# Patient Record
Sex: Female | Born: 1935 | Race: White | Hispanic: No | Marital: Married | Smoking: Never smoker
Health system: Southern US, Community
[De-identification: ages and names within clinical notes are randomized; demographics above are authoritative.]

## PROBLEM LIST (undated history)

## (undated) DIAGNOSIS — M545 Low back pain, unspecified: Secondary | ICD-10-CM

## (undated) DIAGNOSIS — D649 Anemia, unspecified: Secondary | ICD-10-CM

## (undated) DIAGNOSIS — I10 Essential (primary) hypertension: Secondary | ICD-10-CM

## (undated) DIAGNOSIS — F32A Depression, unspecified: Secondary | ICD-10-CM

## (undated) DIAGNOSIS — M1990 Unspecified osteoarthritis, unspecified site: Secondary | ICD-10-CM

## (undated) DIAGNOSIS — F419 Anxiety disorder, unspecified: Secondary | ICD-10-CM

## (undated) DIAGNOSIS — Z9289 Personal history of other medical treatment: Secondary | ICD-10-CM

## (undated) DIAGNOSIS — G8929 Other chronic pain: Secondary | ICD-10-CM

## (undated) DIAGNOSIS — K219 Gastro-esophageal reflux disease without esophagitis: Secondary | ICD-10-CM

## (undated) DIAGNOSIS — IMO0001 Reserved for inherently not codable concepts without codable children: Secondary | ICD-10-CM

## (undated) DIAGNOSIS — F329 Major depressive disorder, single episode, unspecified: Secondary | ICD-10-CM

## (undated) DIAGNOSIS — C801 Malignant (primary) neoplasm, unspecified: Secondary | ICD-10-CM

## (undated) HISTORY — PX: DILATION AND CURETTAGE OF UTERUS: SHX78

## (undated) HISTORY — PX: TONSILLECTOMY: SUR1361

## (undated) HISTORY — PX: JOINT REPLACEMENT: SHX530

## (undated) HISTORY — PX: OTHER SURGICAL HISTORY: SHX169

## (undated) HISTORY — PX: CATARACT EXTRACTION W/ INTRAOCULAR LENS  IMPLANT, BILATERAL: SHX1307

---

## 1998-11-15 ENCOUNTER — Other Ambulatory Visit: Admission: RE | Admit: 1998-11-15 | Discharge: 1998-11-15 | Payer: Self-pay | Admitting: Obstetrics and Gynecology

## 1999-04-26 ENCOUNTER — Ambulatory Visit (HOSPITAL_COMMUNITY): Admission: RE | Admit: 1999-04-26 | Discharge: 1999-04-26 | Payer: Self-pay | Admitting: Obstetrics and Gynecology

## 2000-03-13 ENCOUNTER — Encounter: Payer: Self-pay | Admitting: Obstetrics and Gynecology

## 2000-03-13 ENCOUNTER — Encounter: Admission: RE | Admit: 2000-03-13 | Discharge: 2000-03-13 | Payer: Self-pay | Admitting: Obstetrics and Gynecology

## 2003-04-05 ENCOUNTER — Encounter: Payer: Self-pay | Admitting: Emergency Medicine

## 2003-04-05 ENCOUNTER — Emergency Department (HOSPITAL_COMMUNITY): Admission: EM | Admit: 2003-04-05 | Discharge: 2003-04-05 | Payer: Self-pay | Admitting: Emergency Medicine

## 2003-05-30 ENCOUNTER — Encounter (INDEPENDENT_AMBULATORY_CARE_PROVIDER_SITE_OTHER): Payer: Self-pay | Admitting: Specialist

## 2003-05-30 ENCOUNTER — Ambulatory Visit (HOSPITAL_COMMUNITY): Admission: RE | Admit: 2003-05-30 | Discharge: 2003-05-30 | Payer: Self-pay | Admitting: Gastroenterology

## 2003-10-18 ENCOUNTER — Ambulatory Visit (HOSPITAL_COMMUNITY): Admission: RE | Admit: 2003-10-18 | Discharge: 2003-10-18 | Payer: Self-pay | Admitting: *Deleted

## 2004-06-13 ENCOUNTER — Encounter
Admission: RE | Admit: 2004-06-13 | Discharge: 2004-06-28 | Payer: Self-pay | Admitting: Physical Medicine and Rehabilitation

## 2007-07-06 ENCOUNTER — Observation Stay (HOSPITAL_COMMUNITY): Admission: EM | Admit: 2007-07-06 | Discharge: 2007-07-07 | Payer: Self-pay | Admitting: Emergency Medicine

## 2009-05-28 ENCOUNTER — Emergency Department (HOSPITAL_COMMUNITY): Admission: EM | Admit: 2009-05-28 | Discharge: 2009-05-28 | Payer: Self-pay | Admitting: Emergency Medicine

## 2009-12-15 ENCOUNTER — Encounter: Admission: RE | Admit: 2009-12-15 | Discharge: 2009-12-15 | Payer: Self-pay | Admitting: Orthopedic Surgery

## 2010-04-17 ENCOUNTER — Encounter: Admission: RE | Admit: 2010-04-17 | Discharge: 2010-04-17 | Payer: Self-pay | Admitting: Internal Medicine

## 2010-11-06 NOTE — H&P (Signed)
NAME:  Alisha, [name]            ACCOUNT NO.:  [id number]   MEDICAL RECORD NO.:  [id number]          PATIENT TYPE:  OBV   LOCATION:  4712                         FACILITY:  [facility]   PHYSICIAN:  Corinna L. Lendell Caprice, MDDATE OF BIRTH:  [birth year]   DATE OF ADMISSION:  07/06/2007  DATE OF DISCHARGE:                              HISTORY & PHYSICAL   CHIEF COMPLAINT:  Fainted.   HISTORY OF PRESENT ILLNESS:  Ms. Scott is a pleasant, 75 year old,  white female who was at church this morning when she began feeling  diaphoretic, hot and she started hyperventilating.  She was lightheaded  and then apparently someone got her a chair and she slumped down.  The  other church members assisted her to the ground.  She had no chest pain  or palpitations.  She does not have any dysuria, but she does have  urinary frequency.  Her appetite has been poor.  She has recently been  recovering from an upper respiratory infection.  No diarrhea recently,  although she did have a gastrointestinal virus about a month ago.  She  has had no vomiting.   PAST MEDICAL HISTORY:  1. Gastroesophageal reflux disease.  2. Depression.   SOCIAL HISTORY:  She is here with her husband.  No drinking or smoking  history.   FAMILY HISTORY:  Noncontributory.   REVIEW OF SYSTEMS:  As above, otherwise negative.   PHYSICAL EXAMINATION:  VITAL SIGNS:  Temperature 97.5, blood pressure  131/61, pulse 80, respiratory rate 22, oxygen saturation 96% on room  air.  GENERAL:  The patient is well-nourished, well-developed in no acute  distress.  HEENT:  Normocephalic, atraumatic.  Pupils equal, round, reactive to  light.  Sclerae nonicteric.  Moist mucous membranes.  NECK:  Supple.  No carotid bruits.  No thyromegaly.  LUNGS:  Clear to auscultation bilaterally without wheezes, rhonchi or  rales.  CARDIOVASCULAR:  Regular rate and rhythm without murmurs, gallops or  rubs.  ABDOMEN:  Normal bowel sounds, soft.  She has some  suprapubic  discomfort, although no tenderness to palpation.  GU/RECTAL:  Deferred.  EXTREMITIES:  No clubbing, cyanosis or edema.  SKIN:  No rash.  PSYCHIATRIC:  Normal affect.  NEUROLOGIC:  Alert and oriented.  Cranial nerves and sensory and motor  exams are intact.   LABORATORY DATA AND X-RAY FINDINGS:  UA is cloudy, negative blood,  negative ketones, negative nitrite, large leukocyte esterase, few  squamous epithelial cells, too numerous to count white cells, 3-6 red  cells, few bacteria.  Basic metabolic panel significant for a glucose of  154 and a creatinine of 1.21.  CBC is unremarkable.   EKG shows normal sinus rhythm.   ASSESSMENT/PLAN:  1. Syncope, suspect vasovagal, however, I will place the patient on 23-      hour observation telemetry, give intravenous fluids.  It sounds as      if she has not been eating or drinking well recently.  2. Urinary tract infection.  She will get Cipro.  3. Depression.  Continue outpatient medications.  4. Gastroesophageal reflux disease.  5..  Hyperglycemia.  No history of  diabetes.  I will check CBGs and  hemoglobin A1c.      Corinna L. Lendell Caprice, MD  Electronically Signed     CLS/MEDQ  D:  07/06/2007  T:  07/07/2007  Job:  [id number]

## 2010-11-09 NOTE — Op Note (Signed)
NAME:  [name], [name] NO.:  [id number]   MEDICAL RECORD NO.:  [id number]                   PATIENT TYPE:  AMB   LOCATION:  ENDO                                 FACILITY:  [facility]   PHYSICIAN:  Danise Edge, M.D.                DATE OF BIRTH:  Dec 14, 1935   DATE OF PROCEDURE:  05/30/2003  DATE OF DISCHARGE:                                 OPERATIVE REPORT   PROCEDURE:  Screening colonoscopy.   PROCEDURE INDICATION:  [name] is a 75 year old female born  1935-10-07.  [name] is scheduled to undergo her first screening  colonoscopy with polypectomy to prevent colon cancer.   ENDOSCOPIST:  Danise Edge, M.D.   PREMEDICATION:  Versed 10 mg, Demerol 70 mg.   DESCRIPTION OF PROCEDURE:  After obtaining informed consent, [name]  was placed in the left lateral decubitus position.  I administered  intravenous Demerol and intravenous Versed to achieve conscious sedation for  the procedure.  The patient's blood pressure, oxygen saturation, and cardiac  rhythm were monitored throughout the procedure and documented in the medical  record.   Anal inspection was normal.  Digital rectal exam was normal.  The Olympus  adult colonoscope was introduced into the rectum and at 70 cm I was unable  to advance the endoscope more proximally due to colonic loop formation.  The  adult colonoscope was removed and the pediatric colonoscope inserted into  the rectum and advanced to the cecum.  Colonic preparation for the exam  today is satisfactory.   Rectum normal.   Sigmoid colon and descending colon:  Scattered left colonic diverticulosis.  A 1 mm sessile polyp was removed with cold biopsy forceps at 40 cm and a 1  mm sessile polyp was removed with cold biopsy forceps at 60 cm.   Splenic flexure normal.   Transverse colon normal.   Hepatic flexure normal.   Ascending colon normal.   Cecum and ileocecal valve normal.    ASSESSMENT:  1. Left colonic diverticulosis.  2. Two diminutive polyps were removed from the left colon with the cold     biopsy forceps.   RECOMMENDATIONS:  Repeat colonoscopy in five years if polyps return  neoplastic pathologically.                                               Danise Edge, M.D.    MJ/MEDQ  D:  05/30/2003  T:  05/30/2003  Job:  [id number]   cc:   [name], M.D.  [address] [place]  [place] [address]  Fax: [phone]

## 2011-03-13 LAB — URINE MICROSCOPIC-ADD ON

## 2011-03-13 LAB — CK TOTAL AND CKMB (NOT AT ARMC): Relative Index: INVALID

## 2011-03-13 LAB — DIFFERENTIAL
Basophils Relative: 0
Eosinophils Absolute: 0.1
Eosinophils Relative: 1
Lymphs Abs: 1.5
Monocytes Absolute: 0.5
Monocytes Relative: 6
Neutrophils Relative %: 76

## 2011-03-13 LAB — URINALYSIS, ROUTINE W REFLEX MICROSCOPIC
Bilirubin Urine: NEGATIVE
Glucose, UA: NEGATIVE
Hgb urine dipstick: NEGATIVE
Specific Gravity, Urine: 1.015
pH: 7

## 2011-03-13 LAB — BASIC METABOLIC PANEL
BUN: 15
CO2: 27
Calcium: 9
Chloride: 102
Creatinine, Ser: 1.21 — ABNORMAL HIGH
GFR calc Af Amer: 53 — ABNORMAL LOW
GFR calc non Af Amer: 44 — ABNORMAL LOW
Glucose, Bld: 154 — ABNORMAL HIGH
Potassium: 4.1
Sodium: 138

## 2011-03-13 LAB — HEMOGLOBIN A1C: Hgb A1c MFr Bld: 5.8

## 2011-03-13 LAB — CBC
HCT: 35.5 — ABNORMAL LOW
Hemoglobin: 12.1
MCHC: 34
MCV: 87.8
Platelets: 202
RBC: 4.04
RDW: 15.4
WBC: 8.6

## 2011-03-13 LAB — URINE CULTURE

## 2011-09-04 ENCOUNTER — Encounter (HOSPITAL_COMMUNITY): Payer: Self-pay | Admitting: Pharmacy Technician

## 2011-09-06 ENCOUNTER — Other Ambulatory Visit: Payer: Self-pay

## 2011-09-06 ENCOUNTER — Encounter (HOSPITAL_COMMUNITY)
Admission: RE | Admit: 2011-09-06 | Discharge: 2011-09-06 | Disposition: A | Discharge status: Home / Self Care | Payer: Medicare Other | Source: Ambulatory Visit | Attending: Anesthesiology | Admitting: Anesthesiology

## 2011-09-06 ENCOUNTER — Encounter (HOSPITAL_COMMUNITY): Payer: Self-pay

## 2011-09-06 ENCOUNTER — Encounter (HOSPITAL_COMMUNITY)
Admission: RE | Admit: 2011-09-06 | Discharge: 2011-09-06 | Disposition: A | Discharge status: Home / Self Care | Payer: Medicare Other | Source: Ambulatory Visit | Attending: Orthopedic Surgery | Admitting: Orthopedic Surgery

## 2011-09-06 HISTORY — DX: Depression, unspecified: F32.A

## 2011-09-06 HISTORY — DX: Gastro-esophageal reflux disease without esophagitis: K21.9

## 2011-09-06 HISTORY — DX: Major depressive disorder, single episode, unspecified: F32.9

## 2011-09-06 HISTORY — DX: Essential (primary) hypertension: I10

## 2011-09-06 LAB — BASIC METABOLIC PANEL
Chloride: 100 mEq/L (ref 96–112)
Creatinine, Ser: 0.94 mg/dL (ref 0.50–1.10)
GFR calc Af Amer: 68 mL/min — ABNORMAL LOW (ref >90)
GFR calc non Af Amer: 59 mL/min — ABNORMAL LOW (ref >90)
Potassium: 3.4 mEq/L — ABNORMAL LOW (ref 3.5–5.1)

## 2011-09-06 LAB — TYPE AND SCREEN
ABO/RH(D): O POS
Antibody Screen: NEGATIVE

## 2011-09-06 LAB — CBC
HCT: 38.7 % (ref 36.0–46.0)
Hemoglobin: 12.3 g/dL (ref 12.0–15.0)
RDW: 16.3 % — ABNORMAL HIGH (ref 11.5–15.5)
WBC: 7.9 10*3/uL (ref 4.0–10.5)

## 2011-09-06 LAB — ABO/RH: ABO/RH(D): O POS

## 2011-09-06 NOTE — Progress Notes (Signed)
Requested ekg dr Earl Gala.anesthesia see ekg.

## 2011-09-06 NOTE — Progress Notes (Signed)
Anesthesia please review EKG

## 2011-09-06 NOTE — Pre-Procedure Instructions (Signed)
20 [name]  09/06/2011   Your procedure is scheduled on:  MARCH 22  Report to [facility] at 0930 AM.  Call this number if you have problems the morning of surgery: [phone]   Remember:   Do not eat food:After Midnight.  May have clear liquids: up to 4 Hours before arrival.  Clear liquids include soda, tea, black coffee, apple or grape juice, broth.  Take these medicines the morning of surgery with A SIP OF WATER: PROTONIX,VENLAFAXINE   Do not wear jewelry, make-up or nail polish.  Do not wear lotions, powders, or perfumes. You may wear deodorant.  Do not shave 48 hours prior to surgery.  Do not bring valuables to the hospital.  Contacts, dentures or bridgework may not be worn into surgery.  Leave suitcase in the car. After surgery it may be brought to your room.  For patients admitted to the hospital, checkout time is 11:00 AM the day of discharge.   Patients discharged the day of surgery will not be allowed to drive home.  Name and phone number of your driver: SPOUSE  Special Instructions: CHG Shower Use Special Wash: 1/2 bottle night before surgery and 1/2 bottle morning of surgery.   Please read over the following fact sheets that you were given: Pain Booklet, Coughing and Deep Breathing, Blood Transfusion Information, MRSA Information and Surgical Site Infection Prevention

## 2011-09-09 NOTE — Consult Note (Signed)
Anesthesia:  Patient is a 76 year old female scheduled for left reverse total shoulder on 09/13/11. History includes non-smoker, GERD, depression, HTN.  Her PCP is Dr. Earl Gala with [facility].  Labs acceptable.  CXR shows no evidence of acute cardiopulmonary disease.  EKG shows NSR, non-specific ST abnormality (inferior and V4-6 leads), short PR with no delta waves seen.  Motion is noted in V4-V6.  She now has a Q wave in lead III but otherwise I think her inferior leads look similar to EKG on 12/18/99.  No CV symptoms were reported at PAT.  If remains asymptomatic, then anticipate she can proceed.

## 2011-09-11 NOTE — H&P (Signed)
CC: left shoulder pain HPI:  75  Y/o female    With worsening left shoulder pain secondary to rotator cuff arthropathy. Patient has elected to have a reverse total shoulder arthroplasty to decrease pain and increase function. PMH:hypertension, GERD, hyperlipidemia, depression Family History: coronary heart disease Social: Dr. Osborne, no smoking, no drinking, no drugs Meds:aspirin 81, pantoprazole 40mg, HCTZ 25, venlafaxine 225mg Allergies: codeine tetanus ROS: Pain with rom of left shoulder, urinary incontinence otherwise negative Vitals: 82 16 166/82 PE: Alert and appropriate    In no acute distress Cranial 2-12 grossly intact Cervical spine full rom without pain Lungs: bilateral breath sounds with no wheeze rhonchi or rales Heart: regular rate and rhythm with no murmur Abd: nontender nondistended with active bowel sounds Ext: moderate pain with rom of left shoulder, weakness 3.5/5 with ER and IR, neurovascularly intact distally. No pedal edema Skin: no rashes X-rays: rotator cuff arthropathy left shoulder A/P left shoulder rotator cuff arthropathy Plan: reverse total shoulder arthroplasty to decrease pain and increase function.  

## 2011-09-12 MED ORDER — CEFAZOLIN SODIUM-DEXTROSE 2-3 GM-% IV SOLR
2.0000 g | INTRAVENOUS | Status: AC
  Administered 2011-09-13: 2 g via INTRAVENOUS
  Filled 2011-09-12: qty 50

## 2011-09-13 ENCOUNTER — Ambulatory Visit (HOSPITAL_COMMUNITY): Payer: Medicare Other

## 2011-09-13 ENCOUNTER — Encounter (HOSPITAL_COMMUNITY)
Admission: RE | Disposition: A | Discharge status: Home Health | Payer: Self-pay | Source: Ambulatory Visit | Attending: Orthopedic Surgery

## 2011-09-13 ENCOUNTER — Ambulatory Visit (HOSPITAL_COMMUNITY): Payer: Medicare Other | Admitting: Vascular Surgery

## 2011-09-13 ENCOUNTER — Encounter (HOSPITAL_COMMUNITY): Payer: Self-pay | Admitting: Surgery

## 2011-09-13 ENCOUNTER — Inpatient Hospital Stay (HOSPITAL_COMMUNITY)
Admission: RE | Admit: 2011-09-13 | Discharge: 2011-09-15 | DRG: 484 | Disposition: A | Discharge status: Home Health | Payer: Medicare Other | Source: Ambulatory Visit | Attending: Orthopedic Surgery | Admitting: Orthopedic Surgery

## 2011-09-13 ENCOUNTER — Encounter (HOSPITAL_COMMUNITY): Payer: Self-pay | Admitting: Vascular Surgery

## 2011-09-13 DIAGNOSIS — M12819 Other specific arthropathies, not elsewhere classified, unspecified shoulder: Secondary | ICD-10-CM

## 2011-09-13 DIAGNOSIS — E785 Hyperlipidemia, unspecified: Secondary | ICD-10-CM | POA: Diagnosis present

## 2011-09-13 DIAGNOSIS — M719 Bursopathy, unspecified: Secondary | ICD-10-CM | POA: Diagnosis present

## 2011-09-13 DIAGNOSIS — M19019 Primary osteoarthritis, unspecified shoulder: Principal | ICD-10-CM | POA: Diagnosis present

## 2011-09-13 DIAGNOSIS — M67919 Unspecified disorder of synovium and tendon, unspecified shoulder: Secondary | ICD-10-CM | POA: Diagnosis present

## 2011-09-13 DIAGNOSIS — Z01812 Encounter for preprocedural laboratory examination: Secondary | ICD-10-CM

## 2011-09-13 HISTORY — PX: TOTAL SHOULDER ARTHROPLASTY: SHX126

## 2011-09-13 SURGERY — ARTHROPLASTY, SHOULDER, TOTAL
Anesthesia: Regional | Site: Shoulder | Laterality: Left

## 2011-09-13 MED ORDER — BUPIVACAINE-EPINEPHRINE 0.25% -1:200000 IJ SOLN
INTRAMUSCULAR | Status: DC | PRN
  Administered 2011-09-13: 5 mL

## 2011-09-13 MED ORDER — VENLAFAXINE HCL ER 225 MG PO TB24: 1.0000 | ORAL_TABLET | Freq: Every day | ORAL | Status: DC

## 2011-09-13 MED ORDER — PROPOFOL 10 MG/ML IV EMUL
INTRAVENOUS | Status: DC | PRN
  Administered 2011-09-13: 150 mg via INTRAVENOUS

## 2011-09-13 MED ORDER — GLYCOPYRROLATE 0.2 MG/ML IJ SOLN
INTRAMUSCULAR | Status: DC | PRN
  Administered 2011-09-13: .8 mg via INTRAVENOUS

## 2011-09-13 MED ORDER — ONDANSETRON HCL 4 MG/2ML IJ SOLN
INTRAMUSCULAR | Status: DC | PRN
  Administered 2011-09-13: 4 mg via INTRAVENOUS

## 2011-09-13 MED ORDER — ONDANSETRON HCL 4 MG/2ML IJ SOLN: 4.0000 mg | Freq: Four times a day (QID) | INTRAMUSCULAR | Status: DC | PRN

## 2011-09-13 MED ORDER — BUPIVACAINE-EPINEPHRINE PF 0.5-1:200000 % IJ SOLN
INTRAMUSCULAR | Status: DC | PRN
  Administered 2011-09-13: 150 mg

## 2011-09-13 MED ORDER — NEOSTIGMINE METHYLSULFATE 1 MG/ML IJ SOLN
INTRAMUSCULAR | Status: DC | PRN
  Administered 2011-09-13: 4 mg via INTRAVENOUS

## 2011-09-13 MED ORDER — HYDROCODONE-ACETAMINOPHEN 7.5-325 MG PO TABS
1.0000 | ORAL_TABLET | Freq: Four times a day (QID) | ORAL | Status: DC
  Administered 2011-09-13 – 2011-09-15 (×7): 1 via ORAL
  Filled 2011-09-13: qty 2
  Filled 2011-09-13 (×5): qty 1

## 2011-09-13 MED ORDER — ASPIRIN EC 81 MG PO TBEC
81.0000 mg | DELAYED_RELEASE_TABLET | Freq: Every day | ORAL | Status: DC
  Administered 2011-09-13 – 2011-09-15 (×3): 81 mg via ORAL
  Filled 2011-09-13 (×3): qty 1

## 2011-09-13 MED ORDER — MIDAZOLAM HCL 5 MG/5ML IJ SOLN
INTRAMUSCULAR | Status: DC | PRN
  Administered 2011-09-13: 2 mg via INTRAVENOUS

## 2011-09-13 MED ORDER — ACETAMINOPHEN 325 MG PO TABS: 650.0000 mg | ORAL_TABLET | Freq: Four times a day (QID) | ORAL | Status: DC | PRN

## 2011-09-13 MED ORDER — DROPERIDOL 2.5 MG/ML IJ SOLN: 0.6250 mg | INTRAMUSCULAR | Status: DC | PRN

## 2011-09-13 MED ORDER — METHOCARBAMOL 500 MG PO TABS
500.0000 mg | ORAL_TABLET | Freq: Four times a day (QID) | ORAL | Status: DC | PRN
  Administered 2011-09-15: 500 mg via ORAL
  Filled 2011-09-13: qty 1

## 2011-09-13 MED ORDER — HYDROCODONE-ACETAMINOPHEN 7.5-325 MG PO TABS: 1.0000 | ORAL_TABLET | Freq: Four times a day (QID) | ORAL | Status: AC | PRN

## 2011-09-13 MED ORDER — VECURONIUM BROMIDE 10 MG IV SOLR
INTRAVENOUS | Status: DC | PRN
  Administered 2011-09-13: 7 mg via INTRAVENOUS

## 2011-09-13 MED ORDER — PHENOL 1.4 % MT LIQD: 1.0000 | OROMUCOSAL | Status: DC | PRN

## 2011-09-13 MED ORDER — CEFAZOLIN SODIUM-DEXTROSE 2-3 GM-% IV SOLR
2.0000 g | Freq: Four times a day (QID) | INTRAVENOUS | Status: AC
  Administered 2011-09-13 – 2011-09-14 (×3): 2 g via INTRAVENOUS
  Filled 2011-09-13 (×3): qty 50

## 2011-09-13 MED ORDER — EPHEDRINE SULFATE 50 MG/ML IJ SOLN
INTRAMUSCULAR | Status: DC | PRN
  Administered 2011-09-13: 5 mg via INTRAVENOUS

## 2011-09-13 MED ORDER — ACETAMINOPHEN 650 MG RE SUPP: 650.0000 mg | Freq: Four times a day (QID) | RECTAL | Status: DC | PRN

## 2011-09-13 MED ORDER — HYDROMORPHONE HCL PF 1 MG/ML IJ SOLN: 0.5000 mg | INTRAMUSCULAR | Status: DC | PRN

## 2011-09-13 MED ORDER — CHLORHEXIDINE GLUCONATE 4 % EX LIQD: 60.0000 mL | Freq: Once | CUTANEOUS | Status: DC

## 2011-09-13 MED ORDER — PANTOPRAZOLE SODIUM 40 MG PO TBEC
40.0000 mg | DELAYED_RELEASE_TABLET | Freq: Every day | ORAL | Status: DC
  Administered 2011-09-14 – 2011-09-15 (×2): 40 mg via ORAL
  Filled 2011-09-13 (×2): qty 1

## 2011-09-13 MED ORDER — ONDANSETRON HCL 4 MG PO TABS: 4.0000 mg | ORAL_TABLET | Freq: Four times a day (QID) | ORAL | Status: DC | PRN

## 2011-09-13 MED ORDER — HYDROCHLOROTHIAZIDE 25 MG PO TABS
25.0000 mg | ORAL_TABLET | Freq: Every day | ORAL | Status: DC
  Administered 2011-09-14 – 2011-09-15 (×2): 25 mg via ORAL
  Filled 2011-09-13 (×3): qty 1

## 2011-09-13 MED ORDER — LACTATED RINGERS IV SOLN
INTRAVENOUS | Status: DC
  Administered 2011-09-13: 10:00:00 via INTRAVENOUS

## 2011-09-13 MED ORDER — METOCLOPRAMIDE HCL 5 MG PO TABS
5.0000 mg | ORAL_TABLET | Freq: Three times a day (TID) | ORAL | Status: DC | PRN
  Filled 2011-09-13: qty 2

## 2011-09-13 MED ORDER — FENTANYL CITRATE 0.05 MG/ML IJ SOLN
INTRAMUSCULAR | Status: DC | PRN
  Administered 2011-09-13 (×2): 100 ug via INTRAVENOUS

## 2011-09-13 MED ORDER — PHENYLEPHRINE HCL 10 MG/ML IJ SOLN
10.0000 mg | INTRAVENOUS | Status: DC | PRN
  Administered 2011-09-13: 10 ug/min via INTRAVENOUS

## 2011-09-13 MED ORDER — MENTHOL 3 MG MT LOZG: 1.0000 | LOZENGE | OROMUCOSAL | Status: DC | PRN

## 2011-09-13 MED ORDER — VENLAFAXINE HCL ER 75 MG PO CP24
225.0000 mg | ORAL_CAPSULE | Freq: Every day | ORAL | Status: DC
  Administered 2011-09-14 – 2011-09-15 (×2): 225 mg via ORAL
  Filled 2011-09-13 (×3): qty 1

## 2011-09-13 MED ORDER — METHOCARBAMOL 500 MG PO TABS: 500.0000 mg | ORAL_TABLET | Freq: Three times a day (TID) | ORAL | Status: AC | PRN

## 2011-09-13 MED ORDER — METOCLOPRAMIDE HCL 5 MG/ML IJ SOLN: 5.0000 mg | Freq: Three times a day (TID) | INTRAMUSCULAR | Status: DC | PRN

## 2011-09-13 MED ORDER — HYDROMORPHONE HCL PF 1 MG/ML IJ SOLN: 0.2500 mg | INTRAMUSCULAR | Status: DC | PRN

## 2011-09-13 MED ORDER — METHOCARBAMOL 100 MG/ML IJ SOLN
500.0000 mg | Freq: Four times a day (QID) | INTRAVENOUS | Status: DC | PRN
  Filled 2011-09-13: qty 5

## 2011-09-13 MED ORDER — SODIUM CHLORIDE 0.9 % IV SOLN: INTRAVENOUS | Status: DC

## 2011-09-13 MED ORDER — SODIUM CHLORIDE 0.9 % IR SOLN
Status: DC | PRN
  Administered 2011-09-13: 1000 mL

## 2011-09-13 MED ORDER — POTASSIUM CHLORIDE IN NACL 20-0.9 MEQ/L-% IV SOLN
INTRAVENOUS | Status: DC
  Administered 2011-09-13: 15:00:00 via INTRAVENOUS
  Filled 2011-09-13 (×6): qty 1000

## 2011-09-13 MED ORDER — LACTATED RINGERS IV SOLN
INTRAVENOUS | Status: DC | PRN
  Administered 2011-09-13 (×2): via INTRAVENOUS

## 2011-09-13 SURGICAL SUPPLY — 71 items
BIT DRILL 170X2.5X (BIT) IMPLANT
BIT DRL 170X2.5X (BIT)
BLADE SAW SAG 73X25 THK (BLADE) ×1
BLADE SAW SGTL 73X25 THK (BLADE) ×1 IMPLANT
BUR SURG 4X8 MED (BURR) IMPLANT
BURR SURG 4X8 MED (BURR)
CLOTH BEACON ORANGE TIMEOUT ST (SAFETY) ×2 IMPLANT
CLSR STERI-STRIP ANTIMIC 1/2X4 (GAUZE/BANDAGES/DRESSINGS) ×1 IMPLANT
COVER SURGICAL LIGHT HANDLE (MISCELLANEOUS) ×2 IMPLANT
DRAPE INCISE IOBAN 66X45 STRL (DRAPES) ×2 IMPLANT
DRAPE U-SHAPE 47X51 STRL (DRAPES) ×2 IMPLANT
DRAPE X-RAY CASS 24X20 (DRAPES) IMPLANT
DRILL 2.5 (BIT)
DRILL BIT 5/64 (BIT) ×2 IMPLANT
DRSG ADAPTIC 3X8 NADH LF (GAUZE/BANDAGES/DRESSINGS) ×1 IMPLANT
DRSG PAD ABDOMINAL 8X10 ST (GAUZE/BANDAGES/DRESSINGS) ×2 IMPLANT
DURAPREP 26ML APPLICATOR (WOUND CARE) ×2 IMPLANT
ELECT BLADE 4.0 EZ CLEAN MEGAD (MISCELLANEOUS) ×2
ELECT NEEDLE TIP 2.8 STRL (NEEDLE) ×2 IMPLANT
ELECT REM PT RETURN 9FT ADLT (ELECTROSURGICAL) ×2
ELECTRODE BLDE 4.0 EZ CLN MEGD (MISCELLANEOUS) ×1 IMPLANT
ELECTRODE REM PT RTRN 9FT ADLT (ELECTROSURGICAL) ×1 IMPLANT
GLOVE BIOGEL PI IND STRL 8 (GLOVE) ×2 IMPLANT
GLOVE BIOGEL PI INDICATOR 8 (GLOVE)
GLOVE BIOGEL PI ORTHO PRO SZ8 (GLOVE) ×2
GLOVE ORTHO TXT STRL SZ7.5 (GLOVE) ×2 IMPLANT
GLOVE PI ORTHO PRO STRL SZ8 (GLOVE) ×1 IMPLANT
GLOVE SURG ORTHO 8.0 STRL STRW (GLOVE) ×2 IMPLANT
GOWN STRL NON-REIN LRG LVL3 (GOWN DISPOSABLE) ×2 IMPLANT
GOWN STRL REIN XL XLG (GOWN DISPOSABLE) ×6 IMPLANT
HANDPIECE INTERPULSE COAX TIP (DISPOSABLE)
KIT BASIN OR (CUSTOM PROCEDURE TRAY) ×2 IMPLANT
KIT ROOM TURNOVER OR (KITS) ×2 IMPLANT
MANIFOLD NEPTUNE II (INSTRUMENTS) ×2 IMPLANT
NDL 1/2 CIR MAYO (NEEDLE) ×1 IMPLANT
NDL SUT 6 .5 CRC .975X.05 MAYO (NEEDLE) ×1 IMPLANT
NEEDLE 1/2 CIR MAYO (NEEDLE) IMPLANT
NEEDLE HYPO 25GX1X1/2 BEV (NEEDLE) ×2 IMPLANT
NEEDLE MAYO TAPER (NEEDLE) ×2
NS IRRIG 1000ML POUR BTL (IV SOLUTION) ×2 IMPLANT
PACK SHOULDER (CUSTOM PROCEDURE TRAY) ×2 IMPLANT
PAD ARMBOARD 7.5X6 YLW CONV (MISCELLANEOUS) ×4 IMPLANT
PIN GUIDE 1.2 (PIN) IMPLANT
PIN GUIDE GLENOPHERE 1.5MX300M (PIN) IMPLANT
PIN METAGLENE 2.5 (PIN) IMPLANT
SET HNDPC FAN SPRY TIP SCT (DISPOSABLE) IMPLANT
SLING ARM IMMOBILIZER LRG (SOFTGOODS) ×2 IMPLANT
SLING ARM IMMOBILIZER MED (SOFTGOODS) IMPLANT
SMARTMIX MINI TOWER (MISCELLANEOUS) ×2
SPONGE GAUZE 4X4 12PLY (GAUZE/BANDAGES/DRESSINGS) ×1 IMPLANT
SPONGE LAP 18X18 X RAY DECT (DISPOSABLE) ×2 IMPLANT
SPONGE LAP 4X18 X RAY DECT (DISPOSABLE) ×2 IMPLANT
SPONGE SURGIFOAM ABS GEL SZ50 (HEMOSTASIS) IMPLANT
STRIP CLOSURE SKIN 1/2X4 (GAUZE/BANDAGES/DRESSINGS) IMPLANT
SUCTION FRAZIER TIP 10 FR DISP (SUCTIONS) ×2 IMPLANT
SUT FIBERWIRE #2 38 T-5 BLUE (SUTURE) ×4
SUT MNCRL AB 4-0 PS2 18 (SUTURE) ×2 IMPLANT
SUT VIC AB 0 CT1 27 (SUTURE) ×2
SUT VIC AB 0 CT1 27XBRD ANBCTR (SUTURE) ×1 IMPLANT
SUT VIC AB 2-0 CT1 27 (SUTURE) ×2
SUT VIC AB 2-0 CT1 TAPERPNT 27 (SUTURE) ×1 IMPLANT
SUT VICRYL AB 2 0 TIES (SUTURE) IMPLANT
SUTURE FIBERWR #2 38 T-5 BLUE (SUTURE) ×2 IMPLANT
SYR CONTROL 10ML LL (SYRINGE) ×2 IMPLANT
TAPE CLOTH SURG 6X10 WHT LF (GAUZE/BANDAGES/DRESSINGS) ×2 IMPLANT
TOWEL OR 17X24 6PK STRL BLUE (TOWEL DISPOSABLE) ×2 IMPLANT
TOWEL OR 17X26 10 PK STRL BLUE (TOWEL DISPOSABLE) ×2 IMPLANT
TOWER SMARTMIX MINI (MISCELLANEOUS) ×1 IMPLANT
TRAY FOLEY CATH 14FR (SET/KITS/TRAYS/PACK) ×1 IMPLANT
WATER STERILE IRR 1000ML POUR (IV SOLUTION) ×2 IMPLANT
YANKAUER SUCT BULB TIP NO VENT (SUCTIONS) IMPLANT

## 2011-09-13 NOTE — Anesthesia Procedure Notes (Addendum)
Anesthesia Regional Block:  Interscalene brachial plexus block  Pre-Anesthetic Checklist: ,, timeout performed, Correct Patient, Correct Site, Correct Laterality, Correct Procedure,, site marked, risks and benefits discussed, Surgical consent,  Pre-op evaluation,  At surgeon's request and post-op pain management  Laterality: Right  Prep: chloraprep       Needles:  Injection technique: Single-shot  Needle Type: Echogenic Stimulator Needle     Needle Length: 5cm 5 cm Needle Gauge: 22 and 22 G    Additional Needles:  Procedures: ultrasound guided and nerve stimulator Interscalene brachial plexus block  Nerve Stimulator or Paresthesia:  Response: bicep contraction, 0.45 mA,   Additional Responses:   Narrative:  Start time: 09/13/2011 10:19 AM End time: 09/13/2011 10:30 AM Injection made incrementally with aspirations every 5 mL.  Performed by: Personally  Anesthesiologist: J. Adonis Huguenin, MD  Additional Notes: Functioning IV was confirmed and monitors applied.  A 50mm 22ga echogenic arrow stimulator was used. Sterile prep and drape,hand hygiene and sterile gloves were used.Ultrasound guidance: relevent anatomy identified, needle position confirmed, local anesthetic spread visualized around nerve(s)., vascular puncture avoided.  Image printed for medical record.  Negative aspiration and negative test dose prior to incremental administration of local anesthetic. The patient tolerated the procedure well.  Interscalene brachial plexus block Procedure Name: Intubation Date/Time: 09/13/2011 11:50 AM Performed by: Carmela Rima Pre-anesthesia Checklist: Emergency Drugs available, Timeout performed, Patient identified, Suction available and Patient being monitored Patient Re-evaluated:Patient Re-evaluated prior to inductionOxygen Delivery Method: Circle system utilized Preoxygenation: Pre-oxygenation with 100% oxygen Intubation Type: IV induction Ventilation: Mask ventilation  without difficulty Grade View: Grade III Tube type: Oral Tube size: 7.5 mm Number of attempts: 1 Placement Confirmation: positive ETCO2 and breath sounds checked- equal and bilateral Secured at: 22 cm Tube secured with: Tape Dental Injury: Teeth and Oropharynx as per pre-operative assessment

## 2011-09-13 NOTE — Interval H&P Note (Signed)
History and Physical Interval Note:  09/13/2011 11:13 AM  [name]  has presented today for surgery, with the diagnosis of LEFT SHOULDER PAIN  The various methods of treatment have been discussed with the patient and family. After consideration of risks, benefits and other options for treatment, the patient has consented to  Procedure(s) (LRB): TOTAL SHOULDER ARTHROPLASTY (Left) as a surgical intervention .  The patients' history has been reviewed, patient examined, no change in status, stable for surgery.  I have reviewed the patients' chart and labs.  Questions were answered to the patient's satisfaction.     Sunny Gains,STEVEN R

## 2011-09-13 NOTE — Preoperative (Signed)
Beta Blockers   Reason not to administer Beta Blockers:Not Applicable 

## 2011-09-13 NOTE — Discharge Summary (Signed)
Physician Discharge Summary  Patient ID: [name] MRN: [id number] DOB/AGE: 1935-06-25 76 y.o.  Admit date: 09/13/2011 Discharge date: 09/15/2011  Admission Diagnoses:  Rotator Cuff tear arthropathy  Discharge Diagnoses:  Same   Surgeries: Procedure(s): LEFT REVERSE TOTAL SHOULDER ARTHROPLASTY on 09/13/2011   Consultants: Occ Therapy  Discharged Condition: Stable  Hospital Course: [name] is an 76 y.o. female who was admitted 09/13/2011 with a chief complaint of left shoulder pain and loss of function, and found to have a diagnosis of rotator cuff tear arthropathy.  They were brought to the operating room on 09/13/2011 and underwent the above named procedures.    The patient had an uncomplicated hospital course and was stable for discharge.  Recent vital signs:  Filed Vitals:   09/13/11 1345  BP:   Pulse:   Temp: 97.5 F (36.4 C)  Resp:     Recent laboratory studies:  Results for orders placed during the hospital encounter of 09/06/11  BASIC METABOLIC PANEL      Component Value Range   Sodium 137  135 - 145 (mEq/L)   Potassium 3.4 (*) 3.5 - 5.1 (mEq/L)   Chloride 100  96 - 112 (mEq/L)   CO2 26  19 - 32 (mEq/L)   Glucose, Bld 90  70 - 99 (mg/dL)   BUN 13  6 - 23 (mg/dL)   Creatinine, Ser 4.09  0.50 - 1.10 (mg/dL)   Calcium 9.2  8.4 - 81.1 (mg/dL)   GFR calc non Af Amer 59 (*) >90 (mL/min)   GFR calc Af Amer 68 (*) >90 (mL/min)  CBC      Component Value Range   WBC 7.9  4.0 - 10.5 (K/uL)   RBC 4.57  3.87 - 5.11 (MIL/uL)   Hemoglobin 12.3  12.0 - 15.0 (g/dL)   HCT 91.4  78.2 - 95.6 (%)   MCV 84.7  78.0 - 100.0 (fL)   MCH 26.9  26.0 - 34.0 (pg)   MCHC 31.8  30.0 - 36.0 (g/dL)   RDW 21.3 (*) 08.6 - 15.5 (%)   Platelets 230  150 - 400 (K/uL)  TYPE AND SCREEN      Component Value Range   ABO/RH(D) O POS     Antibody Screen NEG     Sample Expiration 09/20/2011    SURGICAL PCR SCREEN      Component Value Range   MRSA, PCR NEGATIVE  NEGATIVE      Staphylococcus aureus NEGATIVE  NEGATIVE   ABO/RH      Component Value Range   ABO/RH(D) O POS      Discharge Medications:   Medication List  As of 09/13/2011  2:01 PM   TAKE these medications         aspirin EC 81 MG tablet   Take 81 mg by mouth daily.      hydrochlorothiazide 25 MG tablet   Commonly known as: HYDRODIURIL   Take 25 mg by mouth daily.      HYDROcodone-acetaminophen 7.5-325 MG per tablet   Commonly known as: NORCO   Take 1 tablet by mouth every 6 (six) hours as needed for pain.      methocarbamol 500 MG tablet   Commonly known as: ROBAXIN   Take 1 tablet (500 mg total) by mouth 3 (three) times daily as needed.      NONFORMULARY OR COMPOUNDED ITEM   Inject 1 each into the muscle every 30 (thirty) days. Vitamin b-12 shot  pantoprazole 40 MG tablet   Commonly known as: PROTONIX   Take 40 mg by mouth daily.      Venlafaxine HCl 225 MG Tb24   Take 1 tablet by mouth daily.            Diagnostic Studies: Dg Chest 2 View  09/06/2011  *RADIOLOGY REPORT*  Clinical Data: Preop left shoulder arthroplasty  CHEST - 2 VIEW  Comparison: None.  Findings: Lungs are essentially clear.  Presumed osseous densities overlying the right upper lung and left anterior sixth rib.  No pleural effusion or pneumothorax.  Cardiomediastinal silhouette is within normal limits.  Hiatal hernia.  Degenerative changes with scoliosis of the visualized thoracolumbar spine.  IMPRESSION: No evidence of acute cardiopulmonary disease.  Original Report Authenticated By: Charline Bills, M.D.    Disposition: home with home health OT Patient's husband has prescriptions.    Follow-up Information    Follow up with NORRIS,STEVEN R, MD. Call in 2 weeks. [phone])    Contact information:   [facility] [address] [place] [address] [phone]           Signed: Verlee Rossetti 09/13/2011, 2:01 PM   =

## 2011-09-13 NOTE — Anesthesia Preprocedure Evaluation (Addendum)
Anesthesia Evaluation  Patient identified by MRN, date of birth, ID band Patient awake    Reviewed: Allergy & Precautions, H&P , NPO status , Patient's Chart, lab work & pertinent test results  History of Anesthesia Complications Negative for: history of anesthetic complications  Airway Mallampati: II TM Distance: >3 FB Neck ROM: Full    Dental  (+) Teeth Intact and Dental Advisory Given   Pulmonary neg pulmonary ROS,  breath sounds clear to auscultation  Pulmonary exam normal       Cardiovascular hypertension, Pt. on medications Rhythm:Regular Rate:Normal     Neuro/Psych Depression negative neurological ROS     GI/Hepatic Neg liver ROS, GERD-  Controlled,  Endo/Other  negative endocrine ROS  Renal/GU negative Renal ROS     Musculoskeletal   Abdominal   Peds  Hematology   Anesthesia Other Findings   Reproductive/Obstetrics                           Anesthesia Physical Anesthesia Plan  ASA: III  Anesthesia Plan: General   Post-op Pain Management:    Induction:   Airway Management Planned: Oral ETT  Additional Equipment:   Intra-op Plan:   Post-operative Plan:   Informed Consent: I have reviewed the patients History and Physical, chart, labs and discussed the procedure including the risks, benefits and alternatives for the proposed anesthesia with the patient or authorized representative who has indicated his/her understanding and acceptance.   Dental advisory given  Plan Discussed with: CRNA, Anesthesiologist and Surgeon  Anesthesia Plan Comments:         Anesthesia Quick Evaluation

## 2011-09-13 NOTE — Discharge Instructions (Signed)
Ice left shoulder constantly. Do exercises 6 times per day.  Keep dressing on until Sunday, then change to bandaids. May get wet in the shower on Wednesday next week.  Limit Weight bearing with the left arm.  Follow up in 2 weeks  Use sling out of home, hug pillow in the house.

## 2011-09-13 NOTE — Transfer of Care (Signed)
Immediate Anesthesia Transfer of Care Note  Patient: [name]  Procedure(s) Performed: Procedure(s) (LRB): TOTAL SHOULDER ARTHROPLASTY (Left)  Patient Location: PACU  Anesthesia Type: General  Level of Consciousness: awake, alert  and oriented  Airway & Oxygen Therapy: Patient Spontanous Breathing and Patient connected to nasal cannula oxygen  Post-op Assessment: Report given to PACU RN and Post -op Vital signs reviewed and stable  Post vital signs: Reviewed and stable  Complications: No apparent anesthesia complications

## 2011-09-13 NOTE — Op Note (Signed)
NAMEALEXICA, [name]            ACCOUNT NO.:  [id number]  MEDICAL RECORD NO.:  [id number]  LOCATION:  [facility]                         FACILITY:  [facility]  PHYSICIAN:  Almedia Balls. Ranell Patrick, M.D. DATE OF BIRTH:  11/02/1935  DATE OF PROCEDURE:  09/13/2011 DATE OF DISCHARGE:                              OPERATIVE REPORT   PREOPERATIVE DIAGNOSIS:  Left shoulder end-stage osteoarthritis with rotator cuff insufficiency.  POSTOPERATIVE DIAGNOSIS:  Left shoulder end-stage osteoarthritis with rotator cuff insufficiency.  PROCEDURE PERFORMED:  Left shoulder reverse total shoulder arthroplasty using DePuy Delta Xtend prosthesis.  ATTENDING SURGEON:  Almedia Balls. Ranell Patrick, MD  ASSISTANT:  Donnie Coffin. [place], [facility]  General anesthesia was used plus interscalene block.  ESTIMATED BLOOD LOSS:  150 mL.  FLUID REPLACEMENT:  1400 mL crystalloid.  INSTRUMENT COUNTS:  Correct.  There were no complications.  Perioperative antibiotics were given.  INDICATIONS:  The patient is a 76 year old female with worsening left shoulder pain, secondary to rotator cuff tear arthropathy.  The patient has had progressive pain despite conservative management and desires operative treatment to restore function limiting pain to her shoulder, informed consent was obtained.  DESCRIPTION OF PROCEDURE:  After an adequate level of anesthesia was achieved, the patient was positioned in the modified beach-chair position.  The left shoulder was correctly identified, time-out called. Left shoulder was sterilely prepped and draped in the usual manner.  All neurovascular structures were padded appropriately.  We used a deltopectoral incision starting at the coracoid process extending down to the anterior humerus.  Dissection down through subcutaneous tissues using Bovie electrocautery.  We identified the cephalic vein and took it laterally with the deltoid.  The pectoralis was taken medially. Conjoint tendon was retracted medially.   We released the subscapularis off the lesser tuberosity, which was mostly attenuated, but there was a little bit of upper subscap that was still intact.  We tagged that for repair of the ends.  We noted there to be a pretty significant rotator cuff insufficiency.  We then went ahead and released the capsule off the inferior humerus.  We progressively externally rotated and delivered the humerus out of the wound.  We entered the proximal humerus using a 6-mm reamer and reamed up to size 10.  We placed our 10-mm intramedullary guide for resection of the head which was resected at 10 degrees of retroversion.  Once we had that resected with the oscillating saw, we broached for a size 1 left humerus with a 10 stem.  We impacted the trial in place, retracted the humerus posteriorly, and then removed the capsule retracting the subscapularis and protecting the axillary nerve. Once the capsule was out, we removed the remaining cartilage off the glenoid face with a Cobb elevator.  We then placed our center guidepin and then reamed for the metaglene.  We then drilled a central PEG hole for the metaglene and impacted that into position and placed 2 locking screws, 1 at the base of the coracoid and 1 inferiorly, these had good purchase with the inferior screw being a 42 and the coracoid base currently being at 24 with good purchase.  We locked those screws in position.  We then placed 18 screws anteriorly and  posteriorly, nonlocked.  Once the baseplate was secured, we placed a 38 standard glenosphere and tightened that into position.  We checked our nerve to make sure it was out of the way.  We then went ahead and reduced the shoulder with a 38+ 3 trial, using a size 10 eccentric epi 1 trial implant in the humerus.  We had nice soft tissue balancing and stable reduction.  Conjoint nicely tensioned, the axillary nerve was not under excessive tension.  Once we were happy with that, we removed  those humeral implants.  We thoroughly irrigated the canal and then we impaction bone grafted using available bone graft.  The size 1 hydroxyapatite coated, 10-mm, epi 1 left stem into position with 10 degrees of retroversion, trialed again with +3 poly, we were happy with that and then placed a real 38 +3 poly with no soft tissue or bony impingement throughout a full range of motion.  We did go and repair subscapularis back to the lesser tuberosity using 0 Vicryl suture and did a little bit of rotator cuff repair as well for what appeared to be supraspinatus and infraspinatus tendon just to see if that would give a little bit of extra function out of the shoulder.  We thoroughly irrigated it, that did not restrict the motion at all, that did not create any problems with nice balance with no gapping with sulcus, no gapping with external rotation.  We then repaired deltopectoral interval with 0 Vicryl suture, followed by 2-0 Vicryl for subcutaneous closure, and 4-0 Monocryl for skin.  Steri-Strips applied, followed by sterile dressing.     Almedia Balls. Ranell Patrick, M.D.     SRN/MEDQ  D:  09/13/2011  T:  09/13/2011  Job:  [id number]

## 2011-09-13 NOTE — Anesthesia Postprocedure Evaluation (Signed)
  Anesthesia Post-op Note  Patient: [name]  Procedure(s) Performed: Procedure(s) (LRB): TOTAL SHOULDER ARTHROPLASTY (Left)  Patient Location: PACU  Anesthesia Type: General  Level of Consciousness: awake, alert  and oriented  Airway and Oxygen Therapy: Patient Spontanous Breathing  Post-op Pain: mild  Post-op Assessment: Post-op Vital signs reviewed and Patient's Cardiovascular Status Stable  Post-op Vital Signs: Reviewed and stable  Complications: No apparent anesthesia complications

## 2011-09-13 NOTE — Brief Op Note (Signed)
09/13/2011  1:24 PM  PATIENT:  [name]  76 y.o. female  PRE-OPERATIVE DIAGNOSIS:  LEFT SHOULDER PAIN, ROTATOR CUFF TEAR ARTHROPATHY  POST-OPERATIVE DIAGNOSIS:  LEFT SHOULDER PAIN, ROTATOR CUFF TEAR ARTHROPATHY  PROCEDURE:  Procedure(s) (LRB): TOTAL SHOULDER ARTHROPLASTY (Left), REVERSE - DEPUY DELTA EXTEND  SURGEON:  Surgeon(s) and Role:    * Verlee Rossetti, MD - Primary  PHYSICIAN ASSISTANT:   ASSISTANTS: Thea Gist, PA-C   ANESTHESIA:   regional and general  EBL:  Total I/O In: 1400 [I.V.:1400] Out: 150 [Blood:150]  BLOOD ADMINISTERED:none  DRAINS: none   LOCAL MEDICATIONS USED:  MARCAINE     SPECIMEN:  No Specimen  DISPOSITION OF SPECIMEN:  N/A  COUNTS:  YES  TOURNIQUET:  * No tourniquets in log *  DICTATION: .Other Dictation: Dictation Number [id number]  PLAN OF CARE: Admit to inpatient   PATIENT DISPOSITION:  PACU - hemodynamically stable.

## 2011-09-14 LAB — GLUCOSE, CAPILLARY: Glucose-Capillary: 101 mg/dL — ABNORMAL HIGH (ref 70–99)

## 2011-09-14 LAB — BASIC METABOLIC PANEL
BUN: 17 mg/dL (ref 6–23)
CO2: 27 mEq/L (ref 19–32)
Chloride: 102 mEq/L (ref 96–112)
GFR calc Af Amer: 67 mL/min — ABNORMAL LOW (ref >90)
Potassium: 3.8 mEq/L (ref 3.5–5.1)

## 2011-09-14 LAB — HEMOGLOBIN AND HEMATOCRIT, BLOOD: Hemoglobin: 9.2 g/dL — ABNORMAL LOW (ref 12.0–15.0)

## 2011-09-14 NOTE — Progress Notes (Signed)
Occupational Therapy Treatment Patient Details Name: [name] MRN: [id number] DOB: [birth year] Today's Date: 09/14/2011  OT Assessment/Plan OT Assessment/Plan Comments on Treatment Session: Pt limited by pain with AAROM so instructed in pendulums. OT Plan: Discharge plan remains appropriate OT Frequency: Min 2X/week Follow Up Recommendations: Home health OT;Supervision - Intermittent Equipment Recommended: None recommended by OT OT Goals Acute Rehab OT Goals OT Goal Formulation: With patient Time For Goal Achievement: 7 days ADL Goals Pt Will Perform Upper Body Bathing: with modified independence;Sitting, edge of bed ADL Goal: Upper Body Bathing - Progress: Progressing toward goals Pt Will Perform Lower Body Bathing: with modified independence;Sitting, edge of bed;Sit to stand from bed ADL Goal: Lower Body Bathing - Progress: Progressing toward goals Pt Will Perform Upper Body Dressing: with modified independence;Sitting, bed ADL Goal: Upper Body Dressing - Progress: Progressing toward goals Pt Will Perform Lower Body Dressing: with modified independence;Sitting, bed;Sit to stand from bed ADL Goal: Lower Body Dressing - Progress: Progressing toward goals Arm Goals Pt Will Perform AROM: 10 reps;to maintain range of motion;Independently Arm Goal: AROM - Progress: Progressing toward goal Additional Arm Goal #1: Pt/Husb will perform AAROM L shoulder within tolerance. Arm Goal: Additional Goal #1 - Progress: Not met Additional Arm Goal #2: Pt/husb will be independent in sling use. Arm Goal: Additional Goal #2 - Progress: Progressing toward goals  OT Treatment Precautions/Restrictions  Precautions Precautions: Shoulder;Fall Precaution Booklet Issued: Yes (comment) Required Braces or Orthoses: Yes Other Brace/Splint: left UE sling in place Sensation Light Touch: Impaired by gross assessment (both lower legs; reports neuropathy) ADL ADL Upper Body Bathing:  Simulated;Minimal assistance Upper Body Bathing Details (indicate cue type and reason): instructed in technique for minimizing movement of L UE Where Assessed - Upper Body Bathing: Sitting, chair Lower Body Bathing: Simulated;Minimal assistance Where Assessed - Lower Body Bathing: Sitting, chair;Sit to stand from chair Upper Body Dressing: Performed;Minimal assistance Upper Body Dressing Details (indicate cue type and reason): instructed to dress L UE first, undress last Where Assessed - Upper Body Dressing: Sitting, chair Lower Body Dressing: Simulated;Minimal assistance Lower Body Dressing Details (indicate cue type and reason): instructed in managing pants and socks one handed Where Assessed - Lower Body Dressing: Sitting, chair;Sit to stand from chair ADL Comments: Pt verbalizing understanding of hemi techniques for ADL.  Husband to assist as needed.  Instructed in proper donning and doffing of sling as well as positioning in bed and chair. Mobility  Bed Mobility Bed Mobility: Yes Supine to Sit: 5: Supervision;HOB elevated (Comment degrees) Supine to Sit Details (indicate cue type and reason): pt. used safe technique up to EOB Transfers Transfers: Yes Sit to Stand: 5: Supervision;With armrests;From chair/3-in-1 Sit to Stand Details (indicate cue type and reason): vc's for safety and stability Stand to Sit: 5: Supervision;To chair/3-in-1;With armrests Stand to Sit Details: min assist to control descent Exercises  Pendulum exercises L shoulder 10 x 2.  AROM x 10 L elbow to hand in standing.  End of Session OT - End of Session Activity Tolerance: Patient limited by pain Patient left: in chair;with call bell in reach;with family/visitor present General Behavior During Session: [facility] for tasks performed Cognition: [facility] for tasks performed  Evern Bio  09/14/2011, 4:44 PM [phone]

## 2011-09-14 NOTE — Evaluation (Signed)
Occupational Therapy Evaluation Patient Details Name: [name] MRN: [id number] DOB: April 30, 1936 Today's Date: 09/14/2011  Problem List:  Patient Active Problem List  Diagnoses  . Rotator cuff arthropathy    Past Medical History:  Past Medical History  Diagnosis Date  . GERD (gastroesophageal reflux disease)   . Left shoulder pain   . Depression   . Hypertension    Past Surgical History:  Past Surgical History  Procedure Date  . Bone spur      r thigh   See hardcopy eval in shadow chart. OT Goals Acute Rehab OT Goals OT Goal Formulation: With patient Time For Goal Achievement: 7 days ADL Goals Pt Will Perform Upper Body Bathing: with modified independence;Sitting, edge of bed ADL Goal: Upper Body Bathing - Progress: Goal set today Pt Will Perform Lower Body Bathing: with modified independence;Sitting, edge of bed;Sit to stand from bed ADL Goal: Lower Body Bathing - Progress: Goal set today Pt Will Perform Upper Body Dressing: with modified independence;Sitting, bed ADL Goal: Upper Body Dressing - Progress: Goal set today Pt Will Perform Lower Body Dressing: with modified independence;Sitting, bed;Sit to stand from bed ADL Goal: Lower Body Dressing - Progress: Goal set today Pt Will Transfer to Toilet: with modified independence;Ambulation;Regular height toilet ADL Goal: Toilet Transfer - Progress: Goal set today Pt Will Perform Toileting - Clothing Manipulation: with modified independence;Standing ADL Goal: Toileting - Clothing Manipulation - Progress: Goal set today Arm Goals Pt Will Perform AROM: 10 reps;to maintain range of motion;Independently (L elbow, forearm, hand) Arm Goal: AROM - Progress: Goal set today Additional Arm Goal #1: Pt/Husb will perform AAROM L shoulder within tolerance. Arm Goal: Additional Goal #1 - Progress: Goal set today Additional Arm Goal #2: Pt/husb will be independent in sling use. Arm Goal: Additional Goal #2 - Progress: Goal set  today  OT Evaluation Precautions/Restrictions  Precautions Precautions: Shoulder;Fall Precaution Booklet Issued: Yes (comment) Prior Functioning Home Living Lives With: Spouse Type of Home: House Home Layout: Two level;Able to live on main level with bedroom/bathroom;Full bath on main level Home Access: Stairs to enter Entrance Stairs-Rails: Right Entrance Stairs-Number of Steps: 2 Bathroom Shower/Tub: Engineer, manufacturing systems: Standard Home Adaptive Equipment: Paediatric nurse with back;Straight cane;Walker - rolling Prior Function Level of Independence: Independent with basic ADLs;Independent with homemaking with ambulation;Independent with gait;Requires assistive device for independence;Other (comment) (has housekeeper to do heavy work) Able to [employer]?: Yes Driving: Yes    Evern Bio 09/14/2011, 9:24 AM  [phone]

## 2011-09-14 NOTE — Progress Notes (Signed)
Subjective: 1 Day Post-Op Procedure(s) (LRB): TOTAL SHOULDER ARTHROPLASTY (Left) Patient reports pain as moderate.   Denies CP or SOB.  Voiding without difficulty. Positive flatus. Objective: Vital signs in last 24 hours: Temp:  [97.5 F (36.4 C)-99 F (37.2 C)] 99 F (37.2 C) (03/23 0451) Pulse Rate:  [61-83] 81  (03/23 0451) Resp:  [14-22] 20  (03/23 0451) BP: (114-166)/(50-98) 123/50 mmHg (03/23 0451) SpO2:  [95 %-99 %] 96 % (03/23 0451) Weight:  [84 kg (185 lb 3 oz)] 84 kg (185 lb 3 oz) (03/23 0000)  Intake/Output from previous day: 03/22 0701 - 03/23 0700 In: 2370 [P.O.:240; I.V.:2130] Out: 150 [Blood:150] Intake/Output this shift:     Basename 09/14/11 0500  HGB 9.2*    Basename 09/14/11 0500  WBC --  RBC --  HCT 29.3*  PLT --    Basename 09/14/11 0500  NA 135  K 3.8  CL 102  CO2 27  BUN 17  CREATININE 0.95  GLUCOSE 91  CALCIUM 8.2*   No results found for this basename: LABPT:2,INR:2 in the last 72 hours  Neurologically intact Neurovascular intact Sensation intact distally Incision: dressing C/D/I Compartment soft  Assessment/Plan: 1 Day Post-Op Procedure(s) (LRB): TOTAL SHOULDER ARTHROPLASTY (Left) Advance diet Up with therapy Plan for discharge tomorrow  Blakelynn Scheeler R. 09/14/2011, 8:25 AM

## 2011-09-14 NOTE — Progress Notes (Signed)
PT EVALUATION  09/14/11 1407  PT Visit Information  Last PT Received On 09/14/11  Patient Stated Goals  Goal #1 use both hand to be able to do husband's dressings  Precautions  Precautions Shoulder;Fall  Required Braces or Orthoses Yes  Other Brace/Splint left UE sling in place  Home Living  Lives With Spouse  Receives Help From Family  Type of Home House  Home Access Stairs to enter  Entrance Stairs-Rails Right  Entrance Stairs-Number of Steps 2  Bathroom Shower/Tub Tub/shower unit  [employer] fast streamer chair with back;Straight cane;Walker - rolling  Prior Function  Level of Independence Independent with basic ADLs;Independent with homemaking with ambulation;Independent with gait;Independent with transfers;Requires assistive device for independence;Other (comment) (uses straight cane)  Able to Take Stairs? Yes  Driving Yes  Vocation Retired  Public house manager  Overall Cognitive Status Appears within functional limits for tasks assessed  Orientation Level Oriented X4  Sensation  Light Touch Impaired by gross assessment (both lower legs; reports neuropathy)  Bed Mobility  Bed Mobility Yes  Supine to Sit 5: Supervision;HOB elevated (Comment degrees)  Supine to Sit Details (indicate cue type and reason) pt. used safe technique up to EOB  Transfers  Transfers Yes  Sit to Stand 5: Supervision;From bed;With upper extremity assist;Other (comment) (used right arm to assist self)  Sit to Stand Details (indicate cue type and reason) vc's for safety and stability  Stand to Sit 4: Min assist;With armrests;To chair/3-in-1;Other (comment) (use of right UE)  Stand to Sit Details min assist to control descent  Ambulation/Gait  Ambulation/Gait Yes  Ambulation/Gait Assistance 4: Min assist  Ambulation/Gait Assistance Details (indicate cue type and reason) min assist for balance and stability  Ambulation Distance (Feet) 120 Feet   Assistive device None  Gait Pattern Step-through pattern;Trunk flexed  Stairs Yes  Stairs Assistance 4: Min assist  Stair Management Technique Forwards;One rail Left;Other (comment) (right hand hold assist going up;use of right rail going down)  Number of Stairs 2   Balance  Balance Assessed (slight decreased balance with ambulation)  RUE Assessment  RUE Assessment Not tested  LUE Assessment  LUE Assessment Not tested  RLE Assessment  RLE Assessment WFL  LLE Assessment  LLE Assessment WFL  General  Behavior During Session [facility] for tasks performed  Cognition [facility] for tasks performed  PT Assessment  Clinical Impression Statement Pt. is s/p left reverse total shoulder and history of 2 "bad falls" per. pt.  She attributes falls to her neuropathy in lfeet and lower legs.  Pt. is eduacted on need to use extreme caution now that left UE is in sling.  She needs PT to address mobility issues before DC home.    PT Recommendation/Assessment Patient will need skilled PT in the acute care venue  PT Problem List Decreased activity tolerance;Decreased balance;Decreased mobility;Decreased knowledge of precautions;Pain  Barriers to Discharge None  PT Therapy Diagnosis  Difficulty walking;Acute pain  PT Plan  PT Frequency 7X/week  PT Treatment/Interventions DME instruction;Gait training;Stair training;Functional mobility training;Therapeutic activities;Balance training;Patient/family education  PT Recommendation  Follow Up Recommendations Home health PT;Supervision for mobility/OOB  Equipment Recommended None recommended by PT;Other (comment) (pt. has own straight cane)  Individuals Consulted  Consulted and Agree with Results and Recommendations Patient  Acute Rehab PT Goals  PT Goal Formulation With patient  Time For Goal Achievement 2 days  Pt will go Supine/Side to Sit with modified independence  PT Goal: Supine/Side to Sit - Progress Goal  set today  Pt will go Sit to Supine/Side with  modified independence  PT Goal: Sit to Supine/Side - Progress Goal set today  Pt will go Sit to Stand with modified independence  PT Goal: Sit to Stand - Progress Goal set today  Pt will go Stand to Sit with modified independence  PT Goal: Stand to Sit - Progress Goal set today  Pt will Ambulate 51 - 150 feet;with modified independence;with least restrictive assistive device;with cane  PT Goal: Ambulate - Progress Goal set today  Pt will Go Up / Down Stairs 1-2 stairs;with min assist;with rail(s);Other (comment) (with hand hold assist)  PT Goal: Up/Down Stairs - Progress Goal set today   Weldon Picking PT Acute Rehab Services [phone] Beeper [phone]

## 2011-09-15 LAB — GLUCOSE, CAPILLARY: Glucose-Capillary: 94 mg/dL (ref 70–99)

## 2011-09-15 MED ORDER — DIPHENHYDRAMINE HCL 25 MG PO CAPS: 25.0000 mg | ORAL_CAPSULE | Freq: Four times a day (QID) | ORAL | Status: DC | PRN

## 2011-09-15 NOTE — Progress Notes (Signed)
Orthopedics Progress Note  Subjective: Pt doing well s/p left shoulder surgery. Minimal pain today  Objective:  Filed Vitals:   09/15/11 0552  BP: 115/52  Pulse: 84  Temp: 99.6 F (37.6 C)  Resp: 18    General: Awake and alert  Musculoskeletal: left shoulder incision healing well, nv intact distally Neurovascularly intact  Lab Results  Component Value Date   WBC 7.9 09/06/2011   HGB 9.2* 09/14/2011   HCT 29.3* 09/14/2011   MCV 84.7 09/06/2011   PLT 230 09/06/2011       Component Value Date/Time   NA 135 09/14/2011 0500   K 3.8 09/14/2011 0500   CL 102 09/14/2011 0500   CO2 27 09/14/2011 0500   GLUCOSE 91 09/14/2011 0500   BUN 17 09/14/2011 0500   CREATININE 0.95 09/14/2011 0500   CALCIUM 8.2* 09/14/2011 0500   GFRNONAA 58* 09/14/2011 0500   GFRAA 67* 09/14/2011 0500    No results found for this basename: INR, PROTIME    Assessment/Plan: POD #2 s/p Procedure(s):left TOTAL SHOULDER ARTHROPLASTY  Discharge home today F.u in 2 weeks  Almedia Balls. Ranell Patrick, MD 09/15/2011 8:46 AM

## 2011-09-15 NOTE — Progress Notes (Signed)
Occupational Therapy Treatment Patient Details Name: [name] MRN: [id number] DOB: 03-27-1936 Today's Date: 09/15/2011  OT Assessment/Plan OT Assessment/Plan Comments on Treatment Session: Pt with decreased pain today.  Pt able to tolerate pendulum exercises today.   OT Plan: Discharge plan remains appropriate OT Frequency: Min 2X/week Follow Up Recommendations: Home health OT;Supervision - Intermittent Equipment Recommended: None recommended by OT OT Goals Acute Rehab OT Goals OT Goal Formulation: With patient Time For Goal Achievement: 7 days ADL Goals Pt Will Transfer to Toilet: with modified independence;Ambulation;Regular height toilet ADL Goal: Toilet Transfer - Progress: Progressing toward goals Pt Will Perform Toileting - Clothing Manipulation: with modified independence;Standing ADL Goal: Toileting - Clothing Manipulation - Progress: Progressing toward goals Arm Goals Pt Will Perform AROM: 10 reps;to maintain range of motion;Independently Arm Goal: AROM - Progress: Progressing toward goal Additional Arm Goal #1: Pt/Husb will perform AAROM L shoulder within tolerance. Arm Goal: Additional Goal #1 - Progress: Progressing toward goals Additional Arm Goal #2: Pt/husb will be independent in sling use. Arm Goal: Additional Goal #2 - Progress: Progressing toward goals  OT Treatment Precautions/Restrictions  Precautions Precautions: Shoulder;Fall Precaution Booklet Issued: Yes (comment) Precaution Comments: Reviewed handout information with pt. Required Braces or Orthoses: Yes Other Brace/Splint: left UE sling in place   ADL ADL Toilet Transfer: Performed;Minimal assistance Toilet Transfer Details (indicate cue type and reason): Min assist for balance. Toilet Transfer Method: Proofreader: Raised toilet seat with arms (or 3-in-1 over toilet) Toileting - Clothing Manipulation: Performed;Minimal assistance Toileting - Clothing Manipulation  Details (indicate cue type and reason): Min assist to pull undergarment up over Lt. hip Where Assessed - Toileting Clothing Manipulation: Standing Toileting - Hygiene: Performed;Modified independent Where Assessed - Toileting Hygiene: Sit on 3-in-1 or toilet Equipment Used: Cane Ambulation Related to ADLs: Pt demonstrates balance deficits during functional mobility with trunk flexed and slow gait.   ADL Comments: Pt verbalizing understanding of dressing/bathing techniques. Husband demonstrates independence in assisting with donning sling. Mobility  Bed Mobility Bed Mobility: Yes Supine to Sit: 5: Supervision;HOB elevated (Comment degrees) Sit to Supine: 5: Supervision Transfers Transfers: Yes Sit to Stand: 4: Min assist;From bed;With upper extremity assist;From chair/3-in-1 Sit to Stand Details (indicate cue type and reason): vc's to push through RUE Stand to Sit: 5: Supervision;To bed;To chair/3-in-1;With armrests;With upper extremity assist Exercises Shoulder Exercises Pendulum Exercise: AAROM;PROM;Left;5 reps;Standing Shoulder Flexion: AAROM;Left;10 reps Elbow Flexion: AROM;Left;10 reps Elbow Extension: AROM;Left;10 reps Wrist Flexion: AAROM;Left;10 reps Wrist Extension: AROM;Left;10 reps Digit Composite Flexion: AROM;Left;10 reps Composite Extension: AROM;Left;10 reps  End of Session OT - End of Session Equipment Utilized During Treatment: Gait belt;Other (comment) (LUE sling) Activity Tolerance: Patient limited by fatigue Patient left: in bed;with call bell in reach;with family/visitor present General Behavior During Session: [facility] for tasks performed Cognition: [facility] for tasks performed   1:43 PM 09/15/2011 Cipriano Mile OTR/L Pager [phone] Office [phone]

## 2011-09-15 NOTE — Progress Notes (Signed)
   CARE MANAGEMENT NOTE 09/15/2011  Patient:  [name], [name]   Account Number:  [id number]  Date Initiated:  09/15/2011  Documentation initiated by:  [facility]  Subjective/Objective Assessment:     Action/Plan:   Anticipated DC Date:  09/15/2011   Anticipated DC Plan:  HOME W HOME HEALTH SERVICES      DC Planning Services  CM consult      [facility] Choice  HOME HEALTH   Choice offered to / List presented to:  C-1 Patient   DME arranged  3-N-1      DME agency  [facility]     HH arranged  HH-2 PT  HH-3 OT      [facility] agency  [facility]   Status of service:  Completed, signed off Medicare Important Message given?   (If response is "NO", the following Medicare IM given date fields will be blank) Date Medicare IM given:   Date Additional Medicare IM given:    Discharge Disposition:  HOME W HOME HEALTH SERVICES  Per UR Regulation:    If discussed at Long Length of Stay Meetings, dates discussed:    Comments:  09/15/2011 1420 Contacted [facility] for [facility] and DME for scheduled d/c today. Isidoro Donning RN CCM Case Mgmt phone [phone]

## 2011-09-15 NOTE — Progress Notes (Signed)
Physical Therapy Treatment Patient Details Name: [name] MRN: [id number] DOB: [birth year] Today's Date: 09/15/2011  PT Assessment/Plan  PT - Assessment/Plan Comments on Treatment Session: Increased gait distance with less assistance needed for mobility. Still requires cues for increased safety with mobility due to fall risk. PT Plan: Discharge plan remains appropriate;Frequency remains appropriate PT Frequency: 7X/week Follow Up Recommendations: Home health PT;Supervision for mobility/OOB Equipment Recommended: None recommended by PT;None recommended by OT PT Goals  Acute Rehab PT Goals PT Goal: Supine/Side to Sit - Progress: Progressing toward goal PT Goal: Sit to Supine/Side - Progress: Progressing toward goal PT Goal: Sit to Stand - Progress: Progressing toward goal PT Goal: Stand to Sit - Progress: Progressing toward goal PT Goal: Ambulate - Progress: Progressing toward goal  PT Treatment Precautions/Restrictions  Precautions Precautions: Shoulder Precaution Booklet Issued: Yes (comment) Precaution Comments: Reviewed handout information with pt. Required Braces or Orthoses: Yes Other Brace/Splint: left UE sling at all times Mobility (including Balance) Bed Mobility Bed Mobility: Yes Supine to Sit: 5: Supervision;HOB elevated (Comment degrees) (HOB approx 30 degrees) Supine to Sit Details (indicate cue type and reason): pt with safe technique to edge of bed. Sit to Supine: 5: Supervision;HOB flat Sit to Supine - Details (indicate cue type and reason): no assist or cues needed with lying down in bed Transfers Sit to Stand: 5: Supervision;From bed;With upper extremity assist (with right arm assist only) Sit to Stand Details (indicate cue type and reason): vc's to push through RUE Stand to Sit: 5: Supervision;To bed;Without upper extremity assist Ambulation/Gait Ambulation/Gait Assistance: 4: Min assist (min guard assist) Ambulation/Gait Assistance Details (indicate  cue type and reason): verbal/tactile cues for upright posture, sequency with SPC and to slow down gait for safety. pt with reports of bil feet neuropathy. cues to slow down and ensure stance stability before advancing opposing foot fwd for increased gait safety. Educated pt to use [facility]  with all gait for safety (pt reported using no AD inside house and multiple falls).              Ambulation Distance (Feet): 150 Feet Assistive device: Straight cane Gait Pattern: Step-through pattern;Decreased stride length;Trunk flexed;Right flexed knee in stance;Left flexed knee in stance  Posture/Postural Control Posture/Postural Control: No significant limitations Exercise  Shoulder Exercises Pendulum Exercise: AAROM;PROM;Left;5 reps;Standing Shoulder Flexion: AAROM;Left;10 reps Elbow Flexion: AROM;Left;10 reps Elbow Extension: AROM;Left;10 reps Wrist Flexion: AAROM;Left;10 reps Wrist Extension: AROM;Left;10 reps Digit Composite Flexion: AROM;Left;10 reps Composite Extension: AROM;Left;10 reps End of Session PT - End of Session Equipment Utilized During Treatment: Gait belt;Other (comment) (sling to left arm) Activity Tolerance: Patient tolerated treatment well Patient left: in bed;with call bell in reach;with family/visitor present Nurse Communication: Mobility status for transfers;Mobility status for ambulation General Behavior During Session: [facility] for tasks performed Cognition: [facility] for tasks performed  Sallyanne Kuster 09/15/2011, 1:54 PM  Sallyanne Kuster, PTA Office- [phone] Pager- [phone]

## 2011-09-15 NOTE — Progress Notes (Signed)
D/C instructions reviewed with patient and husband. RX given to pt by MD. BSC delivered to room. All questions answered. Pt d/c'ed via wheelchair in stable condition

## 2011-09-16 ENCOUNTER — Encounter (HOSPITAL_COMMUNITY): Payer: Self-pay | Admitting: Orthopedic Surgery

## 2012-04-14 ENCOUNTER — Encounter (HOSPITAL_COMMUNITY): Payer: Self-pay | Admitting: Pharmacy Technician

## 2012-04-21 ENCOUNTER — Encounter (HOSPITAL_COMMUNITY)
Admission: RE | Admit: 2012-04-21 | Discharge: 2012-04-21 | Disposition: A | Discharge status: Home / Self Care | Payer: Medicare Other | Source: Ambulatory Visit | Attending: Orthopedic Surgery | Admitting: Orthopedic Surgery

## 2012-04-21 ENCOUNTER — Encounter (HOSPITAL_COMMUNITY): Payer: Self-pay

## 2012-04-21 LAB — BASIC METABOLIC PANEL
BUN: 13 mg/dL (ref 6–23)
Chloride: 100 mEq/L (ref 96–112)
Creatinine, Ser: 0.93 mg/dL (ref 0.50–1.10)
GFR calc Af Amer: 68 mL/min — ABNORMAL LOW (ref >90)
GFR calc non Af Amer: 59 mL/min — ABNORMAL LOW (ref >90)
Potassium: 3.8 mEq/L (ref 3.5–5.1)

## 2012-04-21 LAB — CBC
Hemoglobin: 11.3 g/dL — ABNORMAL LOW (ref 12.0–15.0)
MCHC: 32.1 g/dL (ref 30.0–36.0)
RDW: 17 % — ABNORMAL HIGH (ref 11.5–15.5)
WBC: 7.9 10*3/uL (ref 4.0–10.5)

## 2012-04-21 LAB — URINALYSIS, ROUTINE W REFLEX MICROSCOPIC
Bilirubin Urine: NEGATIVE
Glucose, UA: NEGATIVE mg/dL
Ketones, ur: NEGATIVE mg/dL
Protein, ur: NEGATIVE mg/dL
pH: 7 (ref 5.0–8.0)

## 2012-04-21 LAB — PROTIME-INR
INR: 1.13 (ref 0.00–1.49)
Prothrombin Time: 14.3 seconds (ref 11.6–15.2)

## 2012-04-21 LAB — SURGICAL PCR SCREEN
MRSA, PCR: NEGATIVE
Staphylococcus aureus: NEGATIVE

## 2012-04-21 LAB — APTT: aPTT: 34 seconds (ref 24–37)

## 2012-04-21 NOTE — Patient Instructions (Addendum)
YOUR SURGERY IS SCHEDULED AT [facility]  ON:  Monday  11/4  AT 12:25 PM  REPORT TO [place] [facility] AT:  10:00 AM      PHONE # FOR SHORT STAY IS [phone]  DO NOT EAT OR DRINK ANYTHING AFTER MIDNIGHT THE NIGHT BEFORE YOUR SURGERY.  YOU MAY BRUSH YOUR TEETH, RINSE OUT YOUR MOUTH--BUT NO WATER, NO FOOD, NO CHEWING GUM, NO MINTS, NO CANDIES, NO CHEWING TOBACCO.  PLEASE TAKE THE FOLLOWING MEDICATIONS THE AM OF YOUR SURGERY WITH A FEW SIPS OF WATER:   PROTONIX AND VENLAFAXINE   IF YOU USE INHALERS--USE YOUR INHALERS THE AM OF YOUR SURGERY AND BRING INHALERS TO THE HOSPITAL -TAKE TO SURGERY.    IF YOU ARE DIABETIC:  DO NOT TAKE ANY DIABETIC MEDICATIONS THE AM OF YOUR SURGERY.  IF YOU TAKE INSULIN IN THE EVENINGS--PLEASE ONLY TAKE 1/2 NORMAL EVENING DOSE THE NIGHT BEFORE YOUR SURGERY.  NO INSULIN THE AM OF YOUR SURGERY.  IF YOU HAVE SLEEP APNEA AND USE CPAP OR BIPAP--PLEASE BRING THE MASK AND THE TUBING.  DO NOT BRING YOUR MACHINE.  DO NOT BRING VALUABLES, MONEY, CREDIT CARDS.  DO NOT WEAR JEWELRY, MAKE-UP, NAIL POLISH AND NO METAL PINS OR CLIPS IN YOUR HAIR. CONTACT LENS, DENTURES / PARTIALS, GLASSES SHOULD NOT BE WORN TO SURGERY AND IN MOST CASES-HEARING AIDS WILL NEED TO BE REMOVED.  BRING YOUR GLASSES CASE, ANY EQUIPMENT NEEDED FOR YOUR CONTACT LENS. FOR PATIENTS ADMITTED TO THE HOSPITAL--CHECK OUT TIME THE DAY OF DISCHARGE IS 11:00 AM.  ALL INPATIENT ROOMS ARE PRIVATE - WITH BATHROOM, TELEPHONE, TELEVISION AND WIFI INTERNET.  IF YOU ARE BEING DISCHARGED THE SAME DAY OF YOUR SURGERY--YOU CAN NOT DRIVE YOURSELF HOME--AND SHOULD NOT GO HOME ALONE BY TAXI OR BUS.  NO DRIVING OR OPERATING MACHINERY FOR 24 HOURS FOLLOWING ANESTHESIA / PAIN MEDICATIONS.  PLEASE MAKE ARRANGEMENTS FOR SOMEONE TO BE WITH YOU AT HOME THE FIRST 24 HOURS AFTER SURGERY. RESPONSIBLE DRIVER'S NAME___________________________                                               PHONE #   _______________________                                   PLEASE READ OVER ANY  FACT SHEETS THAT YOU WERE GIVEN: MRSA INFORMATION, BLOOD TRANSFUSION INFORMATION, INCENTIVE SPIROMETER INFORMATION.

## 2012-04-21 NOTE — Pre-Procedure Instructions (Signed)
EKG AND CXR REPORTS ARE IN EPIC FROM 09/06/11.  CBC, BMET, PT, PTT, UA, T/S WERE DONE TODAY-PREOP - AT [facility]

## 2012-04-22 NOTE — H&P (Signed)
TOTAL KNEE ADMISSION H&P  Patient is being admitted for right total knee arthroplasty.  Subjective:  Chief Complaint:   Right knee OA / pain.  HPI: [name], 76 y.o. female, has a history of pain and functional disability in the right knee due to arthritis and has failed non-surgical conservative treatments for greater than 12 weeks to includeNSAID's and/or analgesics, corticosteriod injections, use of assistive devices and activity modification.  Onset of symptoms was gradual, starting 10 years ago with gradually worsening course since that time. The patient noted no past surgery on the right knee(s).  Patient currently rates pain in the right knee(s) at 8 out of 10 with activity. Patient has worsening of pain with activity and weight bearing, pain that interferes with activities of daily living, crepitus and joint swelling.  Patient has evidence of periarticular osteophytes and joint space narrowing by imaging studies. Risks, benefits and expectations were discussed with the patient. Patient understand the risks, benefits and expectations and wishes to proceed with surgery.   D/C Plans:  Home with HHPT  Post-op Meds:   Rx given for ASA, Robaxin, Celebrex, Iron, Colace and MiraLax  Tranexamic Acid:  To be given  Decadron:   To be given   Patient Active Problem List   Diagnosis Date Noted  . Rotator cuff arthropathy 09/13/2011   Past Medical History  Diagnosis Date  . GERD (gastroesophageal reflux disease)   . Depression   . Hypertension     Past Surgical History  Procedure Date  . Bone spur      r thigh  . Total shoulder arthroplasty 09/13/2011    Procedure: TOTAL SHOULDER ARTHROPLASTY;  Surgeon: Verlee Rossetti, MD;  Location: [facility] OR;  Service: Orthopedics;  Laterality: Left;  LEFT SHOULDER REVERSED TOTAL SHOULDER ARTHROPLASTY    No prescriptions prior to admission   Allergies  Allergen Reactions  . Codeine Itching and Other (See Comments)    Makes feel crazy  PT  STATES SHE ALSO CAN NOT TAKE THE SYNTHETIC CODEINES  . Hydrocodone     ITCHING  . Oxycodone     ITCHING  . Tetanus Toxoids Swelling    And rash    History  Substance Use Topics  . Smoking status: Never Smoker   . Smokeless tobacco: Not on file  . Alcohol Use: No      Review of Systems  Constitutional: Negative.   HENT: Negative.   Eyes: Negative.   Respiratory: Negative.   Cardiovascular: Negative.   Gastrointestinal: Negative.   Genitourinary: Negative.   Musculoskeletal: Positive for myalgias and joint pain.  Skin: Negative.   Neurological: Negative.   Endo/Heme/Allergies: Negative.   Psychiatric/Behavioral: Negative.     Objective:  Physical Exam  Constitutional: She is oriented to person, place, and time. She appears well-developed and well-nourished.  HENT:  Head: Normocephalic and atraumatic.  Nose: Nose normal.  Mouth/Throat: Oropharynx is clear and moist.  Neck: Neck supple. No JVD present. No tracheal deviation present. No thyromegaly present.  Cardiovascular: Normal rate, regular rhythm, normal heart sounds and intact distal pulses.   Respiratory: Effort normal and breath sounds normal. No stridor. No respiratory distress. She has no wheezes.  GI: Soft. There is no tenderness. There is no guarding.  Musculoskeletal:       Right knee: She exhibits decreased range of motion, swelling and bony tenderness. She exhibits no effusion and no laceration. tenderness found.  Lymphadenopathy:    She has no cervical adenopathy.  Neurological: She is alert  and oriented to person, place, and time.  Skin: Skin is warm and dry.  Psychiatric: She has a normal mood and affect.    Vital signs in last 24 hours: BP : 142/92 ; HR : 80 ; Resp : 16  Labs:  Estimated Body mass index is 27.08 kg/(m^2) as calculated from the following:   Height as of 09/06/11: 5\' 9" (1.753 m).   Weight as of 09/06/11: 183 lb 6.4 oz(83.19 kg).   Imaging Review Plain radiographs demonstrate  severe degenerative joint disease of the right knee(s). The overall alignment isneutral. The bone quality appears to be good for age and reported activity level.  Assessment/Plan:  End stage arthritis, right knee   The patient history, physical examination, clinical judgment of the provider and imaging studies are consistent with end stage degenerative joint disease of the right knee(s) and total knee arthroplasty is deemed medically necessary. The treatment options including medical management, injection therapy arthroscopy and arthroplasty were discussed at length. The risks and benefits of total knee arthroplasty were presented and reviewed. The risks due to aseptic loosening, infection, stiffness, patella tracking problems, thromboembolic complications and other imponderables were discussed. The patient acknowledged the explanation, agreed to proceed with the plan and consent was signed. Patient is being admitted for inpatient treatment for surgery, pain control, PT, OT, prophylactic antibiotics, VTE prophylaxis, progressive ambulation and ADL's and discharge planning. The patient is planning to be discharged home with home health services.     Anastasio Auerbach Cabella Kimm   PAC  04/26/2012, 2:58 PM

## 2012-04-27 ENCOUNTER — Inpatient Hospital Stay (HOSPITAL_COMMUNITY)
Admission: RE | Admit: 2012-04-27 | Discharge: 2012-04-30 | DRG: 470 | Disposition: A | Discharge status: Home Health | Payer: Medicare Other | Source: Ambulatory Visit | Attending: Orthopedic Surgery | Admitting: Orthopedic Surgery

## 2012-04-27 ENCOUNTER — Encounter (HOSPITAL_COMMUNITY): Payer: Self-pay | Admitting: Anesthesiology

## 2012-04-27 ENCOUNTER — Encounter (HOSPITAL_COMMUNITY): Payer: Self-pay | Admitting: *Deleted

## 2012-04-27 ENCOUNTER — Encounter (HOSPITAL_COMMUNITY)
Admission: RE | Disposition: A | Discharge status: Home Health | Payer: Self-pay | Source: Ambulatory Visit | Attending: Orthopedic Surgery

## 2012-04-27 ENCOUNTER — Inpatient Hospital Stay (HOSPITAL_COMMUNITY): Payer: Medicare Other | Admitting: Anesthesiology

## 2012-04-27 DIAGNOSIS — E663 Overweight: Secondary | ICD-10-CM

## 2012-04-27 DIAGNOSIS — F3289 Other specified depressive episodes: Secondary | ICD-10-CM | POA: Diagnosis present

## 2012-04-27 DIAGNOSIS — Z885 Allergy status to narcotic agent status: Secondary | ICD-10-CM

## 2012-04-27 DIAGNOSIS — Z887 Allergy status to serum and vaccine status: Secondary | ICD-10-CM

## 2012-04-27 DIAGNOSIS — I10 Essential (primary) hypertension: Secondary | ICD-10-CM | POA: Diagnosis present

## 2012-04-27 DIAGNOSIS — Z6826 Body mass index (BMI) 26.0-26.9, adult: Secondary | ICD-10-CM

## 2012-04-27 DIAGNOSIS — M1710 Unilateral primary osteoarthritis, unspecified knee: Principal | ICD-10-CM | POA: Diagnosis present

## 2012-04-27 DIAGNOSIS — K219 Gastro-esophageal reflux disease without esophagitis: Secondary | ICD-10-CM | POA: Diagnosis present

## 2012-04-27 DIAGNOSIS — D62 Acute posthemorrhagic anemia: Secondary | ICD-10-CM | POA: Diagnosis not present

## 2012-04-27 DIAGNOSIS — F329 Major depressive disorder, single episode, unspecified: Secondary | ICD-10-CM | POA: Diagnosis present

## 2012-04-27 DIAGNOSIS — D500 Iron deficiency anemia secondary to blood loss (chronic): Secondary | ICD-10-CM

## 2012-04-27 DIAGNOSIS — Z96659 Presence of unspecified artificial knee joint: Secondary | ICD-10-CM

## 2012-04-27 DIAGNOSIS — E871 Hypo-osmolality and hyponatremia: Secondary | ICD-10-CM

## 2012-04-27 DIAGNOSIS — Z96619 Presence of unspecified artificial shoulder joint: Secondary | ICD-10-CM

## 2012-04-27 HISTORY — PX: TOTAL KNEE ARTHROPLASTY: SHX125

## 2012-04-27 LAB — TYPE AND SCREEN: ABO/RH(D): O POS

## 2012-04-27 SURGERY — ARTHROPLASTY, KNEE, TOTAL
Anesthesia: Spinal | Site: Knee | Laterality: Right | Wound class: Clean

## 2012-04-27 MED ORDER — DEXAMETHASONE SODIUM PHOSPHATE 10 MG/ML IJ SOLN
10.0000 mg | Freq: Once | INTRAMUSCULAR | Status: DC
  Filled 2012-04-27: qty 1

## 2012-04-27 MED ORDER — BUPIVACAINE-EPINEPHRINE (PF) 0.5% -1:200000 IJ SOLN
INTRAMUSCULAR | Status: AC
  Filled 2012-04-27: qty 10

## 2012-04-27 MED ORDER — HYDROCODONE-ACETAMINOPHEN 7.5-325 MG PO TABS
1.0000 | ORAL_TABLET | ORAL | Status: DC
  Administered 2012-04-27 – 2012-04-30 (×11): 1 via ORAL
  Filled 2012-04-27 (×11): qty 1

## 2012-04-27 MED ORDER — FLEET ENEMA 7-19 GM/118ML RE ENEM: 1.0000 | ENEMA | Freq: Once | RECTAL | Status: AC | PRN

## 2012-04-27 MED ORDER — FERROUS SULFATE 325 (65 FE) MG PO TABS
325.0000 mg | ORAL_TABLET | Freq: Three times a day (TID) | ORAL | Status: DC
  Administered 2012-04-27 – 2012-04-30 (×7): 325 mg via ORAL
  Filled 2012-04-27 (×11): qty 1

## 2012-04-27 MED ORDER — PROPOFOL 10 MG/ML IV EMUL
INTRAVENOUS | Status: DC | PRN
  Administered 2012-04-27: 25 ug/kg/min via INTRAVENOUS

## 2012-04-27 MED ORDER — BUPIVACAINE-EPINEPHRINE 0.25% -1:200000 IJ SOLN
INTRAMUSCULAR | Status: AC
  Filled 2012-04-27: qty 1

## 2012-04-27 MED ORDER — VENLAFAXINE HCL ER 75 MG PO CP24
225.0000 mg | ORAL_CAPSULE | Freq: Every day | ORAL | Status: DC
  Administered 2012-04-28 – 2012-04-30 (×3): 225 mg via ORAL
  Filled 2012-04-27 (×4): qty 3

## 2012-04-27 MED ORDER — CEFAZOLIN SODIUM-DEXTROSE 2-3 GM-% IV SOLR
2.0000 g | Freq: Four times a day (QID) | INTRAVENOUS | Status: AC
  Administered 2012-04-27 (×2): 2 g via INTRAVENOUS
  Filled 2012-04-27 (×2): qty 50

## 2012-04-27 MED ORDER — SODIUM CHLORIDE 0.9 % IV SOLN
INTRAVENOUS | Status: AC
  Administered 2012-04-27: 18:00:00 via INTRAVENOUS
  Filled 2012-04-27 (×4): qty 1000

## 2012-04-27 MED ORDER — CELECOXIB 200 MG PO CAPS
200.0000 mg | ORAL_CAPSULE | Freq: Two times a day (BID) | ORAL | Status: DC
  Filled 2012-04-27 (×3): qty 1

## 2012-04-27 MED ORDER — HYDROMORPHONE HCL PF 1 MG/ML IJ SOLN: 0.2500 mg | INTRAMUSCULAR | Status: DC | PRN

## 2012-04-27 MED ORDER — 0.9 % SODIUM CHLORIDE (POUR BTL) OPTIME
TOPICAL | Status: DC | PRN
  Administered 2012-04-27: 1000 mL

## 2012-04-27 MED ORDER — ONDANSETRON HCL 4 MG/2ML IJ SOLN
INTRAMUSCULAR | Status: DC | PRN
  Administered 2012-04-27: 4 mg via INTRAVENOUS

## 2012-04-27 MED ORDER — KETOROLAC TROMETHAMINE 30 MG/ML IJ SOLN
INTRAMUSCULAR | Status: DC | PRN
  Administered 2012-04-27: 30 mg

## 2012-04-27 MED ORDER — HYDROCHLOROTHIAZIDE 25 MG PO TABS
25.0000 mg | ORAL_TABLET | Freq: Every day | ORAL | Status: DC
Start: 2012-04-28 — End: 2012-04-30
  Administered 2012-04-28 – 2012-04-30 (×3): 25 mg via ORAL
  Filled 2012-04-27 (×4): qty 1

## 2012-04-27 MED ORDER — CEFAZOLIN SODIUM-DEXTROSE 2-3 GM-% IV SOLR
2.0000 g | INTRAVENOUS | Status: AC
  Administered 2012-04-27: 2 g via INTRAVENOUS

## 2012-04-27 MED ORDER — MIDAZOLAM HCL 5 MG/5ML IJ SOLN
INTRAMUSCULAR | Status: DC | PRN
  Administered 2012-04-27 (×2): 1 mg via INTRAVENOUS

## 2012-04-27 MED ORDER — BISACODYL 10 MG RE SUPP: 10.0000 mg | Freq: Every day | RECTAL | Status: DC | PRN

## 2012-04-27 MED ORDER — METHOCARBAMOL 100 MG/ML IJ SOLN
500.0000 mg | Freq: Four times a day (QID) | INTRAVENOUS | Status: DC | PRN
  Filled 2012-04-27: qty 5

## 2012-04-27 MED ORDER — LIDOCAINE HCL (CARDIAC) 20 MG/ML IV SOLN
INTRAVENOUS | Status: DC | PRN
  Administered 2012-04-27: 50 mg via INTRAVENOUS

## 2012-04-27 MED ORDER — FENTANYL CITRATE 0.05 MG/ML IJ SOLN
50.0000 ug | Freq: Once | INTRAMUSCULAR | Status: AC
  Administered 2012-04-27: 50 ug via INTRAVENOUS

## 2012-04-27 MED ORDER — LACTATED RINGERS IV SOLN: INTRAVENOUS | Status: DC

## 2012-04-27 MED ORDER — LACTATED RINGERS IV SOLN
INTRAVENOUS | Status: DC
  Administered 2012-04-27: 14:00:00 via INTRAVENOUS
  Administered 2012-04-27: 1000 mL via INTRAVENOUS

## 2012-04-27 MED ORDER — HYDROMORPHONE HCL PF 1 MG/ML IJ SOLN: 0.5000 mg | INTRAMUSCULAR | Status: DC | PRN

## 2012-04-27 MED ORDER — FENTANYL CITRATE 0.05 MG/ML IJ SOLN
INTRAMUSCULAR | Status: AC
  Filled 2012-04-27: qty 2

## 2012-04-27 MED ORDER — BUPIVACAINE-EPINEPHRINE PF 0.25-1:200000 % IJ SOLN
INTRAMUSCULAR | Status: DC | PRN
  Administered 2012-04-27: 50 mL

## 2012-04-27 MED ORDER — ONDANSETRON HCL 4 MG/2ML IJ SOLN: 4.0000 mg | Freq: Four times a day (QID) | INTRAMUSCULAR | Status: DC | PRN

## 2012-04-27 MED ORDER — ALUM & MAG HYDROXIDE-SIMETH 200-200-20 MG/5ML PO SUSP: 30.0000 mL | ORAL | Status: DC | PRN

## 2012-04-27 MED ORDER — PHENOL 1.4 % MT LIQD: 1.0000 | OROMUCOSAL | Status: DC | PRN

## 2012-04-27 MED ORDER — TRANEXAMIC ACID 100 MG/ML IV SOLN
1200.0000 mg | Freq: Once | INTRAVENOUS | Status: AC
  Administered 2012-04-27: 1035 mg via INTRAVENOUS
  Filled 2012-04-27: qty 12

## 2012-04-27 MED ORDER — ZOLPIDEM TARTRATE 5 MG PO TABS: 5.0000 mg | ORAL_TABLET | Freq: Every evening | ORAL | Status: DC | PRN

## 2012-04-27 MED ORDER — MENTHOL 3 MG MT LOZG: 1.0000 | LOZENGE | OROMUCOSAL | Status: DC | PRN

## 2012-04-27 MED ORDER — RIVAROXABAN 10 MG PO TABS
10.0000 mg | ORAL_TABLET | ORAL | Status: DC
  Administered 2012-04-28 – 2012-04-30 (×3): 10 mg via ORAL
  Filled 2012-04-27 (×4): qty 1

## 2012-04-27 MED ORDER — POLYETHYLENE GLYCOL 3350 17 G PO PACK
17.0000 g | PACK | Freq: Two times a day (BID) | ORAL | Status: DC
  Administered 2012-04-27 – 2012-04-30 (×6): 17 g via ORAL

## 2012-04-27 MED ORDER — HYDRALAZINE HCL 20 MG/ML IJ SOLN
INTRAMUSCULAR | Status: DC | PRN
  Administered 2012-04-27: 5 mg via INTRAVENOUS

## 2012-04-27 MED ORDER — DIPHENHYDRAMINE HCL 25 MG PO CAPS
25.0000 mg | ORAL_CAPSULE | Freq: Four times a day (QID) | ORAL | Status: DC | PRN
  Administered 2012-04-27 – 2012-04-30 (×6): 25 mg via ORAL
  Filled 2012-04-27 (×6): qty 1

## 2012-04-27 MED ORDER — KETOROLAC TROMETHAMINE 30 MG/ML IJ SOLN
INTRAMUSCULAR | Status: AC
  Filled 2012-04-27: qty 1

## 2012-04-27 MED ORDER — CHLORHEXIDINE GLUCONATE 4 % EX LIQD
60.0000 mL | Freq: Once | CUTANEOUS | Status: DC
  Filled 2012-04-27: qty 60

## 2012-04-27 MED ORDER — ACETAMINOPHEN 10 MG/ML IV SOLN
INTRAVENOUS | Status: DC | PRN
  Administered 2012-04-27: 1000 mg via INTRAVENOUS

## 2012-04-27 MED ORDER — ONDANSETRON HCL 4 MG PO TABS: 4.0000 mg | ORAL_TABLET | Freq: Four times a day (QID) | ORAL | Status: DC | PRN

## 2012-04-27 MED ORDER — DEXAMETHASONE SODIUM PHOSPHATE 10 MG/ML IJ SOLN
10.0000 mg | Freq: Once | INTRAMUSCULAR | Status: AC
  Administered 2012-04-28: 10 mg via INTRAVENOUS
  Filled 2012-04-27: qty 1

## 2012-04-27 MED ORDER — SODIUM CHLORIDE 0.9 % IR SOLN
Status: DC | PRN
  Administered 2012-04-27: 1000 mL

## 2012-04-27 MED ORDER — DOCUSATE SODIUM 100 MG PO CAPS
100.0000 mg | ORAL_CAPSULE | Freq: Two times a day (BID) | ORAL | Status: DC
  Administered 2012-04-27 – 2012-04-30 (×6): 100 mg via ORAL

## 2012-04-27 MED ORDER — PANTOPRAZOLE SODIUM 40 MG PO TBEC
40.0000 mg | DELAYED_RELEASE_TABLET | Freq: Every day | ORAL | Status: DC
  Administered 2012-04-28 – 2012-04-30 (×3): 40 mg via ORAL
  Filled 2012-04-27 (×3): qty 1

## 2012-04-27 MED ORDER — BUPIVACAINE-EPINEPHRINE 0.5% -1:200000 IJ SOLN
INTRAMUSCULAR | Status: AC
  Filled 2012-04-27: qty 1

## 2012-04-27 MED ORDER — METHOCARBAMOL 500 MG PO TABS
500.0000 mg | ORAL_TABLET | Freq: Four times a day (QID) | ORAL | Status: DC | PRN
  Administered 2012-04-27 – 2012-04-28 (×2): 500 mg via ORAL
  Filled 2012-04-27 (×2): qty 1

## 2012-04-27 MED ORDER — FENTANYL CITRATE 0.05 MG/ML IJ SOLN
INTRAMUSCULAR | Status: DC | PRN
  Administered 2012-04-27 (×2): 25 ug via INTRAVENOUS

## 2012-04-27 SURGICAL SUPPLY — 65 items
ADH SKN CLS APL DERMABOND .7 (GAUZE/BANDAGES/DRESSINGS) ×1
BAG SPEC THK2 15X12 ZIP CLS (MISCELLANEOUS) ×1
BAG ZIPLOCK 12X15 (MISCELLANEOUS) ×2 IMPLANT
BANDAGE ELASTIC 6 VELCRO ST LF (GAUZE/BANDAGES/DRESSINGS) ×2 IMPLANT
BANDAGE ESMARK 6X9 LF (GAUZE/BANDAGES/DRESSINGS) ×1 IMPLANT
BLADE SAW SGTL 13.0X1.19X90.0M (BLADE) ×2 IMPLANT
BNDG CMPR 9X6 STRL LF SNTH (GAUZE/BANDAGES/DRESSINGS) ×1
BNDG ESMARK 6X9 LF (GAUZE/BANDAGES/DRESSINGS) ×2
BOWL SMART MIX CTS (DISPOSABLE) ×2 IMPLANT
CEMENT HV SMART SET (Cement) ×2 IMPLANT
CLOTH BEACON ORANGE TIMEOUT ST (SAFETY) ×2 IMPLANT
CUFF TOURN SGL QUICK 34 (TOURNIQUET CUFF) ×2
CUFF TRNQT CYL 34X4X40X1 (TOURNIQUET CUFF) ×1 IMPLANT
DECANTER SPIKE VIAL GLASS SM (MISCELLANEOUS) ×2 IMPLANT
DERMABOND ADVANCED (GAUZE/BANDAGES/DRESSINGS) ×1
DERMABOND ADVANCED .7 DNX12 (GAUZE/BANDAGES/DRESSINGS) ×1 IMPLANT
DRAPE EXTREMITY T 121X128X90 (DRAPE) ×2 IMPLANT
DRAPE POUCH INSTRU U-SHP 10X18 (DRAPES) ×2 IMPLANT
DRAPE U-SHAPE 47X51 STRL (DRAPES) ×2 IMPLANT
DRSG AQUACEL AG ADV 3.5X10 (GAUZE/BANDAGES/DRESSINGS) ×2 IMPLANT
DRSG TEGADERM 4X4.75 (GAUZE/BANDAGES/DRESSINGS) ×2 IMPLANT
DURAPREP 26ML APPLICATOR (WOUND CARE) ×2 IMPLANT
ELECT REM PT RETURN 9FT ADLT (ELECTROSURGICAL) ×2
ELECTRODE REM PT RTRN 9FT ADLT (ELECTROSURGICAL) ×1 IMPLANT
EVACUATOR 1/8 PVC DRAIN (DRAIN) ×2 IMPLANT
FACESHIELD LNG OPTICON STERILE (SAFETY) ×10 IMPLANT
GAUZE SPONGE 2X2 8PLY STRL LF (GAUZE/BANDAGES/DRESSINGS) ×1 IMPLANT
GLOVE BIOGEL PI IND STRL 6.5 (GLOVE) IMPLANT
GLOVE BIOGEL PI IND STRL 7.5 (GLOVE) ×1 IMPLANT
GLOVE BIOGEL PI IND STRL 8 (GLOVE) ×1 IMPLANT
GLOVE BIOGEL PI INDICATOR 6.5 (GLOVE) ×1
GLOVE BIOGEL PI INDICATOR 7.5 (GLOVE)
GLOVE BIOGEL PI INDICATOR 8 (GLOVE) ×1
GLOVE ECLIPSE 8.0 STRL XLNG CF (GLOVE) ×2 IMPLANT
GLOVE ORTHO TXT STRL SZ7.5 (GLOVE) ×4 IMPLANT
GLOVE SURG SS PI 6.5 STRL IVOR (GLOVE) ×3 IMPLANT
GLOVE SURG SS PI 7.5 STRL IVOR (GLOVE) ×3 IMPLANT
GOWN BRE IMP PREV XXLGXLNG (GOWN DISPOSABLE) ×2 IMPLANT
GOWN STRL NON-REIN LRG LVL3 (GOWN DISPOSABLE) ×2 IMPLANT
GOWN STRL REIN XL XLG (GOWN DISPOSABLE) ×2 IMPLANT
HANDPIECE INTERPULSE COAX TIP (DISPOSABLE) ×2
IMMOBILIZER KNEE 20 (SOFTGOODS) ×4
IMMOBILIZER KNEE 20 THIGH 36 (SOFTGOODS) IMPLANT
KIT BASIN OR (CUSTOM PROCEDURE TRAY) ×2 IMPLANT
MANIFOLD NEPTUNE II (INSTRUMENTS) ×2 IMPLANT
NDL SAFETY ECLIPSE 18X1.5 (NEEDLE) ×1 IMPLANT
NEEDLE HYPO 18GX1.5 SHARP (NEEDLE) ×2
NS IRRIG 1000ML POUR BTL (IV SOLUTION) ×3 IMPLANT
PACK TOTAL JOINT (CUSTOM PROCEDURE TRAY) ×2 IMPLANT
POSITIONER SURGICAL ARM (MISCELLANEOUS) ×2 IMPLANT
SET HNDPC FAN SPRY TIP SCT (DISPOSABLE) ×1 IMPLANT
SET PAD KNEE POSITIONER (MISCELLANEOUS) ×2 IMPLANT
SPONGE GAUZE 2X2 STER 10/PKG (GAUZE/BANDAGES/DRESSINGS) ×1
SUCTION FRAZIER 12FR DISP (SUCTIONS) ×2 IMPLANT
SUT MNCRL AB 4-0 PS2 18 (SUTURE) ×2 IMPLANT
SUT VIC AB 1 CT1 36 (SUTURE) ×6 IMPLANT
SUT VIC AB 2-0 CT1 27 (SUTURE) ×2
SUT VIC AB 2-0 CT1 TAPERPNT 27 (SUTURE) ×3 IMPLANT
SUT VLOC 180 0 24IN GS25 (SUTURE) ×1 IMPLANT
SYR 50ML LL SCALE MARK (SYRINGE) ×2 IMPLANT
TOWEL OR 17X26 10 PK STRL BLUE (TOWEL DISPOSABLE) ×3 IMPLANT
TOWEL OR NON WOVEN STRL DISP B (DISPOSABLE) ×1 IMPLANT
TRAY FOLEY CATH 14FRSI W/METER (CATHETERS) ×2 IMPLANT
WATER STERILE IRR 1500ML POUR (IV SOLUTION) ×2 IMPLANT
WRAP KNEE MAXI GEL POST OP (GAUZE/BANDAGES/DRESSINGS) ×2 IMPLANT

## 2012-04-27 NOTE — Anesthesia Preprocedure Evaluation (Signed)
Anesthesia Evaluation  Patient identified by MRN, date of birth, ID band Patient awake    Reviewed: Allergy & Precautions, H&P , NPO status , Patient's Chart, lab work & pertinent test results  Airway Mallampati: II TM Distance: >3 FB Neck ROM: full    Dental No notable dental hx.    Pulmonary neg pulmonary ROS,  breath sounds clear to auscultation  Pulmonary exam normal       Cardiovascular Exercise Tolerance: Good hypertension, Pt. on medications Rhythm:regular Rate:Normal     Neuro/Psych negative neurological ROS  negative psych ROS   GI/Hepatic negative GI ROS, Neg liver ROS, GERD-  Medicated and Controlled,  Endo/Other  negative endocrine ROS  Renal/GU negative Renal ROS  negative genitourinary   Musculoskeletal   Abdominal   Peds  Hematology negative hematology ROS (+)   Anesthesia Other Findings   Reproductive/Obstetrics negative OB ROS                           Anesthesia Physical Anesthesia Plan  ASA: II  Anesthesia Plan: Spinal   Post-op Pain Management:    Induction:   Airway Management Planned:   Additional Equipment:   Intra-op Plan:   Post-operative Plan:   Informed Consent: I have reviewed the patients History and Physical, chart, labs and discussed the procedure including the risks, benefits and alternatives for the proposed anesthesia with the patient or authorized representative who has indicated his/her understanding and acceptance.   Dental Advisory Given  Plan Discussed with: CRNA and Surgeon  Anesthesia Plan Comments:         Anesthesia Quick Evaluation

## 2012-04-27 NOTE — Transfer of Care (Signed)
Immediate Anesthesia Transfer of Care Note  Patient: [name]  Procedure(s) Performed: Procedure(s) (LRB) with comments: TOTAL KNEE ARTHROPLASTY (Right)  Patient Location: PACU  Anesthesia Type:Spinal  Level of Consciousness: awake, alert  and patient cooperative  Airway & Oxygen Therapy: Patient Spontanous Breathing and Patient connected to face mask oxygen  Post-op Assessment: Report given to PACU RN and Post -op Vital signs reviewed and stable  Post vital signs: Reviewed and stable  Complications: No apparent anesthesia complications

## 2012-04-27 NOTE — Anesthesia Postprocedure Evaluation (Signed)
  Anesthesia Post-op Note  Patient: [name]  Procedure(s) Performed: Procedure(s) (LRB): TOTAL KNEE ARTHROPLASTY (Right)  Patient Location: PACU  Anesthesia Type: Spinal  Level of Consciousness: awake and alert   Airway and Oxygen Therapy: Patient Spontanous Breathing  Post-op Pain: mild  Post-op Assessment: Post-op Vital signs reviewed, Patient's Cardiovascular Status Stable, Respiratory Function Stable, Patent Airway and No signs of Nausea or vomiting  Post-op Vital Signs: stable  Complications: No apparent anesthesia complications

## 2012-04-27 NOTE — Progress Notes (Signed)
Utilization review completed.  

## 2012-04-27 NOTE — Anesthesia Procedure Notes (Addendum)
Spinal  Patient location during procedure: OR Start time: 04/27/2012 12:53 PM End time: 04/27/2012 1:00 PM Staffing Performed by: resident/CRNA  Spinal Block Patient position: sitting Approach: midline Location: L3-4 Injection technique: single-shot Needle Needle type: Spinocan  Needle gauge: 22 G Needle length: 9 cm Needle insertion depth: 5 cm  Spinal  Start time: 04/27/2012 12:53 PM Preanesthetic Checklist Completed: patient identified, site marked, surgical consent, pre-op evaluation, timeout performed, IV checked, risks and benefits discussed and monitors and equipment checked Spinal Block Patient position: sitting

## 2012-04-27 NOTE — Op Note (Signed)
NAME:  [name]                      MEDICAL RECORD NO.:  [id number]                             FACILITY:  [facility]      PHYSICIAN:  Madlyn Frankel. Charlann Boxer, M.D.  DATE OF BIRTH:  [birth year]      DATE OF PROCEDURE:  04/27/2012                                     OPERATIVE REPORT         PREOPERATIVE DIAGNOSIS:  Right knee osteoarthritis.      POSTOPERATIVE DIAGNOSIS:  Right knee osteoarthritis.      FINDINGS:  The patient was noted to have complete loss of cartilage and   bone-on-bone arthritis with associated osteophytes in the lateral and patellofemoral compartments of   the knee with a significant synovitis and associated effusion.      PROCEDURE:  Right total knee replacement.      COMPONENTS USED:  DePuy rotating platform posterior stabilized knee   system, a size 4N femur, 4 tibia, 10 mm PS insert, and 38 patellar   button.      SURGEON:  Madlyn Frankel. Charlann Boxer, M.D.      ASSISTANT:  Lanney Gins, PA-C.      ANESTHESIA:  Spinal.      SPECIMENS:  None.      COMPLICATION:  None.      DRAINS:  One Hemovac.  EBL: <100cc      TOURNIQUET TIME:   Total Tourniquet Time Documented: Thigh (Right) - 33 minutes .      The patient was stable to the recovery room.      INDICATION FOR PROCEDURE:  [name] is a 76 y.o. female patient of   mine.  The patient had been seen, evaluated, and treated conservatively in the   office with medication, activity modification, and injections.  The patient had   radiographic changes of bone-on-bone arthritis with endplate sclerosis and osteophytes noted.      The patient failed conservative measures including medication, injections, and activity modification, and at this point was ready for more definitive measures.   Based on the radiographic changes and failed conservative measures, the patient   decided to proceed with total knee replacement.  Risks of infection,   DVT, component failure, need for revision surgery, postop  course, and   expectations were all   discussed and reviewed.  Consent was obtained for benefit of pain   relief.      PROCEDURE IN DETAIL:  The patient was brought to the operative theater.   Once adequate anesthesia, preoperative antibiotics, 2 gm of Ancef administered, the patient was positioned supine with the right thigh tourniquet placed.  The  right lower extremity was prepped and draped in sterile fashion.  A time-   out was performed identifying the patient, planned procedure, and   extremity.      The right lower extremity was placed in the [facility] leg holder.  The leg was   exsanguinated, tourniquet elevated to 250 mmHg.  A midline incision was   made followed by median parapatellar arthrotomy.  Following initial   exposure, attention was first directed to the patella.  Precut  measurement was noted to be 23 mm.  I resected down to 14 mm and used a   38 patellar button to restore patellar height as well as cover the cut   surface.      The lug holes were drilled and a metal shim was placed to protect the   patella from retractors and saw blades.      At this point, attention was now directed to the femur.  The femoral   canal was opened with a drill, irrigated to try to prevent fat emboli.  An   intramedullary rod was passed at 5 degrees valgus, 10 mm of bone was   resected off the distal femur.  Following this resection, the tibia was   subluxated anteriorly.  Using the extramedullary guide, 8 mm of bone was resected off   the proximal lateral tibia.  We confirmed the gap would be   stable medially and laterally with a 10 mm insert as well as confirmed   the cut was perpendicular in the coronal plane, checking with an alignment rod.      Once this was done, I sized the femur to be a size 4 in the anterior-   posterior dimension, chose a narrow component based on medial and   lateral dimension.  The size 4 rotation block was then pinned in   position anterior referenced  using the C-clamp to set rotation.  The   anterior, posterior, and  chamfer cuts were made without difficulty nor   notching making certain that I was along the anterior cortex to help   with flexion gap stability.      The final box cut was made off the lateral aspect of distal femur.      At this point, the tibia was sized to be a size 4, the size 4 tray was   then pinned in position through the medial third of the tubercle,   drilled, and keel punched.  Trial reduction was now carried with a 4N femur,  4 tibia, a 10 mm insert, and the 38 patella botton.  The knee was brought to   extension, full extension with good flexion stability with the patella   tracking through the trochlea without application of pressure.  Given   all these findings, the trial components removed.  Final components were   opened and cement was mixed.  The knee was irrigated with normal saline   solution and pulse lavage.  The synovial lining was   then injected with 0.25% Marcaine with epinephrine and 1 cc of Toradol,   total of 61 cc.      The knee was irrigated.  Final implants were then cemented onto clean and   dried cut surfaces of bone with the knee brought to extension with a 10   mm trial insert.      Once the cement had fully cured, the excess cement was removed   throughout the knee.  I confirmed I was satisfied with the range of   motion and stability, and the final 10 mm PS insert was chosen.  It was   placed into the knee.      The tourniquet had been let down at 33 minutes.  No significant   hemostasis required.  The medium Hemovac drain was placed deep.  The   extensor mechanism was then reapproximated using #1 Vicryl with the knee   in flexion.  The   remaining wound was closed with 2-0  Vicryl and running 4-0 Monocryl.   The knee was cleaned, dried, dressed sterilely using Dermabond and   Aquacel dressing.  Drain site dressed separately.  The patient was then   brought to recovery room in  stable condition, tolerating the procedure   well.   Please note that Physician Assistant, Danae Orleans, was present for the entirety of the case, and was utilized for pre-operative positioning, peri-operative retractor management, general facilitation of the procedure.  He was also utilized for primary wound closure at the end of the case.              Pietro Cassis Alvan Dame, M.D.

## 2012-04-27 NOTE — Interval H&P Note (Signed)
History and Physical Interval Note:  04/27/2012 12:09 PM  [name]  has presented today for surgery, with the diagnosis of right knee osteoarthritis  The various methods of treatment have been discussed with the patient and family. After consideration of risks, benefits and other options for treatment, the patient has consented to  Procedure(s) (LRB) with comments: TOTAL KNEE ARTHROPLASTY (Right) as a surgical intervention .  The patient's history has been reviewed, patient examined, no change in status, stable for surgery.  I have reviewed the patient's chart and labs.  Questions were answered to the patient's satisfaction.     Shelda Pal

## 2012-04-28 ENCOUNTER — Encounter (HOSPITAL_COMMUNITY): Payer: Self-pay | Admitting: Orthopedic Surgery

## 2012-04-28 DIAGNOSIS — E871 Hypo-osmolality and hyponatremia: Secondary | ICD-10-CM

## 2012-04-28 DIAGNOSIS — D500 Iron deficiency anemia secondary to blood loss (chronic): Secondary | ICD-10-CM

## 2012-04-28 DIAGNOSIS — E663 Overweight: Secondary | ICD-10-CM

## 2012-04-28 LAB — CBC
HCT: 30.2 % — ABNORMAL LOW (ref 36.0–46.0)
Hemoglobin: 9.5 g/dL — ABNORMAL LOW (ref 12.0–15.0)
WBC: 10.3 10*3/uL (ref 4.0–10.5)

## 2012-04-28 LAB — BASIC METABOLIC PANEL
BUN: 13 mg/dL (ref 6–23)
CO2: 28 mEq/L (ref 19–32)
Chloride: 97 mEq/L (ref 96–112)
Glucose, Bld: 85 mg/dL (ref 70–99)
Potassium: 3.7 mEq/L (ref 3.5–5.1)

## 2012-04-28 NOTE — Progress Notes (Signed)
Physical Therapy Treatment Patient Details Name: [name] MRN: [id number] DOB: September 11, 1935 Today's Date: 04/28/2012 Time: 4098-1191 PT Time Calculation (min): 23 min  PT Assessment / Plan / Recommendation Comments on Treatment Session       Follow Up Recommendations  Home health PT     Does the patient have the potential to tolerate intense rehabilitation     Barriers to Discharge        Equipment Recommendations  Rolling walker with 5" wheels    Recommendations for Other Services OT consult  Frequency 7X/week   Plan      Precautions / Restrictions Precautions Precautions: Knee Required Braces or Orthoses: Knee Immobilizer - Right Knee Immobilizer - Right: Discontinue once straight leg raise with < 10 degree lag Restrictions Weight Bearing Restrictions: No RLE Weight Bearing: Weight bearing as tolerated   Pertinent Vitals/Pain 10/10 (with a smile); premedicated, ice packs provided    Mobility  Bed Mobility Bed Mobility: Sit to Supine Sit to Supine: 4: Min assist;3: Mod assist Details for Bed Mobility Assistance: cues for sequence and use of L LE to self assist Transfers Transfers: Sit to Stand;Stand to Sit Sit to Stand: 4: Min assist;3: Mod assist Stand to Sit: 4: Min assist;3: Mod assist Details for Transfer Assistance: cues for LE management and use of UEs to self assist; stand/pvt bed to [facility] with RW and +2 assist Ambulation/Gait Ambulation/Gait Assistance: 1: +2 Total assist Ambulation/Gait: Patient Percentage: 70% Ambulation Distance (Feet): 34 Feet (and 6) Assistive device: Rolling walker Ambulation/Gait Assistance Details: cues for posture, sequence, position from RW, stride length; distance limited by c/o UE fatigue Gait Pattern: Step-to pattern    Exercises     PT Diagnosis:    PT Problem List:   PT Treatment Interventions:     PT Goals Acute Rehab PT Goals PT Goal Formulation: With patient Time For Goal Achievement: 05/05/12 Potential  to Achieve Goals: Good Pt will go Supine/Side to Sit: with supervision PT Goal: Supine/Side to Sit - Progress: Progressing toward goal Pt will go Sit to Supine/Side: with supervision PT Goal: Sit to Supine/Side - Progress: Progressing toward goal Pt will go Sit to Stand: with supervision PT Goal: Sit to Stand - Progress: Progressing toward goal Pt will go Stand to Sit: with supervision PT Goal: Stand to Sit - Progress: Progressing toward goal Pt will Ambulate: 51 - 150 feet;with supervision;with rolling walker PT Goal: Ambulate - Progress: Progressing toward goal Pt will Go Up / Down Stairs: 1-2 stairs;with min assist;with least restrictive assistive device PT Goal: Up/Down Stairs - Progress: Goal set today  Visit Information  Last PT Received On: 04/28/12 Assistance Needed: +2    Subjective Data  Subjective: My arms are giving out, they just burn Patient Stated Goal: Resume previous lifestyle with decreased pain   Cognition  Overall Cognitive Status: Appears within functional limits for tasks assessed/performed Arousal/Alertness: Awake/alert Orientation Level: Appears intact for tasks assessed Behavior During Session: [facility] for tasks performed    Balance     End of Session PT - End of Session Equipment Utilized During Treatment: Gait belt;Right knee immobilizer Activity Tolerance: Patient limited by fatigue;Patient limited by pain Patient left: in bed;with call bell/phone within reach;with family/visitor present Nurse Communication: Mobility status   GP     [name] 04/28/2012, 3:37 PM

## 2012-04-28 NOTE — Evaluation (Signed)
Physical Therapy Evaluation Patient Details Name: [name] MRN: [id number] DOB: 05/19/1936 Today's Date: 04/28/2012 Time: 2725-3664 PT Time Calculation (min): 51 min  PT Assessment / Plan / Recommendation Clinical Impression  Pt s/p R TKR presents with decreased R LE strength/ROM limiting functional mobility    PT Assessment  Patient needs continued PT services    Follow Up Recommendations  Home health PT    Does the patient have the potential to tolerate intense rehabilitation      Barriers to Discharge        Equipment Recommendations  Rolling walker with 5" wheels    Recommendations for Other Services OT consult   Frequency 7X/week    Precautions / Restrictions Precautions Precautions: Knee Required Braces or Orthoses: Knee Immobilizer - Right Knee Immobilizer - Right: Discontinue once straight leg raise with < 10 degree lag Restrictions Weight Bearing Restrictions: No RLE Weight Bearing: Weight bearing as tolerated   Pertinent Vitals/Pain 10/10 with WB; pt premedicated, ice packs provided, RN aware      Mobility  Bed Mobility Bed Mobility: Supine to Sit Supine to Sit: 4: Min assist Details for Bed Mobility Assistance: cues for sequence and use of L LE to self assist Transfers Transfers: Sit to Stand;Stand to Sit Sit to Stand: 1: +2 Total assist Sit to Stand: Patient Percentage: 70% Stand to Sit: 1: +2 Total assist Stand to Sit: Patient Percentage: 70% Stand Pivot Transfers: 1: +2 Total assist Stand Pivot Transfers: Patient Percentage: 60% Details for Transfer Assistance: cues for LE management and use of UEs to self assist; stand/pvt bed to [facility] with RW and +2 assist Ambulation/Gait Ambulation/Gait Assistance: 1: +2 Total assist Ambulation/Gait: Patient Percentage: 70% Ambulation Distance (Feet): 17 Feet Assistive device: Rolling walker Ambulation/Gait Assistance Details: Cues for posture, position from RW, stride length, and sequence Gait  Pattern: Step-to pattern    Shoulder Instructions     Exercises Total Joint Exercises Ankle Circles/Pumps: AROM;10 reps;Both;Supine Quad Sets: AROM;10 reps;Both;Supine Heel Slides: AAROM;10 reps;Right;Supine Straight Leg Raises: AAROM;10 reps;Supine;Right   PT Diagnosis: Difficulty walking  PT Problem List:   PT Treatment Interventions: DME instruction;Gait training;Stair training;Functional mobility training;Therapeutic activities;Therapeutic exercise;Patient/family education   PT Goals Acute Rehab PT Goals PT Goal Formulation: With patient Time For Goal Achievement: 05/05/12 Potential to Achieve Goals: Good Pt will go Supine/Side to Sit: with supervision PT Goal: Supine/Side to Sit - Progress: Goal set today Pt will go Sit to Supine/Side: with supervision PT Goal: Sit to Supine/Side - Progress: Goal set today Pt will go Sit to Stand: with supervision PT Goal: Sit to Stand - Progress: Goal set today Pt will go Stand to Sit: with supervision PT Goal: Stand to Sit - Progress: Goal set today Pt will Ambulate: 51 - 150 feet;with supervision;with rolling walker PT Goal: Ambulate - Progress: Goal set today Pt will Go Up / Down Stairs: 1-2 stairs;with min assist;with least restrictive assistive device PT Goal: Up/Down Stairs - Progress: Goal set today  Visit Information  Last PT Received On: 04/28/12 Assistance Needed: +2    Subjective Data  Subjective: Everyone says I will be so happy I had this done Patient Stated Goal: Resume previous lifestyle with decreased pain   Prior Functioning  Home Living Lives With: Spouse Available Help at Discharge: Family Type of Home: House Home Access: Stairs to enter Secretary/administrator of Steps: 2 Entrance Stairs-Rails: Right Home Layout: One level Home Adaptive Equipment: Walker - four wheeled Prior Function Level of Independence: Independent Able to Take  Stairs?: Yes Driving: Yes Vocation: Retired Musician:  No difficulties Dominant Hand: Right    Cognition  Overall Cognitive Status: Appears within functional limits for tasks assessed/performed Arousal/Alertness: Awake/alert Orientation Level: Appears intact for tasks assessed Behavior During Session: [facility] for tasks performed    Extremity/Trunk Assessment Right Upper Extremity Assessment RUE ROM/Strength/Tone: [facility] for tasks assessed Left Upper Extremity Assessment LUE ROM/Strength/Tone: WFL for tasks assessed Right Lower Extremity Assessment RLE ROM/Strength/Tone: Deficits RLE ROM/Strength/Tone Deficits: Quads 3-/5 with AAROM at knee -10 - 75 Left Lower Extremity Assessment LLE ROM/Strength/Tone: WFL for tasks assessed   Balance    End of Session PT - End of Session Equipment Utilized During Treatment: Gait belt;Right knee immobilizer Activity Tolerance: Patient limited by fatigue;Patient limited by pain Patient left: in chair;with call bell/phone within reach Nurse Communication: Mobility status  GP     Vaunda Gutterman 04/28/2012, 12:45 PM

## 2012-04-28 NOTE — Progress Notes (Signed)
   Subjective: 1 Day Post-Op Procedure(s) (LRB): TOTAL KNEE ARTHROPLASTY (Right)   Patient reports pain as mild, pain well controlled. No events throughout the night.   Objective:   VITALS:   Filed Vitals:   04/28/12 0610  BP: 147/77  Pulse: 62  Temp: 97.4 F (36.3 C)  Resp: 14    Neurovascular intact Dorsiflexion/Plantar flexion intact Incision: dressing C/D/I No cellulitis present Compartment soft  LABS  Basename 04/28/12 0433  HGB 9.5*  HCT 30.2*  WBC 10.3  PLT 157     Basename 04/28/12 0433  NA 130*  K 3.7  BUN 13  CREATININE 0.85  GLUCOSE 85     Assessment/Plan: 1 Day Post-Op Procedure(s) (LRB): TOTAL KNEE ARTHROPLASTY (Right) HV drain d/c'ed Foley cath d/c'ed Advance diet Up with therapy D/C IV fluids Discharge home with home health eventually, possibly tomorrow if she continues to do well.  Expected ABLA  Treated with iron and will observe  Overweight (BMI 25-29.9) Estimated Body mass index is 26.61 kg/(m^2) as calculated from the following:   Height as of this encounter: 5\' 8" (1.727 m).   Weight as of this encounter: 175 lb(79.379 kg). Patient also counseled that weight may inhibit the healing process Patient counseled that losing weight will help with future health issues  Hyponatremia Treated with IV fluids and will observe     Anastasio Auerbach. Wynonna Fitzhenry   PAC  04/28/2012, 7:50 AM

## 2012-04-29 LAB — CBC
HCT: 27.4 % — ABNORMAL LOW (ref 36.0–46.0)
Hemoglobin: 8.9 g/dL — ABNORMAL LOW (ref 12.0–15.0)
RBC: 3.5 MIL/uL — ABNORMAL LOW (ref 3.87–5.11)

## 2012-04-29 LAB — BASIC METABOLIC PANEL
BUN: 16 mg/dL (ref 6–23)
CO2: 26 mEq/L (ref 19–32)
Glucose, Bld: 181 mg/dL — ABNORMAL HIGH (ref 70–99)
Potassium: 3.7 mEq/L (ref 3.5–5.1)
Sodium: 129 mEq/L — ABNORMAL LOW (ref 135–145)

## 2012-04-29 NOTE — Progress Notes (Signed)
Physical Therapy Treatment Patient Details Name: IZUMI CUADRADO MRN: [id number] DOB: 1935-08-02 Today's Date: 04/29/2012 Time: 4098-1191 PT Time Calculation (min): 33 min  PT Assessment / Plan / Recommendation Comments on Treatment Session       Follow Up Recommendations  Home health PT     Does the patient have the potential to tolerate intense rehabilitation     Barriers to Discharge        Equipment Recommendations  Rolling walker with 5" wheels    Recommendations for Other Services OT consult  Frequency 7X/week   Plan Discharge plan remains appropriate    Precautions / Restrictions Precautions Precautions: Knee Required Braces or Orthoses: Knee Immobilizer - Right Knee Immobilizer - Right: Discontinue once straight leg raise with < 10 degree lag Restrictions Weight Bearing Restrictions: No RLE Weight Bearing: Weight bearing as tolerated   Pertinent Vitals/Pain 5/10; premedicated, cold packs provided    Mobility  Bed Mobility Bed Mobility: Sit to Supine Supine to Sit: 4: Min assist Details for Bed Mobility Assistance: cues for sequence and use of L LE to self assist Transfers Transfers: Sit to Stand;Stand to Sit Sit to Stand: 4: Min assist;From bed Stand to Sit: 4: Min assist;To chair/3-in-1 Details for Transfer Assistance: min cues for LE management Ambulation/Gait Ambulation/Gait Assistance: 4: Min assist Ambulation Distance (Feet): 54 Feet (twice) Assistive device: Rolling walker Ambulation/Gait Assistance Details: cues for sequence, stride length, posture, and position from RW Gait Pattern: Step-to pattern    Exercises Total Joint Exercises Ankle Circles/Pumps: AROM;Both;Supine;20 reps Quad Sets: AROM;Both;Supine;20 reps Heel Slides: AAROM;Right;Supine;20 reps Straight Leg Raises: AAROM;Supine;Right;20 reps   PT Diagnosis:    PT Problem List:   PT Treatment Interventions:     PT Goals Acute Rehab PT Goals PT Goal Formulation: With  patient Time For Goal Achievement: 05/05/12 Potential to Achieve Goals: Good Pt will go Supine/Side to Sit: with supervision PT Goal: Supine/Side to Sit - Progress: Progressing toward goal Pt will go Sit to Supine/Side: with supervision PT Goal: Sit to Supine/Side - Progress: Progressing toward goal Pt will go Sit to Stand: with supervision PT Goal: Sit to Stand - Progress: Progressing toward goal Pt will go Stand to Sit: with supervision PT Goal: Stand to Sit - Progress: Progressing toward goal Pt will Ambulate: 51 - 150 feet;with supervision;with rolling walker PT Goal: Ambulate - Progress: Progressing toward goal  Visit Information  Last PT Received On: 04/06/12 Assistance Needed: +1    Subjective Data  Subjective: I am feeling much better than yesterday Patient Stated Goal: Resume previous lifestyle with decreased pain   Cognition  Overall Cognitive Status: Appears within functional limits for tasks assessed/performed Arousal/Alertness: Awake/alert Orientation Level: Appears intact for tasks assessed Behavior During Session: [facility] for tasks performed    Balance     End of Session PT - End of Session Equipment Utilized During Treatment: Gait belt;Right knee immobilizer Activity Tolerance: Patient tolerated treatment well Patient left: in chair;with call bell/phone within reach;with family/visitor present Nurse Communication: Mobility status   GP     Antasia Haider 04/29/2012, 12:11 PM

## 2012-04-29 NOTE — Progress Notes (Signed)
Physical Therapy Treatment Patient Details Name: [name] MRN: [id number] DOB: 1936/05/17 Today's Date: 04/29/2012 Time: 4098-1191 PT Time Calculation (min): 23 min  PT Assessment / Plan / Recommendation Comments on Treatment Session       Follow Up Recommendations  Home health PT     Does the patient have the potential to tolerate intense rehabilitation     Barriers to Discharge        Equipment Recommendations  Rolling walker with 5" wheels    Recommendations for Other Services OT consult  Frequency 7X/week   Plan Discharge plan remains appropriate    Precautions / Restrictions Precautions Precautions: Knee Required Braces or Orthoses: Knee Immobilizer - Right Knee Immobilizer - Right: Discontinue once straight leg raise with < 10 degree lag Restrictions Weight Bearing Restrictions: No RLE Weight Bearing: Weight bearing as tolerated   Pertinent Vitals/Pain     Mobility  Bed Mobility Bed Mobility: Supine to Sit Supine to Sit: 4: Min guard Details for Bed Mobility Assistance: cues for sequence and use of L LE to self assist Transfers Transfers: Sit to Stand;Stand to Sit Sit to Stand: From bed;4: Min guard;From chair/3-in-1 Stand to Sit: 4: Min guard;5: Supervision;To chair/3-in-1 Details for Transfer Assistance: min cues for LE management and use of UEs Ambulation/Gait Ambulation/Gait Assistance: 4: Min guard Ambulation Distance (Feet): 77 Feet (and 20') Assistive device: Rolling walker Ambulation/Gait Assistance Details: cues for stride length, posture and position from RW Gait Pattern: Step-to pattern    Exercises     PT Diagnosis:    PT Problem List:   PT Treatment Interventions:     PT Goals Acute Rehab PT Goals PT Goal Formulation: With patient Time For Goal Achievement: 05/05/12 Potential to Achieve Goals: Good Pt will go Supine/Side to Sit: with supervision PT Goal: Supine/Side to Sit - Progress: Progressing toward goal Pt will go Sit  to Supine/Side: with supervision PT Goal: Sit to Supine/Side - Progress: Progressing toward goal Pt will go Sit to Stand: with supervision PT Goal: Sit to Stand - Progress: Progressing toward goal Pt will go Stand to Sit: with supervision PT Goal: Stand to Sit - Progress: Progressing toward goal Pt will Ambulate: 51 - 150 feet;with supervision;with rolling walker PT Goal: Ambulate - Progress: Progressing toward goal  Visit Information  Last PT Received On: 04/29/12 Assistance Needed: +1    Subjective Data  Patient Stated Goal: Resume previous lifestyle with decreased pain   Cognition  Overall Cognitive Status: Appears within functional limits for tasks assessed/performed Arousal/Alertness: Awake/alert Orientation Level: Appears intact for tasks assessed Behavior During Session: [facility] for tasks performed    Balance     End of Session PT - End of Session Equipment Utilized During Treatment: Gait belt;Right knee immobilizer Activity Tolerance: Patient tolerated treatment well Patient left: in chair;with call bell/phone within reach;with family/visitor present Nurse Communication: Mobility status   GP     [name] 04/29/2012, 4:56 PM

## 2012-04-29 NOTE — Evaluation (Signed)
Occupational Therapy Evaluation Patient Details Name: [name] MRN: [id number] DOB: 13-May-1936 Today's Date: 04/29/2012 Time: 1478-2956 OT Time Calculation (min): 29 min  OT Assessment / Plan / Recommendation Clinical Impression  This 76 year old female was admitted for L TKA.  She is appropriate for skilled OT to increase independence with adls/bathroom transfers while in acute.      OT Assessment  Patient needs continued OT Services    Follow Up Recommendations  No OT follow up    Barriers to Discharge      Equipment Recommendations  Rolling walker with 5" wheels    Recommendations for Other Services    Frequency  Min 2X/week    Precautions / Restrictions Precautions Precautions: Knee Required Braces or Orthoses: Knee Immobilizer - Right Knee Immobilizer - Right: Discontinue once straight leg raise with < 10 degree lag   Pertinent Vitals/Pain No pain    ADL  Grooming: Performed;Supervision/safety Where Assessed - Grooming: Supported standing Upper Body Bathing: Simulated;Set up Where Assessed - Upper Body Bathing: Unsupported sitting Lower Body Bathing: Simulated;Minimal assistance Where Assessed - Lower Body Bathing: Supported sit to stand Upper Body Dressing: Simulated;Set up Where Assessed - Upper Body Dressing: Unsupported sitting Lower Body Dressing: Minimal assistance;Performed Where Assessed - Lower Body Dressing: Supported sit to stand Toilet Transfer: Performed;Min guard Statistician Method: Sit to Barista: Raised toilet seat with arms (or 3-in-1 over toilet) Toileting - Clothing Manipulation and Hygiene: Performed;Min guard Where Assessed - Glass blower/designer Manipulation and Hygiene: Sit to stand from 3-in-1 or toilet Transfers/Ambulation Related to ADLs: ambulated into bathroom with min guard ADL Comments: educated on and used AE:  pt really doesn't need it:  She can reach to feet but cannot quite do ADL due to strain     OT Diagnosis: Generalized weakness  OT Problem List: Decreased strength;Decreased activity tolerance OT Treatment Interventions: Self-care/ADL training;Patient/family education;DME and/or AE instruction   OT Goals Acute Rehab OT Goals OT Goal Formulation: With patient Time For Goal Achievement: 05/06/12 Potential to Achieve Goals: Good ADL Goals Pt Will Transfer to Toilet: with supervision;Ambulation;3-in-1 ADL Goal: Toilet Transfer - Progress: Goal set today Pt Will Perform Toileting - Clothing Manipulation: with supervision;Standing;Sitting on 3-in-1 or toilet ADL Goal: Toileting - Clothing Manipulation - Progress: Goal set today Pt Will Perform Toileting - Hygiene: with supervision;Sit to stand from 3-in-1/toilet ADL Goal: Toileting - Hygiene - Progress: Goal set today  Visit Information  Last OT Received On: 04/29/12 Assistance Needed: +1    Subjective Data  Subjective: I saw one of the sock things but I've never tried it Patient Stated Goal: not to put too much on Wade (husband)   Prior Functioning     Home Living Bathroom Shower/Tub: Engineer, manufacturing systems: Standard Home Adaptive Equipment: Environmental consultant - four wheeled Prior Function Level of Independence: Independent Able to Take Stairs?: Yes Driving: Yes Communication Communication: No difficulties Dominant Hand: Right         Vision/Perception     Cognition  Overall Cognitive Status: Appears within functional limits for tasks assessed/performed Arousal/Alertness: Awake/alert Orientation Level: Appears intact for tasks assessed Behavior During Session: [facility] for tasks performed    Extremity/Trunk Assessment Right Upper Extremity Assessment RUE ROM/Strength/Tone: [facility] for tasks assessed Left Upper Extremity Assessment LUE ROM/Strength/Tone: WFL for tasks assessed (s/p tsa with good results)     Mobility Transfers Sit to Stand: 4: Min guard;From bed;From chair/3-in-1;From elevated  surface Details for Transfer Assistance: min cues for LE management  Shoulder Instructions     Exercise     Balance     End of Session OT - End of Session Activity Tolerance: Patient tolerated treatment well Patient left: in bed;with call bell/phone within reach;with family/visitor present  GO     Kitzia Camus 04/29/2012, 10:04 AM Marica Otter, OTR/L [phone] 04/29/2012

## 2012-04-30 MED ORDER — ASPIRIN EC 325 MG PO TBEC: 325.0000 mg | DELAYED_RELEASE_TABLET | Freq: Two times a day (BID) | ORAL | Status: DC

## 2012-04-30 MED ORDER — DSS 100 MG PO CAPS: 100.0000 mg | ORAL_CAPSULE | Freq: Two times a day (BID) | ORAL | Status: DC

## 2012-04-30 MED ORDER — FERROUS SULFATE 325 (65 FE) MG PO TABS: 325.0000 mg | ORAL_TABLET | Freq: Three times a day (TID) | ORAL | Status: DC

## 2012-04-30 MED ORDER — DIPHENHYDRAMINE HCL 25 MG PO CAPS: 25.0000 mg | ORAL_CAPSULE | Freq: Four times a day (QID) | ORAL | Status: DC | PRN

## 2012-04-30 MED ORDER — METHOCARBAMOL 500 MG PO TABS: 500.0000 mg | ORAL_TABLET | Freq: Four times a day (QID) | ORAL | Status: DC | PRN

## 2012-04-30 MED ORDER — POLYETHYLENE GLYCOL 3350 17 G PO PACK: 17.0000 g | PACK | Freq: Two times a day (BID) | ORAL | Status: DC

## 2012-04-30 MED ORDER — HYDROCODONE-ACETAMINOPHEN 7.5-325 MG PO TABS: 1.0000 | ORAL_TABLET | ORAL | Status: DC

## 2012-04-30 NOTE — Progress Notes (Signed)
Patient ID: [name], female   DOB: 1935/11/01, 76 y.o.   MRN: [id number] Subjective: 3 Days Post-Op Procedure(s) (LRB): TOTAL KNEE ARTHROPLASTY (Right)    Patient reports pain as mild. Doing well, no problems, happy, ready to go home  Objective:   VITALS:   Filed Vitals:   04/30/12 0752  BP:   Pulse:   Temp:   Resp: 14    Neurovascular intact Incision: dressing C/D/I  LABS  Basename 04/29/12 0440 04/28/12 0433  HGB 8.9* 9.5*  HCT 27.4* 30.2*  WBC 13.4* 10.3  PLT 145* 157     Basename 04/29/12 0440 04/28/12 0433  NA 129* 130*  K 3.7 3.7  BUN 16 13  CREATININE 0.85 0.85  GLUCOSE 181* 85    No results found for this basename: LABPT:2,INR:2 in the last 72 hours   Assessment/Plan: 3 Days Post-Op Procedure(s) (LRB): TOTAL KNEE ARTHROPLASTY (Right)   Discharge home with home health F/u 2 weeks

## 2012-04-30 NOTE — Progress Notes (Signed)
   Subjective: 2 Days Post-Op Procedure(s) (LRB): TOTAL KNEE ARTHROPLASTY (Right)   Patient reports pain as mild, pain well controlled. No events throughout the night. Not working as well with PT as she would like.   Objective:   VITALS:   Filed Vitals:   04/29/12 0752  BP: 143/72  Pulse: 73  Temp: 98.2 F (36.8 C)   Resp: 18    Neurovascular intact Dorsiflexion/Plantar flexion intact Incision: dressing C/D/I No cellulitis present Compartment soft  LABS  Basename 04/29/12 0440 04/28/12 0433  HGB 8.9* 9.5*  HCT 27.4* 30.2*  WBC 13.4* 10.3  PLT 145* 157     Basename 04/29/12 0440 04/28/12 0433  NA 129* 130*  K 3.7 3.7  BUN 16 13  CREATININE 0.85 0.85  GLUCOSE 181* 85     Assessment/Plan: 2 Days Post-Op Procedure(s) (LRB): TOTAL KNEE ARTHROPLASTY (Right) Up with therapy Plan for discharge tomorrow if she continues to progress  Expected ABLA  Treated with iron and will observe   Overweight (BMI 25-29.9)  Estimated Body mass index is 26.61 kg/(m^2) as calculated from the following:  Height as of this encounter: 5\' 8" (1.727 m).  Weight as of this encounter: 175 lb(79.379 kg).  Patient also counseled that weight may inhibit the healing process  Patient counseled that losing weight will help with future health issues   Hyponatremia  Treated with IV fluids and will observe    Anastasio Auerbach. Zylan Almquist   PAC  04/30/2012, 10:08 AM

## 2012-04-30 NOTE — Discharge Summary (Signed)
Physician Discharge Summary  Patient ID: [name] MRN: [id number] DOB/AGE: 1936-01-02 76 y.o.  Admit date: 04/27/2012 Discharge date:  04/30/2012  Procedures:  Procedure(s) (LRB): TOTAL KNEE ARTHROPLASTY (Right)  Attending Physician:  Dr. Durene Romans   Admission Diagnoses:   Right knee OA / pain  Discharge Diagnoses:  Principal Problem:  *S/P right TKA Active Problems:  Expected blood loss anemia  Overweight (BMI 25.0-29.9)  Hyponatremia GERD (gastroesophageal reflux disease)   Depression   Hypertension  HPI: [name], 76 y.o. female, has a history of pain and functional disability in the right knee due to arthritis and has failed non-surgical conservative treatments for greater than 12 weeks to includeNSAID's and/or analgesics, corticosteriod injections, use of assistive devices and activity modification. Onset of symptoms was gradual, starting 10 years ago with gradually worsening course since that time. The patient noted no past surgery on the right knee(s). Patient currently rates pain in the right knee(s) at 8 out of 10 with activity. Patient has worsening of pain with activity and weight bearing, pain that interferes with activities of daily living, crepitus and joint swelling. Patient has evidence of periarticular osteophytes and joint space narrowing by imaging studies. Risks, benefits and expectations were discussed with the patient. Patient understand the risks, benefits and expectations and wishes to proceed with surgery.    PCP: Darnelle Bos, MD   Discharged Condition: good  Hospital Course:  Patient underwent the above stated procedure on 04/27/2012. Patient tolerated the procedure well and brought to the recovery room in good condition and subsequently to the floor.  POD #1 BP: 147/77 ; Pulse: 62 ; Temp: 97.4 F (36.3 C) ; Resp: 14  Pt's foley was removed, as well as the hemovac drain removed. IV was changed to a saline  lock. Patient reports pain as mild, pain well controlled. No events throughout the night.  Neurovascular intact, dorsiflexion/plantar flexion intact, incision: dressing C/D/I, no cellulitis present and compartment soft.   LABS  Basename  04/28/12 0433   HGB  9.5  HCT  30.2   POD #2  BP: 143/72 ; Pulse: 73 ; Temp: 98.2 F (36.8 C) ; Resp: 18  Patient reports pain as mild, pain well controlled. No events throughout the night. Not working as well with PT as she would like.  Neurovascular intact, dorsiflexion/plantar flexion intact, incision: dressing C/D/I, no cellulitis present and compartment soft.   LABS  Basename  04/29/12 0440   HGB  8.9  HCT  27.4   POD #3  BP: 184/78 ; Pulse: 98 ; Temp: 98.4 F (36.9 C) ; Resp: 14 Patient reports pain as mild. Doing well, no problems, happy, ready to go home. Neurovascular intact, dorsiflexion/plantar flexion intact, incision: dressing C/D/I, no cellulitis present and compartment soft.   LABS   No new labs   Discharge Exam: General appearance: alert, cooperative and no distress Extremities: Homans sign is negative, no sign of DVT, no edema, redness or tenderness in the calves or thighs and no ulcers, gangrene or trophic changes  Disposition:  Home with follow up in 2 weeks   Follow-up Information    Follow up with Shelda Pal, MD. In 2 weeks.   Contact information:   [address] [place] [address] [phone]          Discharge Orders    Future Orders Please Complete By Expires   Diet - low sodium heart healthy      Call MD / Call 911  Comments:   If you experience chest pain or shortness of breath, CALL 911 and be transported to the hospital emergency room.  If you develope a fever above 101 F, pus (white drainage) or increased drainage or redness at the wound, or calf pain, call your surgeon's office.   Discharge instructions      Comments:   Maintain surgical dressing for 10-14 days, then replace  with gauze and tape. Keep the area dry and clean until follow up. Follow up in 2 weeks at [facility]. Call with any questions or concerns.   Constipation Prevention      Comments:   Drink plenty of fluids.  Prune juice may be helpful.  You may use a stool softener, such as Colace (over the counter) 100 mg twice a day.  Use MiraLax (over the counter) for constipation as needed.   Increase activity slowly as tolerated      Driving restrictions      Comments:   No driving for 4 weeks   TED hose      Comments:   Use stockings (TED hose) for 2 weeks on both leg(s).  You may remove them at night for sleeping.   Change dressing      Comments:   Maintain surgical dressing for 10-14 days, then change the dressing daily with sterile 4 x 4 inch gauze dressing and tape. Keep the area dry and clean.      Current Discharge Medication List    START taking these medications   Details  diphenhydrAMINE (BENADRYL) 25 mg capsule Take 1 capsule (25 mg total) by mouth every 6 (six) hours as needed for itching, allergies or sleep. Qty: 30 capsule    docusate sodium 100 MG CAPS Take 100 mg by mouth 2 (two) times daily. Qty: 10 capsule    ferrous sulfate 325 (65 FE) MG tablet Take 1 tablet (325 mg total) by mouth 3 (three) times daily after meals.    HYDROcodone-acetaminophen (NORCO) 7.5-325 MG per tablet Take 1-2 tablets by mouth every 4 (four) hours. Qty: 120 tablet, Refills: 0    methocarbamol (ROBAXIN) 500 MG tablet Take 1 tablet (500 mg total) by mouth every 6 (six) hours as needed (muscle spasms).    polyethylene glycol (MIRALAX / GLYCOLAX) packet Take 17 g by mouth 2 (two) times daily. Qty: 14 each      CONTINUE these medications which have CHANGED   Details  aspirin EC 325 MG tablet Take 1 tablet (325 mg total) by mouth 2 (two) times daily. X 4 weeks Qty: 60 tablet, Refills: 0      CONTINUE these medications which have NOT CHANGED   Details  hydrochlorothiazide (HYDRODIURIL)  25 MG tablet Take 25 mg by mouth daily with breakfast.     Melatonin 3 MG TABS Take 3-6 mg by mouth at bedtime as needed. Sleep    NONFORMULARY OR COMPOUNDED ITEM Inject 1 each into the muscle every 30 (thirty) days. Vitamin b-12 shot    pantoprazole (PROTONIX) 40 MG tablet Take 40 mg by mouth daily.    Venlafaxine HCl 225 MG TB24 Take 1 tablet by mouth daily with breakfast.         Signed: Anastasio Auerbach. Aimie Wagman   PAC  04/30/2012, 10:05 AM

## 2012-04-30 NOTE — Progress Notes (Signed)
Physical Therapy Treatment Patient Details Name: [name] MRN: [id number] DOB: 1936/04/08 Today's Date: 04/30/2012 Time: 1478-2956 PT Time Calculation (min): 45 min  PT Assessment / Plan / Recommendation Comments on Treatment Session  Reviewed [name]/DOFF with pt and spouse    Follow Up Recommendations  Home health PT     Does the patient have the potential to tolerate intense rehabilitation     Barriers to Discharge        Equipment Recommendations  Rolling walker with 5" wheels    Recommendations for Other Services OT consult  Frequency 7X/week   Plan Discharge plan remains appropriate    Precautions / Restrictions Precautions Precautions: Knee Required Braces or Orthoses: Knee Immobilizer - Right Knee Immobilizer - Right: Discontinue once straight leg raise with < 10 degree lag Restrictions Weight Bearing Restrictions: No RLE Weight Bearing: Weight bearing as tolerated   Pertinent Vitals/Pain 4/10; premedicated, ice pack provided    Mobility  Bed Mobility Bed Mobility: Supine to Sit Supine to Sit: 5: Supervision Transfers Transfers: Sit to Stand;Stand to Sit Sit to Stand: 4: Min guard;From bed;With upper extremity assist Stand to Sit: 4: Min guard;With armrests;To chair/3-in-1 Details for Transfer Assistance: min cues for LE management and use of UEs Ambulation/Gait Ambulation/Gait Assistance: 4: Min guard Ambulation Distance (Feet): 150 Feet Assistive device: Rolling walker Ambulation/Gait Assistance Details: cues for posture, sequence, position from RW Gait Pattern: Step-to pattern Stairs: Yes Stairs Assistance: 4: Min assist;3: Mod assist Stairs Assistance Details (indicate cue type and reason): cues for sequence and foot/crutch placement Stair Management Technique: One rail Left;With crutches;Step to pattern Number of Stairs: 2     Exercises Total Joint Exercises Ankle Circles/Pumps: AROM;Both;Supine;20 reps Quad Sets: AROM;Both;Supine;20  reps Heel Slides: AAROM;Right;Supine;20 reps Straight Leg Raises: AAROM;Supine;Right;20 reps   PT Diagnosis:    PT Problem List:   PT Treatment Interventions:     PT Goals Acute Rehab PT Goals PT Goal Formulation: With patient Time For Goal Achievement: 05/05/12 Potential to Achieve Goals: Good Pt will go Supine/Side to Sit: with supervision PT Goal: Supine/Side to Sit - Progress: Progressing toward goal Pt will go Sit to Supine/Side: with supervision PT Goal: Sit to Supine/Side - Progress: Progressing toward goal Pt will go Sit to Stand: with supervision PT Goal: Sit to Stand - Progress: Progressing toward goal Pt will go Stand to Sit: with supervision PT Goal: Stand to Sit - Progress: Progressing toward goal Pt will Ambulate: 51 - 150 feet;with supervision;with rolling walker PT Goal: Ambulate - Progress: Progressing toward goal Pt will Go Up / Down Stairs: 1-2 stairs;with min assist;with least restrictive assistive device PT Goal: Up/Down Stairs - Progress: Progressing toward goal  Visit Information  Last PT Received On: 04/30/12 Assistance Needed: +1    Subjective Data  Subjective: I fell in the bathroom but I didn't want them to tell you.  I'm ok but my knee burns a little when I bend it Patient Stated Goal: Resume previous lifestyle with decreased pain   Cognition  Overall Cognitive Status: Appears within functional limits for tasks assessed/performed Arousal/Alertness: Awake/alert Orientation Level: Appears intact for tasks assessed Behavior During Session: [name] for tasks performed    Balance     End of Session PT - End of Session Equipment Utilized During Treatment: Gait belt;Right knee immobilizer Activity Tolerance: Patient tolerated treatment well Patient left: in chair;with call bell/phone within reach;with family/visitor present Nurse Communication: Mobility status   GP     [name] 04/30/2012, 12:11 PM

## 2012-04-30 NOTE — Care Management Note (Signed)
    Page 1 of 2   04/30/2012     5:58:05 PM   CARE MANAGEMENT NOTE 04/30/2012  Patient:  [name], [name]   Account Number:  [id number]  Date Initiated:  04/28/2012  Documentation initiated by:  Colleen Can  Subjective/Objective Assessment:   dx rt knee osteoarthritis; total knee replacemnt  Pre-arranged with [facility]     Action/Plan:   Home with hh services. Pt states spouse will be cregiver. Wants [facility] for [facility] services. RW has been delivered to pt's rm. Was arranged by Genevieve Norlander   Anticipated DC Date:  04/30/2012   Anticipated DC Plan:  HOME W HOME HEALTH SERVICES  In-house referral  NA      DC Planning Services  CM consult      [facility] Choice  HOME HEALTH   Choice offered to / List presented to:  C-1 Patient   DME arranged  NA      DME agency  NA     HH arranged  HH-2 PT      Status of service:  Completed, signed off Medicare Important Message given?  NA - LOS <3 / Initial given by admissions (If response is "NO", the following Medicare IM given date fields will be blank) Date Medicare IM given:   Date Additional Medicare IM given:    Discharge Disposition:  HOME W HOME HEALTH SERVICES  Per UR Regulation:    If discussed at Long Length of Stay Meetings, dates discussed:    Comments:  04/30/2012 Dory Peru RN CCM [phone] [facility] will start [facility] services 05/01/2012.

## 2012-04-30 NOTE — Progress Notes (Signed)
Physical Therapy Treatment Patient Details Name: ANAH BILLARD MRN: [id number] DOB: 12/14/1935 Today's Date: 04/30/2012 Time: 4098-1191 PT Time Calculation (min): 36 min  PT Assessment / Plan / Recommendation Comments on Treatment Session  Reviewed use of stool and stepping bkwd into high vehicle    Follow Up Recommendations  Home health PT     Does the patient have the potential to tolerate intense rehabilitation     Barriers to Discharge        Equipment Recommendations  Rolling walker with 5" wheels    Recommendations for Other Services OT consult  Frequency 7X/week   Plan Discharge plan remains appropriate    Precautions / Restrictions Precautions Precautions: Knee Required Braces or Orthoses: Knee Immobilizer - Right Knee Immobilizer - Right: Discontinue once straight leg raise with < 10 degree lag Restrictions Weight Bearing Restrictions: No RLE Weight Bearing: Weight bearing as tolerated   Pertinent Vitals/Pain 4/10    Mobility  Transfers Transfers: Sit to Stand;Stand to Sit Sit to Stand: 5: Supervision Stand to Sit: 5: Supervision Details for Transfer Assistance: min cues for LE management and use of UEs Ambulation/Gait Ambulation/Gait Assistance: 4: Min guard;5: Supervision Ambulation Distance (Feet): 64 Feet Assistive device: Rolling walker Ambulation/Gait Assistance Details: min cues for posture and position from RW Gait Pattern: Step-to pattern Stairs: Yes Stairs Assistance: 4: Min assist Stairs Assistance Details (indicate cue type and reason): cues for sequence, use of crutch and foot/crutch placement Stair Management Technique: One rail Left;With crutches;Step to pattern Number of Stairs: 2  (twice)    Exercises     PT Diagnosis:    PT Problem List:   PT Treatment Interventions:     PT Goals Acute Rehab PT Goals PT Goal Formulation: With patient Time For Goal Achievement: 05/05/12 Potential to Achieve Goals: Good Pt will go  Supine/Side to Sit: with supervision PT Goal: Supine/Side to Sit - Progress: Met Pt will go Sit to Supine/Side: with supervision PT Goal: Sit to Supine/Side - Progress: Progressing toward goal Pt will go Sit to Stand: with supervision PT Goal: Sit to Stand - Progress: Met Pt will go Stand to Sit: with supervision PT Goal: Stand to Sit - Progress: Met Pt will Ambulate: 51 - 150 feet;with supervision;with rolling walker PT Goal: Ambulate - Progress: Progressing toward goal Pt will Go Up / Down Stairs: 1-2 stairs;with min assist;with least restrictive assistive device PT Goal: Up/Down Stairs - Progress: Progressing toward goal  Visit Information  Last PT Received On: 04/30/12 Assistance Needed: +1    Subjective Data  Subjective: I'm doing better Patient Stated Goal: Resume previous lifestyle with decreased pain   Cognition  Overall Cognitive Status: Appears within functional limits for tasks assessed/performed Arousal/Alertness: Awake/alert Orientation Level: Appears intact for tasks assessed Behavior During Session: [facility] for tasks performed    Balance     End of Session PT - End of Session Equipment Utilized During Treatment: Gait belt;Right knee immobilizer Activity Tolerance: Patient limited by fatigue Patient left: in chair;with call bell/phone within reach;with family/visitor present Nurse Communication: Mobility status   GP     Yakub Lodes 04/30/2012, 4:11 PM

## 2012-04-30 NOTE — Progress Notes (Signed)
Receive call from Pt's husband reporting that Mrs. Owensby was sitting on the bathroom floor.  Pt found sitting on bathroom floor. Assessed pt then assisted her to standing without difficulty and ambulated to chair with walker. VSS. Matt Babish PA-C notified at this time. Discussed with patient about calling for assistance in the future, verbalized understanding.  Will continue to monitor

## 2012-08-12 ENCOUNTER — Emergency Department (HOSPITAL_COMMUNITY)
Admission: EM | Admit: 2012-08-12 | Discharge: 2012-08-13 | Disposition: A | Discharge status: Home / Self Care | Payer: Medicare Other | Attending: Emergency Medicine | Admitting: Emergency Medicine

## 2012-08-12 ENCOUNTER — Encounter (HOSPITAL_COMMUNITY): Payer: Self-pay | Admitting: *Deleted

## 2012-08-12 ENCOUNTER — Emergency Department (HOSPITAL_COMMUNITY): Payer: Medicare Other

## 2012-08-12 DIAGNOSIS — I10 Essential (primary) hypertension: Secondary | ICD-10-CM | POA: Insufficient documentation

## 2012-08-12 DIAGNOSIS — Z7982 Long term (current) use of aspirin: Secondary | ICD-10-CM | POA: Insufficient documentation

## 2012-08-12 DIAGNOSIS — R0602 Shortness of breath: Secondary | ICD-10-CM

## 2012-08-12 DIAGNOSIS — Z79899 Other long term (current) drug therapy: Secondary | ICD-10-CM | POA: Insufficient documentation

## 2012-08-12 DIAGNOSIS — R079 Chest pain, unspecified: Secondary | ICD-10-CM | POA: Insufficient documentation

## 2012-08-12 DIAGNOSIS — K219 Gastro-esophageal reflux disease without esophagitis: Secondary | ICD-10-CM | POA: Insufficient documentation

## 2012-08-12 DIAGNOSIS — F329 Major depressive disorder, single episode, unspecified: Secondary | ICD-10-CM | POA: Insufficient documentation

## 2012-08-12 DIAGNOSIS — F3289 Other specified depressive episodes: Secondary | ICD-10-CM | POA: Insufficient documentation

## 2012-08-12 LAB — BASIC METABOLIC PANEL
BUN: 20 mg/dL (ref 6–23)
CO2: 24 mEq/L (ref 19–32)
Calcium: 9 mg/dL (ref 8.4–10.5)
Creatinine, Ser: 0.89 mg/dL (ref 0.50–1.10)
Glucose, Bld: 130 mg/dL — ABNORMAL HIGH (ref 70–99)
Sodium: 136 mEq/L (ref 135–145)

## 2012-08-12 LAB — CBC
HCT: 28.3 % — ABNORMAL LOW (ref 36.0–46.0)
Hemoglobin: 8.9 g/dL — ABNORMAL LOW (ref 12.0–15.0)
MCH: 22.5 pg — ABNORMAL LOW (ref 26.0–34.0)
MCV: 71.6 fL — ABNORMAL LOW (ref 78.0–100.0)
RBC: 3.95 MIL/uL (ref 3.87–5.11)

## 2012-08-12 LAB — D-DIMER, QUANTITATIVE: D-Dimer, Quant: 1.58 ug/mL-FEU — ABNORMAL HIGH (ref 0.00–0.48)

## 2012-08-12 NOTE — ED Provider Notes (Signed)
History     CSN: [id number]  Arrival date & time 08/12/12  1905   First MD Initiated Contact with Patient 08/12/12 2135      Chief Complaint  Patient presents with  . Chest Pain  . Shortness of Breath    HPI [name] is a 77 y.o. female who presents for elevated D-dimer.  Patient reported that she had SOB today while at therapy.  Was not able to do as much as she normally does.  Went in to see PCP today and he was concerned given her recent knee replacement surgery a few months ago.  Labs checked and D-dimer found to be elevated.  Patient sent over here for further evaluation.  No history of DVT/PE.  One episode of CP last week but this lasted no more than 30 seconds.  She never had this pain before and has not had it since.  No CP/SOB at rest.  No other symptoms.  Past Medical History  Diagnosis Date  . GERD (gastroesophageal reflux disease)   . Depression   . Hypertension     Past Surgical History  Procedure Laterality Date  . Bone spur       r thigh  . Total shoulder arthroplasty  09/13/2011    Procedure: TOTAL SHOULDER ARTHROPLASTY;  Surgeon: Verlee Rossetti, MD;  Location: [facility] OR;  Service: Orthopedics;  Laterality: Left;  LEFT SHOULDER REVERSED TOTAL SHOULDER ARTHROPLASTY  . Total knee arthroplasty  04/27/2012    Procedure: TOTAL KNEE ARTHROPLASTY;  Surgeon: Shelda Pal, MD;  Location: [facility] ORS;  Service: Orthopedics;  Laterality: Right;    History reviewed. No pertinent family history.  History  Substance Use Topics  . Smoking status: Never Smoker   . Smokeless tobacco: Never Used  . Alcohol Use: No     Review of Systems  Constitutional: Negative for fever and chills.  HENT: Negative for congestion, rhinorrhea, neck pain and neck stiffness.   Respiratory: Positive for shortness of breath. Negative for cough.   Cardiovascular: Negative for chest pain.  Gastrointestinal: Negative for nausea, vomiting, abdominal pain, diarrhea and abdominal distention.   Endocrine: Negative for polyuria.  Genitourinary: Negative for dysuria.  Skin: Negative for rash.  Neurological: Negative for headaches.  Psychiatric/Behavioral: Negative.   All other systems reviewed and are negative.    Allergies  Codeine; Hydrocodone; Oxycodone; and Tetanus toxoids  Home Medications   Current Outpatient Rx  Name  Route  Sig  Dispense  Refill  . aspirin EC 81 MG tablet   Oral   Take 81 mg by mouth daily.         . Cyanocobalamin 1000 MCG/ML KIT   Injection   Inject 1,000 mcg as directed every 30 (thirty) days.         . diphenhydrAMINE (BENADRYL) 25 mg capsule   Oral   Take 25 mg by mouth at bedtime as needed for itching.         . hydrochlorothiazide (HYDRODIURIL) 25 MG tablet   Oral   Take 25 mg by mouth daily with breakfast.          . Melatonin 3 MG TABS   Oral   Take 3-6 mg by mouth at bedtime as needed. Sleep         . pantoprazole (PROTONIX) 40 MG tablet   Oral   Take 40 mg by mouth daily with breakfast.          . Venlafaxine HCl 225 MG TB24  Oral   Take 225 mg by mouth daily with breakfast.           BP 166/70  Pulse 83  Temp(Src) 98.4 F (36.9 C) (Oral)  Resp 18  SpO2 100%  Physical Exam  Nursing note and vitals reviewed. Constitutional: She is oriented to person, place, and time. She appears well-developed and well-nourished. No distress.  HENT:  Head: Normocephalic and atraumatic.  Right Ear: External ear normal.  Left Ear: External ear normal.  Nose: Nose normal.  Mouth/Throat: Oropharynx is clear and moist. No oropharyngeal exudate.  Eyes: EOM are normal. Pupils are equal, round, and reactive to light.  Neck: Normal range of motion. Neck supple. No tracheal deviation present.  Cardiovascular: Normal rate.   Pulmonary/Chest: Effort normal and breath sounds normal. No stridor. No respiratory distress. She has no wheezes. She has no rales.  Abdominal: Soft. She exhibits no distension. There is no  tenderness. There is no rebound.  Musculoskeletal: Normal range of motion.  Neurological: She is alert and oriented to person, place, and time.  Skin: Skin is warm and dry. She is not diaphoretic.    ED Course  Procedures (including critical care time)  Labs Reviewed  CBC - Abnormal; Notable for the following:    Hemoglobin 8.9 (*)    HCT 28.3 (*)    MCV 71.6 (*)    MCH 22.5 (*)    RDW 18.0 (*)    All other components within normal limits  BASIC METABOLIC PANEL - Abnormal; Notable for the following:    Glucose, Bld 130 (*)    GFR calc non Af Amer 62 (*)    GFR calc Af Amer 72 (*)    All other components within normal limits  PRO B NATRIURETIC PEPTIDE - Abnormal; Notable for the following:    Pro B Natriuretic peptide (BNP) 200.6 (*)    All other components within normal limits  D-DIMER, QUANTITATIVE - Abnormal; Notable for the following:    D-Dimer, Quant 1.58 (*)    All other components within normal limits  POCT I-STAT TROPONIN I   Dg Chest 2 View  08/12/2012  *RADIOLOGY REPORT*  Clinical Data: Chest pain and shortness of breath.  CHEST - 2 VIEW  Comparison: 09/06/2011.  Findings: The cardiac silhouette, mediastinal and hilar contours are normal and stable.  Stable hiatal hernia.  Rounded density in the left lower lung is likely a benign calcified granuloma.  No acute pulmonary findings.  No pleural effusion.  The bony thorax is intact.  IMPRESSION: No acute cardiopulmonary findings.   Original Report Authenticated By: Rudie Meyer, M.D.      Date: 08/12/2012  Rate: 80  Rhythm: normal sinus rhythm  QRS Axis: normal  Intervals: normal  ST/T Wave abnormalities: normal  Conduction Disutrbances:none  Narrative Interpretation:   Old EKG Reviewed: unchanged     1. Shortness of breath       MDM  [name] is a 77 y.o. female who presents to the ED from PCP's office for concern of SOB during exertion and elevated d-dimer.  CT scan ordered.  Patient signed out  to Dr. Fonnie Jarvis to f/u with results and for ultimate disposition.       Arloa Koh, MD 08/13/12 [phone]

## 2012-08-12 NOTE — ED Notes (Signed)
Pt st's she was sent to ED ref. Elevated D-dimer.  St's denies any chest pain or other complaints at this time.  Pt alert and oriented x's 3. Husband at bedside.

## 2012-08-12 NOTE — ED Notes (Signed)
Pt reports having sob for extended amount of time and one episode of left side sharp cp last week, went to pcp and had labs done and d dimer was 2.4 so sent here for further eval. Airway intact at triage.

## 2012-08-13 ENCOUNTER — Emergency Department (HOSPITAL_COMMUNITY): Payer: Medicare Other

## 2012-08-13 MED ORDER — IOHEXOL 350 MG/ML SOLN
80.0000 mL | Freq: Once | INTRAVENOUS | Status: AC | PRN
  Administered 2012-08-13: 80 mL via INTRAVENOUS

## 2012-08-13 NOTE — ED Provider Notes (Signed)
I saw and evaluated the patient, reviewed the resident's note and I agree with the findings and plan.   Madissen Wyse, MD 08/13/12 1630 

## 2012-08-13 NOTE — ED Notes (Signed)
Pt states understanding of discharge instructions 

## 2013-05-28 ENCOUNTER — Ambulatory Visit: Payer: Medicare Other | Admitting: Family Medicine

## 2014-01-27 ENCOUNTER — Ambulatory Visit
Admission: RE | Admit: 2014-01-27 | Discharge: 2014-01-27 | Disposition: A | Discharge status: Home / Self Care | Payer: Medicare Other | Source: Ambulatory Visit | Attending: Internal Medicine | Admitting: Internal Medicine

## 2014-01-27 ENCOUNTER — Other Ambulatory Visit: Payer: Self-pay | Admitting: Internal Medicine

## 2014-01-27 ENCOUNTER — Ambulatory Visit: Admission: RE | Admit: 2014-01-27 | Payer: BC Managed Care – PPO | Source: Ambulatory Visit

## 2014-01-27 DIAGNOSIS — M545 Low back pain, unspecified: Secondary | ICD-10-CM

## 2014-04-22 ENCOUNTER — Encounter: Payer: Self-pay | Admitting: *Deleted

## 2014-04-26 ENCOUNTER — Encounter: Payer: Self-pay | Admitting: *Deleted

## 2014-04-26 ENCOUNTER — Encounter: Payer: Self-pay | Admitting: Cardiology

## 2014-04-26 ENCOUNTER — Ambulatory Visit (INDEPENDENT_AMBULATORY_CARE_PROVIDER_SITE_OTHER): Payer: Medicare Other | Admitting: Cardiology

## 2014-04-26 VITALS — BP 170/76 | HR 74 | Ht 68.0 in | Wt 168.0 lb

## 2014-04-26 DIAGNOSIS — R0600 Dyspnea, unspecified: Secondary | ICD-10-CM

## 2014-04-26 DIAGNOSIS — R0989 Other specified symptoms and signs involving the circulatory and respiratory systems: Secondary | ICD-10-CM | POA: Insufficient documentation

## 2014-04-26 NOTE — Progress Notes (Signed)
HPI The patient presents for evaluation of shortness of breath. She does have a strong family history of coronary disease with her mother having heart disease atan early age. She has not had any heart disease or prior cardiac testing.   She is recently retired as a Pharmacist, hospital. She has been under stress taking care of her husband who has an LVAD and had a resultant stroke. She also about a week and a half ago fell and broke her foot. She has been able to walk on this.  She reports chronic  Dyspnea with shortness of breath walking up about 25 yards up an incline on her driveway.   She says that this has been going on for years and she does not think it is worse. She does not describe any resting shortness of breath. She does not have PND or orthopnea. She does not have palpitations, presyncope or syncope. She has some mild 4 edema on the left side where she broke her foot but no other significant weight gain or edema. She has never exercise very much.  Allergies  Allergen Reactions  . Cozaar [Losartan] Other (See Comments)    Cause pt to lose her taste for foods  . Codeine Itching and Other (See Comments)    Makes feel crazy  PT STATES SHE ALSO CAN NOT TAKE THE SYNTHETIC CODEINES  . Hydrocodone Itching    Tolerates with benadryl  . Oxycodone Itching  . Tetanus Toxoids Swelling and Rash    Current Outpatient Prescriptions  Medication Sig Dispense Refill  . aspirin EC 81 MG tablet Take 81 mg by mouth daily.    . Cyanocobalamin 1000 MCG/ML KIT Inject 1,000 mcg as directed every 30 (thirty) days. B 12 shot    . hydrochlorothiazide (HYDRODIURIL) 25 MG tablet Take 25 mg by mouth daily with breakfast.     . Melatonin 3 MG TABS Take 3-6 mg by mouth at bedtime as needed. Sleep    . pantoprazole (PROTONIX) 40 MG tablet Take 40 mg by mouth daily with breakfast.     . Venlafaxine HCl 225 MG TB24 Take 225 mg by mouth daily with breakfast.     No current facility-administered medications for this visit.     Past Medical History  Diagnosis Date  . GERD (gastroesophageal reflux disease)   . Depression   . Hypertension     Past Surgical History  Procedure Laterality Date  . Bone spur       r thigh  . Total shoulder arthroplasty  09/13/2011    Procedure: TOTAL SHOULDER ARTHROPLASTY;  Surgeon: Augustin Schooling, MD;  Location: [place];  Service: Orthopedics;  Laterality: Left;  LEFT SHOULDER REVERSED TOTAL SHOULDER ARTHROPLASTY  . Total knee arthroplasty  04/27/2012    Procedure: TOTAL KNEE ARTHROPLASTY;  Surgeon: Mauri Pole, MD;  Location: [facility];  Service: Orthopedics;  Laterality: Right;    Family History  Problem Relation Age of Onset  . Heart attack Mother   . Heart disease Mother   . Heart disease Brother     History   Social History  . Marital Status: Married    Spouse Name: N/A    Number of Children: N/A  . Years of Education: N/A   Occupational History  . Not on file.   Social History Main Topics  . Smoking status: Never Smoker   . Smokeless tobacco: Never Used  . Alcohol Use: No  . Drug Use: Not on file  . Sexual Activity: Not  on file   Other Topics Concern  . Not on file   Social History Narrative   Tobacco use cigarettes: Never smoked    Retired Pharmacist, hospital    ROS:   Positive for no nausea, constipation , reflux , osteoporosis , varicose veins.  Otherwise as stated in the HPI and negative for all other systems.  PHYSICAL EXAM BP 170/76 mmHg  Pulse 74  Ht $R'5\' 8"'TD$  (1.727 m)  Wt 168 lb (76.204 kg)  BMI 25.55 kg/m2  PHYSICAL EXAM GEN:  No distress NECK:  No jugular venous distention at 90 degrees, waveform within normal limits, carotid upstroke brisk and symmetric, bilateral carotid bruits, no thyromegaly LYMPHATICS:  No cervical adenopathy LUNGS:  Clear to auscultation bilaterally BACK:  No CVA tenderness CHEST:  Unremarkable HEART:  S1 and S2 within normal limits, no S3, no S4, no clicks, no rubs, 2/6  Right upper sternal border systolic murmur very  brief, no diastolic murmurs ABD:  Positive bowel sounds normal in frequency in pitch, no bruits, no rebound, no guarding, unable to assess midline mass or bruit with the patient seated. EXT:  2 plus pulses throughout, moderate edema, no cyanosis no clubbing SKIN:  No rashes no nodules NEURO:  Cranial nerves II through XII grossly intact, motor grossly intact throughout PSYCH:  Cognitively intact, oriented to person place and time   EKG:  Sinus rhythm, rate 74, axis within normal limits, intervals within normal limits, no acute ST-T wave changes.  04/26/2014   ASSESSMENT AND PLAN  DYPSNEA:   This seems to be a somewhat chronic problem. I'm going to start with a BNP level to have a low threshold given her slight murmur check an echocardiogram. Ultimately I would like to exclude obstructive coronary disease but I think that we can reasonably do this with a POET (Plain Old Exercise Treadmill)  When she is able to walk. If however she has acute worsening of her breathing before that time I would reconsider pharmacologic stress testing.  HTN:   I gave her specific instructions on a blood pressure diary. This may need to be treated further. This certainly could contribute to dyspnea.  BRUITS:  I will order carotid Dopplers.

## 2014-04-26 NOTE — Patient Instructions (Signed)
Your physician recommends that you schedule a follow-up appointment in: [place] recommends that you HAVE [facility]  Your physician has requested that you have a carotid duplex. This test is an ultrasound of the carotid arteries in your neck. It looks at blood flow through these arteries that supply the brain with blood. Allow one hour for this exam. There are no restrictions or special instructions.

## 2014-04-27 LAB — BRAIN NATRIURETIC PEPTIDE: Brain Natriuretic Peptide: 156.3 pg/mL — ABNORMAL HIGH (ref 0.0–100.0)

## 2014-05-05 ENCOUNTER — Other Ambulatory Visit: Payer: Self-pay | Admitting: *Deleted

## 2014-05-05 DIAGNOSIS — R7989 Other specified abnormal findings of blood chemistry: Secondary | ICD-10-CM

## 2014-05-05 DIAGNOSIS — R0600 Dyspnea, unspecified: Secondary | ICD-10-CM

## 2014-05-06 ENCOUNTER — Telehealth (HOSPITAL_COMMUNITY): Payer: Self-pay | Admitting: *Deleted

## 2014-05-10 ENCOUNTER — Ambulatory Visit (HOSPITAL_COMMUNITY): Payer: Medicare Other

## 2014-05-10 ENCOUNTER — Ambulatory Visit (HOSPITAL_COMMUNITY)
Admission: RE | Admit: 2014-05-10 | Discharge: 2014-05-10 | Disposition: A | Discharge status: Home / Self Care | Payer: Medicare Other | Source: Ambulatory Visit | Attending: Cardiovascular Disease | Admitting: Cardiovascular Disease

## 2014-05-10 DIAGNOSIS — R0989 Other specified symptoms and signs involving the circulatory and respiratory systems: Secondary | ICD-10-CM | POA: Diagnosis present

## 2014-05-10 NOTE — Progress Notes (Signed)
Carotid Duplex Completed. °Brianna L Mazza,RVT °

## 2014-05-13 ENCOUNTER — Ambulatory Visit (HOSPITAL_COMMUNITY)
Admission: RE | Admit: 2014-05-13 | Discharge: 2014-05-13 | Disposition: A | Discharge status: Home / Self Care | Payer: Medicare Other | Source: Ambulatory Visit | Attending: Cardiology | Admitting: Cardiology

## 2014-05-13 DIAGNOSIS — R0600 Dyspnea, unspecified: Secondary | ICD-10-CM

## 2014-05-13 DIAGNOSIS — I059 Rheumatic mitral valve disease, unspecified: Secondary | ICD-10-CM

## 2014-05-13 DIAGNOSIS — R0602 Shortness of breath: Secondary | ICD-10-CM | POA: Diagnosis not present

## 2014-05-13 DIAGNOSIS — I10 Essential (primary) hypertension: Secondary | ICD-10-CM | POA: Diagnosis not present

## 2014-05-13 DIAGNOSIS — Z8249 Family history of ischemic heart disease and other diseases of the circulatory system: Secondary | ICD-10-CM | POA: Diagnosis not present

## 2014-05-13 DIAGNOSIS — R7989 Other specified abnormal findings of blood chemistry: Secondary | ICD-10-CM

## 2014-05-13 NOTE — Progress Notes (Signed)
2D Echocardiogram Complete.  05/13/2014   [name], [place]

## 2014-05-16 NOTE — Progress Notes (Signed)
Pt. Called voice mail is full and there was no answer at her home number and no machine to leave a message on

## 2014-06-06 ENCOUNTER — Ambulatory Visit: Payer: Medicare Other | Admitting: Cardiology

## 2014-07-18 ENCOUNTER — Ambulatory Visit: Payer: Medicare Other | Admitting: Cardiology

## 2014-08-10 ENCOUNTER — Encounter: Payer: Self-pay | Admitting: Cardiology

## 2014-08-10 ENCOUNTER — Ambulatory Visit (INDEPENDENT_AMBULATORY_CARE_PROVIDER_SITE_OTHER): Payer: Medicare Other | Admitting: Cardiology

## 2014-08-10 VITALS — BP 175/75 | HR 78 | Ht 69.0 in | Wt 159.4 lb

## 2014-08-10 DIAGNOSIS — I10 Essential (primary) hypertension: Secondary | ICD-10-CM

## 2014-08-10 MED ORDER — AMLODIPINE BESYLATE 2.5 MG PO TABS: 2.5000 mg | ORAL_TABLET | Freq: Every day | ORAL | Status: DC

## 2014-08-10 NOTE — Progress Notes (Signed)
HPI The patient presents for evaluation of shortness of breath. She has no past cardiac history. Her previous symptoms are described of her first office note. After this visit I sent her for a BNP which was very slightly elevated at 156. However, she had normal systolic function on her echo. Since then she has continued to have some dyspnea with activity. She is under great distress taking care of her husband has had a stroke and is an LVAD patient. She denies any PND or orthopnea. She denies any decompensated heart failure. She has no chest pressure, neck or arm discomfort. She's had no palpitations, presyncope or syncope.  Allergies  Allergen Reactions  . Cozaar [Losartan] Other (See Comments)    Cause pt to lose her taste for foods  . Codeine Itching and Other (See Comments)    Makes feel crazy  PT STATES SHE ALSO CAN NOT TAKE THE SYNTHETIC CODEINES  . Hydrocodone Itching    Tolerates with benadryl  . Oxycodone Itching  . Tetanus Toxoids Swelling and Rash    Current Outpatient Prescriptions  Medication Sig Dispense Refill  . aspirin EC 81 MG tablet Take 81 mg by mouth daily.    . Cyanocobalamin 1000 MCG/ML KIT Inject 1,000 mcg as directed every 30 (thirty) days. B 12 shot    . hydrochlorothiazide (HYDRODIURIL) 25 MG tablet Take 25 mg by mouth daily with breakfast.     . Melatonin 3 MG TABS Take 3-6 mg by mouth at bedtime as needed. Sleep    . Venlafaxine HCl 225 MG TB24 Take 225 mg by mouth daily with breakfast.     No current facility-administered medications for this visit.    Past Medical History  Diagnosis Date  . GERD (gastroesophageal reflux disease)   . Depression   . Hypertension     Past Surgical History  Procedure Laterality Date  . Bone spur      R thigh  . Total shoulder arthroplasty  09/13/2011    Procedure: TOTAL SHOULDER ARTHROPLASTY;  Surgeon: Augustin Schooling, MD;  Location: [place];  Service: Orthopedics;  Laterality: Left;  LEFT SHOULDER REVERSED TOTAL  SHOULDER ARTHROPLASTY  . Total knee arthroplasty  04/27/2012    Procedure: TOTAL KNEE ARTHROPLASTY;  Surgeon: Mauri Pole, MD;  Location: [facility];  Service: Orthopedics;  Laterality: Right;     ROS:   As stated in the HPI and negative for all other systems.  PHYSICAL EXAM BP 175/75 mmHg  Pulse 78  Ht _0  (1.753 m)  Wt 159 lb 6.4 oz (72.303 kg)  BMI 23.53 kg/m2  GEN:  No distress NECK:  No jugular venous distention waveform within normal limits, carotid upstroke brisk and symmetric, bilateral carotid bruits, no thyromegaly LUNGS:  Clear to auscultation bilaterally BACK:  No CVA tenderness CHEST:  Unremarkable HEART:  S1 and S2 within normal limits, no S3, no S4, no clicks, no rubs, 2/6  Right upper sternal border systolic murmur very brief, no diastolic murmurs ABD:  Positive bowel sounds normal in frequency in pitch, no bruits, no rebound, no guarding EXT:  2 plus pulses throughout, moderate edema, no cyanosis no clubbing    ASSESSMENT AND PLAN  DYPSNEA:   There may be some component of diastolic dysfunction.  We talked about salt restriction. We will treat her blood pressure.  HTN:   I will add Norvasc 2.5 mg daily.  CAROTID STENOSIS: She had 50-69% bilateral stenosis and will have follow-up in 1 year.  I would  like for her to get a cholesterol profile at Dr. Kathline Magic office.

## 2014-08-10 NOTE — Patient Instructions (Signed)
Your physician recommends that you schedule a follow-up appointment in: 3 Months  Your physician has recommended you make the following change in your medication: Start Norvasc 2.5 mg daily

## 2014-09-07 ENCOUNTER — Encounter: Payer: Self-pay | Admitting: Podiatry

## 2014-09-07 ENCOUNTER — Ambulatory Visit (INDEPENDENT_AMBULATORY_CARE_PROVIDER_SITE_OTHER): Payer: Medicare Other | Admitting: Podiatry

## 2014-09-07 DIAGNOSIS — B351 Tinea unguium: Secondary | ICD-10-CM

## 2014-09-07 DIAGNOSIS — M79676 Pain in unspecified toe(s): Secondary | ICD-10-CM

## 2014-09-07 NOTE — Progress Notes (Signed)
   Subjective:    Patient ID: [name], female    DOB: 1936-06-07, 79 y.o.   MRN: [id number]  HPI 79 year old female presents the office today with complaints of painful, thick, elongated, discolored toenails for which she is unable to trim herself. She states that she tries to trim her toenails herself and will cut the skin. She has difficulty has a or thick. She states that she has neuropathy due to B-12 deficiency. She denies any redness or drainage along the nail sites. No other complaints at this time.   Review of Systems  Constitutional: Positive for fatigue.  Gastrointestinal: Positive for abdominal pain and constipation.  Genitourinary: Positive for urgency.  Musculoskeletal: Positive for gait problem.  Allergic/Immunologic: Positive for food allergies.  Neurological: Positive for weakness and numbness.  Psychiatric/Behavioral: The patient is nervous/anxious.        Objective:   Physical Exam AAO 3, NAD DP/PT pulses 1/4 bilaterally, CRT less than 3 seconds Decreased protective sensation with Simms Weinstein monofilament, decreased vibratory sensation, Achilles tendon reflex intact. Nails are hypertrophic, dystrophic, elongated, brittle, discolored with mild incurvation on the nail borders 10. There is no surrounding erythema or drainage. There subjective tenderness on nails 1-5 bilaterally. No open lesions or pre-ulcer lesions identified bilaterally. No other areas of tenderness to bilateral lower extremity. No overlying edema, erythema, increase in warmth. No pain with calf compression, swelling, warmth, erythema.       Assessment & Plan:  79 year old female with symptomatic onychomycosis -Treatment options discussed including alternatives, risks, complications -Nails sharply debrided 10 without complication/bleeding. -Discussed the importance of daily foot inspection. -Follow-up in 3 months or sooner if any palms are to arise. In the meantime encouraged to  call the office with any question, concerns, change in symptoms. Follow-up with PCP for other issues mentioned in the ROS.

## 2014-10-13 ENCOUNTER — Other Ambulatory Visit: Payer: Self-pay | Admitting: Gastroenterology

## 2014-10-13 DIAGNOSIS — R1310 Dysphagia, unspecified: Secondary | ICD-10-CM

## 2014-10-18 ENCOUNTER — Other Ambulatory Visit: Payer: Medicare Other

## 2014-10-19 ENCOUNTER — Ambulatory Visit
Admission: RE | Admit: 2014-10-19 | Discharge: 2014-10-19 | Disposition: A | Discharge status: Home / Self Care | Payer: Medicare Other | Source: Ambulatory Visit | Attending: Gastroenterology | Admitting: Gastroenterology

## 2014-10-19 DIAGNOSIS — R1310 Dysphagia, unspecified: Secondary | ICD-10-CM

## 2014-10-27 ENCOUNTER — Other Ambulatory Visit: Payer: Self-pay | Admitting: Gastroenterology

## 2014-11-03 ENCOUNTER — Encounter (HOSPITAL_COMMUNITY): Payer: Self-pay | Admitting: *Deleted

## 2014-11-07 ENCOUNTER — Other Ambulatory Visit: Payer: Self-pay

## 2014-11-07 ENCOUNTER — Ambulatory Visit (INDEPENDENT_AMBULATORY_CARE_PROVIDER_SITE_OTHER): Payer: Medicare Other | Admitting: Cardiology

## 2014-11-07 VITALS — BP 146/66 | HR 68 | Ht 68.0 in | Wt 160.0 lb

## 2014-11-07 DIAGNOSIS — R0600 Dyspnea, unspecified: Secondary | ICD-10-CM | POA: Diagnosis not present

## 2014-11-07 MED ORDER — FUROSEMIDE 20 MG PO TABS: 10.0000 mg | ORAL_TABLET | Freq: Every day | ORAL | Status: DC | PRN

## 2014-11-07 MED ORDER — FUROSEMIDE 20 MG PO TABS: 20.0000 mg | ORAL_TABLET | Freq: Every day | ORAL | Status: DC

## 2014-11-07 NOTE — Telephone Encounter (Signed)
Rx(s) sent to pharmacy electronically.  

## 2014-11-07 NOTE — Patient Instructions (Addendum)
Your physician recommends that you schedule a follow-up appointment in: 3 months with Dr. Percival Spanish  Take lasix 10 mg  Daily as needed

## 2014-11-07 NOTE — Anesthesia Preprocedure Evaluation (Addendum)
Anesthesia Evaluation  Patient identified by MRN, date of birth, ID band Patient awake    Reviewed: Allergy & Precautions, NPO status , Patient's Chart, lab work & pertinent test results  History of Anesthesia Complications Negative for: history of anesthetic complications  Airway Mallampati: III  TM Distance: >3 FB Neck ROM: Full    Dental no notable dental hx. (+) Dental Advisory Given, Poor Dentition   Pulmonary neg pulmonary ROS,  breath sounds clear to auscultation  Pulmonary exam normal       Cardiovascular hypertension, Pt. on medications + Peripheral Vascular Disease Normal cardiovascular examRhythm:Regular Rate:Normal     Neuro/Psych negative neurological ROS  negative psych ROS   GI/Hepatic Neg liver ROS, GERD-  Medicated and Controlled,  Endo/Other  negative endocrine ROS  Renal/GU negative Renal ROS  negative genitourinary   Musculoskeletal  (+) Arthritis -,   Abdominal   Peds negative pediatric ROS (+)  Hematology negative hematology ROS (+)   Anesthesia Other Findings   Reproductive/Obstetrics negative OB ROS                          Anesthesia Physical Anesthesia Plan  ASA: II  Anesthesia Plan: MAC   Post-op Pain Management:    Induction:   Airway Management Planned: Simple Face Mask and Natural Airway  Additional Equipment:   Intra-op Plan:   Post-operative Plan:   Informed Consent: I have reviewed the patients History and Physical, chart, labs and discussed the procedure including the risks, benefits and alternatives for the proposed anesthesia with the patient or authorized representative who has indicated his/her understanding and acceptance.   Dental advisory given  Plan Discussed with: CRNA  Anesthesia Plan Comments:        Anesthesia Quick Evaluation

## 2014-11-07 NOTE — Progress Notes (Signed)
HPI The patient presents for evaluation of shortness of breath.  A BNP which very slightly elevated at 156 in the past. However, she had normal systolic function on her echo. Since then she has continued to have some dyspnea with activity. She is under great distress taking care of her husband who has had a stroke and is an LVAD patient. She denies any PND or orthopnea. . She has no chest pressure, neck or arm discomfort. She's had no palpitations, presyncope or syncope.  We ambulated her for a short time in the office and she got mildly SOB.  Her oxygen sats however stayed greater than 95%.   Allergies  Allergen Reactions  . Cozaar [Losartan] Other (See Comments)    Cause pt to lose her taste for foods  . Codeine Itching and Other (See Comments)    Makes feel crazy  PT STATES SHE ALSO CAN NOT TAKE THE SYNTHETIC CODEINES  . Hydrocodone Itching    Tolerates with benadryl  . Oxycodone Itching  . Tetanus Toxoids Swelling and Rash    Current Outpatient Prescriptions  Medication Sig Dispense Refill  . amLODipine (NORVASC) 2.5 MG tablet Take 1 tablet (2.5 mg total) by mouth daily. 90 tablet 3  . aspirin EC 81 MG tablet Take 81 mg by mouth daily.    . Cyanocobalamin 1000 MCG/ML KIT Inject 1,000 mcg as directed every 30 (thirty) days. B 12 shot    . hydrochlorothiazide (HYDRODIURIL) 25 MG tablet Take 25 mg by mouth daily with breakfast.     . Melatonin 5 MG TABS Take 2.5 mg by mouth at bedtime.    . Multiple Vitamin (MULTIVITAMIN WITH MINERALS) TABS tablet Take 1 tablet by mouth daily.    . pantoprazole (PROTONIX) 40 MG tablet Take 40 mg by mouth every morning.    . Venlafaxine HCl 225 MG TB24 Take 225 mg by mouth daily with breakfast.     No current facility-administered medications for this visit.    Past Medical History  Diagnosis Date  . GERD (gastroesophageal reflux disease)   . Depression   . Hypertension   . Shortness of breath dyspnea     with exertion  . Arthritis     Past  Surgical History  Procedure Laterality Date  . Bone spur      R thigh  . Total shoulder arthroplasty  09/13/2011    Procedure: TOTAL SHOULDER ARTHROPLASTY;  Surgeon: Verlee Rossetti, MD;  Location: [facility];  Service: Orthopedics;  Laterality: Left;  LEFT SHOULDER REVERSED TOTAL SHOULDER ARTHROPLASTY  . Total knee arthroplasty  04/27/2012    Procedure: TOTAL KNEE ARTHROPLASTY;  Surgeon: Shelda Pal, MD;  Location: [facility];  Service: Orthopedics;  Laterality: Right;  . Eye surgery      bilateral cataract with lens implants  . Tonsillectomy      age 69     ROS:   As stated in the HPI and negative for all other systems.  PHYSICAL EXAM BP 146/66 mmHg  Pulse 68  Ht 5\' 8"  (1.727 m)  Wt 160 lb (72.576 kg)  BMI 24.33 kg/m2  GEN:  No distress NECK:  No jugular venous distention waveform within normal limits, carotid upstroke brisk and symmetric, bilateral carotid bruits, no thyromegaly LUNGS:  Clear to auscultation bilaterally BACK:  No CVA tenderness CHEST:  Unremarkable HEART:  S1 and S2 within normal limits, no S3, no S4, no clicks, no rubs, 2/6  Right upper sternal border systolic murmur very brief, no  diastolic murmurs ABD:  Positive bowel sounds normal in frequency in pitch, no bruits, no rebound, no guarding EXT:  2 plus pulses throughout, moderate edema, no cyanosis no clubbing    ASSESSMENT AND PLAN  DYPSNEA:   She did have a very mildly elevated BNP previously.  Given this I am going to give her a very low dose of Lasix to try to see if this might help her breathing.    HTN:   Her BP is much better.  No change in therapy is indicated.   CAROTID STENOSIS: She had 50-69% bilateral stenosis and will have follow-up in 1 year.  I would like for her to get a cholesterol profile at Dr. Kathline Magic office.

## 2014-11-08 ENCOUNTER — Ambulatory Visit (HOSPITAL_COMMUNITY): Payer: Medicare Other | Admitting: Anesthesiology

## 2014-11-08 ENCOUNTER — Encounter (HOSPITAL_COMMUNITY)
Admission: RE | Disposition: A | Discharge status: Home / Self Care | Payer: Self-pay | Source: Ambulatory Visit | Attending: Gastroenterology

## 2014-11-08 ENCOUNTER — Encounter (HOSPITAL_COMMUNITY): Payer: Self-pay | Admitting: Gastroenterology

## 2014-11-08 ENCOUNTER — Ambulatory Visit (HOSPITAL_COMMUNITY)
Admission: RE | Admit: 2014-11-08 | Discharge: 2014-11-08 | Disposition: A | Discharge status: Home / Self Care | Payer: Medicare Other | Source: Ambulatory Visit | Attending: Gastroenterology | Admitting: Gastroenterology

## 2014-11-08 ENCOUNTER — Encounter: Payer: Self-pay | Admitting: Cardiology

## 2014-11-08 DIAGNOSIS — K449 Diaphragmatic hernia without obstruction or gangrene: Secondary | ICD-10-CM | POA: Insufficient documentation

## 2014-11-08 DIAGNOSIS — K222 Esophageal obstruction: Secondary | ICD-10-CM | POA: Insufficient documentation

## 2014-11-08 DIAGNOSIS — K219 Gastro-esophageal reflux disease without esophagitis: Secondary | ICD-10-CM | POA: Diagnosis not present

## 2014-11-08 DIAGNOSIS — Z79899 Other long term (current) drug therapy: Secondary | ICD-10-CM | POA: Diagnosis not present

## 2014-11-08 DIAGNOSIS — H3530 Unspecified macular degeneration: Secondary | ICD-10-CM | POA: Diagnosis not present

## 2014-11-08 DIAGNOSIS — I10 Essential (primary) hypertension: Secondary | ICD-10-CM | POA: Insufficient documentation

## 2014-11-08 DIAGNOSIS — E538 Deficiency of other specified B group vitamins: Secondary | ICD-10-CM | POA: Diagnosis not present

## 2014-11-08 DIAGNOSIS — Z96612 Presence of left artificial shoulder joint: Secondary | ICD-10-CM | POA: Insufficient documentation

## 2014-11-08 DIAGNOSIS — G629 Polyneuropathy, unspecified: Secondary | ICD-10-CM | POA: Diagnosis not present

## 2014-11-08 DIAGNOSIS — R1319 Other dysphagia: Secondary | ICD-10-CM | POA: Diagnosis present

## 2014-11-08 DIAGNOSIS — Z96651 Presence of right artificial knee joint: Secondary | ICD-10-CM | POA: Insufficient documentation

## 2014-11-08 DIAGNOSIS — M1990 Unspecified osteoarthritis, unspecified site: Secondary | ICD-10-CM | POA: Diagnosis not present

## 2014-11-08 DIAGNOSIS — R12 Heartburn: Secondary | ICD-10-CM | POA: Diagnosis present

## 2014-11-08 DIAGNOSIS — I739 Peripheral vascular disease, unspecified: Secondary | ICD-10-CM | POA: Diagnosis not present

## 2014-11-08 DIAGNOSIS — J45909 Unspecified asthma, uncomplicated: Secondary | ICD-10-CM | POA: Diagnosis not present

## 2014-11-08 HISTORY — DX: Reserved for inherently not codable concepts without codable children: IMO0001

## 2014-11-08 HISTORY — PX: ESOPHAGOGASTRODUODENOSCOPY (EGD) WITH PROPOFOL: SHX5813

## 2014-11-08 HISTORY — DX: Unspecified osteoarthritis, unspecified site: M19.90

## 2014-11-08 SURGERY — ESOPHAGOGASTRODUODENOSCOPY (EGD) WITH PROPOFOL
Anesthesia: Monitor Anesthesia Care

## 2014-11-08 MED ORDER — LACTATED RINGERS IV SOLN
INTRAVENOUS | Status: DC
  Administered 2014-11-08: 1000 mL via INTRAVENOUS

## 2014-11-08 MED ORDER — SODIUM CHLORIDE 0.9 % IJ SOLN
INTRAMUSCULAR | Status: AC
Start: 2014-11-08 — End: 2014-11-08
  Filled 2014-11-08: qty 10

## 2014-11-08 MED ORDER — LIDOCAINE HCL (CARDIAC) 20 MG/ML IV SOLN
INTRAVENOUS | Status: DC | PRN
  Administered 2014-11-08: 100 mg via INTRAVENOUS

## 2014-11-08 MED ORDER — PROPOFOL 10 MG/ML IV BOLUS
INTRAVENOUS | Status: AC
  Filled 2014-11-08: qty 20

## 2014-11-08 MED ORDER — EPHEDRINE SULFATE 50 MG/ML IJ SOLN
INTRAMUSCULAR | Status: AC
  Filled 2014-11-08: qty 1

## 2014-11-08 MED ORDER — SODIUM CHLORIDE 0.9 % IV SOLN: INTRAVENOUS | Status: DC

## 2014-11-08 MED ORDER — PROPOFOL INFUSION 10 MG/ML OPTIME
INTRAVENOUS | Status: DC | PRN
  Administered 2014-11-08: 120 ug/kg/min via INTRAVENOUS

## 2014-11-08 MED ORDER — SIMETHICONE 40 MG/0.6ML PO SUSP
ORAL | Status: AC
  Filled 2014-11-08: qty 0.6

## 2014-11-08 MED ORDER — BUTAMBEN-TETRACAINE-BENZOCAINE 2-2-14 % EX AERO
INHALATION_SPRAY | CUTANEOUS | Status: DC | PRN
  Administered 2014-11-08: 1 via TOPICAL

## 2014-11-08 MED ORDER — LIDOCAINE HCL (CARDIAC) 20 MG/ML IV SOLN
INTRAVENOUS | Status: AC
  Filled 2014-11-08: qty 5

## 2014-11-08 SURGICAL SUPPLY — 15 items

## 2014-11-08 NOTE — Anesthesia Procedure Notes (Signed)
Procedure Name: MAC Date/Time: 11/08/2014 7:28 AM Performed by: Carleene Cooper A Pre-anesthesia Checklist: Patient identified, Timeout performed, Emergency Drugs available, Suction available and Patient being monitored Patient Re-evaluated:Patient Re-evaluated prior to inductionOxygen Delivery Method: Nasal cannula Dental Injury: Teeth and Oropharynx as per pre-operative assessment

## 2014-11-08 NOTE — Op Note (Signed)
Problem: Heartburn and esophageal dysphagia. Barium esophagram showed a large hiatal hernia. Barium tablet swallow was normal. Normal surveillance colonoscopy performed on 09/05/2008  Endoscopist: [name]  Premedication: Propofol administered by anesthesia  Procedure: The patient was placed in the left lateral decubitus position. The Pentax gastroscope was passed through the posterior hypopharynx into the proximal esophagus without difficulty. The hypopharynx, larynx, and vocal cords appeared normal.  Esophagoscopy: The proximal and mid segments of the esophageal mucosa appeared normal. The squamocolumnar junction was noted at approximately 34 cm from the incisor teeth. There was a shallow Schatzki's ring at the esophagogastric junction which did not require esophageal dilation. The distal esophageal mucosa appeared normal without signs of erosive esophagitis or Barrett's esophagus.  Gastroscopy: There was a large hiatal hernia. Retroflexed view of the gastric cardia and fundus was normal. The gastric body, antrum, and pylorus appeared normal.  Duodenoscopy: The duodenal bulb and descending duodenum appeared normal.  Assessment: Chronic gastroesophageal reflux manifested by heartburn and associated with a large hiatal hernia and shallow Schatzki's ring but no signs of erosive esophagitis or Barrett's esophagus.  Recommendation: Continue daily proton pump inhibitor therapy before breakfast to prevent heartburn.

## 2014-11-08 NOTE — Transfer of Care (Signed)
Immediate Anesthesia Transfer of Care Note  Patient: [name]  Procedure(s) Performed: Procedure(s): ESOPHAGOGASTRODUODENOSCOPY (EGD) WITH PROPOFOL (N/A)  Patient Location: PACU and Endoscopy Unit  Anesthesia Type:MAC  Level of Consciousness: awake, oriented, patient cooperative, lethargic and responds to stimulation  Airway & Oxygen Therapy: Patient Spontanous Breathing and Patient connected to nasal cannula oxygen  Post-op Assessment: Report given to RN, Post -op Vital signs reviewed and stable and Patient moving all extremities  Post vital signs: Reviewed and stable  Last Vitals:  Filed Vitals:   11/08/14 0638  BP: 165/65  Pulse: 74  Temp: 36.9 C  Resp: 16    Complications: No apparent anesthesia complications

## 2014-11-08 NOTE — H&P (Signed)
  Problem: Heartburn and esophageal dysphagia. 09/05/2008 normal surveillance colonoscopy. 2004 colonoscopy performed with removal of two diminutive adenomatous polyps  History: The patient is a 79 year old female born Dec 16, 1935. She has chronic gastroesophageal reflux manifested by regurgitation and heartburn.  Last year, the patient was diagnosed with a peripheral neuropathy and vitamin B 12 deficiency. She stop taking pantoprazole 40 mg before breakfast each morning. She stop taking Fosamax approximately one year ago.  The patient is the sole caretaker of her husband who has heart failure and has suffered a stroke. She has been under a great deal of stress.  When the patient developed daily heartburn complicated by solid food dysphagia unassociated with vomiting she was scheduled for a barium esophagram with barium tablet which was performed on 10/19/2014. The barium esophagram showed one third of the stomach lying above the hemidiaphragm but there was no evidence of esophageal obstruction or esophageal stricture formation.  The patient has restarted pantoprazole 40 each morning and tells me her heartburn and esophageal dysphagia have markedly improved.  She is scheduled to undergo diagnostic esophagogastroduodenoscopy today.  Past medical history: Macular degeneration. Depression. Vitamin B 12 deficiency. Axonal neuropathy. Hypertension. Urinary incontinence. History of fecal incontinence with anorectal manometry showing low anal sphincter pressure. Asthmatic bronchitis. Osteoarthritis. Laser retinal surgery. Cataract surgery. Tonsillectomy. Bladder suspension surgery. Left shoulder replacement surgery. Total right knee replacement surgery.  Allergies: Lactose intolerance. Tetanus toxoid. Codeine. Tramadol. Lisinopril.  Exam: The patient is alert and lying comfortably on the endoscopy stretcher. Abdomen is soft and nontender to palpation. Lungs are clear to auscultation. Cardiac exam reveals a  regular rhythm.  Plan: Proceed with diagnostic esophagogastroduodenoscopy with possible esophageal stricture dilation.

## 2014-11-08 NOTE — Anesthesia Postprocedure Evaluation (Signed)
  Anesthesia Post-op Note  Patient: [name]  Procedure(s) Performed: Procedure(s) (LRB): ESOPHAGOGASTRODUODENOSCOPY (EGD) WITH PROPOFOL (N/A)  Patient Location: PACU  Anesthesia Type: MAC  Level of Consciousness: awake and alert   Airway and Oxygen Therapy: Patient Spontanous Breathing  Post-op Pain: mild  Post-op Assessment: Post-op Vital signs reviewed, Patient's Cardiovascular Status Stable, Respiratory Function Stable, Patent Airway and No signs of Nausea or vomiting  Last Vitals:  Filed Vitals:   11/08/14 0820  BP: 162/61  Pulse:   Temp:   Resp:     Post-op Vital Signs: stable   Complications: No apparent anesthesia complications

## 2014-11-09 ENCOUNTER — Encounter (HOSPITAL_COMMUNITY): Payer: Self-pay | Admitting: Gastroenterology

## 2014-12-09 ENCOUNTER — Ambulatory Visit: Payer: Medicare Other | Admitting: Podiatry

## 2014-12-22 ENCOUNTER — Ambulatory Visit: Payer: Medicare Other | Admitting: Podiatry

## 2015-01-31 ENCOUNTER — Ambulatory Visit (INDEPENDENT_AMBULATORY_CARE_PROVIDER_SITE_OTHER): Payer: Medicare Other | Admitting: Podiatry

## 2015-01-31 ENCOUNTER — Encounter: Payer: Self-pay | Admitting: Podiatry

## 2015-01-31 DIAGNOSIS — M79676 Pain in unspecified toe(s): Secondary | ICD-10-CM

## 2015-01-31 DIAGNOSIS — B351 Tinea unguium: Secondary | ICD-10-CM

## 2015-01-31 NOTE — Progress Notes (Signed)
Patient ID: [name], female   DOB: 22-May-1936, 79 y.o.   MRN: [id number]  Complaint:  Visit Type: Patient returns to my office for continued preventative foot care services. Complaint: Patient states" my nails have grown long and thick and become painful to walk and wear shoes". The patient presents for preventative foot care services. No changes to ROS.  She admits to neuropathy from B-12 deficiency years ago.  Podiatric Exam: Vascular: dorsalis pedis and posterior tibial pulses are palpable bilateral. Capillary return is immediate. Temperature gradient is WNL. Skin turgor WNL  Sensorium: Diminished  Semmes Weinstein monofilament test. Normal tactile sensation bilaterally. Nail Exam: Pt has thick disfigured discolored nails with subungual debris noted bilateral entire nail hallux through fifth toenails Ulcer Exam: There is no evidence of ulcer or pre-ulcerative changes or infection. Orthopedic Exam: Muscle tone and strength are WNL. No limitations in general ROM. No crepitus or effusions noted. Foot type and digits show no abnormalities. Bony prominences are unremarkable. Skin: No Porokeratosis. No infection or ulcers  Diagnosis:  Onychomycosis, , Pain in right toe, pain in left toes  Treatment & Plan Procedures and Treatment: Consent by patient was obtained for treatment procedures. The patient understood the discussion of treatment and procedures well. All questions were answered thoroughly reviewed. Debridement of mycotic and hypertrophic toenails, 1 through 5 bilateral and clearing of subungual debris. No ulceration, no infection noted.  Return Visit-Office Procedure: Patient instructed to return to the office for a follow up visit 3 months for continued evaluation and treatment.

## 2015-02-23 ENCOUNTER — Ambulatory Visit: Payer: Medicare Other | Admitting: Cardiology

## 2015-03-03 ENCOUNTER — Ambulatory Visit (INDEPENDENT_AMBULATORY_CARE_PROVIDER_SITE_OTHER): Payer: Medicare Other | Admitting: Cardiology

## 2015-03-03 ENCOUNTER — Encounter: Payer: Self-pay | Admitting: Cardiology

## 2015-03-03 VITALS — BP 146/72 | HR 79 | Ht 68.0 in | Wt 160.0 lb

## 2015-03-03 DIAGNOSIS — R0989 Other specified symptoms and signs involving the circulatory and respiratory systems: Secondary | ICD-10-CM

## 2015-03-03 DIAGNOSIS — R0600 Dyspnea, unspecified: Secondary | ICD-10-CM

## 2015-03-03 MED ORDER — FUROSEMIDE 20 MG PO TABS: 10.0000 mg | ORAL_TABLET | Freq: Every day | ORAL | Status: DC | PRN

## 2015-03-03 NOTE — Progress Notes (Signed)
HPI The patient presents for evaluation of shortness of breath.  A BNP which very slightly elevated at 156 in the past. However, she had normal systolic function on her echo. Since then she has continued to have some dyspnea with activity.  I did give her Lasix last time but for some reason she didn't start this working at the prescription. She says she still having the same amount of dyspnea with exertion such as walking moderate distance on level ground.  She continues to be under great distress taking care of her husband who has had a stroke and is an LVAD patient. She denies any PND or orthopnea. . She has no chest pressure, neck or arm discomfort. She's had no palpitations, presyncope or syncope.    Allergies  Allergen Reactions  . Cozaar [Losartan] Other (See Comments)    Cause pt to lose her taste for foods  . Codeine Itching and Other (See Comments)    Makes feel crazy  PT STATES SHE ALSO CAN NOT TAKE THE SYNTHETIC CODEINES  . Hydrocodone Itching    Tolerates with benadryl  . Oxycodone Itching  . Tetanus Toxoids Swelling and Rash    Current Outpatient Prescriptions  Medication Sig Dispense Refill  . amLODipine (NORVASC) 2.5 MG tablet Take 1 tablet (2.5 mg total) by mouth daily. 90 tablet 3  . aspirin EC 81 MG tablet Take 81 mg by mouth daily.    . Cyanocobalamin 1000 MCG/ML KIT Inject 1,000 mcg as directed every 30 (thirty) days. B 12 shot    . furosemide (LASIX) 20 MG tablet Take 0.5 tablets (10 mg total) by mouth daily as needed for fluid or edema. 15 tablet 5  . hydrochlorothiazide (HYDRODIURIL) 25 MG tablet Take 25 mg by mouth daily with breakfast.     . Melatonin 5 MG TABS Take 2.5 mg by mouth at bedtime.    . Multiple Vitamin (MULTIVITAMIN WITH MINERALS) TABS tablet Take 1 tablet by mouth daily.    . pantoprazole (PROTONIX) 40 MG tablet Take 40 mg by mouth every morning.    . Venlafaxine HCl 225 MG TB24 Take 225 mg by mouth daily with breakfast.     No current  facility-administered medications for this visit.    Past Medical History  Diagnosis Date  . GERD (gastroesophageal reflux disease)   . Depression   . Hypertension   . Shortness of breath dyspnea     with exertion  . Arthritis     Past Surgical History  Procedure Laterality Date  . Bone spur      R thigh  . Total shoulder arthroplasty  09/13/2011    Procedure: TOTAL SHOULDER ARTHROPLASTY;  Surgeon: Verlee Rossetti, MD;  Location: [facility] OR;  Service: Orthopedics;  Laterality: Left;  LEFT SHOULDER REVERSED TOTAL SHOULDER ARTHROPLASTY  . Total knee arthroplasty  04/27/2012    Procedure: TOTAL KNEE ARTHROPLASTY;  Surgeon: Shelda Pal, MD;  Location: [facility] ORS;  Service: Orthopedics;  Laterality: Right;  . Eye surgery      bilateral cataract with lens implants  . Tonsillectomy      age 50  . Esophagogastroduodenoscopy (egd) with propofol N/A 11/08/2014    Procedure: ESOPHAGOGASTRODUODENOSCOPY (EGD) WITH PROPOFOL;  Surgeon: Charolett Bumpers, MD;  Location: [facility] ENDOSCOPY;  Service: Endoscopy;  Laterality: N/A;     ROS:   As stated in the HPI and negative for all other systems.  PHYSICAL EXAM BP 146/72 mmHg  Pulse 79  Ht 5\' 8"  (1.727 m)  Wt 160 lb (72.576 kg)  BMI 24.33 kg/m2  GEN:  No distress NECK:  No jugular venous distention waveform within normal limits, carotid upstroke brisk and symmetric, bilateral carotid bruits, no thyromegaly LUNGS:  Clear to auscultation bilaterally BACK:  No CVA tenderness CHEST:  Unremarkable HEART:  S1 and S2 within normal limits, no S3, no S4, no clicks, no rubs, 2/6  Right upper sternal border systolic murmur very brief, no diastolic murmurs ABD:  Positive bowel sounds normal in frequency in pitch, no bruits, no rebound, no guarding EXT:  2 plus pulses throughout, moderate edema, no cyanosis no clubbing  EKG:  Sinus rhythm, rate 79, axis within normal limits, premature atrial contractions, no acute ST-T wave changes.  03/03/2015  ASSESSMENT AND  PLAN  DYPSNEA:   She did have a very mildly elevated BNP previously.  She did not get her Lasix as she was supposed to get at the last visit.  I will prescribe this again.   HTN:   Her BP is much better.  No change in therapy is indicated.   CAROTID STENOSIS: She had 50-69% bilateral stenosis and will have follow-up in Nov.

## 2015-03-03 NOTE — Patient Instructions (Addendum)
Your physician wants you to follow-up in: 1 Year. You will receive a reminder letter in the mail two months in advance. If you don't receive a letter, please call our office to schedule the follow-up appointment.  Your physician has recommended you make the following change in your medication: Take Lasix 10 mg(1/2 tablets) daily  Your physician has requested that you have a carotid duplex. This test is an ultrasound of the carotid arteries in your neck. It looks at blood flow through these arteries that supply the brain with blood. Allow one hour for this exam. There are no restrictions or special instructions. November

## 2015-04-11 ENCOUNTER — Ambulatory Visit: Payer: Medicare Other | Admitting: Family

## 2015-04-20 ENCOUNTER — Encounter: Payer: Self-pay | Admitting: Family

## 2015-04-20 ENCOUNTER — Ambulatory Visit (INDEPENDENT_AMBULATORY_CARE_PROVIDER_SITE_OTHER): Payer: Medicare Other | Admitting: Family

## 2015-04-20 VITALS — BP 152/70 | HR 68 | Temp 98.2°F | Ht 68.0 in | Wt 159.2 lb

## 2015-04-20 DIAGNOSIS — F32A Depression, unspecified: Secondary | ICD-10-CM

## 2015-04-20 DIAGNOSIS — F329 Major depressive disorder, single episode, unspecified: Secondary | ICD-10-CM | POA: Diagnosis not present

## 2015-04-20 MED ORDER — VENLAFAXINE HCL ER 150 MG PO TB24: 150.0000 mg | ORAL_TABLET | Freq: Every day | ORAL | Status: DC

## 2015-04-20 NOTE — Assessment & Plan Note (Addendum)
Exacerbated symptoms of depression likely related to caregiver stress. Denies thoughts of sucide. Decrease Venlafaxine to 150 mg daily. Continue to work with the Education officer, museum through the [place] program to find support. Discussed importance of using available resources and coping mechanisms to be able to help herself. Follow up in 1 month to determine effectiveness of medication reduction or sooner if needed.

## 2015-04-20 NOTE — Patient Instructions (Signed)
Thank you for choosing [facility].  Summary/Instructions:  Your prescription(s) have been submitted to your pharmacy or been printed and provided for you. Please take as directed and contact our office if you believe you are having problem(s) with the medication(s) or have any questions.  If your symptoms worsen or fail to improve, please contact our office for further instruction, or in case of emergency go directly to the emergency room at the closest medical facility.    Major Depressive Disorder Major depressive disorder is a mental illness. It also may be called clinical depression or unipolar depression. Major depressive disorder usually causes feelings of sadness, hopelessness, or helplessness. Some people with this disorder do not feel particularly sad but lose interest in doing things they used to enjoy (anhedonia). Major depressive disorder also can cause physical symptoms. It can interfere with work, school, relationships, and other normal everyday activities. The disorder varies in severity but is longer lasting and more serious than the sadness we all feel from time to time in our lives. Major depressive disorder often is triggered by stressful life events or major life changes. Examples of these triggers include divorce, loss of your job or home, a move, and the death of a family member or close friend. Sometimes this disorder occurs for no obvious reason at all. People who have family members with major depressive disorder or bipolar disorder are at higher risk for developing this disorder, with or without life stressors. Major depressive disorder can occur at any age. It may occur just once in your life (single episode major depressive disorder). It may occur multiple times (recurrent major depressive disorder). SYMPTOMS People with major depressive disorder have either anhedonia or depressed mood on nearly a daily basis for at least 2 weeks or longer. Symptoms of depressed mood  include:  Feelings of sadness (blue or down in the dumps) or emptiness.  Feelings of hopelessness or helplessness.  Tearfulness or episodes of crying (may be observed by others).  Irritability (children and adolescents). In addition to depressed mood or anhedonia or both, people with this disorder have at least four of the following symptoms:  Difficulty sleeping or sleeping too much.   Significant change (increase or decrease) in appetite or weight.   Lack of energy or motivation.  Feelings of guilt and worthlessness.   Difficulty concentrating, remembering, or making decisions.  Unusually slow movement (psychomotor retardation) or restlessness (as observed by others).   Recurrent wishes for death, recurrent thoughts of self-harm (suicide), or a suicide attempt. People with major depressive disorder commonly have persistent negative thoughts about themselves, other people, and the world. People with severe major depressive disorder may experiencedistorted beliefs or perceptions about the world (psychotic delusions). They also may see or hear things that are not real (psychotic hallucinations). DIAGNOSIS Major depressive disorder is diagnosed through an assessment by your health care provider. Your health care provider will ask aboutaspects of your daily life, such as mood,sleep, and appetite, to see if you have the diagnostic symptoms of major depressive disorder. Your health care provider may ask about your medical history and use of alcohol or drugs, including prescription medicines. Your health care provider also may do a physical exam and blood work. This is because certain medical conditions and the use of certain substances can cause major depressive disorder-like symptoms (secondary depression). Your health care provider also may refer you to a mental health specialist for further evaluation and treatment. TREATMENT It is important to recognize the symptoms of major  depressive disorder and seek treatment. The following treatments can be prescribed for this disorder:   Medicine. Antidepressant medicines usually are prescribed. Antidepressant medicines are thought to correct chemical imbalances in the brain that are commonly associated with major depressive disorder. Other types of medicine may be added if the symptoms do not respond to antidepressant medicines alone or if psychotic delusions or hallucinations occur.  Talk therapy. Talk therapy can be helpful in treating major depressive disorder by providing support, education, and guidance. Certain types of talk therapy also can help with negative thinking (cognitive behavioral therapy) and with relationship issues that trigger this disorder (interpersonal therapy). A mental health specialist can help determine which treatment is best for you. Most people with major depressive disorder do well with a combination of medicine and talk therapy. Treatments involving electrical stimulation of the brain can be used in situations with extremely severe symptoms or when medicine and talk therapy do not work over time. These treatments include electroconvulsive therapy, transcranial magnetic stimulation, and vagal nerve stimulation.   This information is not intended to replace advice given to you by your health care provider. Make sure you discuss any questions you have with your health care provider.   Document Released: 10/05/2012 Document Revised: 07/01/2014 Document Reviewed: 10/05/2012 Elsevier Interactive Patient Education [employer].

## 2015-04-20 NOTE — Progress Notes (Signed)
Subjective:    Patient ID: [name], female    DOB: 1935/07/11, 79 y.o.   MRN: [id number]  Chief Complaint  Patient presents with  . Establish Care    Depression    HPI:  [name] is a 79 y.o. female who  has a past medical history of GERD (gastroesophageal reflux disease); Depression; Hypertension; Shortness of breath dyspnea; and Arthritis. and presents today for an office visit to establish care.   1.) Depression - Previously diagnosed with depression and currently taking Venlafaxine 225 mg daily. Takes her medication as prescribed and denies adverse side effects. Believes the medication may not be as effective. She is the sole caregiver for her husband who has experienced a stroke and also required an LVAD and describes that she has little energy remaining. Describes no incentive to do anything. Has the occasional suicidal ideation, however not currently. Does have a good support system around her and does enjoy her grandchildren. She is currently working with a Child psychotherapist through the [facility] program and may be able to get some relief as they are looking into programs such as the ACEs for her husband to partake in.   Allergies  Allergen Reactions  . Cozaar [Losartan] Other (See Comments)    Cause pt to lose her taste for foods  . Codeine Itching and Other (See Comments)    Makes feel crazy  PT STATES SHE ALSO CAN NOT TAKE THE SYNTHETIC CODEINES  . Hydrocodone Itching    Tolerates with benadryl  . Oxycodone Itching  . Tetanus Toxoids Swelling and Rash     Outpatient Prescriptions Prior to Visit  Medication Sig Dispense Refill  . amLODipine (NORVASC) 2.5 MG tablet Take 1 tablet (2.5 mg total) by mouth daily. 90 tablet 3  . aspirin EC 81 MG tablet Take 81 mg by mouth daily.    . Cyanocobalamin 1000 MCG/ML KIT Inject 1,000 mcg as directed every 30 (thirty) days. B 12 shot    . furosemide (LASIX) 20 MG tablet Take 0.5 tablets (10 mg total) by mouth daily as  needed for fluid or edema. 15 tablet 5  . hydrochlorothiazide (HYDRODIURIL) 25 MG tablet Take 25 mg by mouth daily with breakfast.     . Melatonin 5 MG TABS Take 2.5 mg by mouth at bedtime.    . Multiple Vitamin (MULTIVITAMIN WITH MINERALS) TABS tablet Take 1 tablet by mouth daily.    . pantoprazole (PROTONIX) 40 MG tablet Take 40 mg by mouth every morning.    . Venlafaxine HCl 225 MG TB24 Take 225 mg by mouth daily with breakfast.     No facility-administered medications prior to visit.     Past Medical History  Diagnosis Date  . GERD (gastroesophageal reflux disease)   . Depression   . Hypertension   . Shortness of breath dyspnea     with exertion  . Arthritis      Past Surgical History  Procedure Laterality Date  . Bone spur      R thigh  . Total shoulder arthroplasty  09/13/2011    Procedure: TOTAL SHOULDER ARTHROPLASTY;  Surgeon: Verlee Rossetti, MD;  Location: [facility] OR;  Service: Orthopedics;  Laterality: Left;  LEFT SHOULDER REVERSED TOTAL SHOULDER ARTHROPLASTY  . Total knee arthroplasty  04/27/2012    Procedure: TOTAL KNEE ARTHROPLASTY;  Surgeon: Shelda Pal, MD;  Location: [facility] ORS;  Service: Orthopedics;  Laterality: Right;  . Eye surgery      bilateral cataract with  lens implants  . Tonsillectomy      age 64  . Esophagogastroduodenoscopy (egd) with propofol N/A 11/08/2014    Procedure: ESOPHAGOGASTRODUODENOSCOPY (EGD) WITH PROPOFOL;  Surgeon: Garlan Fair, MD;  Location: [facility] END[facility];  Service: Endoscopy;  Laterality: N/A;     Family History  Problem Relation Age of Onset  . Heart attack Mother 53    Died age 60  . Heart disease Brother     Atrial fib     Social History   Social History  . Marital Status: Married    Spouse Name: N/A  . Number of Children: 3  . Years of Education: 16   Occupational History  . Not on file.   Social History Main Topics  . Smoking status: Never Smoker   . Smokeless tobacco: Never Used  . Alcohol Use: No  . Drug  Use: No  . Sexual Activity: Not on file   Other Topics Concern  . Not on file   Social History Narrative   Retired Pharmacist, hospital   Denies abuse and feels safe at home      Review of Systems  Constitutional: Positive for fatigue. Negative for fever and chills.  Psychiatric/Behavioral: Positive for sleep disturbance and dysphoric mood. Negative for suicidal ideas.      Objective:    BP 152/70 mmHg  Pulse 68  Temp(Src) 98.2 F (36.8 C) (Oral)  Ht $R'5\' 8"'pe$  (1.727 m)  Wt 159 lb 4 oz (72.235 kg)  BMI 24.22 kg/m2 Nursing note and vital signs reviewed.  Depression screen PHQ 2/9 04/20/2015  Decreased Interest 3  Down, Depressed, Hopeless 3  PHQ - 2 Score 6  Altered sleeping 3  Tired, decreased energy 3  Change in appetite 0  Feeling bad or failure about yourself  3  Trouble concentrating 0  Moving slowly or fidgety/restless 0  Suicidal thoughts 1  PHQ-9 Score 16  Difficult doing work/chores Very difficult    Physical Exam  Constitutional: She is oriented to person, place, and time. She appears well-developed and well-nourished. No distress.  Cardiovascular: Normal rate, regular rhythm, normal heart sounds and intact distal pulses.   Pulmonary/Chest: Effort normal and breath sounds normal.  Neurological: She is alert and oriented to person, place, and time.  Skin: Skin is warm and dry.  Psychiatric: Her behavior is normal. Judgment and thought content normal. She exhibits a depressed mood.       Assessment & Plan:   Problem List Items Addressed This Visit      Other   Depression - Primary    Exacerbated symptoms of depression likely related to caregiver stress. Denies thoughts of sucide. Decrease Venlafaxine to 150 mg daily. Continue to work with the Education officer, museum through the [place] program to find support. Discussed importance of using available resources and coping mechanisms to be able to help herself. Follow up in 1 month to determine effectiveness of medication reduction  or sooner if needed.       Relevant Medications   Venlafaxine HCl 150 MG TB24

## 2015-05-01 ENCOUNTER — Telehealth: Payer: Self-pay | Admitting: Family

## 2015-05-01 ENCOUNTER — Ambulatory Visit (INDEPENDENT_AMBULATORY_CARE_PROVIDER_SITE_OTHER): Payer: Medicare Other | Admitting: Family Medicine

## 2015-05-01 ENCOUNTER — Encounter: Payer: Self-pay | Admitting: Family Medicine

## 2015-05-01 VITALS — BP 112/60 | HR 99 | Ht 68.0 in | Wt 161.0 lb

## 2015-05-01 DIAGNOSIS — M47817 Spondylosis without myelopathy or radiculopathy, lumbosacral region: Secondary | ICD-10-CM | POA: Insufficient documentation

## 2015-05-01 DIAGNOSIS — M489 Spondylopathy, unspecified: Secondary | ICD-10-CM | POA: Diagnosis not present

## 2015-05-01 NOTE — Telephone Encounter (Signed)
Ok with me 

## 2015-05-01 NOTE — Progress Notes (Signed)
[name] [address] [place], [place] [address] Phone: [phone] Subjective:    I'm seeing this patient by the request  of:    CC: hip and shoulder pain  [name]:[name] is a 79 y.o. female coming in with complaint of hip and shoulder pain. Patient is status post left-sided shoulder replacement. Patient is also had a significant back workup previously images reviewed by me shows the patient does have severe osteophytic changes of multiple joints and degenerative disc disease and facet arthritis of the lumbar spine as well as severe sclerosis of the sacroiliac joints bilaterally. Patient states she is having mostly severe back pain. States that the shoulder pain seems to be improving slowly. Patient states that trying to extend her back is significant. Patient has to stay home [place] all the time. Does not remember any true injury. No significant radiation down the legs or any numbness. Denies any significant weakness but feels fatigued. Having pain almost all the time. States that with certain movements that can have a severe sharp pain. Remaining active. Looking for other modalities at this time. Once to avoid taking chronic medications if possible. Denies any nighttime awakening. Denies any fevers, chills, or any abnormal weight loss.     Past Medical History  Diagnosis Date  . GERD (gastroesophageal reflux disease)   . Depression   . Hypertension   . Shortness of breath dyspnea     with exertion  . Arthritis    Past Surgical History  Procedure Laterality Date  . Bone spur      R thigh  . Total shoulder arthroplasty  09/13/2011    Procedure: TOTAL SHOULDER ARTHROPLASTY;  Surgeon: Augustin Schooling, MD;  Location: [facility];  Service: Orthopedics;  Laterality: Left;  LEFT SHOULDER REVERSED TOTAL SHOULDER ARTHROPLASTY  . Total knee arthroplasty  04/27/2012    Procedure: TOTAL KNEE ARTHROPLASTY;  Surgeon: Mauri Pole, MD;  Location: [facility];   Service: Orthopedics;  Laterality: Right;  . Eye surgery      bilateral cataract with lens implants  . Tonsillectomy      age 82  . Esophagogastroduodenoscopy (egd) with propofol N/A 11/08/2014    Procedure: ESOPHAGOGASTRODUODENOSCOPY (EGD) WITH PROPOFOL;  Surgeon: Garlan Fair, MD;  Location: [facility];  Service: Endoscopy;  Laterality: N/A;   Social History  Substance Use Topics  . Smoking status: Never Smoker   . Smokeless tobacco: Never Used  . Alcohol Use: No   Allergies  Allergen Reactions  . Cozaar [Losartan] Other (See Comments)    Cause pt to lose her taste for foods  . Codeine Itching and Other (See Comments)    Makes feel crazy  PT STATES SHE ALSO CAN NOT TAKE THE SYNTHETIC CODEINES  . Hydrocodone Itching    Tolerates with benadryl  . Oxycodone Itching  . Tetanus Toxoids Swelling and Rash   Family History  Problem Relation Age of Onset  . Heart attack Mother 37    Died age 60  . Heart disease Brother     Atrial fib    Past medical history, social, surgical and family history all reviewed in electronic medical record.   Review of Systems: No headache, visual changes, nausea, vomiting, diarrhea, constipation, dizziness, abdominal pain, skin rash, fevers, chills, night sweats, weight loss, swollen lymph nodes, body aches, joint swelling, muscle aches, chest pain, shortness of breath, mood changes.   Objective Blood pressure 112/60, pulse 99, height 5\' 8"  (1.727 m),  weight 161 lb (73.029 kg), SpO2 97 %.  General: No apparent distress alert and oriented x3 mood and affect normal, dressed appropriately.  HEENT: Pupils equal, extraocular movements intact  Respiratory: Patient's speak in full sentences and does not appear short of breath  Cardiovascular: No lower extremity edema, non tender, no erythema  Skin: Warm dry intact with no signs of infection or rash on extremities or on axial skeleton.  Abdomen: Soft nontender  Neuro: Cranial nerves II through XII are  intact, neurovascularly intact in all extremities with 2+ DTRs and 2+ pulses.  Lymph: No lymphadenopathy of posterior or anterior cervical chain or axillae bilaterally.  Gait walks in a flexed position with a cane  MSK:  Non tender with full range of motion and good stability and symmetric strength and tone of shoulders, elbows, wrist, hip, knee and ankles bilaterally. Significant arthritic changes of multiple joints. Back Exam:  Inspection: significant kyphosis Motion: Flexion 45 deg, Extension 9 deg unable to extend avoid secondary to significant pain of the lower extremities mostly the sacroiliac joint, Side Bending to 5 deg bilaterally,  Rotation to 15 deg bilaterally  SLR laying: Negative  XSLR laying: Negative  Palpable tenderness: severe tenderness over the sacroiliac joints bilaterally and mildly lumbar and sacral area of the paraspinal musculature. FABER: negative. Sensory change: Gross sensation intact to all lumbar and sacral dermatomes.  Reflexes: 2+ at both patellar tendons, 2+ at achilles tendons, Babinski's downgoing.  Strength at foot and legs are symmetric.  Procedure note 82505; 15 minutes spent for Therapeutic exercises as stated in above notes.  This included exercises focusing on stretching, strengthening, with significant focus on eccentric aspects.  Sacroiliac Joint Mobilization and Rehab 1. Work on pretzel stretching, shoulder back and leg draped in front. 3-5 sets, 30 sec.. 2. hip abductor rotations. standing, hip flexion and rotation outward then inward. 3 sets, 15 reps. when can do comfortably, add ankle weights starting at 2 pounds.  3. cross over stretching - shoulder back to ground, same side leg crossover. 3-5 sets for 30 min..  4. rolling up and back knees to chest and rocking. 5. sacral tilt - 5 sets, hold for 5-10 seconds  Proper technique shown and discussed handout in great detail with ATC.  All questions were discussed and answered.     Impression and  Recommendations:     This case required medical decision making of moderate complexity.

## 2015-05-01 NOTE — Telephone Encounter (Signed)
ok 

## 2015-05-01 NOTE — Progress Notes (Signed)
Pre visit review using our clinic review tool, if applicable. No additional management support is needed unless otherwise documented below in the visit note. 

## 2015-05-01 NOTE — Patient Instructions (Addendum)
Good to see you.  Ice 20 minutes 2 times daily. Usually after activity and before bed. Exercises 3 times a week.  Take tylenol 325 mg three times a day is the best evidence based medicine we have for arthritis.  Vitamin D 2000 IU daily Fish oil 2 grams daily.  Tumeric 500mg  daily.  pennsaid pinkie amount topically 2 times daily as needed.   Trying to stand straight up on wall flat back when you can.  Water aerobics and cycling with low resistance are the best two types of exercise for arthritis. Come back and see me in 3-4 weeks.

## 2015-05-01 NOTE — Assessment & Plan Note (Signed)
Patient does have severe arthritis overall. We discussed icing regimen, home exercises, which activities to do an which was potentially avoid. Patient work with Product/process development scientist today. Patient given topical anti-inflammatories and we discussed proper dosing of the vitamin D supplementation. We discussed icing regimen. We discussed proper shoes and postural control exercises. Patient wanted to attempt osteopathic manipulation but I do not feel that patient is a candidate. We discussed changing patient's hypertensive medications as well. Patient will come back and see me again in 3-4 weeks for further evaluation Patient could be a candidate for sacroiliac injections ultrasound guided

## 2015-05-01 NOTE — Telephone Encounter (Signed)
Patient is requesting to transfer from [facility] to Dr. Quay Burow.  Please advise.

## 2015-05-04 ENCOUNTER — Ambulatory Visit (HOSPITAL_COMMUNITY)
Admission: RE | Admit: 2015-05-04 | Discharge: 2015-05-04 | Disposition: A | Discharge status: Home / Self Care | Payer: Medicare Other | Source: Ambulatory Visit | Attending: Cardiology | Admitting: Cardiology

## 2015-05-04 DIAGNOSIS — R0989 Other specified symptoms and signs involving the circulatory and respiratory systems: Secondary | ICD-10-CM | POA: Diagnosis present

## 2015-05-04 DIAGNOSIS — I10 Essential (primary) hypertension: Secondary | ICD-10-CM | POA: Insufficient documentation

## 2015-05-04 DIAGNOSIS — I6523 Occlusion and stenosis of bilateral carotid arteries: Secondary | ICD-10-CM | POA: Insufficient documentation

## 2015-05-16 ENCOUNTER — Ambulatory Visit: Payer: Medicare Other | Admitting: Podiatry

## 2015-05-17 ENCOUNTER — Encounter: Payer: Self-pay | Admitting: *Deleted

## 2015-05-22 ENCOUNTER — Other Ambulatory Visit (INDEPENDENT_AMBULATORY_CARE_PROVIDER_SITE_OTHER): Payer: Medicare Other

## 2015-05-22 ENCOUNTER — Telehealth: Payer: Self-pay | Admitting: Family

## 2015-05-22 ENCOUNTER — Ambulatory Visit: Payer: Medicare Other | Admitting: Family Medicine

## 2015-05-22 ENCOUNTER — Telehealth: Payer: Self-pay

## 2015-05-22 ENCOUNTER — Ambulatory Visit (INDEPENDENT_AMBULATORY_CARE_PROVIDER_SITE_OTHER): Payer: Medicare Other | Admitting: Family

## 2015-05-22 ENCOUNTER — Encounter: Payer: Self-pay | Admitting: Family

## 2015-05-22 VITALS — BP 132/68 | HR 81 | Temp 97.8°F | Resp 18 | Ht 68.0 in | Wt 162.4 lb

## 2015-05-22 DIAGNOSIS — F329 Major depressive disorder, single episode, unspecified: Secondary | ICD-10-CM

## 2015-05-22 DIAGNOSIS — R0600 Dyspnea, unspecified: Secondary | ICD-10-CM | POA: Diagnosis not present

## 2015-05-22 DIAGNOSIS — F32A Depression, unspecified: Secondary | ICD-10-CM

## 2015-05-22 LAB — BRAIN NATRIURETIC PEPTIDE: Pro B Natriuretic peptide (BNP): 341 pg/mL — ABNORMAL HIGH (ref 0.0–100.0)

## 2015-05-22 LAB — CBC
HCT: 17.8 % — CL (ref 36.0–46.0)
MCHC: 33.3 g/dL (ref 30.0–36.0)
MCV: 97.5 fl (ref 78.0–100.0)
RBC: 1.83 Mil/uL — AB (ref 3.87–5.11)
RDW: 28 % — ABNORMAL HIGH (ref 11.5–15.5)
WBC: 1.8 10*3/uL — CL (ref 4.0–10.5)

## 2015-05-22 LAB — IBC PANEL
Iron: 191 ug/dL — ABNORMAL HIGH (ref 42–145)
SATURATION RATIOS: 56.8 % — AB (ref 20.0–50.0)
TRANSFERRIN: 240 mg/dL (ref 212.0–360.0)

## 2015-05-22 NOTE — Telephone Encounter (Signed)
CRITICAL LAB PER KAREN EXT 806 WHITE COUNT 1.8 PLATELET COUNT 14 HEMOGLOBIN 5.0 HEMATOCRIT 17.8.  REREAD BACK WILL ROUTE TO PCP AND INFORM

## 2015-05-22 NOTE — Assessment & Plan Note (Signed)
Depression is stable with decreased dosage and no exacerbation of symptoms. Continue current dosage of venlafaxine. Denies suicidal ideation. Follow up in 1 month.

## 2015-05-22 NOTE — Progress Notes (Signed)
Pre visit review using our clinic review tool, if applicable. No additional management support is needed unless otherwise documented below in the visit note. 

## 2015-05-22 NOTE — Telephone Encounter (Signed)
Please inform patient that the blood work that she completed is concerningly low in several areas and would like her to be evaluated in the Emergency Department for further workup. There is concern that her bone marrow which makes blood cells is not doing a very good job of supplying her body with necessary cells. Further work up is needed to determine exact causes.

## 2015-05-22 NOTE — Assessment & Plan Note (Signed)
Dyspnea of questionable origin. Obtain BNP, CBC, IBC panel, Ferritin, and D-dimer. Previous 2-D echocardiogram revealed grade 1 diastolic dysfunction. Encouraged to try previously prescribed furosemide. Follow up pending lab work or sooner if symptoms worsen or fail to improve.

## 2015-05-22 NOTE — Progress Notes (Signed)
Subjective:    Patient ID: [name], female    DOB: Nov 02, 1935, 79 y.o.   MRN: [id number]  Chief Complaint  Patient presents with  . Follow-up    Depression     HPI:  [name] is a 79 y.o. female who  has a past medical history of GERD (gastroesophageal reflux disease); Depression; Hypertension; Shortness of breath dyspnea; and Arthritis. and presents today for a follow up office visit.  1.) Depression - Recently decreased Venlafaxine to 150 mg daily in concern for the medication not working as well. At the time had no energy and decreased incentive/drive to do things. Her husband does have a VAD and she is the sole caregiver. Since decreasing the medication she has not had any additional increases in her depression symptoms that she noticed. Denies suicidal ideations.   2.) Dyspnea - Associated symptom of dyspnea has been worsening within the past few weeks. Describes that she becomes excessively winded after walking short distances. Previous 2D echo with Grade 1 diastolic dysfunction  Also previously prescribed furosemide by cardiology which she has yet to start taking. Lower extremity edema is present in the evenings. Denis chest pain, palpitations, calf pain or radiating pain. The only modifying factor that helps is rest.    Allergies  Allergen Reactions  . Cozaar [Losartan] Other (See Comments)    Cause pt to lose her taste for foods  . Codeine Itching and Other (See Comments)    Makes feel crazy  PT STATES SHE ALSO CAN NOT TAKE THE SYNTHETIC CODEINES  . Hydrocodone Itching    Tolerates with benadryl  . Oxycodone Itching  . Tetanus Toxoids Swelling and Rash     Current Outpatient Prescriptions on File Prior to Visit  Medication Sig Dispense Refill  . amLODipine (NORVASC) 2.5 MG tablet Take 1 tablet (2.5 mg total) by mouth daily. 90 tablet 3  . aspirin EC 81 MG tablet Take 81 mg by mouth daily.    . Cyanocobalamin 1000 MCG/ML KIT Inject 1,000 mcg as  directed every 30 (thirty) days. B 12 shot    . furosemide (LASIX) 20 MG tablet Take 0.5 tablets (10 mg total) by mouth daily as needed for fluid or edema. 15 tablet 5  . Melatonin 5 MG TABS Take 2.5 mg by mouth at bedtime.    . Multiple Vitamin (MULTIVITAMIN WITH MINERALS) TABS tablet Take 1 tablet by mouth daily.    . pantoprazole (PROTONIX) 40 MG tablet Take 40 mg by mouth every morning.    . Venlafaxine HCl 150 MG TB24 Take 1 tablet (150 mg total) by mouth daily. 30 each 0  . Vitamin D, Ergocalciferol, (DRISDOL) 50000 UNITS CAPS capsule      No current facility-administered medications on file prior to visit.    Past Medical History  Diagnosis Date  . GERD (gastroesophageal reflux disease)   . Depression   . Hypertension   . Shortness of breath dyspnea     with exertion  . Arthritis     Review of Systems  Constitutional: Negative for fever and chills.  Respiratory: Positive for shortness of breath. Negative for chest tightness.   Cardiovascular: Negative for chest pain, palpitations and leg swelling.  Psychiatric/Behavioral: Positive for dysphoric mood. Negative for suicidal ideas and sleep disturbance.      Objective:    BP 132/68 mmHg  Pulse 81  Temp(Src) 97.8 F (36.6 C) (Oral)  Resp 18  Ht _0  (1.727 m)  Wt 162 lb  6.4 oz (73.664 kg)  BMI 24.70 kg/m2  SpO2 99% Nursing note and vital signs reviewed.  Physical Exam  Constitutional: She is oriented to person, place, and time. She appears well-developed and well-nourished. No distress.  Cardiovascular: Normal rate, regular rhythm, normal heart sounds and intact distal pulses.   Pulmonary/Chest: Effort normal and breath sounds normal.  Neurological: She is alert and oriented to person, place, and time.  Skin: Skin is warm and dry.  Psychiatric: She has a normal mood and affect. Her behavior is normal. Judgment and thought content normal.       Assessment & Plan:   Problem List Items Addressed This Visit       Other   Dyspnea    Dyspnea of questionable origin. Obtain BNP, CBC, IBC panel, Ferritin, and D-dimer. Previous 2-D echocardiogram revealed grade 1 diastolic dysfunction. Encouraged to try previously prescribed furosemide. Follow up pending lab work or sooner if symptoms worsen or fail to improve.       Relevant Orders   B Nat Peptide (Completed)   CBC (Completed)   IBC panel (Completed)   Ferritin   D-Dimer, Quantitative   Depression - Primary    Depression is stable with decreased dosage and no exacerbation of symptoms. Continue current dosage of venlafaxine. Denies suicidal ideation. Follow up in 1 month.

## 2015-05-22 NOTE — Telephone Encounter (Signed)
Pt aware of results 

## 2015-05-22 NOTE — Telephone Encounter (Signed)
Critical labs received indicating a pancytopenia. Instructed patient to follow up with ED for further assessment and management.

## 2015-05-22 NOTE — Patient Instructions (Addendum)
Thank you for choosing [facility].  Summary/Instructions:  Please stop by the lab.  Please start taking the Lasix as needed for shortness of breath.  Keep your legs elevated and watch the intake of sodium in your diet.  If your symptoms worsen or fail to improve, please contact our office for further instruction, or in case of emergency go directly to the emergency room at the closest medical facility.    Shortness of Breath Shortness of breath means you have trouble breathing. It could also mean that you have a medical problem. You should get immediate medical care for shortness of breath. CAUSES   Not enough oxygen in the air such as with high altitudes or a smoke-filled room.  Certain lung diseases, infections, or problems.  Heart disease or conditions, such as angina or heart failure.  Low red blood cells (anemia).  Poor physical fitness, which can cause shortness of breath when you exercise.  Chest or back injuries or stiffness.  Being overweight.  Smoking.  Anxiety, which can make you feel like you are not getting enough air. DIAGNOSIS  Serious medical problems can often be found during your physical exam. Tests may also be done to determine why you are having shortness of breath. Tests may include:  Chest X-rays.  Lung function tests.  Blood tests.  An electrocardiogram (ECG).  An ambulatory electrocardiogram. An ambulatory ECG records your heartbeat patterns over a 24-hour period.  Exercise testing.  A transthoracic echocardiogram (TTE). During echocardiography, sound waves are used to evaluate how blood flows through your heart.  A transesophageal echocardiogram (TEE).  Imaging scans. Your health care provider may not be able to find a cause for your shortness of breath after your exam. In this case, it is important to have a follow-up exam with your health care provider as directed.  TREATMENT  Treatment for shortness of breath depends on the  cause of your symptoms and can vary greatly. HOME CARE INSTRUCTIONS   Do not smoke. Smoking is a common cause of shortness of breath. If you smoke, ask for help to quit.  Avoid being around chemicals or things that may bother your breathing, such as paint fumes and dust.  Rest as needed. Slowly resume your usual activities.  If medicines were prescribed, take them as directed for the full length of time directed. This includes oxygen and any inhaled medicines.  Keep all follow-up appointments as directed by your health care provider. SEEK MEDICAL CARE IF:   Your condition does not improve in the time expected.  You have a hard time doing your normal activities even with rest.  You have any new symptoms. SEEK IMMEDIATE MEDICAL CARE IF:   Your shortness of breath gets worse.  You feel light-headed, faint, or develop a cough not controlled with medicines.  You start coughing up blood.  You have pain with breathing.  You have chest pain or pain in your arms, shoulders, or abdomen.  You have a fever.  You are unable to walk up stairs or exercise the way you normally do. MAKE SURE YOU:  Understand these instructions.  Will watch your condition.  Will get help right away if you are not doing well or get worse.   This information is not intended to replace advice given to you by your health care provider. Make sure you discuss any questions you have with your health care provider.   Document Released: 03/05/2001 Document Revised: 06/15/2013 Document Reviewed: 08/26/2011 Elsevier Interactive Patient Education 2016  [employer].

## 2015-05-23 ENCOUNTER — Encounter (HOSPITAL_COMMUNITY): Payer: Self-pay | Admitting: Emergency Medicine

## 2015-05-23 ENCOUNTER — Inpatient Hospital Stay (HOSPITAL_COMMUNITY)
Admission: EM | Admit: 2015-05-23 | Discharge: 2015-05-26 | DRG: 812 | Disposition: A | Discharge status: Home / Self Care | Payer: Medicare Other | Attending: Internal Medicine | Admitting: Internal Medicine

## 2015-05-23 DIAGNOSIS — E538 Deficiency of other specified B group vitamins: Secondary | ICD-10-CM | POA: Diagnosis present

## 2015-05-23 DIAGNOSIS — I10 Essential (primary) hypertension: Secondary | ICD-10-CM | POA: Diagnosis present

## 2015-05-23 DIAGNOSIS — F329 Major depressive disorder, single episode, unspecified: Secondary | ICD-10-CM | POA: Diagnosis present

## 2015-05-23 DIAGNOSIS — D61818 Other pancytopenia: Secondary | ICD-10-CM | POA: Diagnosis present

## 2015-05-23 DIAGNOSIS — G629 Polyneuropathy, unspecified: Secondary | ICD-10-CM | POA: Diagnosis present

## 2015-05-23 DIAGNOSIS — Z96612 Presence of left artificial shoulder joint: Secondary | ICD-10-CM | POA: Diagnosis present

## 2015-05-23 DIAGNOSIS — E876 Hypokalemia: Secondary | ICD-10-CM | POA: Diagnosis not present

## 2015-05-23 DIAGNOSIS — D469 Myelodysplastic syndrome, unspecified: Secondary | ICD-10-CM | POA: Diagnosis present

## 2015-05-23 DIAGNOSIS — F32A Depression, unspecified: Secondary | ICD-10-CM | POA: Diagnosis present

## 2015-05-23 DIAGNOSIS — I272 Other secondary pulmonary hypertension: Secondary | ICD-10-CM | POA: Diagnosis present

## 2015-05-23 DIAGNOSIS — Z7982 Long term (current) use of aspirin: Secondary | ICD-10-CM | POA: Diagnosis not present

## 2015-05-23 DIAGNOSIS — R0602 Shortness of breath: Secondary | ICD-10-CM | POA: Diagnosis present

## 2015-05-23 DIAGNOSIS — D696 Thrombocytopenia, unspecified: Secondary | ICD-10-CM | POA: Diagnosis not present

## 2015-05-23 DIAGNOSIS — Z96651 Presence of right artificial knee joint: Secondary | ICD-10-CM | POA: Diagnosis present

## 2015-05-23 DIAGNOSIS — M1990 Unspecified osteoarthritis, unspecified site: Secondary | ICD-10-CM | POA: Diagnosis present

## 2015-05-23 DIAGNOSIS — K5909 Other constipation: Secondary | ICD-10-CM | POA: Diagnosis present

## 2015-05-23 DIAGNOSIS — K219 Gastro-esophageal reflux disease without esophagitis: Secondary | ICD-10-CM | POA: Diagnosis present

## 2015-05-23 DIAGNOSIS — N179 Acute kidney failure, unspecified: Secondary | ICD-10-CM | POA: Diagnosis present

## 2015-05-23 DIAGNOSIS — K5900 Constipation, unspecified: Secondary | ICD-10-CM | POA: Diagnosis not present

## 2015-05-23 DIAGNOSIS — R0600 Dyspnea, unspecified: Secondary | ICD-10-CM | POA: Diagnosis not present

## 2015-05-23 DIAGNOSIS — Z9289 Personal history of other medical treatment: Secondary | ICD-10-CM

## 2015-05-23 DIAGNOSIS — D649 Anemia, unspecified: Secondary | ICD-10-CM | POA: Diagnosis not present

## 2015-05-23 HISTORY — DX: Low back pain, unspecified: M54.50

## 2015-05-23 HISTORY — DX: Other chronic pain: G89.29

## 2015-05-23 HISTORY — DX: Personal history of other medical treatment: Z92.89

## 2015-05-23 HISTORY — DX: Anxiety disorder, unspecified: F41.9

## 2015-05-23 HISTORY — DX: Anemia, unspecified: D64.9

## 2015-05-23 HISTORY — DX: Low back pain: M54.5

## 2015-05-23 LAB — COMPREHENSIVE METABOLIC PANEL
ALT: 11 U/L — AB (ref 14–54)
AST: 16 U/L (ref 15–41)
Albumin: 3.5 g/dL (ref 3.5–5.0)
Alkaline Phosphatase: 54 U/L (ref 38–126)
Anion gap: 7 (ref 5–15)
BILIRUBIN TOTAL: 0.7 mg/dL (ref 0.3–1.2)
BUN: 17 mg/dL (ref 6–20)
CO2: 26 mmol/L (ref 22–32)
CREATININE: 1.04 mg/dL — AB (ref 0.44–1.00)
Calcium: 8.9 mg/dL (ref 8.9–10.3)
Chloride: 104 mmol/L (ref 101–111)
GFR, EST AFRICAN AMERICAN: 59 mL/min — AB (ref >60)
GFR, EST NON AFRICAN AMERICAN: 50 mL/min — AB (ref >60)
Glucose, Bld: 99 mg/dL (ref 65–99)
Potassium: 3.6 mmol/L (ref 3.5–5.1)
Sodium: 137 mmol/L (ref 135–145)
TOTAL PROTEIN: 6.3 g/dL — AB (ref 6.5–8.1)

## 2015-05-23 LAB — RETICULOCYTES
RBC.: 2.31 MIL/uL — AB (ref 3.87–5.11)
RETIC COUNT ABSOLUTE: 39.3 10*3/uL (ref 19.0–186.0)
RETIC CT PCT: 1.7 % (ref 0.4–3.1)

## 2015-05-23 LAB — CBC
HCT: 16.7 % — ABNORMAL LOW (ref 36.0–46.0)
HEMATOCRIT: 21 % — AB (ref 36.0–46.0)
HEMOGLOBIN: 6.8 g/dL — AB (ref 12.0–15.0)
Hemoglobin: 5.4 g/dL — CL (ref 12.0–15.0)
MCH: 32.5 pg (ref 26.0–34.0)
MCH: 30.4 pg (ref 26.0–34.0)
MCHC: 32.3 g/dL (ref 30.0–36.0)
MCHC: 32.4 g/dL (ref 30.0–36.0)
MCV: 100.6 fL — ABNORMAL HIGH (ref 78.0–100.0)
MCV: 93.8 fL (ref 78.0–100.0)
PLATELETS: 10 10*3/uL — AB (ref 150–400)
Platelets: 9 10*3/uL — CL (ref 150–400)
RBC: 1.66 MIL/uL — AB (ref 3.87–5.11)
RBC: 2.24 MIL/uL — AB (ref 3.87–5.11)
RDW: 25.1 % — AB (ref 11.5–15.5)
RDW: 26.4 % — ABNORMAL HIGH (ref 11.5–15.5)
WBC: 1.7 10*3/uL — AB (ref 4.0–10.5)
WBC: 2.1 10*3/uL — ABNORMAL LOW (ref 4.0–10.5)

## 2015-05-23 LAB — DIC (DISSEMINATED INTRAVASCULAR COAGULATION)PANEL
D-Dimer, Quant: 1.45 ug/mL-FEU — ABNORMAL HIGH (ref 0.00–0.50)
Fibrinogen: 316 mg/dL (ref 204–475)
INR: 1.33 (ref 0.00–1.49)
Prothrombin Time: 16.6 seconds — ABNORMAL HIGH (ref 11.6–15.2)
aPTT: 33 seconds (ref 24–37)

## 2015-05-23 LAB — TSH: TSH: 5.719 u[IU]/mL — ABNORMAL HIGH (ref 0.350–4.500)

## 2015-05-23 LAB — LACTATE DEHYDROGENASE: LDH: 188 U/L (ref 98–192)

## 2015-05-23 LAB — DIFFERENTIAL
BASOS ABS: 0.1 10*3/uL (ref 0.0–0.1)
BASOS PCT: 2 %
EOS PCT: 2 %
Eosinophils Absolute: 0.1 10*3/uL (ref 0.0–0.7)
Lymphocytes Relative: 46 %
Lymphs Abs: 0.9 10*3/uL (ref 0.7–4.0)
MONOS PCT: 16 %
Monocytes Absolute: 0.3 10*3/uL (ref 0.1–1.0)
NEUTROS PCT: 34 %
Neutro Abs: 0.7 10*3/uL — ABNORMAL LOW (ref 1.7–7.7)

## 2015-05-23 LAB — FOLATE: Folate: 25 ng/mL (ref >5.9)

## 2015-05-23 LAB — D-DIMER, QUANTITATIVE (NOT AT ARMC): D DIMER QUANT: 1.04 ug{FEU}/mL — AB (ref 0.00–0.48)

## 2015-05-23 LAB — DIRECT ANTIGLOBULIN TEST (NOT AT ARMC)
DAT, IgG: NEGATIVE
DAT, complement: NEGATIVE

## 2015-05-23 LAB — VITAMIN B12: VITAMIN B 12: 309 pg/mL (ref 180–914)

## 2015-05-23 LAB — DIC (DISSEMINATED INTRAVASCULAR COAGULATION) PANEL: PLATELETS: 6 10*3/uL — AB (ref 150–400)

## 2015-05-23 LAB — PREPARE RBC (CROSSMATCH)

## 2015-05-23 MED ORDER — ONDANSETRON HCL 4 MG/2ML IJ SOLN: 4.0000 mg | Freq: Four times a day (QID) | INTRAMUSCULAR | Status: DC | PRN

## 2015-05-23 MED ORDER — VENLAFAXINE HCL ER 150 MG PO TB24
150.0000 mg | ORAL_TABLET | Freq: Every day | ORAL | Status: DC
  Administered 2015-05-23: 150 mg via ORAL

## 2015-05-23 MED ORDER — ONDANSETRON HCL 4 MG PO TABS
4.0000 mg | ORAL_TABLET | Freq: Four times a day (QID) | ORAL | Status: DC | PRN
Start: 2015-05-23 — End: 2015-05-26

## 2015-05-23 MED ORDER — SODIUM CHLORIDE 0.9 % IV SOLN: 10.0000 mL/h | Freq: Once | INTRAVENOUS | Status: DC

## 2015-05-23 MED ORDER — ACETAMINOPHEN 325 MG PO TABS: 650.0000 mg | ORAL_TABLET | Freq: Four times a day (QID) | ORAL | Status: DC | PRN

## 2015-05-23 MED ORDER — ALUM & MAG HYDROXIDE-SIMETH 200-200-20 MG/5ML PO SUSP: 30.0000 mL | Freq: Four times a day (QID) | ORAL | Status: DC | PRN

## 2015-05-23 MED ORDER — ACETAMINOPHEN 650 MG RE SUPP: 650.0000 mg | Freq: Four times a day (QID) | RECTAL | Status: DC | PRN

## 2015-05-23 MED ORDER — FUROSEMIDE 10 MG/ML IJ SOLN
20.0000 mg | Freq: Once | INTRAMUSCULAR | Status: AC
  Administered 2015-05-23: 20 mg via INTRAVENOUS
  Filled 2015-05-23: qty 2

## 2015-05-23 MED ORDER — VENLAFAXINE HCL ER 150 MG PO CP24
150.0000 mg | ORAL_CAPSULE | Freq: Every day | ORAL | Status: DC
  Administered 2015-05-23 – 2015-05-26 (×5): 150 mg via ORAL
  Filled 2015-05-23 (×4): qty 1

## 2015-05-23 MED ORDER — SODIUM CHLORIDE 0.9 % IJ SOLN
3.0000 mL | Freq: Two times a day (BID) | INTRAMUSCULAR | Status: DC
  Administered 2015-05-23 – 2015-05-26 (×7): 3 mL via INTRAVENOUS

## 2015-05-23 MED ORDER — ADULT MULTIVITAMIN W/MINERALS CH
1.0000 | ORAL_TABLET | Freq: Every day | ORAL | Status: DC
  Administered 2015-05-23 – 2015-05-26 (×4): 1 via ORAL
  Filled 2015-05-23 (×4): qty 1

## 2015-05-23 MED ORDER — SODIUM CHLORIDE 0.9 % IV SOLN: Freq: Once | INTRAVENOUS | Status: DC

## 2015-05-23 MED ORDER — PANTOPRAZOLE SODIUM 40 MG PO TBEC
40.0000 mg | DELAYED_RELEASE_TABLET | Freq: Every day | ORAL | Status: DC
  Administered 2015-05-24 – 2015-05-26 (×3): 40 mg via ORAL
  Filled 2015-05-23 (×3): qty 1

## 2015-05-23 MED ORDER — AMLODIPINE BESYLATE 5 MG PO TABS
2.5000 mg | ORAL_TABLET | Freq: Every day | ORAL | Status: DC
  Administered 2015-05-24 – 2015-05-26 (×3): 2.5 mg via ORAL
  Filled 2015-05-23 (×3): qty 1

## 2015-05-23 NOTE — Consult Note (Signed)
[name]  [phone] [phone]     ID: [name] DOB: 10-13-1935  MR#: [id number]  XUX#:[id number]  Patient Care Team: Alisha Rail, MD as PCP - General (Internal Medicine) PCP: Alisha Rail, MD OTHER MD: [name], [name]  CHIEF COMPLAINT: pancytopenia  CURRENT TREATMENT: undergoing evaluation   HISTORY OF PRESENT ILLNESS: The patient was evaluated at her PCP's office 05/22/2015 with a complaint of DOE worsening over several  months. Labwork was obtained including a CBC, whioch showed WBC 1.8, platelets 14K, and  Hb 5.9 with an MCV of 97.5. DDimer was 1.04. Accordingly the patient was referred to the ED and was admitted 05/23/2015.   Labwork since admission includes a repeat CBC with WBC 1.7, HB 5.4, MCV 100.6 and platelets 10K. Creat was 1.04 with GFR 50. Reticulocytes are not elevated with abs 39.3 (1.7%) and LDH is normal at 188. Other labs are pending.  The patient tells me because of peripheral neuropathy she has been receiving monthly B-12 shots since 2005.  We were consulted for further evaluation and treatment of her pancytopenia  INTERVAL HISTORY: I met with [name] in her room 05/23/2015, with nurse but no family present  REVIEW OF SYSTEMS: Aside from feeling tired and SOB, she has felt well. She has had no fever, rash or bleeding. She does bruise easily. She denies h/a, N/V. dizzyness or confusion. No cough or phlegm production. Chronically constipated but no melena, black stools or BRBPR. Denies exposures to drugs other than her regular meds.  PAST MEDICAL HISTORY: Past Medical History  Diagnosis Date  . GERD (gastroesophageal reflux disease)   . Depression   . Hypertension   . Shortness of breath dyspnea     with exertion  . Arthritis     PAST SURGICAL HISTORY: Past Surgical History  Procedure Laterality Date  . Bone spur      R thigh  . Total shoulder arthroplasty  09/13/2011    Procedure: TOTAL  SHOULDER ARTHROPLASTY;  Surgeon: Alisha Schooling, MD;  Location: [facility];  Service: Orthopedics;  Laterality: Left;  LEFT SHOULDER REVERSED TOTAL SHOULDER ARTHROPLASTY  . Total knee arthroplasty  04/27/2012    Procedure: TOTAL KNEE ARTHROPLASTY;  Surgeon: Alisha Pole, MD;  Location: [facility];  Service: Orthopedics;  Laterality: Right;  . Eye surgery      bilateral cataract with lens implants  . Tonsillectomy      age 29  . Esophagogastroduodenoscopy (egd) with propofol N/A 11/08/2014    Procedure: ESOPHAGOGASTRODUODENOSCOPY (EGD) WITH PROPOFOL;  Surgeon: Garlan Fair, MD;  Location: [facility];  Service: Endoscopy;  Laterality: N/A;    FAMILY HISTORY Family History  Problem Relation Age of Onset  . Heart attack Mother 71    Died age 34  . Heart disease Brother     Atrial fib  Patient's father died age 79 with prostate cancer; mother died atv44 with an MI. One brother, alive at 50; one sister with parkinson's. No blood or cancer problems otherwise in family  GYNECOLOGIC HISTORY:  No LMP recorded. Patient is postmenopausal. Menarche age 19, first live birth age 85, she is GXP3; menopause age 52, no HR  SOCIAL HISTORY:  Grade school Pharmacist, hospital, retired; husband was a Higher education careers adviser. He has severe heart disease and she is his main caretaker. Son [name] works for [employer] and sings in the [employer]; daughter [name] lives in [place], a homemaker; daughter [name] works at the surgical center in [employer]  as an [name], sports. The patient has 7 gch. She is a Psychologist, forensic   ADVANCED DIRECTIVES: in place; the patient's son [name] is her [place]: Social History  Substance Use Topics  . Smoking status: Never Smoker   . Smokeless tobacco: Never Used  . Alcohol Use: No     Allergies  Allergen Reactions  . Cozaar [Losartan] Other (See Comments)    Cause pt to lose her taste for foods  . Codeine Itching and Other (See Comments)    Makes feel crazy  PT STATES SHE ALSO CAN NOT TAKE THE  SYNTHETIC CODEINES  . Hydrocodone Itching    Tolerates with benadryl  . Oxycodone Itching  . Tetanus Toxoids Swelling and Rash    Current Facility-Administered Medications  Medication Dose Route Frequency Provider Last Rate Last Dose  . 0.9 %  sodium chloride infusion  10 mL/hr Intravenous Once Alisha Chimes, PA-C   10 mL/hr at 05/23/15 1837  . 0.9 %  sodium chloride infusion   Intravenous Once Alisha A Regalado, MD      . acetaminophen (TYLENOL) tablet 650 mg  650 mg Oral Q6H PRN Alisha Parr, NP       Or  . acetaminophen (TYLENOL) suppository 650 mg  650 mg Rectal Q6H PRN Alisha Parr, NP      . alum & mag hydroxide-simeth (MAALOX/MYLANTA) 200-200-20 MG/5ML suspension 30 mL  30 mL Oral Q6H PRN Alisha Parr, NP      . Derrill Memo ON 05/24/2015] amLODipine (NORVASC) tablet 2.5 mg  2.5 mg Oral Daily Alisha A Regalado, MD      . multivitamin with minerals tablet 1 tablet  1 tablet Oral Daily Alisha Parr, NP      . ondansetron [facility]) tablet 4 mg  4 mg Oral Q6H PRN Alisha Parr, NP       Or  . ondansetron (ZOFRAN) injection 4 mg  4 mg Intravenous Q6H PRN Alisha Parr, NP      . Derrill Memo ON 05/24/2015] pantoprazole (PROTONIX) EC tablet 40 mg  40 mg Oral Q1200 Alisha Parr, NP      . sodium chloride 0.9 % injection 3 mL  3 mL Intravenous Q12H Alisha Parr, NP      . Derrill Memo ON 05/24/2015] venlafaxine XR (EFFEXOR-XR) 24 hr capsule 150 mg  150 mg Oral Q breakfast Alisha A Regalado, MD   150 mg at 05/23/15 1854    OBJECTIVE: elderly White woman examined in bed, looks well Filed Vitals:   05/23/15 1945 05/23/15 2000  BP:    Pulse:    Temp:    Resp: 19 19     There is no weight on file to calculate BMI.    ECOG FS:1 - Symptomatic but completely ambulatory  Ocular: Sclerae unicteric, EOMs intact Ear-nose-throat: Oropharynx clear, slightly dry Lymphatic: No cervical or supraclavicular adenopathy Lungs no rales or rhonchi, good excursion bilaterally Heart regular  rate and rhythm, no murmur appreciated Abd soft, nontender, positive bowel sounds MSK no focal spinal tenderness, no joint edema Neuro: non-focal, well-oriented, appropriate affect Breasts: deferred   LAB RESULTS:  CMP     Component Value Date/Time   NA 137 05/23/2015 1141   K 3.6 05/23/2015 1141   CL 104 05/23/2015 1141   CO2 26 05/23/2015 1141   GLUCOSE 99 05/23/2015 1141   BUN 17 05/23/2015 1141   CREATININE 1.04* 05/23/2015 1141   CALCIUM 8.9 05/23/2015 1141   PROT  6.3* 05/23/2015 1141   ALBUMIN 3.5 05/23/2015 1141   AST 16 05/23/2015 1141   ALT 11* 05/23/2015 1141   ALKPHOS 54 05/23/2015 1141   BILITOT 0.7 05/23/2015 1141   GFRNONAA 50* 05/23/2015 1141   GFRAA 59* 05/23/2015 1141    INo results found for: SPEP, UPEP  Lab Results  Component Value Date   WBC 1.7* 05/23/2015   NEUTROABS 0.7* 05/23/2015   HGB 5.4* 05/23/2015   HCT 16.7* 05/23/2015   MCV 100.6* 05/23/2015   PLT 10* 05/23/2015    '@LASTCHEMISTRY'$ @  No results found for: LABCA2  No components found for: LABCA125   Recent Labs Lab 05/23/15 1140  INR 1.33    Urinalysis    Component Value Date/Time   COLORURINE YELLOW 04/21/2012 0946   APPEARANCEUR CLOUDY* 04/21/2012 0946   LABSPEC 1.019 04/21/2012 0946   PHURINE 7.0 04/21/2012 0946   GLUCOSEU NEGATIVE 04/21/2012 0946   HGBUR NEGATIVE 04/21/2012 0946   BILIRUBINUR NEGATIVE 04/21/2012 0946   KETONESUR NEGATIVE 04/21/2012 0946   PROTEINUR NEGATIVE 04/21/2012 0946   UROBILINOGEN 1.0 04/21/2012 0946   NITRITE NEGATIVE 04/21/2012 0946   LEUKOCYTESUR NEGATIVE 04/21/2012 0946    STUDIES: No results found.  ASSESSMENT: 79 y.o. [place] woman admitted 05/23/2015 with pancytopenia in the setting of a several month's history of wrosening DOE and fatigue  Review of the blood film is non-diagnostic. Platelets are absent, no clusters. RBCs show significant anisocytosis, with some schistocytes (not prominent). WBCs show many mature lymphs,  many pelger-huet polys, a few better segmented polys, but no significant left shift. Specifically I don't see obvious blasts and also no NRBCs-- in short leukemia and MDS are in the differential but not apparent on the smear  PLAN: I do not believe we are dealing with DIC or TTP. The LDH is not elevated and the patient appears well, aside from anemia symptoms. Accordingly I would not proceed to plasma transfusion or exchange at this point.  If bleeding develops or platelets are transfused would obtain post transfusion count 2 hours after completed.  I discussed the situation in detail with the patient, who understands when all 3 cell lines are involved we are most likely dealing with a primary bone marrow problem. Theoretically this could de rheumatologic, toxic, nutritional or neoplastic. Unless the pending labs give [place] the answer, which I doubt, we will need a bone marrow biopsy. [place] understands this and is agreeable.  I have written the order for BMBX (NPO after MN). Will follow with you to definitive diagnosis and plan. Appreciate consulting on this patient.    [name], MD   05/23/2015 8:11 PM Medical Oncology and Hematology [facility] [address] [place], [place] [address] Tel. [phone]    Fax. [phone]

## 2015-05-23 NOTE — ED Notes (Signed)
PLACED IN A GOWN AND HOOKED UP TO THE MONITOR WITH A 5 LEAD, BP CUFF AND PULSE OX

## 2015-05-23 NOTE — ED Provider Notes (Signed)
CSN: [id number]     Arrival date & time 05/23/15  1023 History   First MD Initiated Contact with Patient 05/23/15 1253     Chief Complaint  Patient presents with  . Abnormal Lab    HPI   [name] is a 79 y.o. female with a PMH of GERD, depression, HTN who presents to the ED with pancytopenia. She was seen by her PCP yesterday for worsening dyspnea on exertion, at which time she had labs collected. She was subsequently advised to come to the ED due to abnormalities with her CBC. She reports progressively worsening shortness of breath over the past several months. She states daily activities, such as brushing her teeth, now cause her to feel fatigued. She reports associated generalized weakness and intermittent lightheadedness. She reports activities exacerbate her symptoms. She denies alleviating factors. She denies fever, chills, chest pain, abdominal pain, nausea, vomiting, diarrhea, constipation, hematochezia, melena. She states she has noticed that she is bruising more easily than normal.   Past Medical History  Diagnosis Date  . GERD (gastroesophageal reflux disease)   . Depression   . Hypertension   . Shortness of breath dyspnea     with exertion  . Arthritis    Past Surgical History  Procedure Laterality Date  . Bone spur      R thigh  . Total shoulder arthroplasty  09/13/2011    Procedure: TOTAL SHOULDER ARTHROPLASTY;  Surgeon: Augustin Schooling, MD;  Location: [facility];  Service: Orthopedics;  Laterality: Left;  LEFT SHOULDER REVERSED TOTAL SHOULDER ARTHROPLASTY  . Total knee arthroplasty  04/27/2012    Procedure: TOTAL KNEE ARTHROPLASTY;  Surgeon: Mauri Pole, MD;  Location: [facility];  Service: Orthopedics;  Laterality: Right;  . Eye surgery      bilateral cataract with lens implants  . Tonsillectomy      age 49  . Esophagogastroduodenoscopy (egd) with propofol N/A 11/08/2014    Procedure: ESOPHAGOGASTRODUODENOSCOPY (EGD) WITH PROPOFOL;  Surgeon: Garlan Fair, MD;   [facility];  Service: Endoscopy;  Laterality: N/A;   Family History  Problem Relation Age of Onset  . Heart attack Mother 20    Died age 69  . Heart disease Brother     Atrial fib   Social History  Substance Use Topics  . Smoking status: Never Smoker   . Smokeless tobacco: Never Used  . Alcohol Use: No   OB History    No data available      Review of Systems  Constitutional: Negative for fever and chills.  Respiratory: Positive for shortness of breath.   Cardiovascular: Positive for leg swelling. Negative for chest pain.  Gastrointestinal: Negative for nausea, vomiting, abdominal pain, diarrhea, constipation and blood in stool.  Neurological: Positive for weakness, light-headedness and numbness. Negative for dizziness and syncope.       Reports chronic decreased sensation in feet bilaterally due to B12 deficiency.  All other systems reviewed and are negative.     Allergies  Cozaar; Codeine; Hydrocodone; Oxycodone; and Tetanus toxoids  Home Medications   Prior to Admission medications   Medication Sig Start Date End Date Taking? Authorizing Provider  amLODipine (NORVASC) 2.5 MG tablet Take 1 tablet (2.5 mg total) by mouth daily. 08/10/14  Yes Minus Breeding, MD  aspirin EC 81 MG tablet Take 81 mg by mouth daily.   Yes Historical Provider, MD  Cyanocobalamin 1000 MCG/ML KIT Inject 1,000 mcg as directed every 30 (thirty) days. B 12 shot  Yes Historical Provider, MD  furosemide (LASIX) 20 MG tablet Take 0.5 tablets (10 mg total) by mouth daily as needed for fluid or edema. 03/03/15  Yes Minus Breeding, MD  Melatonin 5 MG TABS Take 2.5 mg by mouth at bedtime.   Yes Historical Provider, MD  Multiple Vitamin (MULTIVITAMIN WITH MINERALS) TABS tablet Take 1 tablet by mouth daily.   Yes Historical Provider, MD  pantoprazole (PROTONIX) 40 MG tablet Take 40 mg by mouth every morning.   Yes Historical Provider, MD  Venlafaxine HCl 150 MG TB24 Take 1 tablet (150 mg total) by  mouth daily. 04/20/15  Yes Golden Circle, FNP  Vitamin D, Ergocalciferol, (DRISDOL) 50000 UNITS CAPS capsule Take 50,000 Units by mouth See admin instructions. Every other week. 03/15/15  Yes Historical Provider, MD    BP 141/56 mmHg  Pulse 72  Temp(Src) 98.1 F (36.7 C) (Oral)  Resp 16  SpO2 99% Physical Exam  Constitutional: She is oriented to person, place, and time. She appears well-developed and well-nourished. No distress.  HENT:  Head: Normocephalic and atraumatic.  Right Ear: External ear normal.  Left Ear: External ear normal.  Nose: Nose normal.  Mouth/Throat: Uvula is midline and oropharynx is clear and moist. Mucous membranes are pale.  Eyes: Conjunctivae, EOM and lids are normal. Pupils are equal, round, and reactive to light. Right eye exhibits no discharge. Left eye exhibits no discharge. No scleral icterus.  Neck: Normal range of motion. Neck supple.  Cardiovascular: Normal rate, regular rhythm, normal heart sounds, intact distal pulses and normal pulses.   Pulmonary/Chest: Effort normal and breath sounds normal. No respiratory distress. She has no wheezes. She has no rales.  Abdominal: Soft. Normal appearance and bowel sounds are normal. She exhibits no distension and no mass. There is no tenderness. There is no rigidity, no rebound and no guarding.  Musculoskeletal: Normal range of motion. She exhibits edema. She exhibits no tenderness.  Trace lower extremity edema bilaterally.  Neurological: She is alert and oriented to person, place, and time. She has normal strength.  Decreased sensation to light touch to feet bilaterally, which patient states is chronic due to B12 deficiency.  Skin: Skin is warm, dry and intact. No rash noted. She is not diaphoretic. No erythema. No pallor.  Psychiatric: She has a normal mood and affect. Her speech is normal and behavior is normal.  Nursing note and vitals reviewed.   ED Course  Procedures (including critical care  time)  Labs Review Labs Reviewed  COMPREHENSIVE METABOLIC PANEL - Abnormal; Notable for the following:    Creatinine, Ser 1.04 (*)    Total Protein 6.3 (*)    ALT 11 (*)    GFR calc non Af Amer 50 (*)    GFR calc Af Amer 59 (*)    All other components within normal limits  CBC - Abnormal; Notable for the following:    WBC 1.7 (*)    RBC 1.66 (*)    Hemoglobin 5.4 (*)    HCT 16.7 (*)    MCV 100.6 (*)    RDW 25.1 (*)    Platelets 10 (*)    All other components within normal limits  DIFFERENTIAL - Abnormal; Notable for the following:    Neutro Abs 0.7 (*)    All other components within normal limits  DIC (DISSEMINATED INTRAVASCULAR COAGULATION) PANEL - Abnormal; Notable for the following:    Prothrombin Time 16.6 (*)    D-Dimer, Quant 1.45 (*)    Platelets  6 (*)    All other components within normal limits  TSH  LACTATE DEHYDROGENASE  HAPTOGLOBIN  VITAMIN B12  FOLATE  PATHOLOGIST SMEAR REVIEW  RETICULOCYTES  TYPE AND SCREEN  PREPARE RBC (CROSSMATCH)  DIRECT ANTIGLOBULIN TEST (NOT AT [facility])    Imaging Review No results found.   I have personally reviewed and evaluated these images and lab results as part of my medical decision-making.   EKG Interpretation   Date/Time:  Tuesday May 23 2015 13:27:32 EST Ventricular Rate:  74 PR Interval:  159 QRS Duration: 109 QT Interval:  402 QTC Calculation: 446 R Axis:   65 Text Interpretation:  Sinus rhythm No significant change was found  Confirmed by Wyvonnia Dusky  MD, STEPHEN [phone]) on 05/23/2015 1:35:44 PM      MDM   Final diagnoses:  Pancytopenia ([place])    79 year old female presents with pancytopenia. She was seen by her PCP yesterday for worsening shortness of breath, had labs collected, and was advised of results and told to come to the ED. She reports dyspnea on exertion for the past several months, and states daily activities cause her significant fatigue and generalized weakness. She also reports  lightheadedness. She denies LOC. She denies fever, chills, chest pain, abdominal pain, nausea, vomiting, diarrhea, constipation, hematochezia, melena.  Patient is afebrile. Vital signs stable. Mucous membranes pale. No tachycardia or hypotension. Heart regular rate and rhythm. Lungs clear to auscultation bilaterally. Abdomen soft, nontender, nondistended. Trace lower extremity edema bilaterally. Decreased sensation to lower extremities bilaterally, which patient states is chronic due to B12 deficiency. Strength intact bilaterally.  EKG remarkable for sinus rhythm, heart rate 74. CBC remarkable for WBC 1.7, hemoglobin 5.4, platelets 10. CMP remarkable for creatinine 1.04. DIC panel remarkable for INR within normal limits, d-dimer 1.45. pRBCs ordered (2 units) for transfusion.  Hospitalist consulted for admission. Spoke with hospitalist Otis Dials, NP) who will admit the patient for further evaluation and management. Advised calling hematology. Hematology consulted. Spoke with Dr. Lindi Adie - hematology will follow patient. Advised to add B12, folate, smear review, and coombs to orders.  BP 141/56 mmHg  Pulse 72  Temp(Src) 98.1 F (36.7 C) (Oral)  Resp 16  SpO2 99%    Marella Chimes, PA-C 05/23/15 [place], MD 05/23/15 [place], MD 05/23/15 1827

## 2015-05-23 NOTE — ED Notes (Signed)
MD at bedside. 

## 2015-05-23 NOTE — ED Notes (Signed)
Pt sts low hemoglobin and sent here for eval; pt sts increased fatigue and SOB x months with exertion

## 2015-05-23 NOTE — Consult Note (Signed)
See separate note.

## 2015-05-23 NOTE — H&P (Signed)
[facility] History and Physical                                                                                    [name], is a 79 y.o. female  MRN: [id number]   DOB - Mar 18, 1936  Admit Date - 05/23/2015  Outpatient Primary MD for the patient is Binnie Rail, MD  Referring MD: [place] / ER  With History of -  Past Medical History  Diagnosis Date  . GERD (gastroesophageal reflux disease)   . Depression   . Hypertension   . Shortness of breath dyspnea     with exertion  . Arthritis       Past Surgical History  Procedure Laterality Date  . Bone spur      R thigh  . Total shoulder arthroplasty  09/13/2011    Procedure: TOTAL SHOULDER ARTHROPLASTY;  Surgeon: Augustin Schooling, MD;  Location: [facility];  Service: Orthopedics;  Laterality: Left;  LEFT SHOULDER REVERSED TOTAL SHOULDER ARTHROPLASTY  . Total knee arthroplasty  04/27/2012    Procedure: TOTAL KNEE ARTHROPLASTY;  Surgeon: Mauri Pole, MD;  Location: [facility];  Service: Orthopedics;  Laterality: Right;  . Eye surgery      bilateral cataract with lens implants  . Tonsillectomy      age 19  . Esophagogastroduodenoscopy (egd) with propofol N/A 11/08/2014    Procedure: ESOPHAGOGASTRODUODENOSCOPY (EGD) WITH PROPOFOL;  Surgeon: Garlan Fair, MD;  Location: [facility];  Service: Endoscopy;  Laterality: N/A;    in for   Chief Complaint  Patient presents with  . Abnormal Lab     HPI This is a 79 year old female patient with past medical history of hypertension, depression on Effexor and associated chronic constipation, history of prior hyponatremia, obesity and known bilateral carotid bruits. Patient was recently evaluated by her primary care physician in regards to chronic and progressive fatigue and dyspnea on exertion over one year. 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 Because  of her symptoms her physician checked routine labs and she was pancytopenic with a white count of 1800, hemoglobin 5.9, hematocrit 17.8, platelets 14,000. Neutrophils were 34%, lymphocytes 46%, monocytes 16%, absolute neutrophils 0.7%, peripheral smear revealed schistocytes.. Partial anemia panel has resulted and iron is elevated at 191, saturation 56.8 and transferrin 240. Because of her dyspnea on exertion a proBNP was checked and it was 341. Last laboratory data we have available for this patient was in 2014 at which time her white count was 7300, hemoglobin 9.9 and platelets were 220,000. At that time her renal function was also normal with a BUN of 20 and a creatinine is 0.89. Patient has not had any GI bleeding symptomatology and is only complained of chronic constipation related to her Effexor. She has noticed increased bruising. Her gums have been bleeding when she brushes her teeth. She did undergo an EGD with Dr. Wynetta Emery in May 2016 out any apparent abnormalities noted.  In the ER patient was afebrile, BP was 126/54, respirations were 20 and  pulse was 86, room air sats were 98%. EDP repeated labs which revealed a white count of 1700, hemoglobin 5.4, hematocrit 16.7, platelets 10,000. Electrolytes panel completed today reveals slightly elevated creatinine of 1.04. ALT low at 11 but normal total bilirubin. EKG revealed sinus rhythm with ventricular rate 74 bpm, QTC 446 ms, no ischemic changes but she does have nonspecific ST segment flattening in the lateral leads.   Review of Systems   In addition to the HPI above,  No Fever-chills, myalgias or other constitutional symptoms No Headache, changes with Vision or hearing, new weakness, tingling, numbness in any extremity, No problems swallowing food or Liquids, indigestion/reflux No Chest pain, Cough, palpitations, orthopnea  No Abdominal pain, N/V; no melena or hematochezia, no dark tarry stools No dysuria, hematuria or flank pain No new skin  rashes, lesions, masses  No new joints pains-aches No recent weight gain or loss No polyuria, polydypsia or polyphagia,  *A full 10 point Review of Systems was done, except as stated above, all other Review of Systems were negative.  Social History Social History  Substance Use Topics  . Smoking status: Never Smoker   . Smokeless tobacco: Never Used  . Alcohol Use: No    Resides at: Private residence  Lives with: Lives with husband; she is primary caretaker noting he has LVAD in place  Ambulatory status: Without assistive devices   Family History Family History  Problem Relation Age of Onset  . Heart attack Mother 3    Died age 72  . Heart disease Brother     Atrial fib     Prior to Admission medications   Medication Sig Start Date End Date Taking? Authorizing Provider  amLODipine (NORVASC) 2.5 MG tablet Take 1 tablet (2.5 mg total) by mouth daily. 08/10/14  Yes Rollene Rotunda, MD  aspirin EC 81 MG tablet Take 81 mg by mouth daily.   Yes Historical Provider, MD  Cyanocobalamin 1000 MCG/ML KIT Inject 1,000 mcg as directed every 30 (thirty) days. B 12 shot   Yes Historical Provider, MD  furosemide (LASIX) 20 MG tablet Take 0.5 tablets (10 mg total) by mouth daily as needed for fluid or edema. 03/03/15  Yes Rollene Rotunda, MD  Melatonin 5 MG TABS Take 2.5 mg by mouth at bedtime.   Yes Historical Provider, MD  Multiple Vitamin (MULTIVITAMIN WITH MINERALS) TABS tablet Take 1 tablet by mouth daily.   Yes Historical Provider, MD  pantoprazole (PROTONIX) 40 MG tablet Take 40 mg by mouth every morning.   Yes Historical Provider, MD  Venlafaxine HCl 150 MG TB24 Take 1 tablet (150 mg total) by mouth daily. 04/20/15  Yes Veryl Speak, FNP  Vitamin D, Ergocalciferol, (DRISDOL) 50000 UNITS CAPS capsule Take 50,000 Units by mouth See admin instructions. Every other week. 03/15/15  Yes Historical Provider, MD    Allergies  Allergen Reactions  . Cozaar [Losartan] Other (See Comments)      Cause pt to lose her taste for foods  . Codeine Itching and Other (See Comments)    Makes feel crazy  PT STATES SHE ALSO CAN NOT TAKE THE SYNTHETIC CODEINES  . Hydrocodone Itching    Tolerates with benadryl  . Oxycodone Itching  . Tetanus Toxoids Swelling and Rash    Physical Exam  Vitals  Blood pressure 141/56, pulse 72, temperature 98.1 F (36.7 C), temperature source Oral, resp. rate 16, SpO2 99 %.   General:  In no acute distress, appears somewhat pale but otherwise appears healthy  Psych:  Normal affect, Denies Suicidal or Homicidal ideations, Awake Alert, Oriented X 3. Speech and thought patterns are clear and appropriate, no apparent short term memory deficits  Neuro:   No focal neurological deficits, CN II through XII intact, Strength 5/5 all 4 extremities, Sensation intact all 4 extremities.  ENT:  Ears and Eyes appear Normal, Conjunctivae pale, PER. Moist oral mucosa without erythema or exudates.  Neck:  Supple, No lymphadenopathy appreciated  Respiratory:  Symmetrical chest wall movement, Good air movement bilaterally, CTAB. Room Air  Cardiac:  RRR, No Murmurs, nonpitting ankle edema noted, no JVD, No carotid bruits, peripheral pulses palpable at 2+  Abdomen:  Positive bowel sounds, Soft, Non tender, Non distended,  No masses appreciated, no obvious hepatosplenomegaly  Skin:  No Cyanosis, Normal Skin Turgor, several scattered areas of bruises on the extremities  Extremities: Symmetrical without obvious trauma or injury,  no effusions.  Data Review  CBC  Recent Labs Lab 05/22/15 1422 05/23/15 1140 05/23/15 1141  WBC 1.8 Repeated and verified X2.*  --  1.7*  HGB 5.9 Repeated and verified X2.*  --  5.4*  HCT 17.8 Repeated and verified X2.*  --  16.7*  PLT 14.0 Repeated and verified X2.* 6* 10*  MCV 97.5  --  100.6*  MCH  --   --  32.5  MCHC 33.3  --  32.3  RDW 28.0*  --  25.1*  LYMPHSABS  --  0.9  --   MONOABS  --  0.3  --   EOSABS  --  0.1  --    BASOSABS  --  0.1  --     Chemistries   Recent Labs Lab 05/23/15 1141  NA 137  K 3.6  CL 104  CO2 26  GLUCOSE 99  BUN 17  CREATININE 1.04*  CALCIUM 8.9  AST 16  ALT 11*  ALKPHOS 54  BILITOT 0.7    estimated creatinine clearance is 45.7 mL/min (by C-G formula based on Cr of 1.04).  No results for input(s): TSH, T4TOTAL, T3FREE, THYROIDAB in the last 72 hours.  Invalid input(s): FREET3  Coagulation profile  Recent Labs Lab 05/23/15 1140  INR 1.33     Recent Labs  05/22/15 1422 05/23/15 1140  DDIMER 1.04* 1.45*    Cardiac Enzymes No results for input(s): CKMB, TROPONINI, MYOGLOBIN in the last 168 hours.  Invalid input(s): CK  Invalid input(s): POCBNP  Urinalysis    Component Value Date/Time   COLORURINE YELLOW 04/21/2012 0946   APPEARANCEUR CLOUDY* 04/21/2012 0946   LABSPEC 1.019 04/21/2012 0946   PHURINE 7.0 04/21/2012 0946   GLUCOSEU NEGATIVE 04/21/2012 0946   HGBUR NEGATIVE 04/21/2012 0946   BILIRUBINUR NEGATIVE 04/21/2012 0946   KETONESUR NEGATIVE 04/21/2012 0946   PROTEINUR NEGATIVE 04/21/2012 0946   UROBILINOGEN 1.0 04/21/2012 0946   NITRITE NEGATIVE 04/21/2012 0946   LEUKOCYTESUR NEGATIVE 04/21/2012 0946    Imaging results:   No results found.   EKG: (Independently reviewed) sinus rhythm with ventricular rate 74 bpm, QTC 446 ms, no ischemic changes but she does have nonspecific ST segment flattening in the lateral leads.    Assessment & Plan  Principal Problem:   Pancytopenia ([place]) -Step down -Has been symptomatic for about 12 months based on patient report -EDP is contacted hematology for consultation -Iron level slightly elevated and likely reflective of attempt to raise hemoglobin and stimulate bone marrow -Positive for schistocytes concerning for hemolytic type process therefore we'll check LDH, haptoglobin, DAT -Complete anemia panel by checking  serum folate and B12 and check TSH as well -Suspect we'll need to undergo  bone marrow biopsy for definitive diagnosis -Symptomatic with anemia so EDP is ordered 2 units of packed red blood cells -CBC in a.m. and after blood cells infused -No active bleeding so no indication at this juncture to infuse platelets -Signs or symptoms of infection in setting of low white count  Active Problems:   AKI (acute kidney injury) ([place]) -Likely related to ongoing low perfusion in setting of anemia -Patient also had nocturnal ankle edema and has periodically taking Lasix of this could contribute as well    HTN (hypertension) -Blood pressure currently controlled -Continue preadmission Norvasc but hold Lasix -Last echocardiogram was in 2015 revealed moderate LVH and grade 1 diastolic dysfunction with greasy of pulmonary hypertension; in setting of anemia repeat echocardiogram this admission    Depression -Continue Effexor for now -Sometimes this medication can cause blood disc she is in abnormalities with platelets; her to hematology whether this medication should be discontinued    Chronic constipation -Patient states is secondary to Effexor -Follow    DVT Prophylaxis: SCDs  Family Communication:   Husband at bedside  Code Status: Full code   Condition:  Guarded  Discharge disposition: Anticipate discharge back to home environment once medically stable  Time spent in minutes : 60      ELLIS,ALLISON L. ANP on 05/23/2015 at 3:32 PM  Between 7am to 7pm - Pager - [phone]  After 7pm go to [email or web address] - password [email or web address]  And look for the night coverage person covering me after hours  [facility]

## 2015-05-23 NOTE — ED Notes (Signed)
Patient has signed consent.

## 2015-05-24 ENCOUNTER — Inpatient Hospital Stay (HOSPITAL_COMMUNITY): Payer: Medicare Other

## 2015-05-24 DIAGNOSIS — R0600 Dyspnea, unspecified: Secondary | ICD-10-CM

## 2015-05-24 DIAGNOSIS — N179 Acute kidney failure, unspecified: Secondary | ICD-10-CM

## 2015-05-24 DIAGNOSIS — K5900 Constipation, unspecified: Secondary | ICD-10-CM

## 2015-05-24 LAB — COMPREHENSIVE METABOLIC PANEL
ALBUMIN: 3.4 g/dL — AB (ref 3.5–5.0)
ALT: 11 U/L — AB (ref 14–54)
AST: 15 U/L (ref 15–41)
Alkaline Phosphatase: 56 U/L (ref 38–126)
Anion gap: 6 (ref 5–15)
BUN: 16 mg/dL (ref 6–20)
CHLORIDE: 103 mmol/L (ref 101–111)
CO2: 29 mmol/L (ref 22–32)
CREATININE: 0.99 mg/dL (ref 0.44–1.00)
Calcium: 8.8 mg/dL — ABNORMAL LOW (ref 8.9–10.3)
GFR calc Af Amer: >60 mL/min (ref >60)
GFR calc non Af Amer: 54 mL/min — ABNORMAL LOW (ref >60)
GLUCOSE: 123 mg/dL — AB (ref 65–99)
POTASSIUM: 3.1 mmol/L — AB (ref 3.5–5.1)
SODIUM: 138 mmol/L (ref 135–145)
Total Bilirubin: 1.3 mg/dL — ABNORMAL HIGH (ref 0.3–1.2)
Total Protein: 6.3 g/dL — ABNORMAL LOW (ref 6.5–8.1)

## 2015-05-24 LAB — CBC
HEMATOCRIT: 26.7 % — AB (ref 36.0–46.0)
HEMOGLOBIN: 8.6 g/dL — AB (ref 12.0–15.0)
MCH: 28.9 pg (ref 26.0–34.0)
MCHC: 32.2 g/dL (ref 30.0–36.0)
MCV: 89.6 fL (ref 78.0–100.0)
Platelets: 52 10*3/uL — ABNORMAL LOW (ref 150–400)
RBC: 2.98 MIL/uL — AB (ref 3.87–5.11)
RDW: 25.5 % — ABNORMAL HIGH (ref 11.5–15.5)
WBC: 2.1 10*3/uL — ABNORMAL LOW (ref 4.0–10.5)

## 2015-05-24 LAB — HAPTOGLOBIN: Haptoglobin: 40 mg/dL (ref 34–200)

## 2015-05-24 LAB — PROTIME-INR
INR: 1.29 (ref 0.00–1.49)
PROTHROMBIN TIME: 16.2 s — AB (ref 11.6–15.2)

## 2015-05-24 LAB — PATHOLOGIST SMEAR REVIEW

## 2015-05-24 MED ORDER — POTASSIUM CHLORIDE CRYS ER 20 MEQ PO TBCR
40.0000 meq | EXTENDED_RELEASE_TABLET | Freq: Once | ORAL | Status: AC
  Administered 2015-05-24: 40 meq via ORAL
  Filled 2015-05-24: qty 2

## 2015-05-24 NOTE — Progress Notes (Signed)
Utilization Review Completed.[name] T11/30/2016  

## 2015-05-24 NOTE — Progress Notes (Signed)
[facility] PROGRESS NOTE  [name] [id number] DOB: February 08, 1936 DOA: 05/23/2015 PCP: [name], FNP  Assessment/Plan: Pancytopenia ([place]) -due to primary bone marrow disease -Appreciate hematology consult by Dr.Magrinat -IR following plan for bone marrow aspiration and biopsy tomorrow - Peripheral smear remarkable for schistocytes and atypical mononuclear cells -Status post 2 units PRBC transfusion yesterday -Iron studies, B-12, LDH, haptoglobin unremarkable   AKI (acute kidney injury) ([place]) -Likely related to ongoing low perfusion in setting of anemia -Resolved   HTN (hypertension) -Blood pressure currently controlled -Continue Norvasc but hold Lasix -Last echocardiogram was in 2015 revealed moderate LVH and grade 1 diastolic dysfunctio   Depression -Continue Effexor for now   Chronic constipation -Patient states is secondary to Effexor   Hypokalemia -Replace  DVT Prophylaxis: SCDs  Code Status: Full code Family Communication: none at bedside Disposition Plan: keep in SDU today   Consultants:  Heme  IR  HPI/Subjective: Feels ok, in good spirits  Objective: Filed Vitals:   05/24/15 0443 05/24/15 0904  BP: 150/55 148/62  Pulse:    Temp: 97.7 F (36.5 C)   Resp:      Intake/Output Summary (Last 24 hours) at 05/24/15 1148 Last data filed at 05/24/15 1100  Gross per 24 hour  Intake   1230 ml  Output    125 ml  Net   1105 ml   Filed Weights   05/23/15 2000  Weight: 76.658 kg (169 lb)    Exam:   General:  AAOx3, no distress  Cardiovascular: S1S2/RRR  Respiratory: CTAB  Abdomen: soft, NT, bS present  Musculoskeletal: no edema c/c   Data Reviewed: Basic Metabolic Panel:  Recent Labs Lab 05/23/15 1141 05/24/15 0415  NA 137 138  K 3.6 3.1*  CL 104 103  CO2 26 29  GLUCOSE 99 123*  BUN 17 16  CREATININE 1.04* 0.99  CALCIUM 8.9 8.8*   Liver Function Tests:  Recent Labs Lab 05/23/15 1141  05/24/15 0415  AST 16 15  ALT 11* 11*  ALKPHOS 54 56  BILITOT 0.7 1.3*  PROT 6.3* 6.3*  ALBUMIN 3.5 3.4*   No results for input(s): LIPASE, AMYLASE in the last 168 hours. No results for input(s): AMMONIA in the last 168 hours. CBC:  Recent Labs Lab 05/22/15 1422 05/23/15 1140 05/23/15 1141 05/23/15 1903 05/24/15 0731  WBC 1.8 Repeated and verified X2.*  --  1.7* 2.1* 2.1*  NEUTROABS  --  0.7*  --   --   --   HGB 5.9 Repeated and verified X2.*  --  5.4* 6.8* 8.6*  HCT 17.8 Repeated and verified X2.*  --  16.7* 21.0* 26.7*  MCV 97.5  --  100.6* 93.8 89.6  PLT 14.0 Repeated and verified X2.* 6* 10* 9* 52*   Cardiac Enzymes: No results for input(s): CKTOTAL, CKMB, CKMBINDEX, TROPONINI in the last 168 hours. BNP (last 3 results) No results for input(s): BNP in the last 8760 hours.  ProBNP (last 3 results)  Recent Labs  05/22/15 1422  PROBNP 341.0*    CBG: No results for input(s): GLUCAP in the last 168 hours.  No results found for this or any previous visit (from the past 240 hour(s)).   Studies: No results found.  Scheduled Meds: . sodium chloride  10 mL/hr Intravenous Once  . sodium chloride   Intravenous Once  . amLODipine  2.5 mg Oral Daily  . multivitamin with minerals  1 tablet Oral Daily  . pantoprazole  40 mg Oral Q1200  .  sodium chloride  3 mL Intravenous Q12H  . venlafaxine XR  150 mg Oral Q breakfast   Continuous Infusions:  Antibiotics Given (last 72 hours)    None      Principal Problem:   Pancytopenia ([place]) Active Problems:   Depression   AKI (acute kidney injury) ([place])   HTN (hypertension)   Chronic constipation    Time spent: 72min    [name]  [facility] Pager [phone]. If 7PM-7AM, please contact night-coverage at [email or web address], password [facility] 05/24/2015, 11:48 AM  LOS: 1 day

## 2015-05-24 NOTE — Consult Note (Signed)
Chief Complaint: Patient was seen in consultation today for CT-guided bone marrow biopsy Chief Complaint  Patient presents with  . Abnormal Lab    Referring Physician(s): Magrinat,G  History of Present Illness: [name] is a 79 y.o. female with history of worsening dyspnea/fatigue over the past several months and recent laboratory work which revealed pancytopenia. She was recently seen by oncology and request is now made for CT-guided bone marrow biopsy for further evaluation.  Past Medical History  Diagnosis Date  . GERD (gastroesophageal reflux disease)   . Depression   . Hypertension   . Shortness of breath dyspnea     with exertion  . History of blood transfusion 05/23/2015    "Hgb 5.9"  . Anemia   . Arthritis     "hand joints; hips; back" (05/23/2015)  . Chronic lower back pain   . Anxiety     Past Surgical History  Procedure Laterality Date  . Bone spur      R thigh  . Total shoulder arthroplasty  09/13/2011    Procedure: TOTAL SHOULDER ARTHROPLASTY;  Surgeon: Augustin Schooling, MD;  Location: [facility];  Service: Orthopedics;  Laterality: Left;  LEFT SHOULDER REVERSED TOTAL SHOULDER ARTHROPLASTY  . Total knee arthroplasty  04/27/2012    Procedure: TOTAL KNEE ARTHROPLASTY;  Surgeon: Mauri Pole, MD;  Location: [facility];  Service: Orthopedics;  Laterality: Right;  . Cataract extraction w/ intraocular lens  implant, bilateral  ~ 2005  . Tonsillectomy  ~ 1950  . Esophagogastroduodenoscopy (egd) with propofol N/A 11/08/2014    Procedure: ESOPHAGOGASTRODUODENOSCOPY (EGD) WITH PROPOFOL;  Surgeon: Garlan Fair, MD;  Location[facility];  Service: Endoscopy;  Laterality: N/A;  . Joint replacement    . Dilation and curettage of uterus      Allergies: Cozaar; Codeine; Hydrocodone; Oxycodone; and Tetanus toxoids  Medications: Prior to Admission medications   Medication Sig Start Date End Date Taking? Authorizing Provider  amLODipine (NORVASC) 2.5 MG  tablet Take 1 tablet (2.5 mg total) by mouth daily. 08/10/14  Yes Minus Breeding, MD  aspirin EC 81 MG tablet Take 81 mg by mouth daily.   Yes Historical Provider, MD  Cyanocobalamin 1000 MCG/ML KIT Inject 1,000 mcg as directed every 30 (thirty) days. B 12 shot   Yes Historical Provider, MD  furosemide (LASIX) 20 MG tablet Take 0.5 tablets (10 mg total) by mouth daily as needed for fluid or edema. 03/03/15  Yes Minus Breeding, MD  Melatonin 5 MG TABS Take 2.5 mg by mouth at bedtime.   Yes Historical Provider, MD  Multiple Vitamin (MULTIVITAMIN WITH MINERALS) TABS tablet Take 1 tablet by mouth daily.   Yes Historical Provider, MD  pantoprazole (PROTONIX) 40 MG tablet Take 40 mg by mouth every morning.   Yes Historical Provider, MD  Venlafaxine HCl 150 MG TB24 Take 1 tablet (150 mg total) by mouth daily. 04/20/15  Yes Golden Circle, FNP  Vitamin D, Ergocalciferol, (DRISDOL) 50000 UNITS CAPS capsule Take 50,000 Units by mouth See admin instructions. Every other week. 03/15/15  Yes Historical Provider, MD     Family History  Problem Relation Age of Onset  . Heart attack Mother 1    Died age 79  . Heart disease Brother     Atrial fib    Social History   Social History  . Marital Status: Married    Spouse Name: N/A  . Number of Children: 3  . Years of Education: 91  Social History Main Topics  . Smoking status: Never Smoker   . Smokeless tobacco: Never Used  . Alcohol Use: No  . Drug Use: No  . Sexual Activity: Not Currently   Other Topics Concern  . None   Social History Narrative   Retired Pharmacist, hospital   Denies abuse and feels safe at home      Review of Systems  Constitutional: Positive for fatigue. Negative for fever and chills.  Respiratory: Positive for shortness of breath. Negative for cough.   Cardiovascular: Negative for chest pain.  Gastrointestinal: Negative for nausea, vomiting, abdominal pain and blood in stool.  Genitourinary: Negative for dysuria and hematuria.    Musculoskeletal: Negative for back pain.  Neurological: Negative for headaches.    Vital Signs: BP 148/62 mmHg  Pulse 83  Temp(Src) 97.7 F (36.5 C) (Oral)  Resp 17  Ht $R'5\' 8"'OF$  (1.727 m)  Wt 169 lb (76.658 kg)  BMI 25.70 kg/m2  SpO2 98%  Physical Exam  Constitutional: She is oriented to person, place, and time. She appears well-developed and well-nourished.  Cardiovascular: Normal rate and regular rhythm.   Pulmonary/Chest: Effort normal and breath sounds normal.  Abdominal: Soft. Bowel sounds are normal. There is no tenderness.  Musculoskeletal: Normal range of motion. She exhibits no edema.  Neurological: She is alert and oriented to person, place, and time.    Mallampati Score:     Imaging: No results found.  Labs:  CBC:  Recent Labs  05/22/15 1422 05/23/15 1140 05/23/15 1141 05/23/15 1903 05/24/15 0731  WBC 1.8 Repeated and verified X2.*  --  1.7* 2.1* 2.1*  HGB 5.9 Repeated and verified X2.*  --  5.4* 6.8* 8.6*  HCT 17.8 Repeated and verified X2.*  --  16.7* 21.0* 26.7*  PLT 14.0 Repeated and verified X2.* 6* 10* 9* 52*    COAGS:  Recent Labs  05/23/15 1140 05/24/15 0415  INR 1.33 1.29  APTT 33  --     BMP:  Recent Labs  05/23/15 1141 05/24/15 0415  NA 137 138  K 3.6 3.1*  CL 104 103  CO2 26 29  GLUCOSE 99 123*  BUN 17 16  CALCIUM 8.9 8.8*  CREATININE 1.04* 0.99  GFRNONAA 50* 54*  GFRAA 59* >60    LIVER FUNCTION TESTS:  Recent Labs  05/23/15 1141 05/24/15 0415  BILITOT 0.7 1.3*  AST 16 15  ALT 11* 11*  ALKPHOS 54 56  PROT 6.3* 6.3*  ALBUMIN 3.5 3.4*    TUMOR MARKERS: No results for input(s): AFPTM, CEA, CA199, CHROMGRNA in the last 8760 hours.  Assessment and Plan: Patient with history of worsening fatigue, dyspnea, now with pancytopenia. Request received for CT-guided bone marrow biopsy.Risks and benefits discussed with the patient/family including, but not limited to bleeding, infection, damage to adjacent structures  or low yield requiring additional tests.All of the patient's questions were answered, patient is agreeable to proceed.Consent signed and in chart. Patient's potassium this morning was 3.1. She has since received oral supplementation. Procedure tentatively scheduled for 12/1 AM.     Thank you for this interesting consult.  I greatly enjoyed meeting [name] and look forward to participating in their care.  A copy of this report was sent to the requesting provider on this date.  Signed: D. Rowe Robert 05/24/2015, 9:39 AM   I spent a total of 20 minutes in face to face in clinical consultation, greater than 50% of which was counseling/coordinating care for CT guided bone marrow  biopsy

## 2015-05-24 NOTE — Progress Notes (Signed)
Echocardiogram 2D Echocardiogram has been performed.  [name] 05/24/2015, 2:30 PM

## 2015-05-25 ENCOUNTER — Inpatient Hospital Stay (HOSPITAL_COMMUNITY): Payer: Medicare Other

## 2015-05-25 ENCOUNTER — Telehealth: Payer: Self-pay | Admitting: Oncology

## 2015-05-25 DIAGNOSIS — I10 Essential (primary) hypertension: Secondary | ICD-10-CM

## 2015-05-25 LAB — CBC WITH DIFFERENTIAL/PLATELET
BASOS ABS: 0 10*3/uL (ref 0.0–0.1)
BASOS PCT: 0 %
EOS ABS: 0 10*3/uL (ref 0.0–0.7)
Eosinophils Relative: 1 %
HEMATOCRIT: 23.4 % — AB (ref 36.0–46.0)
HEMOGLOBIN: 7.7 g/dL — AB (ref 12.0–15.0)
Lymphocytes Relative: 66 %
Lymphs Abs: 1.5 10*3/uL (ref 0.7–4.0)
MCH: 29.8 pg (ref 26.0–34.0)
MCHC: 32.9 g/dL (ref 30.0–36.0)
MCV: 90.7 fL (ref 78.0–100.0)
Monocytes Absolute: 0.4 10*3/uL (ref 0.1–1.0)
Monocytes Relative: 16 %
NEUTROS ABS: 0.4 10*3/uL — AB (ref 1.7–7.7)
NEUTROS PCT: 17 %
Platelets: 41 10*3/uL — ABNORMAL LOW (ref 150–400)
RBC: 2.58 MIL/uL — ABNORMAL LOW (ref 3.87–5.11)
RDW: 25.2 % — ABNORMAL HIGH (ref 11.5–15.5)
WBC: 2.3 10*3/uL — AB (ref 4.0–10.5)

## 2015-05-25 LAB — BASIC METABOLIC PANEL
ANION GAP: 6 (ref 5–15)
BUN: 17 mg/dL (ref 6–20)
CALCIUM: 8.8 mg/dL — AB (ref 8.9–10.3)
CHLORIDE: 104 mmol/L (ref 101–111)
CO2: 28 mmol/L (ref 22–32)
Creatinine, Ser: 0.93 mg/dL (ref 0.44–1.00)
GFR calc non Af Amer: 58 mL/min — ABNORMAL LOW (ref >60)
Glucose, Bld: 96 mg/dL (ref 65–99)
Potassium: 4.1 mmol/L (ref 3.5–5.1)
SODIUM: 138 mmol/L (ref 135–145)

## 2015-05-25 LAB — PREPARE PLATELET PHERESIS: Unit division: 0

## 2015-05-25 LAB — BONE MARROW EXAM

## 2015-05-25 LAB — PREPARE RBC (CROSSMATCH)

## 2015-05-25 MED ORDER — LIDOCAINE HCL 1 % IJ SOLN
INTRAMUSCULAR | Status: AC
  Filled 2015-05-25: qty 20

## 2015-05-25 MED ORDER — SODIUM CHLORIDE 0.9 % IV SOLN
Freq: Once | INTRAVENOUS | Status: DC
Start: 2015-05-25 — End: 2015-05-26

## 2015-05-25 MED ORDER — SODIUM CHLORIDE 0.9 % IV SOLN
INTRAVENOUS | Status: AC | PRN
  Administered 2015-05-25: 10 mL/h via INTRAVENOUS

## 2015-05-25 MED ORDER — MIDAZOLAM HCL 2 MG/2ML IJ SOLN
INTRAMUSCULAR | Status: AC | PRN
  Administered 2015-05-25: 1 mg via INTRAVENOUS
  Administered 2015-05-25: 0.5 mg via INTRAVENOUS

## 2015-05-25 MED ORDER — FENTANYL CITRATE (PF) 100 MCG/2ML IJ SOLN
INTRAMUSCULAR | Status: AC | PRN
  Administered 2015-05-25 (×2): 25 ug via INTRAVENOUS

## 2015-05-25 MED ORDER — MIDAZOLAM HCL 2 MG/2ML IJ SOLN
INTRAMUSCULAR | Status: AC
  Filled 2015-05-25: qty 4

## 2015-05-25 MED ORDER — FENTANYL CITRATE (PF) 100 MCG/2ML IJ SOLN
INTRAMUSCULAR | Status: AC
  Filled 2015-05-25: qty 2

## 2015-05-25 MED ORDER — FUROSEMIDE 10 MG/ML IJ SOLN
20.0000 mg | Freq: Once | INTRAMUSCULAR | Status: AC
  Administered 2015-05-25: 20 mg via INTRAVENOUS
  Filled 2015-05-25: qty 2

## 2015-05-25 NOTE — Progress Notes (Signed)
Pt transferred to 6E-01 via hospital bed on portable tele and RA, accompanied by RN and NA. No events during transfer. Upon arrival to the unit, pt HDS, no s/s of resp distress, and IV in tact. Report called to Tyna Jaksch, RN prior to transfer with all questions answered per receiving RN, Delana Meyer. Family bedside with pt belongings in [employer].

## 2015-05-25 NOTE — Procedures (Signed)
Interventional Radiology Procedure Note  Procedure: CT guided aspirate and core biopsy of right iliac bone Complications: None Recommendations: - Bedrest supine x 1 hrs - Hydrocodone PRN  Pain - Follow biopsy results  Signed,  Whit Bruni K. Shuna Tabor, MD   

## 2015-05-25 NOTE — Telephone Encounter (Signed)
Hosp F/u appt- s/w nurse Tia and gave appt for 12/09 @ 8:30 w/Dr. Jana Hakim.   Calendar mailed.

## 2015-05-25 NOTE — Progress Notes (Signed)
Patient had biopsy today, results will be out tomorrow-- I have discussed with pathology.  I will see pt in hospital tomorrow afternoon after morning clinc. I have also made her an appt with me at the cancer center 12/09 at 8:30 AM  She might benefit from another unit (or 2) of PRBCs if discharge tomorrow is being contemplated)  GM

## 2015-05-25 NOTE — Care Management Note (Signed)
Case Management Note  Patient Details  Name: [name] MRN: [id number] Date of Birth: [birth year]  Subjective/Objective:    Pt lives with spouse who uses a walker for ambulation.  She has cane, walker, and w/c.  Dtr lives in the area and is supportive.               Expected Discharge Plan:  [place]  Discharge planning Services  CM Consult  Post Acute Care Choice:    Choice offered to:     DME Arranged:    DME Agency:     HH Arranged:    HH Agency:     Status of Service:  In process, will continue to follow  Girard Cooter, RN 05/25/2015, 12:41 PM

## 2015-05-25 NOTE — Progress Notes (Signed)
[facility] PROGRESS NOTE  [name] [id number] DOB: June 24, 1936 DOA: 05/23/2015 PCP: Alisha Po, FNP  Assessment/Plan: Pancytopenia ([place]) -due to primary bone marrow disease -Appreciate hematology consult by Dr.Magrinat -IR following for bone marrow aspiration and biopsy today -Peripheral smear remarkable for schistocytes and atypical mononuclear cells -Status post 2 units PRBC transfusion and 1 unit platelet -Iron studies, B-12, LDH, haptoglobin unremarkable  AKI (acute kidney injury) ([place]) -Likely related to ongoing low perfusion in setting of anemia -Resolved   HTN (hypertension) -Blood pressure currently controlled -Continue Norvasc, Lasix on hold -Last echocardiogram was in 2015 revealed moderate LVH and grade 1 diastolic dysfunction   Depression -Continue Effexor for now   Chronic constipation -secondary to Effexor   Hypokalemia -Replace  DVT Prophylaxis: SCDs  Code Status: Full code Family Communication: none at bedside Disposition Plan: DC home if ok with Dr.Magrinat after BM biopsy   Consultants:  Heme  IR  HPI/Subjective: Feels ok, in good spirits  Objective: Filed Vitals:   05/25/15 1150 05/25/15 1200  BP: 152/65 124/68  Pulse: 70 76  Temp:    Resp: 14 14    Intake/Output Summary (Last 24 hours) at 05/25/15 1259 Last data filed at 05/24/15 2036  Gross per 24 hour  Intake    243 ml  Output      0 ml  Net    243 ml   Filed Weights   05/23/15 2000  Weight: 76.658 kg (169 lb)    Exam:   General:  AAOx3, no distress  Cardiovascular: S1S2/RRR  Respiratory: CTAB  Abdomen: soft, NT, bS present  Musculoskeletal: no edema c/c   Data Reviewed: Basic Metabolic Panel:  Recent Labs Lab 05/23/15 1141 05/24/15 0415 05/25/15 0332  NA 137 138 138  K 3.6 3.1* 4.1  CL 104 103 104  CO2 26 29 28   GLUCOSE 99 123* 96  BUN 17 16 17   CREATININE 1.04* 0.99 0.93  CALCIUM 8.9 8.8* 8.8*   Liver Function  Tests:  Recent Labs Lab 05/23/15 1141 05/24/15 0415  AST 16 15  ALT 11* 11*  ALKPHOS 54 56  BILITOT 0.7 1.3*  PROT 6.3* 6.3*  ALBUMIN 3.5 3.4*   No results for input(s): LIPASE, AMYLASE in the last 168 hours. No results for input(s): AMMONIA in the last 168 hours. CBC:  Recent Labs Lab 05/22/15 1422 05/23/15 1140 05/23/15 1141 05/23/15 1903 05/24/15 0731 05/25/15 0332  WBC 1.8 Repeated and verified X2.*  --  1.7* 2.1* 2.1* 2.3*  NEUTROABS  --  0.7*  --   --   --  0.4*  HGB 5.9 Repeated and verified X2.*  --  5.4* 6.8* 8.6* 7.7*  HCT 17.8 Repeated and verified X2.*  --  16.7* 21.0* 26.7* 23.4*  MCV 97.5  --  100.6* 93.8 89.6 90.7  PLT 14.0 Repeated and verified X2.* 6* 10* 9* 52* 41*   Cardiac Enzymes: No results for input(s): CKTOTAL, CKMB, CKMBINDEX, TROPONINI in the last 168 hours. BNP (last 3 results) No results for input(s): BNP in the last 8760 hours.  ProBNP (last 3 results)  Recent Labs  05/22/15 1422  PROBNP 341.0*    CBG: No results for input(s): GLUCAP in the last 168 hours.  No results found for this or any previous visit (from the past 240 hour(s)).   Studies: Ct Biopsy  05/25/2015  CLINICAL DATA:  79 year old female with pancytopenia EXAM: CT BIOPSY Date: 05/25/2015 PROCEDURE: 1. CT guided bone marrow aspiration and core biopsy Interventional Radiologist:  Criselda Peaches, MD ANESTHESIA/SEDATION: Moderate (conscious) sedation was used. 1.5 mg Versed, 50 mcg Fentanyl were administered intravenously. The patient's vital signs were monitored continuously by radiology nursing throughout the procedure. Sedation Time: 15 minutes FLUOROSCOPY TIME:  None TECHNIQUE: Informed consent was obtained from the patient following explanation of the procedure, risks, benefits and alternatives. The patient understands, agrees and consents for the procedure. All questions were addressed. A time out was performed. The patient was positioned prone and noncontrast  localization CT was performed of the pelvis to demonstrate the iliac marrow spaces. Maximal barrier sterile technique utilized including caps, mask, sterile gowns, sterile gloves, large sterile drape, hand hygiene, and betadine prep. Under sterile conditions and local anesthesia, an 11 gauge coaxial bone biopsy needle was advanced into the right iliac marrow space. Needle position was confirmed with CT imaging. Initially, bone marrow aspiration was performed. Next, the 11 gauge outer cannula was utilized to obtain a right iliac bone marrow core biopsy. A second core biopsy was then obtained for culture. Needle was removed. Hemostasis was obtained with compression. The patient tolerated the procedure well. Samples were prepared with the cytotechnologist. No immediate complications. IMPRESSION: CT guided right iliac bone marrow aspiration and core biopsy. An additional sample was obtained and sent for culture. Signed, Criselda Peaches, MD [facility] Radiology Electronically Signed   By: Jacqulynn Cadet M.D.   On: 05/25/2015 12:37    Scheduled Meds: . sodium chloride  10 mL/hr Intravenous Once  . sodium chloride   Intravenous Once  . amLODipine  2.5 mg Oral Daily  . fentaNYL      . lidocaine      . midazolam      . multivitamin with minerals  1 tablet Oral Daily  . pantoprazole  40 mg Oral Q1200  . sodium chloride  3 mL Intravenous Q12H  . venlafaxine XR  150 mg Oral Q breakfast   Continuous Infusions:  Antibiotics Given (last 72 hours)    None      Principal Problem:   Pancytopenia ([place]) Active Problems:   Depression   AKI (acute kidney injury) ([place])   HTN (hypertension)   Chronic constipation    Time spent: 52min    [name]  [facility] Pager [phone]. If 7PM-7AM, please contact night-coverage at [email or web address], password [facility] 05/25/2015, 12:59 PM  LOS: 2 days

## 2015-05-26 DIAGNOSIS — D696 Thrombocytopenia, unspecified: Secondary | ICD-10-CM

## 2015-05-26 DIAGNOSIS — D649 Anemia, unspecified: Secondary | ICD-10-CM

## 2015-05-26 LAB — TYPE AND SCREEN
ABO/RH(D): O POS
ANTIBODY SCREEN: NEGATIVE
UNIT DIVISION: 0
UNIT DIVISION: 0
Unit division: 0
Unit division: 0

## 2015-05-26 LAB — CBC
HCT: 31.6 % — ABNORMAL LOW (ref 36.0–46.0)
Hemoglobin: 10.5 g/dL — ABNORMAL LOW (ref 12.0–15.0)
MCH: 30 pg (ref 26.0–34.0)
MCHC: 33.2 g/dL (ref 30.0–36.0)
MCV: 90.3 fL (ref 78.0–100.0)
PLATELETS: 35 10*3/uL — AB (ref 150–400)
RBC: 3.5 MIL/uL — AB (ref 3.87–5.11)
RDW: 21.8 % — ABNORMAL HIGH (ref 11.5–15.5)
WBC: 2.6 10*3/uL — AB (ref 4.0–10.5)

## 2015-05-26 MED ORDER — FUROSEMIDE 20 MG PO TABS: 20.0000 mg | ORAL_TABLET | Freq: Every day | ORAL | Status: DC | PRN

## 2015-05-26 NOTE — Discharge Summary (Signed)
Physician Discharge Summary  [name] [id number] DOB: 09-06-1935 DOA: 05/23/2015  PCP: [name], FNP  Admit date: 05/23/2015 Discharge date: 05/26/2015  Time spent: 45 minutes  Recommendations for Outpatient Follow-up:  1. 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 Patient was recently evaluated by her primary care physician in regards to chronic and progressive fatigue and dyspnea on exertion over one year. 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 Because of her symptoms her physician checked routine labs and she was pancytopenic with a white count of 1800, hemoglobin 5.9, hematocrit 17.8, platelets 14,000. Neutrophils were 34%, lymphocytes 46%, monocytes 16%, absolute neutrophils 0.7%, peripheral smear revealed schistocytes.  Hospital Course:   High Grade Myelodysplasia/Pancytopenia  -her admission hb was 5.9 with WBC of 1.8 and platelets of 14K -She was seen by Dr.Magrinat in consultation, IR was consulted and she underwent Bone marrow aspiration and biopsy 12/1 -Peripheral smear remarkable for schistocytes and atypical mononuclear cells -Status post 4 units PRBC transfusion and 1 unit platelet throughout admission -Iron studies, B-12, LDH, haptoglobin  unremarkable -Biopsy today with High grade MDS with 15% Blasts, Dr.Magrinat discussed this with patient and family, discussed treatment options, she will FU next week 12/9 and have blood work CBC done at FU too  AKI (acute kidney injury) ([place]) -Likely related to ongoing low perfusion in setting of anemia -Resolved   HTN (hypertension) -Blood pressure currently controlled -Continue Norvasc, Lasix resumed PRN -Last echocardiogram was in 2015 revealed moderate LVH and grade 1 diastolic dysfunction   Depression -Continue Effexor for now   Chronic constipation -secondary to Effexor  Hypokalemia -Replaced  Procedures: Bone marrow aspiration and Biopsy  Consultations:  Dr.Magrinat  Discharge Exam: Filed Vitals:   05/26/15 0456 05/26/15 1105  BP: 155/56 137/59  Pulse: 75 73  Temp: 98.4 F (36.9 C) 98.1 F (36.7 C)  Resp: 16 18    General: AAOx3 Cardiovascular: S1S2/RRR Respiratory: CTAB  Discharge Instructions   Discharge Instructions    Diet - low sodium heart healthy    Complete by:  As directed      Increase activity slowly    Complete by:  As directed           Current Discharge Medication List    CONTINUE these medications which have CHANGED   Details  furosemide (LASIX) 20 MG tablet Take 1 tablet (20 mg total) by mouth daily as needed for fluid or edema. Qty: 15 tablet, Refills: 5      CONTINUE these medications which have NOT CHANGED   Details  amLODipine (NORVASC) 2.5 MG tablet Take 1 tablet (2.5 mg total) by mouth daily. Qty: 90 tablet, Refills: 3    Cyanocobalamin 1000 MCG/ML KIT Inject 1,000 mcg as directed every 30 (thirty) days. B 12  shot    Melatonin 5 MG TABS Take 2.5 mg by mouth at bedtime.    Multiple Vitamin (MULTIVITAMIN WITH MINERALS) TABS tablet Take 1 tablet by mouth daily.    pantoprazole (PROTONIX) 40 MG tablet Take 40 mg by mouth every morning.    Venlafaxine HCl 150 MG TB24 Take 1 tablet (150 mg total) by mouth  daily. Qty: 30 each, Refills: 0    Vitamin D, Ergocalciferol, (DRISDOL) 50000 UNITS CAPS capsule Take 50,000 Units by mouth See admin instructions. Every other week.      STOP taking these medications     aspirin EC 81 MG tablet        Allergies  Allergen Reactions  . Cozaar [Losartan] Other (See Comments)    Cause pt to lose her taste for foods  . Codeine Itching and Other (See Comments)    Makes feel crazy  PT STATES SHE ALSO CAN NOT TAKE THE SYNTHETIC CODEINES  . Hydrocodone Itching    Tolerates with benadryl  . Oxycodone Itching  . Tetanus Toxoids Swelling and Rash   Follow-up Information    Follow up with [name], MD On 06/02/2015.   Specialty:  Oncology   Why:  at [facility] information:   [place] [address] [phone]        The results of significant diagnostics from this hospitalization (including imaging, microbiology, ancillary and laboratory) are listed below for reference.    Significant Diagnostic Studies: Ct Biopsy  05/25/2015  CLINICAL DATA:  79 year old female with pancytopenia EXAM: CT BIOPSY Date: 05/25/2015 PROCEDURE: 1. CT guided bone marrow aspiration and core biopsy Interventional Radiologist:  Criselda Peaches, MD ANESTHESIA/SEDATION: Moderate (conscious) sedation was used. 1.5 mg Versed, 50 mcg Fentanyl were administered intravenously. The patient's vital signs were monitored continuously by radiology nursing throughout the procedure. Sedation Time: 15 minutes FLUOROSCOPY TIME:  None TECHNIQUE: Informed consent was obtained from the patient following explanation of the procedure, risks, benefits and alternatives. The patient understands, agrees and consents for the procedure. All questions were addressed. A time out was performed. The patient was positioned prone and noncontrast localization CT was performed of the pelvis to demonstrate the iliac marrow spaces. Maximal barrier sterile technique utilized including  caps, mask, sterile gowns, sterile gloves, large sterile drape, hand hygiene, and betadine prep. Under sterile conditions and local anesthesia, an 11 gauge coaxial bone biopsy needle was advanced into the right iliac marrow space. Needle position was confirmed with CT imaging. Initially, bone marrow aspiration was performed. Next, the 11 gauge outer cannula was utilized to obtain a right iliac bone marrow core biopsy. A second core biopsy was then obtained for culture. Needle was removed. Hemostasis was obtained with compression. The patient tolerated the procedure well. Samples were prepared with the cytotechnologist. No immediate complications. IMPRESSION: CT guided right iliac bone marrow aspiration and core biopsy. An additional sample was obtained and sent for culture. Signed, Criselda Peaches, MD [facility] Radiology Electronically Signed   By: Jacqulynn Cadet M.D.   On: 05/25/2015 12:37    Microbiology: Recent Results (from the past 240 hour(s))  Tissue culture     Status: None (Preliminary result)   Collection Time: 05/25/15 11:54 AM  Result Value Ref Range Status   Specimen Description TISSUE BONE MARROW  Final   Special Requests NONE  Final   Gram Stain   Final    RARE WBC PRESENT, PREDOMINANTLY PMN NO ORGANISMS SEEN  Performed at [facility]    Culture NO GROWTH Performed at [facility]   Final   Report Status PENDING  Incomplete  Fungus Culture with Smear     Status: None (Preliminary result)   Collection Time: 05/25/15 11:54 AM  Result Value Ref Range Status   Specimen Description TISSUE BONE MARROW  Final   Special Requests NONE  Final   Fungal Smear   Final    NO YEAST OR FUNGAL ELEMENTS SEEN Performed at [facility]    Culture   Final    CULTURE IN PROGRESS FOR FOUR WEEKS Performed at [facility]    Report Status PENDING  Incomplete  AFB culture with smear     Status: None  (Preliminary result)   Collection Time: 05/25/15 11:54 AM  Result Value Ref Range Status   Specimen Description TISSUE BONE MARROW  Final   Special Requests NONE  Final   Acid Fast Smear   Final    NO ACID FAST BACILLI SEEN Performed at [facility]    Culture   Final    CULTURE WILL BE EXAMINED FOR 6 WEEKS BEFORE ISSUING A FINAL REPORT Performed at [facility]    Report Status PENDING  Incomplete     Labs: Basic Metabolic Panel:  Recent Labs Lab 05/23/15 1141 05/24/15 0415 05/25/15 0332  NA 137 138 138  K 3.6 3.1* 4.1  CL 104 103 104  CO2 _0 GLUCOSE 99 123* 96  BUN _1 CREATININE 1.04* 0.99 0.93  CALCIUM 8.9 8.8* 8.8*   Liver Function Tests:  Recent Labs Lab 05/23/15 1141 05/24/15 0415  AST 16 15  ALT 11* 11*  ALKPHOS 54 56  BILITOT 0.7 1.3*  PROT 6.3* 6.3*  ALBUMIN 3.5 3.4*   No results for input(s): LIPASE, AMYLASE in the last 168 hours. No results for input(s): AMMONIA in the last 168 hours. CBC:  Recent Labs Lab 05/23/15 1140 05/23/15 1141 05/23/15 1903 05/24/15 0731 05/25/15 0332 05/26/15 0612  WBC  --  1.7* 2.1* 2.1* 2.3* 2.6*  NEUTROABS 0.7*  --   --   --  0.4*  --   HGB  --  5.4* 6.8* 8.6* 7.7* 10.5*  HCT  --  16.7* 21.0* 26.7* 23.4* 31.6*  MCV  --  100.6* 93.8 89.6 90.7 90.3  PLT 6* 10* 9* 52* 41* 35*   Cardiac Enzymes: No results for input(s): CKTOTAL, CKMB, CKMBINDEX, TROPONINI in the last 168 hours. BNP: BNP (last 3 results) No results for input(s): BNP in the last 8760 hours.  ProBNP (last 3 results)  Recent Labs  05/22/15 1422  PROBNP 341.0*    CBG: No results for input(s): GLUCAP in the last 168 hours.     SignedDomenic Polite  [facility] 05/26/2015, 3:41 PM

## 2015-05-26 NOTE — Progress Notes (Signed)
Met with the  Patient, her husband and daughter [name]. The pathology from her recent bone marriw biopsy is showing a high-risk myelodysplasia--blasts approximately 15%. We discussed the implications of this, the average survival in the 1-2 year rande, the transformation to an acute leukemia, and the treatment options.  I will try to schedule Ms Schnackenberg w Dr Junius Finner at [facility] for a 2d opinion but I think the chances for even a low-intensity transplant are low (mortality likely too high in this age population). We discussed best supportive care/palliative care, and standard care which usually includes azacitidine, as well as blood product support and hematopoietic agents (epo, Nplate)  The patient ahs an appt w me 12/9. We shuld have [place] results by then and if she does carry a 5q- there would be additional treatment options.  You may discharge the pt at your discretion.  Appreciate consuting on this patient  Please let me know if I can be of further help.

## 2015-05-26 NOTE — Progress Notes (Signed)
Patient provided with discharge instruction including information on follow up appointments, new prescriptions, and where to pick up new prescriptions. Pt verbalized understanding of all information. IV was dc'd without complication. VSS. Pt escorted out via wheelchair by this RN.

## 2015-05-26 NOTE — Care Management Important Message (Signed)
Important Message  Patient Details  Name: [name] MRN: [id number] Date of Birth: 04-Jan-1936   Medicare Important Message Given:  Yes    Frederich Montilla P Oakley Kossman 05/26/2015, 2:22 PM

## 2015-05-28 LAB — TISSUE CULTURE: CULTURE: NO GROWTH

## 2015-06-01 ENCOUNTER — Other Ambulatory Visit: Payer: Self-pay

## 2015-06-01 DIAGNOSIS — D61818 Other pancytopenia: Secondary | ICD-10-CM

## 2015-06-01 DIAGNOSIS — D500 Iron deficiency anemia secondary to blood loss (chronic): Secondary | ICD-10-CM

## 2015-06-02 ENCOUNTER — Ambulatory Visit (HOSPITAL_BASED_OUTPATIENT_CLINIC_OR_DEPARTMENT_OTHER): Payer: Medicare Other

## 2015-06-02 ENCOUNTER — Other Ambulatory Visit (HOSPITAL_BASED_OUTPATIENT_CLINIC_OR_DEPARTMENT_OTHER): Payer: Medicare Other

## 2015-06-02 ENCOUNTER — Ambulatory Visit (HOSPITAL_BASED_OUTPATIENT_CLINIC_OR_DEPARTMENT_OTHER): Payer: Medicare Other | Admitting: Oncology

## 2015-06-02 ENCOUNTER — Telehealth: Payer: Self-pay | Admitting: Oncology

## 2015-06-02 ENCOUNTER — Other Ambulatory Visit: Payer: Medicare Other | Admitting: *Deleted

## 2015-06-02 VITALS — BP 138/63 | HR 64 | Temp 97.3°F | Resp 18

## 2015-06-02 VITALS — BP 164/74 | HR 77 | Temp 98.2°F | Resp 20 | Ht 68.0 in | Wt 156.5 lb

## 2015-06-02 DIAGNOSIS — C9200 Acute myeloblastic leukemia, not having achieved remission: Secondary | ICD-10-CM | POA: Insufficient documentation

## 2015-06-02 DIAGNOSIS — D500 Iron deficiency anemia secondary to blood loss (chronic): Secondary | ICD-10-CM

## 2015-06-02 DIAGNOSIS — D61818 Other pancytopenia: Secondary | ICD-10-CM

## 2015-06-02 DIAGNOSIS — D696 Thrombocytopenia, unspecified: Secondary | ICD-10-CM

## 2015-06-02 LAB — CBC WITH DIFFERENTIAL/PLATELET
BASO%: 0.4 % (ref 0.0–2.0)
BASOS ABS: 0 10*3/uL (ref 0.0–0.1)
EOS ABS: 0 10*3/uL (ref 0.0–0.5)
EOS%: 0.8 % (ref 0.0–7.0)
HCT: 29 % — ABNORMAL LOW (ref 34.8–46.6)
HGB: 9.4 g/dL — ABNORMAL LOW (ref 11.6–15.9)
LYMPH#: 1.8 10*3/uL (ref 0.9–3.3)
LYMPH%: 68.5 % — AB (ref 14.0–49.7)
MCH: 29.7 pg (ref 25.1–34.0)
MCHC: 32.4 g/dL (ref 31.5–36.0)
MCV: 91.8 fL (ref 79.5–101.0)
MONO#: 0.3 10*3/uL (ref 0.1–0.9)
MONO%: 12.5 % (ref 0.0–14.0)
NEUT#: 0.5 10*3/uL — CL (ref 1.5–6.5)
NEUT%: 17.8 % — AB (ref 38.4–76.8)
NRBC: 0 % (ref 0–0)
PLATELETS: 3 10*3/uL — AB (ref 145–400)
RBC: 3.16 10*6/uL — AB (ref 3.70–5.45)
RDW: 19.7 % — ABNORMAL HIGH (ref 11.2–14.5)
WBC: 2.6 10*3/uL — ABNORMAL LOW (ref 3.9–10.3)

## 2015-06-02 LAB — CHROMOSOME ANALYSIS, BONE MARROW

## 2015-06-02 LAB — HOLD TUBE, BLOOD BANK

## 2015-06-02 LAB — TECHNOLOGIST REVIEW

## 2015-06-02 MED ORDER — ACETAMINOPHEN 325 MG PO TABS
ORAL_TABLET | ORAL | Status: AC
  Filled 2015-06-02: qty 2

## 2015-06-02 MED ORDER — SODIUM CHLORIDE 0.9 % IV SOLN
250.0000 mL | Freq: Once | INTRAVENOUS | Status: AC
  Administered 2015-06-02: 250 mL via INTRAVENOUS

## 2015-06-02 MED ORDER — ACETAMINOPHEN 325 MG PO TABS
650.0000 mg | ORAL_TABLET | Freq: Once | ORAL | Status: AC
  Administered 2015-06-02: 650 mg via ORAL

## 2015-06-02 MED ORDER — DIPHENHYDRAMINE HCL 25 MG PO CAPS
25.0000 mg | ORAL_CAPSULE | Freq: Once | ORAL | Status: AC
  Administered 2015-06-02: 25 mg via ORAL

## 2015-06-02 MED ORDER — SODIUM CHLORIDE 0.9 % IJ SOLN
10.0000 mL | INTRAMUSCULAR | Status: DC | PRN
  Filled 2015-06-02: qty 10

## 2015-06-02 MED ORDER — DIPHENHYDRAMINE HCL 25 MG PO CAPS
ORAL_CAPSULE | ORAL | Status: AC
  Filled 2015-06-02: qty 1

## 2015-06-02 NOTE — Patient Instructions (Signed)
Platelet Transfusion  A platelet transfusion is a procedure in which you receive donated platelets through an IV tube. Platelets are tiny pieces of blood cells. When a blood vessel is damaged, platelets collect in the damaged area to help form a blood clot. This begins the healing process. If your platelet count gets too low, your blood may have trouble clotting.  You may need a platelet transfusion if you have a condition that causes a low number of platelets (thrombocytopenia). A platelet transfusion may be used to stop or prevent bleeding.  LET YOUR HEALTH CARE PROVIDER KNOW ABOUT:   Any allergies you have.   All medicines you are taking, including vitamins, herbs, eye drops, creams, and over-the-counter medicines.   Previous problems you or members of your family have had with the use of anesthetics.   Any blood disorders you have.   Previous surgeries you have had.   Any medical conditions you may have.   Any reactions you have had during a previous transfusion. RISKS AND COMPLICATIONS Generally, this is a safe procedure. However, problems may occur, including:   Fever with or without chills. The fever usually occurs within the first 4 hours of the transfusion and returns to normal within 48 hours.  Allergic reaction. The reaction is most commonly caused by antibodies your body creates against substances in the transfusion. Signs of an allergic reaction may include itching, hives, difficulty breathing, shock, or low blood pressure.  Sudden (acute) or delayed hemolytic reaction. This rare reaction can occur during the transfusion and up to 28 days after the transfusion. The reaction usually occurs when your body's defense system (immune system) attacks the new platelets. Signs of a hemolytic reaction may include fever, headache, difficulty breathing, low blood pressure, a rapid heartbeat, or pain in your back, abdomen, chest, or IV site.  Transfusion-related acute lung injury  (TRALI). TRALI can occur within hours of a transfusion, or several days later. This is a rare reaction that causes lung damage. The cause is not known.  Infection. Signs of this rare complication may include fever, chills, vomiting, a rapid heartbeat, or low blood pressure. BEFORE THE PROCEDURE   You may have a blood test to determine your blood type. This is necessary to find out what kind ofplatelets best matches your platelets.  If you have had an allergic reaction to a transfusion in the past, you may be given medicine to help prevent a reaction. Take this medicine only as directed by your health care provider.  Your temperature, blood pressure, and pulse will be monitored before the transfusion. PROCEDURE  An IV will be started in your hand or arm.  The transfusion will be attached to your IV tubing. The bag of donated platelets will be attached to your IV tube andgiven into your vein.  Your temperature, blood pressure, and pulse will be monitored regularly during the transfusion. This monitoring is done to help detect early signs of a transfusion reaction.  If you have any signs or symptoms of a reaction, your transfusion will be stopped and you may be given medicine.  When your transfusion is complete, your IV will be removed.  Pressure may be applied to the IV site for a few minutes.  A bandage (dressing) will be applied. The procedure may vary among health care providers and hospitals. AFTER THE PROCEDURE  Your blood pressure, temperature, and pulse will be monitored regularly.   This information is not intended to replace advice given to you by your health   care provider. Make sure you discuss any questions you have with your health care provider.   Document Released: 04/07/2007 Document Revised: 07/01/2014 Document Reviewed: 04/20/2014 Elsevier Interactive Patient Education 2016 [employer]  

## 2015-06-02 NOTE — Progress Notes (Signed)
[place]  [phone] [phone]     ID: [name] DOB: December 08, 1935  MR#: [id number]  HAL#:[id number]  Patient Care Team: [place], FNP as PCP - General (Family Medicine) PCP: [name], FNP, [name] GYN: SU:  OTHER MD: [name], [name]  CHIEF COMPLAINT: Acute myeloid leukemia  CURRENT TREATMENT: Azacitidine   HISTORY OF PRESENT ILLNESS: From the 05/22/2025 consult note:  "The patient was evaluated at her PCP's office 05/22/2015 with a complaint of DOE worsening over several months. Labwork was obtained including a CBC, whioch showed WBC 1.8, platelets 14K, and Hb 5.9 with an MCV of 97.5. DDimer was 1.04. Accordingly the patient was referred to the ED and was admitted 05/23/2015.   Labwork since admission includes a repeat CBC with WBC 1.7, HB 5.4, MCV 100.6 and platelets 10K. Creat was 1.04 with GFR 50. Reticulocytes are not elevated with abs 39.3 (1.7%) and LDH is normal at 188. Other labs are pending.  The patient tells me because of peripheral neuropathy she has been receiving monthly B-12 shots since 2005."  Bone marrow biopsy 05/25/2015 showed acute myeloid leukemia, with 17% blasts by flow cytometry, 24% by aspirate counts, and 20-30% by immunohistochemistry ([id number], and 898). The blasts were positive for CD 117 and focally for MPO. There were also myelodysplasia related changes in all 3 cell lines.  The patient's subsequent history is as detailed below.  INTERVAL HISTORY: [name] was evaluated in heme clinic 06/02/2015 accompanied by her husband, her son [name] and her daughter [name]. Her daughter [name] who lives in [place] participated through [place]. They are here to discuss the results of her bone marrow biopsy last week and to move forward with treatment plans.   REVIEW OF SYSTEMS: She received platelets during her recent hospitalization, which raised her count from 9000-52,000 as of  05/24/2015. They are now back down to 3000. Despite this she is not having any bleeding problems or easy bruising. Her hemoglobin went up to 10.5 after transfusion December 1. It has drifted down to the current 9.4. She denies shortness of breath, palpitations, dizziness, or angina symptoms. In fact "I feel fine". A detailed review of systems today was otherwise stable.  PAST MEDICAL HISTORY: Past Medical History  Diagnosis Date  . GERD (gastroesophageal reflux disease)   . Depression   . Hypertension   . Shortness of breath dyspnea     with exertion  . History of blood transfusion 05/23/2015    "Hgb 5.9"  . Anemia   . Arthritis     "hand joints; hips; back" (05/23/2015)  . Chronic lower back pain   . Anxiety     PAST SURGICAL HISTORY: Past Surgical History  Procedure Laterality Date  . Bone spur      R thigh  . Total shoulder arthroplasty  09/13/2011    Procedure: TOTAL SHOULDER ARTHROPLASTY;  Surgeon: Augustin Schooling, MD;  Location: [facility];  Service: Orthopedics;  Laterality: Left;  LEFT SHOULDER REVERSED TOTAL SHOULDER ARTHROPLASTY  . Total knee arthroplasty  04/27/2012    Procedure: TOTAL KNEE ARTHROPLASTY;  Surgeon: Mauri Pole, MD;  Location: [facility];  Service: Orthopedics;  Laterality: Right;  . Cataract extraction w/ intraocular lens  implant, bilateral  ~ 2005  . Tonsillectomy  ~ 1950  . Esophagogastroduodenoscopy (egd) with propofol N/A 11/08/2014    Procedure: ESOPHAGOGASTRODUODENOSCOPY (EGD) WITH PROPOFOL;  Surgeon: Garlan Fair, MD;  Location: [facility];  Service: Endoscopy;  Laterality: N/A;  .  Joint replacement    . Dilation and curettage of uterus      FAMILY HISTORY Family History  Problem Relation Age of Onset  . Heart attack Mother 44    Died age 39  . Heart disease Brother     Atrial fib  Patient's father died age 34 with prostate cancer; mother died atv70 with an MI. One brother, alive at 58; one sister with parkinson's. No blood or cancer problems  otherwise in family  GYNECOLOGIC HISTORY:  No LMP recorded. Patient is postmenopausal. Menarche age 79, first live birth age 79, she is GXP3; menopause age 59, no HR  SOCIAL HISTORY:  Grade school Pharmacist, hospital, retired; husband was a Higher education careers adviser. He has severe heart disease and she is his main caretaker. Son [name] works for [employer] and sings in the [employer]; daughter [name] lives in [place], a homemaker; daughter [name] works at the surgical center in [employer] as an Therapist, sports. The patient has 7 gch. She is a Psychologist, forensic    ADVANCED DIRECTIVES: in place; the patient's son [name] is her [place]: Social History  Substance Use Topics  . Smoking status: Never Smoker   . Smokeless tobacco: Never Used  . Alcohol Use: No     Colonoscopy:  PAP:  Bone density:  Lipid panel:  Allergies  Allergen Reactions  . Cozaar [Losartan] Other (See Comments)    Cause pt to lose her taste for foods  . Codeine Itching and Other (See Comments)    Makes feel crazy  PT STATES SHE ALSO CAN NOT TAKE THE SYNTHETIC CODEINES  . Hydrocodone Itching    Tolerates with benadryl  . Oxycodone Itching  . Tetanus Toxoids Swelling and Rash    Current Outpatient Prescriptions  Medication Sig Dispense Refill  . amLODipine (NORVASC) 2.5 MG tablet Take 1 tablet (2.5 mg total) by mouth daily. 90 tablet 3  . Cyanocobalamin 1000 MCG/ML KIT Inject 1,000 mcg as directed every 30 (thirty) days. B 12 shot    . furosemide (LASIX) 20 MG tablet Take 1 tablet (20 mg total) by mouth daily as needed for fluid or edema. 15 tablet 5  . Melatonin 5 MG TABS Take 2.5 mg by mouth at bedtime.    . Multiple Vitamin (MULTIVITAMIN WITH MINERALS) TABS tablet Take 1 tablet by mouth daily.    . pantoprazole (PROTONIX) 40 MG tablet Take 40 mg by mouth every morning.    . Venlafaxine HCl 150 MG TB24 Take 1 tablet (150 mg total) by mouth daily. 30 each 0  . Vitamin D, Ergocalciferol, (DRISDOL) 50000 UNITS CAPS capsule Take 50,000  Units by mouth See admin instructions. Every other week.     No current facility-administered medications for this visit.    OBJECTIVE: Older white woman who appears well Filed Vitals:   06/02/15 0852  BP: 164/74  Pulse: 77  Temp: 98.2 F (36.8 C)  Resp: 20     Body mass index is 23.8 kg/(m^2).    ECOG FS:0 - Asymptomatic  Ocular: Sclerae unicteric, pupils equal, round and reactive to light Ear-nose-throat: Oropharynx clear and moist Lymphatic: No cervical or supraclavicular adenopathy Lungs no rales or rhonchi, good excursion bilaterally Heart regular rate and rhythm, no murmur appreciated Abd soft, nontender, positive bowel sounds MSK no focal spinal tenderness, no joint edema Neuro: non-focal, well-oriented, appropriate affect Breasts: Deferred   LAB RESULTS:  CMP     Component Value Date/Time   NA 138 05/25/2015 0332   K  4.1 05/25/2015 0332   CL 104 05/25/2015 0332   CO2 28 05/25/2015 0332   GLUCOSE 96 05/25/2015 0332   BUN 17 05/25/2015 0332   CREATININE 0.93 05/25/2015 0332   CALCIUM 8.8* 05/25/2015 0332   PROT 6.3* 05/24/2015 0415   ALBUMIN 3.4* 05/24/2015 0415   AST 15 05/24/2015 0415   ALT 11* 05/24/2015 0415   ALKPHOS 56 05/24/2015 0415   BILITOT 1.3* 05/24/2015 0415   GFRNONAA 58* 05/25/2015 0332   GFRAA >60 05/25/2015 0332    INo results found for: SPEP, UPEP  Lab Results  Component Value Date   WBC 2.6* 05/26/2015   NEUTROABS 0.4* 05/25/2015   HGB 10.5* 05/26/2015   HCT 31.6* 05/26/2015   MCV 90.3 05/26/2015   PLT 35* 05/26/2015      Chemistry      Component Value Date/Time   NA 138 05/25/2015 0332   K 4.1 05/25/2015 0332   CL 104 05/25/2015 0332   CO2 28 05/25/2015 0332   BUN 17 05/25/2015 0332   CREATININE 0.93 05/25/2015 0332      Component Value Date/Time   CALCIUM 8.8* 05/25/2015 0332   ALKPHOS 56 05/24/2015 0415   AST 15 05/24/2015 0415   ALT 11* 05/24/2015 0415   BILITOT 1.3* 05/24/2015 0415       No results found  for: LABCA2  No components found for: LABCA125  No results for input(s): INR in the last 168 hours.  Urinalysis    Component Value Date/Time   COLORURINE YELLOW 04/21/2012 0946   APPEARANCEUR CLOUDY* 04/21/2012 0946   LABSPEC 1.019 04/21/2012 0946   PHURINE 7.0 04/21/2012 0946   GLUCOSEU NEGATIVE 04/21/2012 0946   HGBUR NEGATIVE 04/21/2012 0946   BILIRUBINUR NEGATIVE 04/21/2012 0946   KETONESUR NEGATIVE 04/21/2012 0946   PROTEINUR NEGATIVE 04/21/2012 0946   UROBILINOGEN 1.0 04/21/2012 0946   NITRITE NEGATIVE 04/21/2012 0946   LEUKOCYTESUR NEGATIVE 04/21/2012 0946    STUDIES: Ct Biopsy  05/25/2015  CLINICAL DATA:  79 year old female with pancytopenia EXAM: CT BIOPSY Date: 05/25/2015 PROCEDURE: 1. CT guided bone marrow aspiration and core biopsy Interventional Radiologist:  Criselda Peaches, MD ANESTHESIA/SEDATION: Moderate (conscious) sedation was used. 1.5 mg Versed, 50 mcg Fentanyl were administered intravenously. The patient's vital signs were monitored continuously by radiology nursing throughout the procedure. Sedation Time: 15 minutes FLUOROSCOPY TIME:  None TECHNIQUE: Informed consent was obtained from the patient following explanation of the procedure, risks, benefits and alternatives. The patient understands, agrees and consents for the procedure. All questions were addressed. A time out was performed. The patient was positioned prone and noncontrast localization CT was performed of the pelvis to demonstrate the iliac marrow spaces. Maximal barrier sterile technique utilized including caps, mask, sterile gowns, sterile gloves, large sterile drape, hand hygiene, and betadine prep. Under sterile conditions and local anesthesia, an 11 gauge coaxial bone biopsy needle was advanced into the right iliac marrow space. Needle position was confirmed with CT imaging. Initially, bone marrow aspiration was performed. Next, the 11 gauge outer cannula was utilized to obtain a right iliac bone  marrow core biopsy. A second core biopsy was then obtained for culture. Needle was removed. Hemostasis was obtained with compression. The patient tolerated the procedure well. Samples were prepared with the cytotechnologist. No immediate complications. IMPRESSION: CT guided right iliac bone marrow aspiration and core biopsy. An additional sample was obtained and sent for culture. Signed, Criselda Peaches, MD [facility] Radiology Electronically Signed   By:  Jacqulynn Cadet M.D.   On: 05/25/2015 12:37    ASSESSMENT: 79 y.o. [place] woman with acute myeloid leukemia diagnosed by bone marrow biopsy 05/25/2015, with the blast count between 17 and 30% depending on the method of determination.   (a) Cytogenetics pending  (b) consider allogenic transplant  (c) all transfusion products to be irradiated  (1) to start sQ azacitidine these 06/05/2015   PLAN:  Since all her children were present we took this opportunity to review Dezaree's situation in detail. They understand the only hope for cure in this situation would be transplantation. We do not do that in [place] and I do not keep up with that literature, therefore I cannot advise them regarding mortality from treatment or success from the various types of transplant available. Instead we have placed a referral to Dr. Nadara Mustard at [facility] and they have an appointment for 06/28/2014. At that time they will discuss that option in detail with further plans to follow as appropriate  In the meantime we reviewed the treatments that we do have available. These include observation alone with supportive care, aggressive chemotherapy as we might try for a younger patient, or "milder" treatments such as is azacitidine or lenalidomide. Because this leukemia is clearly arising from a severely dysplastic marrow, my suggestion was to initiate therapy with is azacitidine. We discussed the possible toxicities, side  effects and complications of this agent in detail.  If the patient is felt to be a candidate for transplantation, generally there is a delay while the patient is further evaluated and a match is found. In that case the azacitidine would serve as interim treatment and hopefully at least reduce the blast count. If the patient is not a transplant candidate then the plan would be to continue azacitidine to maximal response and reassess. She will start her first cycle 06/05/2015  In the meantime of course Ninnie will need intermittent blood support. Today she will receive irradiated platelets. We will follow her counts weekly and transfuse as needed.  The patient and her family are aware of the gravity of her current diagnosis. Because Chad is the primary caregiver for her husband prospective planning will need to be made in case she herself becomes disabled. The patient and her family have a good understanding of all this. They're very motivated to proceed. They know to call for any problems that may develop before her next visit here.  [name], MD   06/02/2015 8:58 AM Medical Oncology and Hematology [facility] [address] [place], [place] [address] Tel. [phone]    Fax. [phone]

## 2015-06-02 NOTE — Telephone Encounter (Signed)
Appointments made and avs printed for patient °

## 2015-06-04 LAB — PREPARE PLATELET PHERESIS: Unit division: 0

## 2015-06-05 ENCOUNTER — Telehealth: Payer: Self-pay | Admitting: Oncology

## 2015-06-05 ENCOUNTER — Other Ambulatory Visit: Payer: Self-pay | Admitting: Family

## 2015-06-05 ENCOUNTER — Ambulatory Visit: Payer: Medicare Other

## 2015-06-05 ENCOUNTER — Ambulatory Visit (HOSPITAL_BASED_OUTPATIENT_CLINIC_OR_DEPARTMENT_OTHER): Payer: Medicare Other

## 2015-06-05 VITALS — BP 157/62 | HR 75 | Temp 97.8°F | Resp 17

## 2015-06-05 DIAGNOSIS — Z5111 Encounter for antineoplastic chemotherapy: Secondary | ICD-10-CM

## 2015-06-05 DIAGNOSIS — C9200 Acute myeloblastic leukemia, not having achieved remission: Secondary | ICD-10-CM | POA: Diagnosis not present

## 2015-06-05 DIAGNOSIS — D61818 Other pancytopenia: Secondary | ICD-10-CM

## 2015-06-05 LAB — TISSUE HYBRIDIZATION (BONE MARROW)-NCBH

## 2015-06-05 MED ORDER — ONDANSETRON HCL 8 MG PO TABS
8.0000 mg | ORAL_TABLET | Freq: Once | ORAL | Status: AC
  Administered 2015-06-05: 8 mg via ORAL

## 2015-06-05 MED ORDER — AZACITIDINE CHEMO SQ INJECTION
75.0000 mg/m2 | Freq: Once | INTRAMUSCULAR | Status: AC
  Administered 2015-06-05: 140 mg via SUBCUTANEOUS
  Filled 2015-06-05: qty 5.6

## 2015-06-05 MED ORDER — ONDANSETRON HCL 8 MG PO TABS
ORAL_TABLET | ORAL | Status: AC
  Filled 2015-06-05: qty 1

## 2015-06-05 NOTE — Progress Notes (Signed)
Per Dr. Jana Hakim, okay to tx with 12/9 labs

## 2015-06-05 NOTE — Addendum Note (Signed)
Addended by: [name] on: 06/05/2015 10:36 AM   Modules accepted: Orders

## 2015-06-05 NOTE — Patient Instructions (Signed)
[name] Discharge Instructions for Patients Receiving Chemotherapy  Today you received the following chemotherapy agents: Vidaza   To help prevent nausea and vomiting after your treatment, we encourage you to take your nausea medication as directed.    If you develop nausea and vomiting that is not controlled by your nausea medication, call the clinic.   BELOW ARE SYMPTOMS THAT SHOULD BE REPORTED IMMEDIATELY:  *FEVER GREATER THAN 100.5 F  *CHILLS WITH OR WITHOUT FEVER  NAUSEA AND VOMITING THAT IS NOT CONTROLLED WITH YOUR NAUSEA MEDICATION  *UNUSUAL SHORTNESS OF BREATH  *UNUSUAL BRUISING OR BLEEDING  TENDERNESS IN MOUTH AND THROAT WITH OR WITHOUT PRESENCE OF ULCERS  *URINARY PROBLEMS  *BOWEL PROBLEMS  UNUSUAL RASH Items with * indicate a potential emergency and should be followed up as soon as possible.  Feel free to call the clinic you have any questions or concerns. The clinic phone number is [phone].  Please show the CHEMO ALERT CARD at check-in to the Emergency Department and triage nurse.   

## 2015-06-05 NOTE — Telephone Encounter (Signed)
Lab added to 12/13 per pof and patient will get a new avs in chemo today  anne

## 2015-06-06 ENCOUNTER — Ambulatory Visit (HOSPITAL_BASED_OUTPATIENT_CLINIC_OR_DEPARTMENT_OTHER): Payer: Medicare Other

## 2015-06-06 ENCOUNTER — Telehealth: Payer: Self-pay | Admitting: Oncology

## 2015-06-06 ENCOUNTER — Other Ambulatory Visit (HOSPITAL_BASED_OUTPATIENT_CLINIC_OR_DEPARTMENT_OTHER): Payer: Medicare Other

## 2015-06-06 ENCOUNTER — Ambulatory Visit (HOSPITAL_COMMUNITY)
Admission: RE | Admit: 2015-06-06 | Discharge: 2015-06-06 | Disposition: A | Discharge status: Home / Self Care | Payer: Medicare Other | Source: Ambulatory Visit | Attending: Oncology | Admitting: Oncology

## 2015-06-06 ENCOUNTER — Other Ambulatory Visit: Payer: Self-pay

## 2015-06-06 ENCOUNTER — Ambulatory Visit: Payer: Medicare Other

## 2015-06-06 VITALS — BP 149/56 | HR 80 | Temp 98.5°F | Resp 18

## 2015-06-06 DIAGNOSIS — C9200 Acute myeloblastic leukemia, not having achieved remission: Secondary | ICD-10-CM | POA: Diagnosis not present

## 2015-06-06 DIAGNOSIS — D500 Iron deficiency anemia secondary to blood loss (chronic): Secondary | ICD-10-CM

## 2015-06-06 DIAGNOSIS — D61818 Other pancytopenia: Secondary | ICD-10-CM

## 2015-06-06 DIAGNOSIS — Z5111 Encounter for antineoplastic chemotherapy: Secondary | ICD-10-CM | POA: Diagnosis not present

## 2015-06-06 DIAGNOSIS — D6489 Other specified anemias: Secondary | ICD-10-CM

## 2015-06-06 LAB — CBC WITH DIFFERENTIAL/PLATELET
BASO%: 0 % (ref 0.0–2.0)
Basophils Absolute: 0 10*3/uL (ref 0.0–0.1)
EOS ABS: 0 10*3/uL (ref 0.0–0.5)
EOS%: 0.7 % (ref 0.0–7.0)
HEMATOCRIT: 28.5 % — AB (ref 34.8–46.6)
HEMOGLOBIN: 9.2 g/dL — AB (ref 11.6–15.9)
LYMPH#: 1.9 10*3/uL (ref 0.9–3.3)
LYMPH%: 64.2 % — AB (ref 14.0–49.7)
MCH: 29.8 pg (ref 25.1–34.0)
MCHC: 32.3 g/dL (ref 31.5–36.0)
MCV: 92.2 fL (ref 79.5–101.0)
MONO#: 0.4 10*3/uL (ref 0.1–0.9)
MONO%: 15.3 % — ABNORMAL HIGH (ref 0.0–14.0)
NEUT#: 0.6 10*3/uL — ABNORMAL LOW (ref 1.5–6.5)
NEUT%: 19.8 % — AB (ref 38.4–76.8)
PLATELETS: 13 10*3/uL — AB (ref 145–400)
RBC: 3.09 10*6/uL — ABNORMAL LOW (ref 3.70–5.45)
RDW: 19.8 % — ABNORMAL HIGH (ref 11.2–14.5)
WBC: 2.9 10*3/uL — ABNORMAL LOW (ref 3.9–10.3)
nRBC: 0 % (ref 0–0)

## 2015-06-06 MED ORDER — AZACITIDINE CHEMO SQ INJECTION
75.0000 mg/m2 | Freq: Once | INTRAMUSCULAR | Status: AC
Start: 2015-06-06 — End: 2015-06-06
  Administered 2015-06-06: 140 mg via SUBCUTANEOUS
  Filled 2015-06-06: qty 5.6

## 2015-06-06 MED ORDER — ONDANSETRON HCL 8 MG PO TABS
8.0000 mg | ORAL_TABLET | Freq: Once | ORAL | Status: AC
  Administered 2015-06-06: 8 mg via ORAL

## 2015-06-06 NOTE — Progress Notes (Signed)
Type and hold done . Instructed to keep blue blood bank bracelet on through Friday of this week. Explained to patient that she will have labs repeated on Thursday, 06/08/15 and that she may get a platelet transfusion on Thursday as well as her vidaza. And perhaps 1 unit of blood on Friday, 06/09/15. Pt voiced understanding.

## 2015-06-06 NOTE — Patient Instructions (Signed)
[facility] Discharge Instructions for Patients Receiving Chemotherapy  Today you received the following chemotherapy agents: Vidaza   To help prevent nausea and vomiting after your treatment, we encourage you to take your nausea medication as directed.    If you develop nausea and vomiting that is not controlled by your nausea medication, call the clinic.   BELOW ARE SYMPTOMS THAT SHOULD BE REPORTED IMMEDIATELY:  *FEVER GREATER THAN 100.5 F  *CHILLS WITH OR WITHOUT FEVER  NAUSEA AND VOMITING THAT IS NOT CONTROLLED WITH YOUR NAUSEA MEDICATION  *UNUSUAL SHORTNESS OF BREATH  *UNUSUAL BRUISING OR BLEEDING  TENDERNESS IN MOUTH AND THROAT WITH OR WITHOUT PRESENCE OF ULCERS  *URINARY PROBLEMS  *BOWEL PROBLEMS  UNUSUAL RASH Items with * indicate a potential emergency and should be followed up as soon as possible.  Feel free to call the clinic you have any questions or concerns. The clinic phone number is [phone].  Please show the CHEMO ALERT CARD at check-in to the Emergency Department and triage nurse.   

## 2015-06-06 NOTE — Telephone Encounter (Signed)
Called and left a message with added lab

## 2015-06-07 ENCOUNTER — Other Ambulatory Visit: Payer: Self-pay | Admitting: *Deleted

## 2015-06-07 ENCOUNTER — Ambulatory Visit (HOSPITAL_BASED_OUTPATIENT_CLINIC_OR_DEPARTMENT_OTHER): Payer: Medicare Other

## 2015-06-07 ENCOUNTER — Other Ambulatory Visit: Payer: Medicare Other

## 2015-06-07 ENCOUNTER — Ambulatory Visit: Payer: Medicare Other

## 2015-06-07 VITALS — BP 152/55 | HR 76 | Temp 98.6°F | Resp 18

## 2015-06-07 DIAGNOSIS — Z5111 Encounter for antineoplastic chemotherapy: Secondary | ICD-10-CM

## 2015-06-07 DIAGNOSIS — D61818 Other pancytopenia: Secondary | ICD-10-CM

## 2015-06-07 DIAGNOSIS — C9200 Acute myeloblastic leukemia, not having achieved remission: Secondary | ICD-10-CM | POA: Diagnosis not present

## 2015-06-07 MED ORDER — ONDANSETRON HCL 8 MG PO TABS
8.0000 mg | ORAL_TABLET | Freq: Once | ORAL | Status: AC
  Administered 2015-06-07: 8 mg via ORAL

## 2015-06-07 MED ORDER — PROCHLORPERAZINE MALEATE 10 MG PO TABS: 10.0000 mg | ORAL_TABLET | Freq: Four times a day (QID) | ORAL | Status: DC | PRN

## 2015-06-07 MED ORDER — ONDANSETRON HCL 8 MG PO TABS
ORAL_TABLET | ORAL | Status: AC
  Filled 2015-06-07: qty 1

## 2015-06-07 MED ORDER — AZACITIDINE CHEMO SQ INJECTION
75.0000 mg/m2 | Freq: Once | INTRAMUSCULAR | Status: AC
  Administered 2015-06-07: 140 mg via SUBCUTANEOUS
  Filled 2015-06-07: qty 5.6

## 2015-06-07 NOTE — Progress Notes (Signed)
Patient reports throwing up last night and is requesting ant-emetic. Val RN called and request reported. [facility] [address].

## 2015-06-07 NOTE — Patient Instructions (Signed)
[facility] Discharge Instructions for Patients Receiving Chemotherapy  Today you received the following chemotherapy agents: Vidaza   To help prevent nausea and vomiting after your treatment, we encourage you to take your nausea medication as directed.    If you develop nausea and vomiting that is not controlled by your nausea medication, call the clinic.   BELOW ARE SYMPTOMS THAT SHOULD BE REPORTED IMMEDIATELY:  *FEVER GREATER THAN 100.5 F  *CHILLS WITH OR WITHOUT FEVER  NAUSEA AND VOMITING THAT IS NOT CONTROLLED WITH YOUR NAUSEA MEDICATION  *UNUSUAL SHORTNESS OF BREATH  *UNUSUAL BRUISING OR BLEEDING  TENDERNESS IN MOUTH AND THROAT WITH OR WITHOUT PRESENCE OF ULCERS  *URINARY PROBLEMS  *BOWEL PROBLEMS  UNUSUAL RASH Items with * indicate a potential emergency and should be followed up as soon as possible.  Feel free to call the clinic you have any questions or concerns. The clinic phone number is [phone].  Please show the CHEMO ALERT CARD at check-in to the Emergency Department and triage nurse.   

## 2015-06-08 ENCOUNTER — Other Ambulatory Visit: Payer: Self-pay | Admitting: *Deleted

## 2015-06-08 ENCOUNTER — Other Ambulatory Visit (HOSPITAL_BASED_OUTPATIENT_CLINIC_OR_DEPARTMENT_OTHER): Payer: Medicare Other

## 2015-06-08 ENCOUNTER — Encounter: Payer: Self-pay | Admitting: Nurse Practitioner

## 2015-06-08 ENCOUNTER — Ambulatory Visit (HOSPITAL_BASED_OUTPATIENT_CLINIC_OR_DEPARTMENT_OTHER): Payer: Medicare Other

## 2015-06-08 ENCOUNTER — Ambulatory Visit (HOSPITAL_BASED_OUTPATIENT_CLINIC_OR_DEPARTMENT_OTHER): Payer: Medicare Other | Admitting: Nurse Practitioner

## 2015-06-08 ENCOUNTER — Other Ambulatory Visit: Payer: Self-pay | Admitting: Nurse Practitioner

## 2015-06-08 VITALS — BP 151/51 | HR 65 | Temp 97.0°F | Resp 18

## 2015-06-08 VITALS — BP 159/56 | HR 68 | Temp 97.3°F | Resp 18 | Wt 156.4 lb

## 2015-06-08 DIAGNOSIS — Z5111 Encounter for antineoplastic chemotherapy: Secondary | ICD-10-CM

## 2015-06-08 DIAGNOSIS — C9200 Acute myeloblastic leukemia, not having achieved remission: Secondary | ICD-10-CM | POA: Diagnosis not present

## 2015-06-08 DIAGNOSIS — K5900 Constipation, unspecified: Secondary | ICD-10-CM

## 2015-06-08 DIAGNOSIS — K5909 Other constipation: Secondary | ICD-10-CM

## 2015-06-08 DIAGNOSIS — D696 Thrombocytopenia, unspecified: Secondary | ICD-10-CM

## 2015-06-08 DIAGNOSIS — D61818 Other pancytopenia: Secondary | ICD-10-CM

## 2015-06-08 LAB — COMPREHENSIVE METABOLIC PANEL
ALT: 19 U/L (ref 0–55)
ANION GAP: 9 meq/L (ref 3–11)
AST: 21 U/L (ref 5–34)
Albumin: 3.5 g/dL (ref 3.5–5.0)
Alkaline Phosphatase: 78 U/L (ref 40–150)
BILIRUBIN TOTAL: 0.98 mg/dL (ref 0.20–1.20)
BUN: 19.5 mg/dL (ref 7.0–26.0)
CALCIUM: 9.4 mg/dL (ref 8.4–10.4)
CHLORIDE: 103 meq/L (ref 98–109)
CO2: 28 mEq/L (ref 22–29)
CREATININE: 1.1 mg/dL (ref 0.6–1.1)
EGFR: 49 mL/min/{1.73_m2} — ABNORMAL LOW (ref >90)
Glucose: 144 mg/dl — ABNORMAL HIGH (ref 70–140)
Potassium: 3.5 mEq/L (ref 3.5–5.1)
SODIUM: 140 meq/L (ref 136–145)
Total Protein: 7.1 g/dL (ref 6.4–8.3)

## 2015-06-08 LAB — CBC WITH DIFFERENTIAL/PLATELET
BASO%: 0 % (ref 0.0–2.0)
BASOS ABS: 0 10*3/uL (ref 0.0–0.1)
EOS%: 1.7 % (ref 0.0–7.0)
Eosinophils Absolute: 0 10*3/uL (ref 0.0–0.5)
HCT: 27.3 % — ABNORMAL LOW (ref 34.8–46.6)
HGB: 8.8 g/dL — ABNORMAL LOW (ref 11.6–15.9)
LYMPH%: 48.9 % (ref 14.0–49.7)
MCH: 29.9 pg (ref 25.1–34.0)
MCHC: 32.2 g/dL (ref 31.5–36.0)
MCV: 92.9 fL (ref 79.5–101.0)
MONO#: 0.2 10*3/uL (ref 0.1–0.9)
MONO%: 9 % (ref 0.0–14.0)
NEUT#: 0.7 10*3/uL — ABNORMAL LOW (ref 1.5–6.5)
NEUT%: 40.4 % (ref 38.4–76.8)
NRBC: 0 % (ref 0–0)
PLATELETS: 9 10*3/uL — AB (ref 145–400)
RBC: 2.94 10*6/uL — AB (ref 3.70–5.45)
RDW: 19.9 % — AB (ref 11.2–14.5)
WBC: 1.8 10*3/uL — ABNORMAL LOW (ref 3.9–10.3)
lymph#: 0.9 10*3/uL (ref 0.9–3.3)

## 2015-06-08 MED ORDER — ONDANSETRON HCL 8 MG PO TABS
8.0000 mg | ORAL_TABLET | Freq: Two times a day (BID) | ORAL | Status: DC | PRN
Start: 2015-06-08 — End: 2015-10-30

## 2015-06-08 MED ORDER — ONDANSETRON HCL 8 MG PO TABS
8.0000 mg | ORAL_TABLET | Freq: Once | ORAL | Status: AC
  Administered 2015-06-08: 8 mg via ORAL

## 2015-06-08 MED ORDER — ACETAMINOPHEN 325 MG PO TABS
ORAL_TABLET | ORAL | Status: AC
  Filled 2015-06-08: qty 2

## 2015-06-08 MED ORDER — DIPHENHYDRAMINE HCL 25 MG PO CAPS
25.0000 mg | ORAL_CAPSULE | Freq: Once | ORAL | Status: AC
  Administered 2015-06-08: 25 mg via ORAL

## 2015-06-08 MED ORDER — AZACITIDINE CHEMO SQ INJECTION
75.0000 mg/m2 | Freq: Once | INTRAMUSCULAR | Status: AC
  Administered 2015-06-08: 140 mg via SUBCUTANEOUS
  Filled 2015-06-08: qty 5.6

## 2015-06-08 MED ORDER — SODIUM CHLORIDE 0.9 % IV SOLN: 250.0000 mL | Freq: Once | INTRAVENOUS | Status: DC

## 2015-06-08 MED ORDER — ACETAMINOPHEN 325 MG PO TABS
650.0000 mg | ORAL_TABLET | Freq: Once | ORAL | Status: AC
  Administered 2015-06-08: 650 mg via ORAL

## 2015-06-08 MED ORDER — DIPHENHYDRAMINE HCL 25 MG PO CAPS
ORAL_CAPSULE | ORAL | Status: AC
  Filled 2015-06-08: qty 1

## 2015-06-08 MED ORDER — ONDANSETRON HCL 8 MG PO TABS
ORAL_TABLET | ORAL | Status: AC
  Filled 2015-06-08: qty 1

## 2015-06-08 NOTE — Patient Instructions (Signed)
[facility] Discharge Instructions for Patients Receiving Chemotherapy  Today you received the following chemotherapy agents: Vidaza  To help prevent nausea and vomiting after your treatment, we encourage you to take your nausea medication as prescribed. If you develop nausea and vomiting that is not controlled by your nausea medication, call the clinic.   BELOW ARE SYMPTOMS THAT SHOULD BE REPORTED IMMEDIATELY:  *FEVER GREATER THAN 100.5 F  *CHILLS WITH OR WITHOUT FEVER  NAUSEA AND VOMITING THAT IS NOT CONTROLLED WITH YOUR NAUSEA MEDICATION  *UNUSUAL SHORTNESS OF BREATH  *UNUSUAL BRUISING OR BLEEDING  TENDERNESS IN MOUTH AND THROAT WITH OR WITHOUT PRESENCE OF ULCERS  *URINARY PROBLEMS  *BOWEL PROBLEMS  UNUSUAL RASH Items with * indicate a potential emergency and should be followed up as soon as possible.  Feel free to call the clinic you have any questions or concerns. The clinic phone number is [phone].  Please show the CHEMO ALERT CARD at check-in to the Emergency Department and triage nurse.  Platelet Transfusion  A platelet transfusion is a procedure in which you receive donated platelets through an IV tube. Platelets are tiny pieces of blood cells. When a blood vessel is damaged, platelets collect in the damaged area to help form a blood clot. This begins the healing process. If your platelet count gets too low, your blood may have trouble clotting.  You may need a platelet transfusion if you have a condition that causes a low number of platelets (thrombocytopenia). A platelet transfusion may be used to stop or prevent bleeding.  LET YOUR HEALTH CARE PROVIDER KNOW ABOUT:  Any allergies you have.  All medicines you are taking, including vitamins, herbs, eye drops, creams, and over-the-counter medicines.  Previous problems you or members of your family have had with the use of anesthetics.  Any blood disorders you have.  Previous surgeries you have  had.  Any medical conditions you may have.  Any reactions you have had during a previous transfusion. RISKS AND COMPLICATIONS Generally, this is a safe procedure. However, problems may occur, including:  Fever with or without chills. The fever usually occurs within the first 4 hours of the transfusion and returns to normal within 48 hours. Allergic reaction. The reaction is most commonly caused by antibodies your body creates against substances in the transfusion. Signs of an allergic reaction may include itching, hives, difficulty breathing, shock, or low blood pressure. Sudden (acute) or delayed hemolytic reaction. This rare reaction can occur during the transfusion and up to 28 days after the transfusion. The reaction usually occurs when your body's defense system (immune system) attacks the new platelets. Signs of a hemolytic reaction may include fever, headache, difficulty breathing, low blood pressure, a rapid heartbeat, or pain in your back, abdomen, chest, or IV site. Transfusion-related acute lung injury (TRALI). TRALI can occur within hours of a transfusion, or several days later. This is a rare reaction that causes lung damage. The cause is not known. Infection. Signs of this rare complication may include fever, chills, vomiting, a rapid heartbeat, or low blood pressure. BEFORE THE PROCEDURE  You may have a blood test to determine your blood type. This is necessary to find out what kind ofplatelets best matches your platelets. If you have had an allergic reaction to a transfusion in the past, you may be given medicine to help prevent a reaction. Take this medicine only as directed by your health care provider. Your temperature, blood pressure, and pulse will be monitored before the   transfusion. PROCEDURE An IV will be started in your hand or arm. The transfusion will be attached to your IV tubing. The bag of donated platelets will be attached to your IV tube andgiven into your  vein. Your temperature, blood pressure, and pulse will be monitored regularly during the transfusion. This monitoring is done to help detect early signs of a transfusion reaction. If you have any signs or symptoms of a reaction, your transfusion will be stopped and you may be given medicine. When your transfusion is complete, your IV will be removed. Pressure may be applied to the IV site for a few minutes. A bandage (dressing) will be applied. The procedure may vary among health care providers and hospitals. AFTER THE PROCEDURE Your blood pressure, temperature, and pulse will be monitored regularly.   This information is not intended to replace advice given to you by your health care provider. Make sure you discuss any questions you have with your health care provider.   Document Released: 04/07/2007 Document Revised: 07/01/2014 Document Reviewed: 04/20/2014 Elsevier Interactive Patient Education 2016 [employer]  

## 2015-06-08 NOTE — Progress Notes (Signed)
[facility]  [phone] [phone]     ID: KAYLYNNE ANDRES DOB: [birth year]  MR#: [id number]  OFB#:[id number]  Patient Care Team: [facility], FNP as PCP - General (Family Medicine) PCP: [name], FNP, [name] GYN: SU:  OTHER MD: [name], [name]  CHIEF COMPLAINT: Acute myeloid leukemia  CURRENT TREATMENT: Azacitidine   HISTORY OF PRESENT ILLNESS: From the 05/22/2025 consult note:  "The patient was evaluated at her PCP's office 05/22/2015 with a complaint of DOE worsening over several months. Labwork was obtained including a CBC, whioch showed WBC 1.8, platelets 14K, and Hb 5.9 with an MCV of 97.5. DDimer was 1.04. Accordingly the patient was referred to the ED and was admitted 05/23/2015.   Labwork since admission includes a repeat CBC with WBC 1.7, HB 5.4, MCV 100.6 and platelets 10K. Creat was 1.04 with GFR 50. Reticulocytes are not elevated with abs 39.3 (1.7%) and LDH is normal at 188. Other labs are pending.  The patient tells me because of peripheral neuropathy she has been receiving monthly B-12 shots since 2005."  Bone marrow biopsy 05/25/2015 showed acute myeloid leukemia, with 17% blasts by flow cytometry, 24% by aspirate counts, and 20-30% by immunohistochemistry ([id number], and [id number]). The blasts were positive for CD 117 and focally for MPO. There were also myelodysplasia related changes in all 3 cell lines.  The patient's subsequent history is as detailed below.  INTERVAL HISTORY: [name] returns today for follow up of her acute myeloid leukemia. She is due for day 4, cycle 1 of azactitidine today. She has tolerated the first 3 doses well so far, with the exception of nausea and dry heaves at home. She has not taken any PRN antiemetic drugs at home, just the zofran given here as a premed.   REVIEW OF SYSTEMS: [name] denies fevers or chills. She is chronically constipated, but has only been taking her  stool softeners once daily. Her appetite is fair secondary to taste changes, but she is eating regularly. She denies shortness of breath, chest pain, cough, or palpitations. She denies pain. She is sleeping well. A detailed review of systems is otherwise stable.  PAST MEDICAL HISTORY: Past Medical History  Diagnosis Date  . GERD (gastroesophageal reflux disease)   . Depression   . Hypertension   . Shortness of breath dyspnea     with exertion  . History of blood transfusion 05/23/2015    "Hgb 5.9"  . Anemia   . Arthritis     "hand joints; hips; back" (05/23/2015)  . Chronic lower back pain   . Anxiety     PAST SURGICAL HISTORY: Past Surgical History  Procedure Laterality Date  . Bone spur      R thigh  . Total shoulder arthroplasty  09/13/2011    Procedure: TOTAL SHOULDER ARTHROPLASTY;  Surgeon: Augustin Schooling, MD;  Location: [facility];  Service: Orthopedics;  Laterality: Left;  LEFT SHOULDER REVERSED TOTAL SHOULDER ARTHROPLASTY  . Total knee arthroplasty  04/27/2012    Procedure: TOTAL KNEE ARTHROPLASTY;  Surgeon: Mauri Pole, MD;  Location: [facility];  Service: Orthopedics;  Laterality: Right;  . Cataract extraction w/ intraocular lens  implant, bilateral  ~ 2005  . Tonsillectomy  ~ 1950  . Esophagogastroduodenoscopy (egd) with propofol N/A 11/08/2014    Procedure: ESOPHAGOGASTRODUODENOSCOPY (EGD) WITH PROPOFOL;  Surgeon: Garlan Fair, MD;  Location: [facility];  Service: Endoscopy;  Laterality: N/A;  . Joint replacement    . Dilation  and curettage of uterus      FAMILY HISTORY Family History  Problem Relation Age of Onset  . Heart attack Mother 21    Died age 44  . Heart disease Brother     Atrial fib  Patient's father died age 26 with prostate cancer; mother died atv24 with an MI. One brother, alive at 43; one sister with parkinson's. No blood or cancer problems otherwise in family  GYNECOLOGIC HISTORY:  No LMP recorded. Patient is postmenopausal. Menarche age 4,  first live birth age 79, she is GXP3; menopause age 79, no HR  SOCIAL HISTORY:  Grade school Pharmacist, hospital, retired; husband was a Higher education careers adviser. He has severe heart disease and she is his main caretaker. Son [name] works for [employer] and sings in the [employer]; daughter Leonard lives in [place], a homemaker; daughter [name] works at the surgical center in [employer] as an Therapist, sports. The patient has 7 gch. She is a Psychologist, forensic    ADVANCED DIRECTIVES: in place; the patient's son [name] is her [place]: Social History  Substance Use Topics  . Smoking status: Never Smoker   . Smokeless tobacco: Never Used  . Alcohol Use: No     Colonoscopy:  PAP:  Bone density:  Lipid panel:  Allergies  Allergen Reactions  . Cozaar [Losartan] Other (See Comments)    Cause pt to lose her taste for foods  . Codeine Itching and Other (See Comments)    Makes feel crazy  PT STATES SHE ALSO CAN NOT TAKE THE SYNTHETIC CODEINES  . Hydrocodone Itching    Tolerates with benadryl  . Oxycodone Itching  . Tetanus Toxoids Swelling and Rash    Current Outpatient Prescriptions  Medication Sig Dispense Refill  . amLODipine (NORVASC) 2.5 MG tablet Take 1 tablet (2.5 mg total) by mouth daily. 90 tablet 3  . Cyanocobalamin 1000 MCG/ML KIT Inject 1,000 mcg as directed every 30 (thirty) days. B 12 shot    . hydrochlorothiazide (HYDRODIURIL) 25 MG tablet     . Melatonin 5 MG TABS Take 2.5 mg by mouth at bedtime.    . Multiple Vitamin (MULTIVITAMIN WITH MINERALS) TABS tablet Take 1 tablet by mouth daily.    . pantoprazole (PROTONIX) 40 MG tablet Take 40 mg by mouth every morning.    . prochlorperazine (COMPAZINE) 10 MG tablet Take 1 tablet (10 mg total) by mouth every 6 (six) hours as needed for nausea or vomiting. 30 tablet 1  . Venlafaxine HCl 150 MG TB24 TAKE ONE TABLET BY MOUTH ONCE DAILY 30 tablet 0  . Vitamin D, Ergocalciferol, (DRISDOL) 50000 UNITS CAPS capsule Take 50,000 Units by mouth See admin  instructions. Every other week.    . furosemide (LASIX) 20 MG tablet Take 1 tablet (20 mg total) by mouth daily as needed for fluid or edema. (Patient not taking: Reported on 06/08/2015) 15 tablet 5  . ondansetron (ZOFRAN) 8 MG tablet Take 1 tablet (8 mg total) by mouth 2 (two) times daily as needed for nausea or vomiting. 30 tablet 1   No current facility-administered medications for this visit.   Facility-Administered Medications Ordered in Other Visits  Medication Dose Route Frequency Provider Last Rate Last Dose  . azaCITIDine (VIDAZA) chemo injection 140 mg  75 mg/m2 (Treatment Plan Actual) Subcutaneous Once [name], MD      . ondansetron [facility]) tablet 8 mg  8 mg Oral Once [name], MD      . sodium chloride  0.9 % injection 10 mL  10 mL Intracatheter PRN [name], MD        OBJECTIVE: Older white woman who appears well, seated in wheelchair Filed Vitals:   06/08/15 1448  BP: 159/56  Pulse: 68  Temp: 97.3 F (36.3 C)  Resp: 18     Body mass index is 23.79 kg/(m^2).    ECOG FS:0 - Asymptomatic  Skin: warm, dry  HEENT: sclerae anicteric, conjunctivae pink, oropharynx clear. No thrush or mucositis.  Lymph Nodes: No cervical or supraclavicular lymphadenopathy  Lungs: clear to auscultation bilaterally, no rales, wheezes, or rhonci  Heart: regular rate and rhythm  Abdomen: round, soft, non tender, positive bowel sounds  Musculoskeletal: No focal spinal tenderness, no peripheral edema  Neuro: non focal, well oriented, positive affect  Breasts: deferred  LAB RESULTS:  CMP     Component Value Date/Time   NA 140 06/08/2015 1419   NA 138 05/25/2015 0332   K 3.5 06/08/2015 1419   K 4.1 05/25/2015 0332   CL 104 05/25/2015 0332   CO2 28 06/08/2015 1419   CO2 28 05/25/2015 0332   GLUCOSE 144* 06/08/2015 1419   GLUCOSE 96 05/25/2015 0332   BUN 19.5 06/08/2015 1419   BUN 17 05/25/2015 0332   CREATININE 1.1 06/08/2015 1419   CREATININE 0.93 05/25/2015  0332   CALCIUM 9.4 06/08/2015 1419   CALCIUM 8.8* 05/25/2015 0332   PROT 7.1 06/08/2015 1419   PROT 6.3* 05/24/2015 0415   ALBUMIN 3.5 06/08/2015 1419   ALBUMIN 3.4* 05/24/2015 0415   AST 21 06/08/2015 1419   AST 15 05/24/2015 0415   ALT 19 06/08/2015 1419   ALT 11* 05/24/2015 0415   ALKPHOS 78 06/08/2015 1419   ALKPHOS 56 05/24/2015 0415   BILITOT 0.98 06/08/2015 1419   BILITOT 1.3* 05/24/2015 0415   GFRNONAA 58* 05/25/2015 0332   GFRAA >60 05/25/2015 0332    INo results found for: SPEP, UPEP  Lab Results  Component Value Date   WBC 1.8* 06/08/2015   NEUTROABS 0.7* 06/08/2015   HGB 8.8* 06/08/2015   HCT 27.3* 06/08/2015   MCV 92.9 06/08/2015   PLT 9* 06/08/2015      Chemistry      Component Value Date/Time   NA 140 06/08/2015 1419   NA 138 05/25/2015 0332   K 3.5 06/08/2015 1419   K 4.1 05/25/2015 0332   CL 104 05/25/2015 0332   CO2 28 06/08/2015 1419   CO2 28 05/25/2015 0332   BUN 19.5 06/08/2015 1419   BUN 17 05/25/2015 0332   CREATININE 1.1 06/08/2015 1419   CREATININE 0.93 05/25/2015 0332      Component Value Date/Time   CALCIUM 9.4 06/08/2015 1419   CALCIUM 8.8* 05/25/2015 0332   ALKPHOS 78 06/08/2015 1419   ALKPHOS 56 05/24/2015 0415   AST 21 06/08/2015 1419   AST 15 05/24/2015 0415   ALT 19 06/08/2015 1419   ALT 11* 05/24/2015 0415   BILITOT 0.98 06/08/2015 1419   BILITOT 1.3* 05/24/2015 0415       No results found for: LABCA2  No components found for: LABCA125  No results for input(s): INR in the last 168 hours.  Urinalysis    Component Value Date/Time   COLORURINE YELLOW 04/21/2012 0946   APPEARANCEUR CLOUDY* 04/21/2012 0946   LABSPEC 1.019 04/21/2012 0946   PHURINE 7.0 04/21/2012 0946   GLUCOSEU NEGATIVE 04/21/2012 0946   HGBUR NEGATIVE 04/21/2012 0946   BILIRUBINUR NEGATIVE 04/21/2012 1610  KETONESUR NEGATIVE 04/21/2012 0946   PROTEINUR NEGATIVE 04/21/2012 0946   UROBILINOGEN 1.0 04/21/2012 0946   NITRITE NEGATIVE 04/21/2012  0946   LEUKOCYTESUR NEGATIVE 04/21/2012 0946    STUDIES: Ct Biopsy  05/25/2015  CLINICAL DATA:  79 year old female with pancytopenia EXAM: CT BIOPSY Date: 05/25/2015 PROCEDURE: 1. CT guided bone marrow aspiration and core biopsy Interventional Radiologist:  Criselda Peaches, MD ANESTHESIA/SEDATION: Moderate (conscious) sedation was used. 1.5 mg Versed, 50 mcg Fentanyl were administered intravenously. The patient's vital signs were monitored continuously by radiology nursing throughout the procedure. Sedation Time: 15 minutes FLUOROSCOPY TIME:  None TECHNIQUE: Informed consent was obtained from the patient following explanation of the procedure, risks, benefits and alternatives. The patient understands, agrees and consents for the procedure. All questions were addressed. A time out was performed. The patient was positioned prone and noncontrast localization CT was performed of the pelvis to demonstrate the iliac marrow spaces. Maximal barrier sterile technique utilized including caps, mask, sterile gowns, sterile gloves, large sterile drape, hand hygiene, and betadine prep. Under sterile conditions and local anesthesia, an 11 gauge coaxial bone biopsy needle was advanced into the right iliac marrow space. Needle position was confirmed with CT imaging. Initially, bone marrow aspiration was performed. Next, the 11 gauge outer cannula was utilized to obtain a right iliac bone marrow core biopsy. A second core biopsy was then obtained for culture. Needle was removed. Hemostasis was obtained with compression. The patient tolerated the procedure well. Samples were prepared with the cytotechnologist. No immediate complications. IMPRESSION: CT guided right iliac bone marrow aspiration and core biopsy. An additional sample was obtained and sent for culture. Signed, Criselda Peaches, MD [facility] Electronically Signed   By: Jacqulynn Cadet M.D.   On:  05/25/2015 12:37    ASSESSMENT: 79 y.o. [place] woman with acute myeloid leukemia diagnosed by bone marrow biopsy 05/25/2015, with the blast count between 17 and 30% depending on the method of determination.   (a) Cytogenetics pending  (b) consider allogenic transplant  (c) all transfusion products to be irradiated  (1) to start sQ azacitidine these 06/05/2015   PLAN:  Peighton is tolerating the azacitidine well for the most part. She is more comfortable with the idea of using zofran at home PRN nausea, given that she has already demonstrates that she tolerates it well as a premed with treatment. She has compazine waiting at the pharmacy, but has concerns about it making her "too loopy." She will pick up zofran today instead.   The labs were reviewed in detail. She is thrombocytopenic with a platelet count of 9 and will receive a platelet transfusion today. She is increasingly anemic with an hgb of 8.8, but she is asymptomatic at this time. Her ANC is up to 0.7. We will continue to monitor these values.  I have asked her to increase her stool softener use to twice daily, and to start using miralax daily.  She will proceed with day 4 of treatment. She will return in 1 week for follow up with Dr. Jana Hakim. She understands and agree with this plan. She has been encouraged to call with any issues that might arise before her next visit here.  Laurie Panda, NP   06/08/2015 3:23 PM

## 2015-06-09 ENCOUNTER — Ambulatory Visit (HOSPITAL_BASED_OUTPATIENT_CLINIC_OR_DEPARTMENT_OTHER): Payer: Medicare Other

## 2015-06-09 ENCOUNTER — Ambulatory Visit: Payer: Medicare Other

## 2015-06-09 ENCOUNTER — Other Ambulatory Visit: Payer: Self-pay | Admitting: Medical Oncology

## 2015-06-09 VITALS — BP 154/59 | HR 70 | Temp 98.7°F | Resp 18

## 2015-06-09 DIAGNOSIS — Z5111 Encounter for antineoplastic chemotherapy: Secondary | ICD-10-CM | POA: Diagnosis not present

## 2015-06-09 DIAGNOSIS — C9200 Acute myeloblastic leukemia, not having achieved remission: Secondary | ICD-10-CM | POA: Diagnosis not present

## 2015-06-09 DIAGNOSIS — D61818 Other pancytopenia: Secondary | ICD-10-CM

## 2015-06-09 LAB — PREPARE PLATELET PHERESIS: Unit division: 0

## 2015-06-09 LAB — PREPARE RBC (CROSSMATCH)

## 2015-06-09 MED ORDER — SODIUM CHLORIDE 0.9 % IV SOLN
250.0000 mL | Freq: Once | INTRAVENOUS | Status: AC
  Administered 2015-06-09: 250 mL via INTRAVENOUS

## 2015-06-09 MED ORDER — AZACITIDINE CHEMO SQ INJECTION
75.0000 mg/m2 | Freq: Once | INTRAMUSCULAR | Status: AC
  Administered 2015-06-09: 140 mg via SUBCUTANEOUS
  Filled 2015-06-09: qty 5.6

## 2015-06-09 MED ORDER — DIPHENHYDRAMINE HCL 50 MG/ML IJ SOLN
12.5000 mg | Freq: Once | INTRAMUSCULAR | Status: DC
Start: 2015-06-09 — End: 2015-06-09

## 2015-06-09 MED ORDER — DIPHENHYDRAMINE HCL 12.5 MG/5ML PO ELIX
12.5000 mg | ORAL_SOLUTION | Freq: Once | ORAL | Status: AC
  Administered 2015-06-09: 12.5 mg via ORAL
  Filled 2015-06-09: qty 5

## 2015-06-09 MED ORDER — ONDANSETRON HCL 8 MG PO TABS
ORAL_TABLET | ORAL | Status: AC
  Filled 2015-06-09: qty 1

## 2015-06-09 MED ORDER — ACETAMINOPHEN 325 MG PO TABS
650.0000 mg | ORAL_TABLET | Freq: Once | ORAL | Status: AC
  Administered 2015-06-09: 650 mg via ORAL

## 2015-06-09 MED ORDER — DIPHENHYDRAMINE HCL 50 MG/ML IJ SOLN
INTRAMUSCULAR | Status: AC
  Filled 2015-06-09: qty 1

## 2015-06-09 MED ORDER — ONDANSETRON HCL 8 MG PO TABS
8.0000 mg | ORAL_TABLET | Freq: Once | ORAL | Status: AC
  Administered 2015-06-09: 8 mg via ORAL

## 2015-06-09 MED ORDER — ACETAMINOPHEN 325 MG PO TABS
ORAL_TABLET | ORAL | Status: AC
  Filled 2015-06-09: qty 2

## 2015-06-09 NOTE — Patient Instructions (Addendum)
[facility] Discharge Instructions for Patients Receiving Chemotherapy  Today you received the following chemotherapy agents vidaza To help prevent nausea and vomiting after your treatment, we encourage you to take your nausea medication as prescribed If you develop nausea and vomiting that is not controlled by your nausea medication, call the clinic.   BELOW ARE SYMPTOMS THAT SHOULD BE REPORTED IMMEDIATELY:  *FEVER GREATER THAN 100.5 F  *CHILLS WITH OR WITHOUT FEVER  NAUSEA AND VOMITING THAT IS NOT CONTROLLED WITH YOUR NAUSEA MEDICATION  *UNUSUAL SHORTNESS OF BREATH  *UNUSUAL BRUISING OR BLEEDING  TENDERNESS IN MOUTH AND THROAT WITH OR WITHOUT PRESENCE OF ULCERS  *URINARY PROBLEMS  *BOWEL PROBLEMS  UNUSUAL RASH Items with * indicate a potential emergency and should be followed up as soon as possible.  Feel free to call the clinic you have any questions or concerns. The clinic phone number is [phone].  Please show the [facility] at check-in to the Emergency Department and triage nurse.  Blood Transfusion  A blood transfusion is a procedure in which you receive donated blood through an IV tube. You may need a blood transfusion because of illness, surgery, or injury. The blood may come from a donor, or it may be your own blood that you donated previously. The blood given in a transfusion is made up of different types of cells. You may receive: Red blood cells. These carry oxygen and replace lost blood. Platelets. These control bleeding. Plasma. Thishelps blood to clot. If you have hemophilia or another clotting disorder, you may also receive other types of blood products. LET [facility] CARE PROVIDER KNOW ABOUT: Any allergies you have. All medicines you are taking, including vitamins, herbs, eye drops, creams, and over-the-counter medicines. Previous problems you or members of your family have had with the use of anesthetics. Any blood disorders  you have. Previous surgeries you have had. Any medical conditions you may have. Any previous reactions you have had during a blood transfusion.  RISKS AND COMPLICATIONS Generally, this is a safe procedure. However, problems may occur, including: Having an allergic reaction to something in the donated blood. Fever. This may be a reaction to the white blood cells in the transfused blood. Iron overload. This can happen from having many transfusions. Transfusion-related acute lung injury (TRALI). This is a rare reaction that causes lung damage. The cause is not known.TRALI can occur within hours of a transfusion or several days later. Sudden (acute) or delayed hemolytic reactions. This happens if your blood does not match the cells in your transfusion. Your body's defense system (immune system) may try to attack the new cells. This complication is rare. Infection. This is rare. BEFORE THE PROCEDURE You may have a blood test to determine your blood type. This is necessary to know what kind of blood your body will accept. If you are going to have a planned surgery, you may donate your own blood. This may be done in case you need to have a transfusion. If you have had an allergic reaction to a transfusion in the past, you may be given medicine to help prevent a reaction. Take this medicine only as directed by your health care provider. You will have your temperature, blood pressure, and pulse monitored before the transfusion. PROCEDURE  An IV will be started in your hand or arm. The bag of donated blood will be attached to your IV tube and given into your vein. Your temperature, blood pressure, and pulse will be monitored  regularly during the transfusion. This monitoring is done to detect early signs of a transfusion reaction. If you have any signs or symptoms of a reaction, your transfusion will be stopped and you may be given medicine. When the transfusion is over, your IV will be  removed. Pressure may be applied to the IV site for a few minutes. A bandage (dressing) will be applied. The procedure may vary among health care providers and hospitals. AFTER THE PROCEDURE Your blood pressure, temperature, and pulse will be monitored regularly.   This information is not intended to replace advice given to you by your health care provider. Make sure you discuss any questions you have with your health care provider.   Document Released: 06/07/2000 Document Revised: 07/01/2014 Document Reviewed: 04/20/2014 Elsevier Interactive Patient Education [employer].

## 2015-06-09 NOTE — Progress Notes (Signed)
Pt completed 1unit prbc today without any issues. VSS stable. AVS printed.

## 2015-06-10 LAB — TYPE AND SCREEN
ABO/RH(D): O POS
ANTIBODY SCREEN: NEGATIVE
Unit division: 0

## 2015-06-12 ENCOUNTER — Ambulatory Visit (HOSPITAL_BASED_OUTPATIENT_CLINIC_OR_DEPARTMENT_OTHER): Payer: Medicare Other

## 2015-06-12 ENCOUNTER — Ambulatory Visit: Payer: Medicare Other

## 2015-06-12 VITALS — BP 156/64 | HR 69 | Temp 98.3°F | Resp 18

## 2015-06-12 DIAGNOSIS — Z5111 Encounter for antineoplastic chemotherapy: Secondary | ICD-10-CM

## 2015-06-12 DIAGNOSIS — C9200 Acute myeloblastic leukemia, not having achieved remission: Secondary | ICD-10-CM | POA: Diagnosis not present

## 2015-06-12 DIAGNOSIS — D61818 Other pancytopenia: Secondary | ICD-10-CM

## 2015-06-12 LAB — CBC WITH DIFFERENTIAL/PLATELET
BASO%: 0 % (ref 0.0–2.0)
Basophils Absolute: 0 10*3/uL (ref 0.0–0.1)
EOS%: 0.5 % (ref 0.0–7.0)
Eosinophils Absolute: 0 10*3/uL (ref 0.0–0.5)
HCT: 25.2 % — ABNORMAL LOW (ref 34.8–46.6)
HGB: 8.3 g/dL — ABNORMAL LOW (ref 11.6–15.9)
LYMPH%: 55.9 % — ABNORMAL HIGH (ref 14.0–49.7)
MCH: 29.9 pg (ref 25.1–34.0)
MCHC: 32.9 g/dL (ref 31.5–36.0)
MCV: 90.6 fL (ref 79.5–101.0)
MONO#: 0.1 10*3/uL (ref 0.1–0.9)
MONO%: 5.9 % (ref 0.0–14.0)
NEUT%: 37.7 % — ABNORMAL LOW (ref 38.4–76.8)
NEUTROS ABS: 0.7 10*3/uL — AB (ref 1.5–6.5)
NRBC: 0 % (ref 0–0)
Platelets: 15 10*3/uL — ABNORMAL LOW (ref 145–400)
RBC: 2.78 10*6/uL — AB (ref 3.70–5.45)
RDW: 18.7 % — AB (ref 11.2–14.5)
WBC: 1.9 10*3/uL — ABNORMAL LOW (ref 3.9–10.3)
lymph#: 1 10*3/uL (ref 0.9–3.3)

## 2015-06-12 LAB — HOLD TUBE, BLOOD BANK

## 2015-06-12 MED ORDER — ONDANSETRON HCL 8 MG PO TABS
8.0000 mg | ORAL_TABLET | Freq: Once | ORAL | Status: AC
  Administered 2015-06-12: 8 mg via ORAL

## 2015-06-12 MED ORDER — ONDANSETRON HCL 8 MG PO TABS
ORAL_TABLET | ORAL | Status: AC
  Filled 2015-06-12: qty 1

## 2015-06-12 MED ORDER — AZACITIDINE CHEMO SQ INJECTION
75.0000 mg/m2 | Freq: Once | INTRAMUSCULAR | Status: AC
  Administered 2015-06-12: 140 mg via SUBCUTANEOUS
  Filled 2015-06-12: qty 5.6

## 2015-06-12 NOTE — Progress Notes (Signed)
Patient reports no BM x6 days. She has chronic constipation and has gone this long and longer in the past without bowel movement. Her ABD is tender, she reports. Bruising noted from vidaza injections. Patient has sennakot at home, which helps her have BM's. She has been instructed to begin this tonight, in addition to miralax once daily per Dr. Jana Hakim. She verbalizes understanding. OK to proceed with treatment, per MD. CBC obtained per MD. Hgb 8.3, plt 15. Ok to d/c home, transfusions not needed at this time. Neutropenic precautions reviewed with patient. She states understanding.

## 2015-06-12 NOTE — Patient Instructions (Signed)
[facility] Discharge Instructions for Patients Receiving Chemotherapy  Begin sennakot and miralax (once daily) to have bowel movement.   Today you received the following chemotherapy agent: Vidaza   To help prevent nausea and vomiting after your treatment, we encourage you to take your nausea medication as prescribed.    If you develop nausea and vomiting that is not controlled by your nausea medication, call the clinic.   BELOW ARE SYMPTOMS THAT SHOULD BE REPORTED IMMEDIATELY:  *FEVER GREATER THAN 100.5 F  *CHILLS WITH OR WITHOUT FEVER  NAUSEA AND VOMITING THAT IS NOT CONTROLLED WITH YOUR NAUSEA MEDICATION  *UNUSUAL SHORTNESS OF BREATH  *UNUSUAL BRUISING OR BLEEDING  TENDERNESS IN MOUTH AND THROAT WITH OR WITHOUT PRESENCE OF ULCERS  *URINARY PROBLEMS  *BOWEL PROBLEMS  UNUSUAL RASH Items with * indicate a potential emergency and should be followed up as soon as possible.  Feel free to call the clinic you have any questions or concerns. The clinic phone number is [phone].  Please show the [facility] at check-in to the Emergency Department and triage nurse.

## 2015-06-13 ENCOUNTER — Ambulatory Visit (HOSPITAL_BASED_OUTPATIENT_CLINIC_OR_DEPARTMENT_OTHER): Payer: Medicare Other

## 2015-06-13 ENCOUNTER — Ambulatory Visit: Payer: Medicare Other

## 2015-06-13 VITALS — BP 158/57 | HR 85 | Temp 98.7°F | Resp 16

## 2015-06-13 DIAGNOSIS — D61818 Other pancytopenia: Secondary | ICD-10-CM

## 2015-06-13 DIAGNOSIS — C9200 Acute myeloblastic leukemia, not having achieved remission: Secondary | ICD-10-CM | POA: Diagnosis not present

## 2015-06-13 DIAGNOSIS — Z5111 Encounter for antineoplastic chemotherapy: Secondary | ICD-10-CM | POA: Diagnosis not present

## 2015-06-13 MED ORDER — ONDANSETRON HCL 8 MG PO TABS
ORAL_TABLET | ORAL | Status: AC
  Filled 2015-06-13: qty 1

## 2015-06-13 MED ORDER — ONDANSETRON HCL 8 MG PO TABS
8.0000 mg | ORAL_TABLET | Freq: Once | ORAL | Status: AC
  Administered 2015-06-13: 8 mg via ORAL

## 2015-06-13 MED ORDER — AZACITIDINE CHEMO SQ INJECTION
75.0000 mg/m2 | Freq: Once | INTRAMUSCULAR | Status: AC
  Administered 2015-06-13: 140 mg via SUBCUTANEOUS
  Filled 2015-06-13: qty 5.6

## 2015-06-13 NOTE — Progress Notes (Signed)
Pt reports that she still has had no BM times one week.  She states that she did take 2 Senokot last night at bed time.  Pt instructed to take additional 2 Senokot tablets when she gets home today from [facility].  Pt states that she has a history of constipation and takes suppositories as needed at home.  Pt instructed to NOT take suppository d/t her low blood counts.  Pt verbalized an understanding.

## 2015-06-13 NOTE — Patient Instructions (Signed)
[facility] Discharge Instructions for Patients Receiving Chemotherapy  Today you received the following chemotherapy agents:  Vidaza  To help prevent nausea and vomiting after your treatment, we encourage you to take your nausea medication as ordered per MD.    If you develop nausea and vomiting that is not controlled by your nausea medication, call the clinic.   BELOW ARE SYMPTOMS THAT SHOULD BE REPORTED IMMEDIATELY:  *FEVER GREATER THAN 100.5 F  *CHILLS WITH OR WITHOUT FEVER  NAUSEA AND VOMITING THAT IS NOT CONTROLLED WITH YOUR NAUSEA MEDICATION  *UNUSUAL SHORTNESS OF BREATH  *UNUSUAL BRUISING OR BLEEDING  TENDERNESS IN MOUTH AND THROAT WITH OR WITHOUT PRESENCE OF ULCERS  *URINARY PROBLEMS  *BOWEL PROBLEMS  UNUSUAL RASH Items with * indicate a potential emergency and should be followed up as soon as possible.  Feel free to call the clinic you have any questions or concerns. The clinic phone number is [phone].  Please show the CHEMO ALERT CARD at check-in to the Emergency Department and triage nurse.   

## 2015-06-16 ENCOUNTER — Ambulatory Visit (HOSPITAL_BASED_OUTPATIENT_CLINIC_OR_DEPARTMENT_OTHER): Payer: Medicare Other | Admitting: Oncology

## 2015-06-16 ENCOUNTER — Other Ambulatory Visit: Payer: Self-pay | Admitting: *Deleted

## 2015-06-16 ENCOUNTER — Ambulatory Visit (HOSPITAL_BASED_OUTPATIENT_CLINIC_OR_DEPARTMENT_OTHER): Payer: Medicare Other

## 2015-06-16 ENCOUNTER — Other Ambulatory Visit (HOSPITAL_BASED_OUTPATIENT_CLINIC_OR_DEPARTMENT_OTHER): Payer: Medicare Other

## 2015-06-16 VITALS — BP 149/66 | HR 83 | Temp 97.7°F | Resp 18 | Ht 68.0 in | Wt 158.1 lb

## 2015-06-16 VITALS — BP 179/66 | HR 71 | Temp 98.0°F | Resp 18

## 2015-06-16 DIAGNOSIS — C9200 Acute myeloblastic leukemia, not having achieved remission: Secondary | ICD-10-CM

## 2015-06-16 DIAGNOSIS — D6489 Other specified anemias: Secondary | ICD-10-CM

## 2015-06-16 DIAGNOSIS — D61818 Other pancytopenia: Secondary | ICD-10-CM

## 2015-06-16 LAB — PREPARE RBC (CROSSMATCH)

## 2015-06-16 LAB — CBC WITH DIFFERENTIAL/PLATELET
BASO%: 0 % (ref 0.0–2.0)
BASOS ABS: 0 10*3/uL (ref 0.0–0.1)
EOS%: 1 % (ref 0.0–7.0)
Eosinophils Absolute: 0 10*3/uL (ref 0.0–0.5)
HEMATOCRIT: 23.5 % — AB (ref 34.8–46.6)
HEMOGLOBIN: 7.8 g/dL — AB (ref 11.6–15.9)
LYMPH%: 79.3 % — AB (ref 14.0–49.7)
MCH: 29.7 pg (ref 25.1–34.0)
MCHC: 33.2 g/dL (ref 31.5–36.0)
MCV: 89.4 fL (ref 79.5–101.0)
MONO#: 0.1 10*3/uL (ref 0.1–0.9)
MONO%: 4.3 % (ref 0.0–14.0)
NEUT%: 15.4 % — ABNORMAL LOW (ref 38.4–76.8)
NEUTROS ABS: 0.3 10*3/uL — AB (ref 1.5–6.5)
NRBC: 0 % (ref 0–0)
PLATELETS: 6 10*3/uL — AB (ref 145–400)
RBC: 2.63 10*6/uL — ABNORMAL LOW (ref 3.70–5.45)
RDW: 18.4 % — AB (ref 11.2–14.5)
WBC: 2.1 10*3/uL — AB (ref 3.9–10.3)
lymph#: 1.7 10*3/uL (ref 0.9–3.3)

## 2015-06-16 LAB — HOLD TUBE, BLOOD BANK

## 2015-06-16 MED ORDER — DIPHENHYDRAMINE HCL 25 MG PO CAPS
25.0000 mg | ORAL_CAPSULE | Freq: Once | ORAL | Status: AC
  Administered 2015-06-16: 25 mg via ORAL

## 2015-06-16 MED ORDER — ACETAMINOPHEN 325 MG PO TABS
650.0000 mg | ORAL_TABLET | Freq: Once | ORAL | Status: AC
  Administered 2015-06-16: 650 mg via ORAL

## 2015-06-16 MED ORDER — TRANEXAMIC ACID 650 MG PO TABS: 1300.0000 mg | ORAL_TABLET | Freq: Every morning | ORAL | Status: DC

## 2015-06-16 MED ORDER — ACETAMINOPHEN 325 MG PO TABS
ORAL_TABLET | ORAL | Status: AC
  Filled 2015-06-16: qty 2

## 2015-06-16 MED ORDER — SODIUM CHLORIDE 0.9 % IV SOLN
250.0000 mL | Freq: Once | INTRAVENOUS | Status: AC
  Administered 2015-06-16: 250 mL via INTRAVENOUS

## 2015-06-16 MED ORDER — DIPHENHYDRAMINE HCL 25 MG PO CAPS
ORAL_CAPSULE | ORAL | Status: AC
  Filled 2015-06-16: qty 1

## 2015-06-16 NOTE — Patient Instructions (Signed)
Platelet Transfusion  A platelet transfusion is a procedure in which you receive donated platelets through an IV tube. Platelets are tiny pieces of blood cells. When a blood vessel is damaged, platelets collect in the damaged area to help form a blood clot. This begins the healing process. If your platelet count gets too low, your blood may have trouble clotting.  You may need a platelet transfusion if you have a condition that causes a low number of platelets (thrombocytopenia). A platelet transfusion may be used to stop or prevent bleeding.  LET YOUR HEALTH CARE PROVIDER KNOW ABOUT:   Any allergies you have.   All medicines you are taking, including vitamins, herbs, eye drops, creams, and over-the-counter medicines.   Previous problems you or members of your family have had with the use of anesthetics.   Any blood disorders you have.   Previous surgeries you have had.   Any medical conditions you may have.   Any reactions you have had during a previous transfusion. RISKS AND COMPLICATIONS Generally, this is a safe procedure. However, problems may occur, including:   Fever with or without chills. The fever usually occurs within the first 4 hours of the transfusion and returns to normal within 48 hours.  Allergic reaction. The reaction is most commonly caused by antibodies your body creates against substances in the transfusion. Signs of an allergic reaction may include itching, hives, difficulty breathing, shock, or low blood pressure.  Sudden (acute) or delayed hemolytic reaction. This rare reaction can occur during the transfusion and up to 28 days after the transfusion. The reaction usually occurs when your body's defense system (immune system) attacks the new platelets. Signs of a hemolytic reaction may include fever, headache, difficulty breathing, low blood pressure, a rapid heartbeat, or pain in your back, abdomen, chest, or IV site.  Transfusion-related acute lung injury  (TRALI). TRALI can occur within hours of a transfusion, or several days later. This is a rare reaction that causes lung damage. The cause is not known.  Infection. Signs of this rare complication may include fever, chills, vomiting, a rapid heartbeat, or low blood pressure. BEFORE THE PROCEDURE   You may have a blood test to determine your blood type. This is necessary to find out what kind ofplatelets best matches your platelets.  If you have had an allergic reaction to a transfusion in the past, you may be given medicine to help prevent a reaction. Take this medicine only as directed by your health care provider.  Your temperature, blood pressure, and pulse will be monitored before the transfusion. PROCEDURE  An IV will be started in your hand or arm.  The transfusion will be attached to your IV tubing. The bag of donated platelets will be attached to your IV tube andgiven into your vein.  Your temperature, blood pressure, and pulse will be monitored regularly during the transfusion. This monitoring is done to help detect early signs of a transfusion reaction.  If you have any signs or symptoms of a reaction, your transfusion will be stopped and you may be given medicine.  When your transfusion is complete, your IV will be removed.  Pressure may be applied to the IV site for a few minutes.  A bandage (dressing) will be applied. The procedure may vary among health care providers and hospitals. AFTER THE PROCEDURE  Your blood pressure, temperature, and pulse will be monitored regularly.   This information is not intended to replace advice given to you by your health   care provider. Make sure you discuss any questions you have with your health care provider.   Document Released: 04/07/2007 Document Revised: 07/01/2014 Document Reviewed: 04/20/2014 Elsevier Interactive Patient Education 2016 [place].    Medication Name: Blood Transfusion  A blood transfusion is a  procedure in which you receive donated blood through an IV tube. You may need a blood transfusion because of illness, surgery, or injury. The blood may come from a donor, or it may be your own blood that you donated previously. The blood given in a transfusion is made up of different types of cells. You may receive:  Red blood cells. These carry oxygen and replace lost blood.  Platelets. These control bleeding.  Plasma. Thishelps blood to clot. If you have hemophilia or another clotting disorder, you may also receive other types of blood products. LET [facility] CARE PROVIDER KNOW ABOUT:  Any allergies you have.  All medicines you are taking, including vitamins, herbs, eye drops, creams, and over-the-counter medicines.  Previous problems you or members of your family have had with the use of anesthetics.  Any blood disorders you have.  Previous surgeries you have had.  Any medical conditions you may have.  Any previous reactions you have had during a blood transfusion.  RISKS AND COMPLICATIONS Generally, this is a safe procedure. However, problems may occur, including:  Having an allergic reaction to something in the donated blood.  Fever. This may be a reaction to the white blood cells in the transfused blood.  Iron overload. This can happen from having many transfusions.  Transfusion-related acute lung injury (TRALI). This is a rare reaction that causes lung damage. The cause is not known.TRALI can occur within hours of a transfusion or several days later.  Sudden (acute) or delayed hemolytic reactions. This happens if your blood does not match the cells in your transfusion. Your body's defense system (immune system) may try to attack the new cells. This complication is rare.  Infection. This is rare. BEFORE THE PROCEDURE  You may have a blood test to determine your blood type. This is necessary to know what kind of blood your body will accept.  If you are going to  have a planned surgery, you may donate your own blood. This may be done in case you need to have a transfusion.  If you have had an allergic reaction to a transfusion in the past, you may be given medicine to help prevent a reaction. Take this medicine only as directed by your health care provider.  You will have your temperature, blood pressure, and pulse monitored before the transfusion. PROCEDURE   An IV will be started in your hand or arm.  The bag of donated blood will be attached to your IV tube and given into your vein.  Your temperature, blood pressure, and pulse will be monitored regularly during the transfusion. This monitoring is done to detect early signs of a transfusion reaction.  If you have any signs or symptoms of a reaction, your transfusion will be stopped and you may be given medicine.  When the transfusion is over, your IV will be removed.  Pressure may be applied to the IV site for a few minutes.  A bandage (dressing) will be applied. The procedure may vary among health care providers and hospitals. AFTER THE PROCEDURE  Your blood pressure, temperature, and pulse will be monitored regularly.   This information is not intended to replace advice given to you by your health care provider.  Make sure you discuss any questions you have with your health care provider.   Document Released: 06/07/2000 Document Revised: 07/01/2014 Document Reviewed: 04/20/2014 Elsevier Interactive Patient Education [employer].

## 2015-06-16 NOTE — Progress Notes (Signed)
Patient reports she did not take her HTN meds today due to an emergency with her husband. She denies any symptoms of HTN at this time, feels well.  She also went home with her PIV in place and patent per Teacher, music.

## 2015-06-16 NOTE — Progress Notes (Signed)
[facility]  [phone] [phone]) [phone]     ID: [name] DOB: 1936-01-20  MR#: [id number]  CBS#:[id number]  Patient Care Team: Golden Circle, FNP as PCP - General (Family Medicine) PCP: [name], FNP, [name] GYN: SU:  OTHER MD: [name], [name], [name]  CHIEF COMPLAINT: Acute myeloid leukemia  CURRENT TREATMENT: Azacitidine, tranexamic acid   HISTORY OF PRESENT ILLNESS: From the 05/22/2025 consult note:  "The patient was evaluated at her PCP's office 05/22/2015 with a complaint of DOE worsening over several months. Labwork was obtained including a CBC, whioch showed WBC 1.8, platelets 14K, and Hb 5.9 with an MCV of 97.5. DDimer was 1.04. Accordingly the patient was referred to the ED and was admitted 05/23/2015.   Labwork since admission includes a repeat CBC with WBC 1.7, HB 5.4, MCV 100.6 and platelets 10K. Creat was 1.04 with GFR 50. Reticulocytes are not elevated with abs 39.3 (1.7%) and LDH is normal at 188. Other labs are pending.  The patient tells me because of peripheral neuropathy she has been receiving monthly B-12 shots since 2005."  Bone marrow biopsy 05/25/2015 showed acute myeloid leukemia, with 17% blasts by flow cytometry, 24% by aspirate counts, and 20-30% by immunohistochemistry ([id number], and [id number]). The blasts were positive for CD 117 and focally for MPO. There were also myelodysplasia related changes in all 3 cell lines.  The patient's subsequent history is as detailed below.  INTERVAL HISTORY: [name] returns today for follow-up of her myelodysplasia/acute myeloid leukemia accompanied by her daughter [name]. Her daughter [name] who lives in [place] participated through [name]. Today is day 12 cycle 1 of a ascites D given days one through 7 of each 28 day cycle   [name]'s husband was admitted today due to [facility] which was sounds like line infection  REVIEW OF  SYSTEMS: Shequila tolerated the is a cytidine with no acute side effects. She did have some soreness from the shots themselves. The evening of day 2 she vomited 1. She has Zofran and Compazine on hand but has not needed them. There has not been any other nausea problems. She also has had no bleeding and no fever. She maintains her usual functional status. A detailed review of systems today was entirely stable.  PAST MEDICAL HISTORY: Past Medical History  Diagnosis Date  . GERD (gastroesophageal reflux disease)   . Depression   . Hypertension   . Shortness of breath dyspnea     with exertion  . History of blood transfusion 05/23/2015    "Hgb 5.9"  . Anemia   . Arthritis     "hand joints; hips; back" (05/23/2015)  . Chronic lower back pain   . Anxiety     PAST SURGICAL HISTORY: Past Surgical History  Procedure Laterality Date  . Bone spur      R thigh  . Total shoulder arthroplasty  09/13/2011    Procedure: TOTAL SHOULDER ARTHROPLASTY;  Surgeon: Augustin Schooling, MD;  Location: [facility];  Service: Orthopedics;  Laterality: Left;  LEFT SHOULDER REVERSED TOTAL SHOULDER ARTHROPLASTY  . Total knee arthroplasty  04/27/2012    Procedure: TOTAL KNEE ARTHROPLASTY;  Surgeon: Mauri Pole, MD;  Location: [facility];  Service: Orthopedics;  Laterality: Right;  . Cataract extraction w/ intraocular lens  implant, bilateral  ~ 2005  . Tonsillectomy  ~ 1950  . Esophagogastroduodenoscopy (egd) with propofol N/A 11/08/2014    Procedure: ESOPHAGOGASTRODUODENOSCOPY (EGD) WITH PROPOFOL;  Surgeon: Ursula Alert  Wynetta Emery, MD;  Location: [facility];  Service: Endoscopy;  Laterality: N/A;  . Joint replacement    . Dilation and curettage of uterus      FAMILY HISTORY Family History  Problem Relation Age of Onset  . Heart attack Mother 40    Died age 45  . Heart disease Brother     Atrial fib  Patient's father died age 32 with prostate cancer; mother died atv95 with an MI. One brother, alive at 42; one sister with  parkinson's. No blood or cancer problems otherwise in family  GYNECOLOGIC HISTORY:  No LMP recorded. Patient is postmenopausal. Menarche age 89, first live birth age 79, she is GXP3; menopause age 48, no HR  SOCIAL HISTORY:  Grade school Pharmacist, hospital, retired; husband was a Higher education careers adviser. He has severe heart disease and she is his main caretaker. Son [name] works for [employer] and sings in the [employer]; daughter Napoleon lives in [place], a homemaker; daughter [name] works at the surgical center in [employer] as an Therapist, sports. The patient has 7 gch. She is a Psychologist, forensic    ADVANCED DIRECTIVES: in place; the patient's son [name] is her [place]: Social History  Substance Use Topics  . Smoking status: Never Smoker   . Smokeless tobacco: Never Used  . Alcohol Use: No     Colonoscopy:  PAP:  Bone density:  Lipid panel:  Allergies  Allergen Reactions  . Cozaar [Losartan] Other (See Comments)    Cause pt to lose her taste for foods  . Codeine Itching and Other (See Comments)    Makes feel crazy  PT STATES SHE ALSO CAN NOT TAKE THE SYNTHETIC CODEINES  . Hydrocodone Itching    Tolerates with benadryl  . Oxycodone Itching  . Tetanus Toxoids Swelling and Rash    Current Outpatient Prescriptions  Medication Sig Dispense Refill  . amLODipine (NORVASC) 2.5 MG tablet Take 1 tablet (2.5 mg total) by mouth daily. 90 tablet 3  . Cyanocobalamin 1000 MCG/ML KIT Inject 1,000 mcg as directed every 30 (thirty) days. B 12 shot    . furosemide (LASIX) 20 MG tablet Take 1 tablet (20 mg total) by mouth daily as needed for fluid or edema. (Patient not taking: Reported on 06/08/2015) 15 tablet 5  . hydrochlorothiazide (HYDRODIURIL) 25 MG tablet     . Melatonin 5 MG TABS Take 2.5 mg by mouth at bedtime.    . Multiple Vitamin (MULTIVITAMIN WITH MINERALS) TABS tablet Take 1 tablet by mouth daily.    . ondansetron (ZOFRAN) 8 MG tablet Take 1 tablet (8 mg total) by mouth 2 (two) times daily as needed for  nausea or vomiting. 30 tablet 1  . pantoprazole (PROTONIX) 40 MG tablet Take 40 mg by mouth every morning.    . prochlorperazine (COMPAZINE) 10 MG tablet Take 1 tablet (10 mg total) by mouth every 6 (six) hours as needed for nausea or vomiting. 30 tablet 1  . tranexamic acid (LYSTEDA) 650 MG TABS tablet Take 2 tablets (1,300 mg total) by mouth every morning. 60 tablet 6  . Venlafaxine HCl 150 MG TB24 TAKE ONE TABLET BY MOUTH ONCE DAILY 30 tablet 0  . Vitamin D, Ergocalciferol, (DRISDOL) 50000 UNITS CAPS capsule Take 50,000 Units by mouth See admin instructions. Every other week.     No current facility-administered medications for this visit.   Facility-Administered Medications Ordered in Other Visits  Medication Dose Route Frequency Provider Last Rate Last Dose  . sodium chloride 0.9 %  injection 10 mL  10 mL Intracatheter PRN [name], MD        OBJECTIVE: Older white woman in no acute distress Filed Vitals:   06/16/15 1339  BP: 149/66  Pulse: 83  Temp: 97.7 F (36.5 C)  Resp: 18     Body mass index is 24.04 kg/(m^2).    ECOG FS:0 - Asymptomatic  Sclerae unicteric, pupils round and equal Oropharynx clear and moist-- no thrush or other lesions No cervical or supraclavicular adenopathy Lungs no rales or rhonchi Heart regular rate and rhythm Abd soft, nontender, positive bowel sounds MSK no focal spinal tenderness, no joint edema or erythema Neuro: nonfocal, well oriented, appropriate affect   LAB RESULTS:  CMP     Component Value Date/Time   NA 140 06/08/2015 1419   NA 138 05/25/2015 0332   K 3.5 06/08/2015 1419   K 4.1 05/25/2015 0332   CL 104 05/25/2015 0332   CO2 28 06/08/2015 1419   CO2 28 05/25/2015 0332   GLUCOSE 144* 06/08/2015 1419   GLUCOSE 96 05/25/2015 0332   BUN 19.5 06/08/2015 1419   BUN 17 05/25/2015 0332   CREATININE 1.1 06/08/2015 1419   CREATININE 0.93 05/25/2015 0332   CALCIUM 9.4 06/08/2015 1419   CALCIUM 8.8* 05/25/2015 0332   PROT 7.1  06/08/2015 1419   PROT 6.3* 05/24/2015 0415   ALBUMIN 3.5 06/08/2015 1419   ALBUMIN 3.4* 05/24/2015 0415   AST 21 06/08/2015 1419   AST 15 05/24/2015 0415   ALT 19 06/08/2015 1419   ALT 11* 05/24/2015 0415   ALKPHOS 78 06/08/2015 1419   ALKPHOS 56 05/24/2015 0415   BILITOT 0.98 06/08/2015 1419   BILITOT 1.3* 05/24/2015 0415   GFRNONAA 58* 05/25/2015 0332   GFRAA >60 05/25/2015 0332    INo results found for: SPEP, UPEP  Lab Results  Component Value Date   WBC 2.1* 06/16/2015   NEUTROABS 0.3* 06/16/2015   HGB 7.8* 06/16/2015   HCT 23.5* 06/16/2015   MCV 89.4 06/16/2015   PLT 6* 06/16/2015      Chemistry      Component Value Date/Time   NA 140 06/08/2015 1419   NA 138 05/25/2015 0332   K 3.5 06/08/2015 1419   K 4.1 05/25/2015 0332   CL 104 05/25/2015 0332   CO2 28 06/08/2015 1419   CO2 28 05/25/2015 0332   BUN 19.5 06/08/2015 1419   BUN 17 05/25/2015 0332   CREATININE 1.1 06/08/2015 1419   CREATININE 0.93 05/25/2015 0332      Component Value Date/Time   CALCIUM 9.4 06/08/2015 1419   CALCIUM 8.8* 05/25/2015 0332   ALKPHOS 78 06/08/2015 1419   ALKPHOS 56 05/24/2015 0415   AST 21 06/08/2015 1419   AST 15 05/24/2015 0415   ALT 19 06/08/2015 1419   ALT 11* 05/24/2015 0415   BILITOT 0.98 06/08/2015 1419   BILITOT 1.3* 05/24/2015 0415       No results found for: LABCA2  No components found for: LABCA125  No results for input(s): INR in the last 168 hours.  Urinalysis    Component Value Date/Time   COLORURINE YELLOW 04/21/2012 0946   APPEARANCEUR CLOUDY* 04/21/2012 0946   LABSPEC 1.019 04/21/2012 0946   PHURINE 7.0 04/21/2012 0946   GLUCOSEU NEGATIVE 04/21/2012 0946   HGBUR NEGATIVE 04/21/2012 0946   BILIRUBINUR NEGATIVE 04/21/2012 0946   KETONESUR NEGATIVE 04/21/2012 0946   PROTEINUR NEGATIVE 04/21/2012 0946   UROBILINOGEN 1.0 04/21/2012 0946   NITRITE NEGATIVE  04/21/2012 0946   LEUKOCYTESUR NEGATIVE 04/21/2012 0946    STUDIES: Ct  Biopsy  05/25/2015  CLINICAL DATA:  79 year old female with pancytopenia EXAM: CT BIOPSY Date: 05/25/2015 PROCEDURE: 1. CT guided bone marrow aspiration and core biopsy Interventional Radiologist:  Criselda Peaches, MD ANESTHESIA/SEDATION: Moderate (conscious) sedation was used. 1.5 mg Versed, 50 mcg Fentanyl were administered intravenously. The patient's vital signs were monitored continuously by radiology nursing throughout the procedure. Sedation Time: 15 minutes FLUOROSCOPY TIME:  None TECHNIQUE: Informed consent was obtained from the patient following explanation of the procedure, risks, benefits and alternatives. The patient understands, agrees and consents for the procedure. All questions were addressed. A time out was performed. The patient was positioned prone and noncontrast localization CT was performed of the pelvis to demonstrate the iliac marrow spaces. Maximal barrier sterile technique utilized including caps, mask, sterile gowns, sterile gloves, large sterile drape, hand hygiene, and betadine prep. Under sterile conditions and local anesthesia, an 11 gauge coaxial bone biopsy needle was advanced into the right iliac marrow space. Needle position was confirmed with CT imaging. Initially, bone marrow aspiration was performed. Next, the 11 gauge outer cannula was utilized to obtain a right iliac bone marrow core biopsy. A second core biopsy was then obtained for culture. Needle was removed. Hemostasis was obtained with compression. The patient tolerated the procedure well. Samples were prepared with the cytotechnologist. No immediate complications. IMPRESSION: CT guided right iliac bone marrow aspiration and core biopsy. An additional sample was obtained and sent for culture. Signed, Criselda Peaches, MD [facility] Radiology Electronically Signed   By: Jacqulynn Cadet M.D.   On: 05/25/2015 12:37    ASSESSMENT: 79 y.o. [place] woman with  acute myeloid leukemia diagnosed by bone marrow biopsy 05/25/2015, with the blast count between 17 and 30% depending on the method of determination.   (a) Cytogenetics pending  (b) consider allogenic transplant  (c) all transfusion products to be irradiated  (1) started sQ azacitidine 06/05/2015, receiving 7 doses every 28 day cycle   PLAN:  Cheria tolerated her first cycle of is a cytidine essentially without event. She has antinausea medication on hand but really has not needed it after that single episode of vomiting on day 2.  Of course we are not seeing any significant response at this time. I am proceeding with transfusion of 2 units of packed red cells and platelets. However we seem to be getting less of a platelet bump so I am going to start her on tranexamic acid, just 1300 mg daily on a preventative basis.  We will check her counts again the first week in January and transfuse as needed. She will then start her second cycle of is aside to [employer] on January 9.  She is going to be meeting with Dr. Nadara Mustard January 5 for a second opinion and the main question there are of course is whether Dr. Nadara Mustard thinks she may at some point be a transplant candidate despite her age. Of course we will be interested in any other treatment suggestions Dr. Nadara Mustard may have  Jailee knows to call for any problems that may develop before her next visit here.    [name], MD   06/16/2015 2:25 PM Medical Oncology and Hematology [facility] [address] [place], [place] [address] Tel. [phone]    Fax. [phone]

## 2015-06-17 ENCOUNTER — Ambulatory Visit (HOSPITAL_BASED_OUTPATIENT_CLINIC_OR_DEPARTMENT_OTHER): Payer: Medicare Other

## 2015-06-17 VITALS — BP 148/44 | HR 60 | Temp 97.4°F | Resp 18

## 2015-06-17 DIAGNOSIS — C9200 Acute myeloblastic leukemia, not having achieved remission: Secondary | ICD-10-CM | POA: Diagnosis not present

## 2015-06-17 DIAGNOSIS — D6489 Other specified anemias: Secondary | ICD-10-CM | POA: Diagnosis not present

## 2015-06-17 LAB — PREPARE PLATELET PHERESIS: UNIT DIVISION: 0

## 2015-06-17 MED ORDER — SODIUM CHLORIDE 0.9 % IV SOLN
INTRAVENOUS | Status: DC
  Administered 2015-06-17: 09:00:00 via INTRAVENOUS

## 2015-06-17 MED ORDER — ACETAMINOPHEN 325 MG PO TABS
650.0000 mg | ORAL_TABLET | Freq: Once | ORAL | Status: AC
  Administered 2015-06-17: 650 mg via ORAL

## 2015-06-17 MED ORDER — HEPARIN SOD (PORK) LOCK FLUSH 100 UNIT/ML IV SOLN
500.0000 [IU] | Freq: Every day | INTRAVENOUS | Status: DC | PRN
  Filled 2015-06-17: qty 5

## 2015-06-17 MED ORDER — DIPHENHYDRAMINE HCL 25 MG PO CAPS
25.0000 mg | ORAL_CAPSULE | Freq: Once | ORAL | Status: AC
  Administered 2015-06-17: 25 mg via ORAL

## 2015-06-17 MED ORDER — SODIUM CHLORIDE 0.9 % IJ SOLN
10.0000 mL | INTRAMUSCULAR | Status: DC | PRN
  Filled 2015-06-17: qty 10

## 2015-06-17 MED ORDER — DIPHENHYDRAMINE HCL 25 MG PO CAPS
ORAL_CAPSULE | ORAL | Status: AC
Start: 2015-06-17 — End: 2015-06-17
  Filled 2015-06-17: qty 1

## 2015-06-17 MED ORDER — ACETAMINOPHEN 325 MG PO TABS
ORAL_TABLET | ORAL | Status: AC
  Filled 2015-06-17: qty 2

## 2015-06-17 NOTE — Patient Instructions (Signed)
Blood Transfusion   A blood transfusion is a procedure that gives you donated blood through an IV tube. You may need blood because of illness, surgery, or injury. The blood may come from a donor. The blood may also be your own blood that you donated earlier.  The blood you get is made up of different types of cells. You may get:    Red blood cells. These carry oxygen and replace lost blood.    Platelets. These control bleeding.    Plasma. This helps blood to clot.  If you have a clotting disorder, you may also get other types of blood products.   BEFORE THE PROCEDURE   You may have a blood test. This finds out what type of blood you have. It also finds out what kind of blood your body will accept.    If you are going to have a planned surgery, you may donate your own blood. This is done in case you need to have a transfusion.    If you have had an allergic transfusion reaction before, you may be given medicine to help prevent a reaction. Take this medicine only as told by your doctor.   You will have your temperature, blood pressure, and pulse checked.  PROCEDURE    An IV will be started in your hand or arm.    The bag of donated blood will be attached to your IV and run into your vein.    A doctor will regularly check your temperature, blood pressure, and pulse during the procedure. This is done to find any early signs of a transfusion reaction.   If you have any signs or symptoms of a reaction, the procedure may be stopped and you may be given medicine.    When the transfusion is over, your IV will be removed.    Pressure may be applied to the IV site for a few minutes.    A bandage (dressing) will be applied.   The procedure may vary among doctors and hospitals.   AFTER THE PROCEDURE   Your blood pressure, temperature, and pulse will be checked regularly.     This information is not intended to replace advice given to you by your health care provider. Make sure you discuss any questions  you have with your health care provider.     Document Released: 09/06/2008 Document Revised: 07/01/2014 Document Reviewed: 04/20/2014  Elsevier Interactive Patient Education 2016 Elsevier Inc[employer]

## 2015-06-18 LAB — TYPE AND SCREEN
ABO/RH(D): O POS
ANTIBODY SCREEN: NEGATIVE
UNIT DIVISION: 0
UNIT DIVISION: 0

## 2015-06-20 ENCOUNTER — Telehealth: Payer: Self-pay | Admitting: Oncology

## 2015-06-20 LAB — FUNGUS CULTURE W SMEAR: Fungal Smear: NONE SEEN

## 2015-06-20 NOTE — Telephone Encounter (Signed)
Left message for patient re appointment for 1/3 and mailed schedule.

## 2015-06-26 ENCOUNTER — Other Ambulatory Visit: Payer: Self-pay | Admitting: Oncology

## 2015-06-26 NOTE — Progress Notes (Unsigned)
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 The recommended starting dose of lenalidomide is 10 mg daily for 21 days every month, but frequently requires adjustment based on toxicities (especially cytopenias). With this approach, approximately two-thirds of patients experience a reduction in red blood cell transfusion need; responses are usually seen within four months and may last for one year or longer. Most patients will experience neutropenia or thrombocytopenia, but it is of interest that such myelosuppression may be associated with a better chance of response to this agent "  However she is clinically "high-risk"-- she will see Dr Nadara Mustard Jan 5 and [place] Jan 9. We can make any changes suggested to her treatment plan at that time

## 2015-06-27 ENCOUNTER — Other Ambulatory Visit: Payer: Self-pay | Admitting: *Deleted

## 2015-06-27 ENCOUNTER — Telehealth: Payer: Self-pay | Admitting: *Deleted

## 2015-06-27 ENCOUNTER — Ambulatory Visit (HOSPITAL_BASED_OUTPATIENT_CLINIC_OR_DEPARTMENT_OTHER): Payer: Medicare Other

## 2015-06-27 ENCOUNTER — Other Ambulatory Visit (HOSPITAL_BASED_OUTPATIENT_CLINIC_OR_DEPARTMENT_OTHER): Payer: Medicare Other

## 2015-06-27 ENCOUNTER — Ambulatory Visit (HOSPITAL_COMMUNITY)
Admission: RE | Admit: 2015-06-27 | Discharge: 2015-06-27 | Disposition: A | Discharge status: Home / Self Care | Payer: Medicare Other | Source: Ambulatory Visit | Attending: Oncology | Admitting: Oncology

## 2015-06-27 ENCOUNTER — Telehealth: Payer: Self-pay | Admitting: Oncology

## 2015-06-27 VITALS — BP 143/52 | HR 67 | Temp 97.6°F | Resp 18

## 2015-06-27 DIAGNOSIS — C9200 Acute myeloblastic leukemia, not having achieved remission: Secondary | ICD-10-CM | POA: Diagnosis present

## 2015-06-27 DIAGNOSIS — D61818 Other pancytopenia: Secondary | ICD-10-CM

## 2015-06-27 DIAGNOSIS — D649 Anemia, unspecified: Secondary | ICD-10-CM | POA: Insufficient documentation

## 2015-06-27 DIAGNOSIS — D6489 Other specified anemias: Secondary | ICD-10-CM

## 2015-06-27 LAB — CBC WITH DIFFERENTIAL/PLATELET
BASO%: 0.8 % (ref 0.0–2.0)
Basophils Absolute: 0 10*3/uL (ref 0.0–0.1)
EOS ABS: 0 10*3/uL (ref 0.0–0.5)
EOS%: 1.1 % (ref 0.0–7.0)
HCT: 25.7 % — ABNORMAL LOW (ref 34.8–46.6)
HEMOGLOBIN: 8.6 g/dL — AB (ref 11.6–15.9)
LYMPH%: 76.6 % — ABNORMAL HIGH (ref 14.0–49.7)
MCH: 30.1 pg (ref 25.1–34.0)
MCHC: 33.5 g/dL (ref 31.5–36.0)
MCV: 90 fL (ref 79.5–101.0)
MONO#: 0 10*3/uL — AB (ref 0.1–0.9)
MONO%: 1.4 % (ref 0.0–14.0)
NEUT%: 20.1 % — ABNORMAL LOW (ref 38.4–76.8)
NEUTROS ABS: 0.2 10*3/uL — AB (ref 1.5–6.5)
PLATELETS: 6 10*3/uL — AB (ref 145–400)
RBC: 2.86 10*6/uL — ABNORMAL LOW (ref 3.70–5.45)
RDW: 17.6 % — AB (ref 11.2–14.5)
WBC: 1.1 10*3/uL — AB (ref 3.9–10.3)
lymph#: 0.8 10*3/uL — ABNORMAL LOW (ref 0.9–3.3)

## 2015-06-27 MED ORDER — DIPHENHYDRAMINE HCL 25 MG PO CAPS
25.0000 mg | ORAL_CAPSULE | Freq: Once | ORAL | Status: AC
  Administered 2015-06-27: 25 mg via ORAL

## 2015-06-27 MED ORDER — MAGIC MOUTHWASH: ORAL | Status: DC

## 2015-06-27 MED ORDER — ACETAMINOPHEN 325 MG PO TABS
650.0000 mg | ORAL_TABLET | Freq: Once | ORAL | Status: AC
  Administered 2015-06-27: 650 mg via ORAL

## 2015-06-27 MED ORDER — SODIUM CHLORIDE 0.9 % IV SOLN
250.0000 mL | Freq: Once | INTRAVENOUS | Status: AC
  Administered 2015-06-27: 250 mL via INTRAVENOUS

## 2015-06-27 MED ORDER — DIPHENHYDRAMINE HCL 25 MG PO CAPS
ORAL_CAPSULE | ORAL | Status: AC
  Filled 2015-06-27: qty 1

## 2015-06-27 MED ORDER — ACETAMINOPHEN 325 MG PO TABS
ORAL_TABLET | ORAL | Status: AC
  Filled 2015-06-27: qty 2

## 2015-06-27 NOTE — Telephone Encounter (Signed)
Per staff message and POF I have scheduled appts. Advised scheduler of appts. JMW  

## 2015-06-27 NOTE — Telephone Encounter (Signed)
per pof tos ch pt appt-sent MW email to sch pt trmt-will call pt after reply °

## 2015-06-27 NOTE — Progress Notes (Signed)
Pt reports open mouth sores, and fever blisters and bruising on right side of mouth/cheek that is painful to the point her oral intake has decreased. Pt states she did not fall or hit her face on anything. Pt states that pain is from right side of mouth radiating to right ear and that she has been experiencing headaches.  Val RN (Dr. Virgie Dad nurse) aware and will inform Dr. Jana Hakim.

## 2015-06-27 NOTE — Telephone Encounter (Signed)
Post lab with platelet count of 6- this RN spoke with pt in lobby.  Noted on right side of mouth - bruising and fever blister which also extended on inside of lip and gum.  Pt states she has " specks all over my body ".  No bleeding per gums and no issues with urination.  [name] states above started late last week " after starting those 2 pills in the morning from Dr Jana Hakim"  Pt has valtrex prescription she uses per dermatology but is not presently taking it.  This RN offered for pt to be seen by Selena Lesser NP in [facility]. [name] declined stating " oh I do not want to see her I would rather see Dr Jana Hakim - I only have a few problems "  This RN informed pt MD presently unable to see her due to current schedule.   Per review with MD of above concerns including lab values obtained orders for platelet transfusion- Pt is to stop the tranexamic acid and start valtrex. Prescription given for magic mouthwash.  Pt will return to this office at 3 pm for platelet transfusion and RN will inform MD of pt's location.

## 2015-06-27 NOTE — Patient Instructions (Signed)
Platelet Transfusion  A platelet transfusion is a procedure in which you receive donated platelets through an IV tube. Platelets are tiny pieces of blood cells. When a blood vessel is damaged, platelets collect in the damaged area to help form a blood clot. This begins the healing process. If your platelet count gets too low, your blood may have trouble clotting.  You may need a platelet transfusion if you have a condition that causes a low number of platelets (thrombocytopenia). A platelet transfusion may be used to stop or prevent bleeding.  LET YOUR HEALTH CARE PROVIDER KNOW ABOUT:   Any allergies you have.   All medicines you are taking, including vitamins, herbs, eye drops, creams, and over-the-counter medicines.   Previous problems you or members of your family have had with the use of anesthetics.   Any blood disorders you have.   Previous surgeries you have had.   Any medical conditions you may have.   Any reactions you have had during a previous transfusion. RISKS AND COMPLICATIONS Generally, this is a safe procedure. However, problems may occur, including:   Fever with or without chills. The fever usually occurs within the first 4 hours of the transfusion and returns to normal within 48 hours.  Allergic reaction. The reaction is most commonly caused by antibodies your body creates against substances in the transfusion. Signs of an allergic reaction may include itching, hives, difficulty breathing, shock, or low blood pressure.  Sudden (acute) or delayed hemolytic reaction. This rare reaction can occur during the transfusion and up to 28 days after the transfusion. The reaction usually occurs when your body's defense system (immune system) attacks the new platelets. Signs of a hemolytic reaction may include fever, headache, difficulty breathing, low blood pressure, a rapid heartbeat, or pain in your back, abdomen, chest, or IV site.  Transfusion-related acute lung injury  (TRALI). TRALI can occur within hours of a transfusion, or several days later. This is a rare reaction that causes lung damage. The cause is not known.  Infection. Signs of this rare complication may include fever, chills, vomiting, a rapid heartbeat, or low blood pressure. BEFORE THE PROCEDURE   You may have a blood test to determine your blood type. This is necessary to find out what kind ofplatelets best matches your platelets.  If you have had an allergic reaction to a transfusion in the past, you may be given medicine to help prevent a reaction. Take this medicine only as directed by your health care provider.  Your temperature, blood pressure, and pulse will be monitored before the transfusion. PROCEDURE  An IV will be started in your hand or arm.  The transfusion will be attached to your IV tubing. The bag of donated platelets will be attached to your IV tube andgiven into your vein.  Your temperature, blood pressure, and pulse will be monitored regularly during the transfusion. This monitoring is done to help detect early signs of a transfusion reaction.  If you have any signs or symptoms of a reaction, your transfusion will be stopped and you may be given medicine.  When your transfusion is complete, your IV will be removed.  Pressure may be applied to the IV site for a few minutes.  A bandage (dressing) will be applied. The procedure may vary among health care providers and hospitals. AFTER THE PROCEDURE  Your blood pressure, temperature, and pulse will be monitored regularly.   This information is not intended to replace advice given to you by your health   care provider. Make sure you discuss any questions you have with your health care provider.   Document Released: 04/07/2007 Document Revised: 07/01/2014 Document Reviewed: 04/20/2014 Elsevier Interactive Patient Education 2016 [employer]  

## 2015-06-28 ENCOUNTER — Other Ambulatory Visit: Payer: Medicare Other

## 2015-06-28 ENCOUNTER — Telehealth: Payer: Self-pay | Admitting: Oncology

## 2015-06-28 ENCOUNTER — Encounter: Payer: Self-pay | Admitting: Oncology

## 2015-06-28 LAB — PREPARE PLATELET PHERESIS: Unit division: 0

## 2015-06-28 NOTE — Telephone Encounter (Signed)
per pof  to sch blood tran-cld & spoke to pt and gave pt time & date of appt

## 2015-06-29 ENCOUNTER — Telehealth: Payer: Self-pay | Admitting: *Deleted

## 2015-06-29 DIAGNOSIS — C9200 Acute myeloblastic leukemia, not having achieved remission: Secondary | ICD-10-CM

## 2015-06-29 MED ORDER — MAGIC MOUTHWASH: ORAL | Status: DC

## 2015-06-29 NOTE — Telephone Encounter (Signed)
"  Patient came last night to pick up MMW,  We do not have an order."  Unable to send order [place]. to [employer].  Order left on sam's voicemail.

## 2015-06-30 ENCOUNTER — Ambulatory Visit (HOSPITAL_BASED_OUTPATIENT_CLINIC_OR_DEPARTMENT_OTHER): Payer: Medicare Other

## 2015-06-30 ENCOUNTER — Other Ambulatory Visit (HOSPITAL_BASED_OUTPATIENT_CLINIC_OR_DEPARTMENT_OTHER): Payer: Medicare Other

## 2015-06-30 ENCOUNTER — Other Ambulatory Visit: Payer: Self-pay | Admitting: *Deleted

## 2015-06-30 VITALS — BP 166/59 | HR 65 | Temp 97.1°F | Resp 18

## 2015-06-30 DIAGNOSIS — D649 Anemia, unspecified: Secondary | ICD-10-CM

## 2015-06-30 DIAGNOSIS — D61818 Other pancytopenia: Secondary | ICD-10-CM

## 2015-06-30 DIAGNOSIS — C9200 Acute myeloblastic leukemia, not having achieved remission: Secondary | ICD-10-CM

## 2015-06-30 LAB — CBC WITH DIFFERENTIAL/PLATELET
BASO%: 0 % (ref 0.0–2.0)
BASOS ABS: 0 10*3/uL (ref 0.0–0.1)
EOS ABS: 0 10*3/uL (ref 0.0–0.5)
EOS%: 0 % (ref 0.0–7.0)
HCT: 21.9 % — ABNORMAL LOW (ref 34.8–46.6)
HEMOGLOBIN: 7.4 g/dL — AB (ref 11.6–15.9)
LYMPH#: 0.8 10*3/uL — AB (ref 0.9–3.3)
LYMPH%: 82.4 % — ABNORMAL HIGH (ref 14.0–49.7)
MCH: 30 pg (ref 25.1–34.0)
MCHC: 33.8 g/dL (ref 31.5–36.0)
MCV: 88.7 fL (ref 79.5–101.0)
MONO#: 0 10*3/uL — ABNORMAL LOW (ref 0.1–0.9)
MONO%: 1.1 % (ref 0.0–14.0)
NEUT%: 16.5 % — ABNORMAL LOW (ref 38.4–76.8)
NEUTROS ABS: 0.2 10*3/uL — AB (ref 1.5–6.5)
NRBC: 0 % (ref 0–0)
PLATELETS: 3 10*3/uL — AB (ref 145–400)
RBC: 2.47 10*6/uL — AB (ref 3.70–5.45)
RDW: 16.6 % — AB (ref 11.2–14.5)
WBC: 0.9 10*3/uL — AB (ref 3.9–10.3)

## 2015-06-30 LAB — HOLD TUBE, BLOOD BANK

## 2015-06-30 LAB — PREPARE RBC (CROSSMATCH)

## 2015-06-30 MED ORDER — ACETAMINOPHEN 325 MG PO TABS
ORAL_TABLET | ORAL | Status: AC
  Filled 2015-06-30: qty 2

## 2015-06-30 MED ORDER — ACETAMINOPHEN 325 MG PO TABS
650.0000 mg | ORAL_TABLET | Freq: Once | ORAL | Status: AC
  Administered 2015-06-30: 650 mg via ORAL

## 2015-06-30 MED ORDER — SODIUM CHLORIDE 0.9 % IV SOLN
250.0000 mL | Freq: Once | INTRAVENOUS | Status: AC
Start: 2015-06-30 — End: 2015-06-30
  Administered 2015-06-30: 250 mL via INTRAVENOUS

## 2015-06-30 MED ORDER — DIPHENHYDRAMINE HCL 25 MG PO CAPS
ORAL_CAPSULE | ORAL | Status: AC
  Filled 2015-06-30: qty 1

## 2015-06-30 MED ORDER — DIPHENHYDRAMINE HCL 25 MG PO CAPS
25.0000 mg | ORAL_CAPSULE | Freq: Once | ORAL | Status: AC
  Administered 2015-06-30: 25 mg via ORAL

## 2015-06-30 NOTE — Patient Instructions (Signed)
Platelet Transfusion  A platelet transfusion is a procedure in which you receive donated platelets through an IV tube. Platelets are tiny pieces of blood cells. When a blood vessel is damaged, platelets collect in the damaged area to help form a blood clot. This begins the healing process. If your platelet count gets too low, your blood may have trouble clotting.  You may need a platelet transfusion if you have a condition that causes a low number of platelets (thrombocytopenia). A platelet transfusion may be used to stop or prevent bleeding.  LET [employer] CARE PROVIDER KNOW ABOUT:   Any allergies you have.   All medicines you are taking, including vitamins, herbs, eye drops, creams, and over-the-counter medicines.   Previous problems you or members of your family have had with the use of anesthetics.   Any blood disorders you have.   Previous surgeries you have had.   Any medical conditions you may have.   Any reactions you have had during a previous transfusion. RISKS AND COMPLICATIONS Generally, this is a safe procedure. However, problems may occur, including:   Fever with or without chills. The fever usually occurs within the first 4 hours of the transfusion and returns to normal within 48 hours.  Allergic reaction. The reaction is most commonly caused by antibodies your body creates against substances in the transfusion. Signs of an allergic reaction may include itching, hives, difficulty breathing, shock, or low blood pressure.  Sudden (acute) or delayed hemolytic reaction. This rare reaction can occur during the transfusion and up to 28 days after the transfusion. The reaction usually occurs when your body's defense system (immune system) attacks the new platelets. Signs of a hemolytic reaction may include fever, headache, difficulty breathing, low blood pressure, a rapid heartbeat, or pain in your back, abdomen, chest, or IV site.  Transfusion-related acute lung injury  (TRALI). TRALI can occur within hours of a transfusion, or several days later. This is a rare reaction that causes lung damage. The cause is not known.  Infection. Signs of this rare complication may include fever, chills, vomiting, a rapid heartbeat, or low blood pressure. BEFORE THE PROCEDURE   You may have a blood test to determine your blood type. This is necessary to find out what kind ofplatelets best matches your platelets.  If you have had an allergic reaction to a transfusion in the past, you may be given medicine to help prevent a reaction. Take this medicine only as directed by your health care provider.  Your temperature, blood pressure, and pulse will be monitored before the transfusion. PROCEDURE  An IV will be started in your hand or arm.  The transfusion will be attached to your IV tubing. The bag of donated platelets will be attached to your IV tube andgiven into your vein.  Your temperature, blood pressure, and pulse will be monitored regularly during the transfusion. This monitoring is done to help detect early signs of a transfusion reaction.  If you have any signs or symptoms of a reaction, your transfusion will be stopped and you may be given medicine.  When your transfusion is complete, your IV will be removed.  Pressure may be applied to the IV site for a few minutes.  A bandage (dressing) will be applied. The procedure may vary among health care providers and hospitals. AFTER THE PROCEDURE  Your blood pressure, temperature, and pulse will be monitored regularly.   This information is not intended to replace advice given to you by your health  care provider. Make sure you discuss any questions you have with your health care provider.   Document Released: 04/07/2007 Document Revised: 07/01/2014 Document Reviewed: 04/20/2014 Elsevier Interactive Patient Education 2016 Elsevier Inc.  Blood Transfusion  A blood transfusion is a procedure in which you  receive donated blood through an IV tube. You may need a blood transfusion because of illness, surgery, or injury. The blood may come from a donor, or it may be your own blood that you donated previously. The blood given in a transfusion is made up of different types of cells. You may receive:  Red blood cells. These carry oxygen and replace lost blood.  Platelets. These control bleeding.  Plasma. Thishelps blood to clot. If you have hemophilia or another clotting disorder, you may also receive other types of blood products. LET YOUR HEALTH CARE PROVIDER KNOW ABOUT:  Any allergies you have.  All medicines you are taking, including vitamins, herbs, eye drops, creams, and over-the-counter medicines.  Previous problems you or members of your family have had with the use of anesthetics.  Any blood disorders you have.  Previous surgeries you have had.  Any medical conditions you may have.  Any previous reactions you have had during a blood transfusion.  RISKS AND COMPLICATIONS Generally, this is a safe procedure. However, problems may occur, including:  Having an allergic reaction to something in the donated blood.  Fever. This may be a reaction to the white blood cells in the transfused blood.  Iron overload. This can happen from having many transfusions.  Transfusion-related acute lung injury (TRALI). This is a rare reaction that causes lung damage. The cause is not known.TRALI can occur within hours of a transfusion or several days later.  Sudden (acute) or delayed hemolytic reactions. This happens if your blood does not match the cells in your transfusion. Your body's defense system (immune system) may try to attack the new cells. This complication is rare.  Infection. This is rare. BEFORE THE PROCEDURE  You may have a blood test to determine your blood type. This is necessary to know what kind of blood your body will accept.  If you are going to have a planned surgery,  you may donate your own blood. This may be done in case you need to have a transfusion.  If you have had an allergic reaction to a transfusion in the past, you may be given medicine to help prevent a reaction. Take this medicine only as directed by your health care provider.  You will have your temperature, blood pressure, and pulse monitored before the transfusion. PROCEDURE   An IV will be started in your hand or arm.  The bag of donated blood will be attached to your IV tube and given into your vein.  Your temperature, blood pressure, and pulse will be monitored regularly during the transfusion. This monitoring is done to detect early signs of a transfusion reaction.  If you have any signs or symptoms of a reaction, your transfusion will be stopped and you may be given medicine.  When the transfusion is over, your IV will be removed.  Pressure may be applied to the IV site for a few minutes.  A bandage (dressing) will be applied. The procedure may vary among health care providers and hospitals. AFTER THE PROCEDURE  Your blood pressure, temperature, and pulse will be monitored regularly.   This information is not intended to replace advice given to you by your health care provider. Make sure you discuss   any questions you have with your health care provider.   Document Released: 06/07/2000 Document Revised: 07/01/2014 Document Reviewed: 04/20/2014 Elsevier Interactive Patient Education 2016 Elsevier Inc.  

## 2015-07-02 DIAGNOSIS — D649 Anemia, unspecified: Secondary | ICD-10-CM | POA: Diagnosis not present

## 2015-07-03 ENCOUNTER — Other Ambulatory Visit: Payer: Medicare Other

## 2015-07-03 ENCOUNTER — Telehealth: Payer: Self-pay | Admitting: Oncology

## 2015-07-03 ENCOUNTER — Telehealth: Payer: Self-pay | Admitting: *Deleted

## 2015-07-03 ENCOUNTER — Ambulatory Visit: Payer: Medicare Other

## 2015-07-03 ENCOUNTER — Ambulatory Visit: Payer: Medicare Other | Admitting: Nurse Practitioner

## 2015-07-03 LAB — PREPARE PLATELET PHERESIS: UNIT DIVISION: 0

## 2015-07-03 LAB — TYPE AND SCREEN
ABO/RH(D): O POS
Antibody Screen: NEGATIVE
UNIT DIVISION: 0
UNIT DIVISION: 0

## 2015-07-03 NOTE — Telephone Encounter (Signed)
Spoke with patient re lab/GM/tx 1/10 @ 12:30 pm - r/s from 1/9 per 1/9 pof - w/GM ok per desk nurse.

## 2015-07-03 NOTE — Telephone Encounter (Signed)
Patient states she cannot make it to today's appts due to ice in driveway; she is unable to drive and her neighbor cannot get her out of the driveway; requesting to reschedule appts. POF entered and Val RN w/ Dr. Jana Hakim made aware via voicemail.

## 2015-07-04 ENCOUNTER — Encounter (HOSPITAL_COMMUNITY): Payer: Self-pay

## 2015-07-04 ENCOUNTER — Encounter: Payer: Medicare Other | Admitting: Oncology

## 2015-07-04 ENCOUNTER — Other Ambulatory Visit: Payer: Medicare Other

## 2015-07-04 ENCOUNTER — Ambulatory Visit: Payer: Medicare Other

## 2015-07-04 ENCOUNTER — Encounter: Payer: Self-pay | Admitting: Oncology

## 2015-07-04 DIAGNOSIS — D696 Thrombocytopenia, unspecified: Secondary | ICD-10-CM | POA: Insufficient documentation

## 2015-07-04 NOTE — Progress Notes (Signed)
I faxed fmla form for sarah to [phone]. It was already completed--not sure by whom.

## 2015-07-05 ENCOUNTER — Ambulatory Visit: Payer: Medicare Other | Admitting: Internal Medicine

## 2015-07-05 ENCOUNTER — Other Ambulatory Visit (HOSPITAL_BASED_OUTPATIENT_CLINIC_OR_DEPARTMENT_OTHER): Payer: Medicare Other

## 2015-07-05 ENCOUNTER — Other Ambulatory Visit: Payer: Self-pay | Admitting: *Deleted

## 2015-07-05 ENCOUNTER — Telehealth: Payer: Self-pay | Admitting: *Deleted

## 2015-07-05 ENCOUNTER — Ambulatory Visit (HOSPITAL_BASED_OUTPATIENT_CLINIC_OR_DEPARTMENT_OTHER): Payer: Medicare Other

## 2015-07-05 VITALS — BP 157/74 | HR 78 | Temp 97.4°F

## 2015-07-05 DIAGNOSIS — D61818 Other pancytopenia: Secondary | ICD-10-CM

## 2015-07-05 DIAGNOSIS — C9200 Acute myeloblastic leukemia, not having achieved remission: Secondary | ICD-10-CM

## 2015-07-05 DIAGNOSIS — Z5111 Encounter for antineoplastic chemotherapy: Secondary | ICD-10-CM

## 2015-07-05 LAB — CBC WITH DIFFERENTIAL/PLATELET
BASO%: 0.5 % (ref 0.0–2.0)
Basophils Absolute: 0 10*3/uL (ref 0.0–0.1)
EOS%: 0.5 % (ref 0.0–7.0)
Eosinophils Absolute: 0 10*3/uL (ref 0.0–0.5)
HCT: 29.7 % — ABNORMAL LOW (ref 34.8–46.6)
HEMOGLOBIN: 9.9 g/dL — AB (ref 11.6–15.9)
LYMPH#: 1.8 10*3/uL (ref 0.9–3.3)
LYMPH%: 80.7 % — ABNORMAL HIGH (ref 14.0–49.7)
MCH: 30 pg (ref 25.1–34.0)
MCHC: 33.3 g/dL (ref 31.5–36.0)
MCV: 90 fL (ref 79.5–101.0)
MONO#: 0.1 10*3/uL (ref 0.1–0.9)
MONO%: 6.4 % (ref 0.0–14.0)
NEUT#: 0.3 10*3/uL — CL (ref 1.5–6.5)
NEUT%: 11.9 % — AB (ref 38.4–76.8)
Platelets: 19 10*3/uL — ABNORMAL LOW (ref 145–400)
RBC: 3.3 10*6/uL — AB (ref 3.70–5.45)
RDW: 15.6 % — ABNORMAL HIGH (ref 11.2–14.5)
WBC: 2.2 10*3/uL — ABNORMAL LOW (ref 3.9–10.3)
nRBC: 0 % (ref 0–0)

## 2015-07-05 MED ORDER — ONDANSETRON HCL 8 MG PO TABS
8.0000 mg | ORAL_TABLET | Freq: Once | ORAL | Status: AC
  Administered 2015-07-05: 8 mg via ORAL

## 2015-07-05 MED ORDER — AZACITIDINE CHEMO SQ INJECTION
75.0000 mg/m2 | Freq: Once | INTRAMUSCULAR | Status: AC
  Administered 2015-07-05: 140 mg via SUBCUTANEOUS
  Filled 2015-07-05: qty 5.6

## 2015-07-05 MED ORDER — ONDANSETRON HCL 8 MG PO TABS
ORAL_TABLET | ORAL | Status: AC
  Filled 2015-07-05: qty 1

## 2015-07-05 NOTE — Patient Instructions (Signed)
[facility] Discharge Instructions for Patients Receiving Chemotherapy  Today you received the following chemotherapy agents: Vidaza.  To help prevent nausea and vomiting after your treatment, we encourage you to take your nausea medication: Compazine 10 mg every 6 hours as needed; Zofran 8 mg every 12 hours as needed.   If you develop nausea and vomiting that is not controlled by your nausea medication, call the clinic.   BELOW ARE SYMPTOMS THAT SHOULD BE REPORTED IMMEDIATELY:  *FEVER GREATER THAN 100.5 F  *CHILLS WITH OR WITHOUT FEVER  NAUSEA AND VOMITING THAT IS NOT CONTROLLED WITH YOUR NAUSEA MEDICATION  *UNUSUAL SHORTNESS OF BREATH  *UNUSUAL BRUISING OR BLEEDING  TENDERNESS IN MOUTH AND THROAT WITH OR WITHOUT PRESENCE OF ULCERS  *URINARY PROBLEMS  *BOWEL PROBLEMS  UNUSUAL RASH Items with * indicate a potential emergency and should be followed up as soon as possible.  Feel free to call the clinic you have any questions or concerns. The clinic phone number is [phone].  Please show the [facility] at check-in to the Emergency Department and triage nurse.

## 2015-07-05 NOTE — Telephone Encounter (Signed)
Per staff message and POF I have scheduled appts. Advised scheduler of appts. JMW  

## 2015-07-05 NOTE — Progress Notes (Signed)
Labs reviewed. Ok to treat per Tivis Ringer, RN per Dr. Jana Hakim.  Reviewed the rest of the week's schedule with patient. She had forgotten she is to get IVIG tomorrow and Friday along with her SQ vidaza.  Pt will arrange for transportation and also she has a new furnace going in at her home tomorrow morning and needs to arrange for someone to be at her home for that.  She states she will be here tomorrow morning as scheduled.

## 2015-07-06 ENCOUNTER — Ambulatory Visit (HOSPITAL_BASED_OUTPATIENT_CLINIC_OR_DEPARTMENT_OTHER): Payer: Medicare Other

## 2015-07-06 ENCOUNTER — Other Ambulatory Visit: Payer: Self-pay | Admitting: Oncology

## 2015-07-06 ENCOUNTER — Telehealth: Payer: Self-pay | Admitting: Oncology

## 2015-07-06 ENCOUNTER — Other Ambulatory Visit (HOSPITAL_BASED_OUTPATIENT_CLINIC_OR_DEPARTMENT_OTHER): Payer: Medicare Other

## 2015-07-06 VITALS — BP 123/54 | HR 70 | Temp 98.1°F | Resp 16

## 2015-07-06 DIAGNOSIS — C9200 Acute myeloblastic leukemia, not having achieved remission: Secondary | ICD-10-CM | POA: Diagnosis not present

## 2015-07-06 DIAGNOSIS — D61818 Other pancytopenia: Secondary | ICD-10-CM

## 2015-07-06 DIAGNOSIS — Z5111 Encounter for antineoplastic chemotherapy: Secondary | ICD-10-CM

## 2015-07-06 DIAGNOSIS — D696 Thrombocytopenia, unspecified: Secondary | ICD-10-CM

## 2015-07-06 LAB — COMPREHENSIVE METABOLIC PANEL
ALBUMIN: 3.1 g/dL — AB (ref 3.5–5.0)
ALT: 12 U/L (ref 0–55)
ANION GAP: 5 meq/L (ref 3–11)
AST: 17 U/L (ref 5–34)
Alkaline Phosphatase: 72 U/L (ref 40–150)
BILIRUBIN TOTAL: 0.47 mg/dL (ref 0.20–1.20)
BUN: 18.7 mg/dL (ref 7.0–26.0)
CO2: 25 meq/L (ref 22–29)
CREATININE: 1.2 mg/dL — AB (ref 0.6–1.1)
Calcium: 8.3 mg/dL — ABNORMAL LOW (ref 8.4–10.4)
Chloride: 100 mEq/L (ref 98–109)
EGFR: 46 mL/min/{1.73_m2} — ABNORMAL LOW (ref >90)
GLUCOSE: 170 mg/dL — AB (ref 70–140)
Potassium: 4 mEq/L (ref 3.5–5.1)
Sodium: 131 mEq/L — ABNORMAL LOW (ref 136–145)
TOTAL PROTEIN: 8.2 g/dL (ref 6.4–8.3)

## 2015-07-06 MED ORDER — ONDANSETRON HCL 8 MG PO TABS
8.0000 mg | ORAL_TABLET | Freq: Once | ORAL | Status: AC
  Administered 2015-07-06: 8 mg via ORAL

## 2015-07-06 MED ORDER — IMMUNE GLOBULIN (HUMAN) 10 GM/100ML IV SOLN: 1.0000 g/kg | Freq: Once | INTRAVENOUS | Status: DC

## 2015-07-06 MED ORDER — SODIUM CHLORIDE 0.9 % IV SOLN
4.0000 mg | Freq: Once | INTRAVENOUS | Status: AC
  Administered 2015-07-06: 4 mg via INTRAVENOUS
  Filled 2015-07-06: qty 0.4

## 2015-07-06 MED ORDER — ACETAMINOPHEN 325 MG PO TABS
650.0000 mg | ORAL_TABLET | Freq: Once | ORAL | Status: AC
  Administered 2015-07-06: 650 mg via ORAL

## 2015-07-06 MED ORDER — ONDANSETRON HCL 8 MG PO TABS
ORAL_TABLET | ORAL | Status: AC
  Filled 2015-07-06: qty 1

## 2015-07-06 MED ORDER — DIPHENHYDRAMINE HCL 25 MG PO CAPS
ORAL_CAPSULE | ORAL | Status: AC
  Filled 2015-07-06: qty 1

## 2015-07-06 MED ORDER — ACETAMINOPHEN 325 MG PO TABS
ORAL_TABLET | ORAL | Status: AC
  Filled 2015-07-06: qty 2

## 2015-07-06 MED ORDER — AZACITIDINE CHEMO SQ INJECTION
75.0000 mg/m2 | Freq: Once | INTRAMUSCULAR | Status: AC
  Administered 2015-07-06: 140 mg via SUBCUTANEOUS
  Filled 2015-07-06: qty 5.6

## 2015-07-06 MED ORDER — DIPHENHYDRAMINE HCL 25 MG PO CAPS
25.0000 mg | ORAL_CAPSULE | Freq: Once | ORAL | Status: AC
  Administered 2015-07-06: 25 mg via ORAL

## 2015-07-06 MED ORDER — IMMUNE GLOBULIN (HUMAN) 10 GM/100ML IV SOLN
70.0000 g | INTRAVENOUS | Status: DC
  Administered 2015-07-06: 70 g via INTRAVENOUS
  Filled 2015-07-06: qty 700

## 2015-07-06 NOTE — Progress Notes (Unsigned)
I am copying below for future reference Dr Lyman Speller very helpful discussion re Jalyiah's situation:    DIAGNOSIS: AML with multilineage dysplasia, not in remission TREATMENT: C1D1 Azacitadine 06/05/15  DISCUSSION:  I reviewed with the patient today the role of allogeneic stem cell transplant (Allo-SCT) in the treatment of very high risk myelodysplastic syndrome (MDS) and AML with MLD - AML arising from MDS. IPSS-R does not really apply since she has transformed into AML by WHO criteria. Most patients with good performance status, optimal organ function, optimal donor identified and minimal myeloblasts in the bone marrow have the greatest potential for cure by allo-SCT. That said, in MDS the superiority of transplant over azacitadine has not been established in this age group. An ongoing clinical observational trial - observing individuals for outcomes based on whether they have a transplant or not - is being run by [facility] (CTN 1102), in an effort to address the "which is better" question. Given the increased risk of toxicity with age and the need to use reduced chemotherapy, cure rates are lower than ideal. For fit patients in remission with a good donor, AlloSCT for AML can be curative regardless of age.  We reviewed the following information:  1. MDS is associated with variable length of survival and risk of transformation to AML; AML is associated with variable length of survival with standard dose chemotherapy induction and consolidation based on the age of the patient, the biology of the disease as predicted by karyotypic and molecular abnormalities, and response to initial chemotherapy. Standard chemotherapy induction achieves remission less often when there is a background of MDS and is associated with greater toxicity with age. Alternative approaches for remission with hypomethylator agents (HMA) is a reasonable strategy in this setting. Durable remissions are rarely observed in older AML patients  consolidated with chemotherapy alone. For patients with intermediate- and high-risk AML the likelihood of durable remission is optimized when allogeneic transplant with an HLA-matched donor is used for consolidation of remission.  2. Allogeneic transplant is a potentially curable treatment for AML/MDS. Even with optimal response wrt suppressing AML, [name] still has a marrow that is affected by MDS. The [facility] has reported outcomes on nearly 4000 transplants in MDS and the 3-yr survival is around 40-50% depending on disease status and quality of donor. Age of recipient can impact the outcome, as well. With current treatment and supportive care strategies, 80-90% of patients over age 47 survive more than 100 days and 60% or more of patients transplanted over age 80 survive the first year post-transplant. Differences between fully matched sibling donors and fully matched unrelated donors are minimal, particularly under age 7. That said, I queried the [facility] for results of with transplant for patients over age 52 and 71 getting transplant for MDS. The numbers of patients are somewhat low - about 266 transplants in the age 67-74 since 2000 - nearly all of them since 2011, and, 24 transplants age 42 or older, all since 2012. Of the 24 patients transplanted age 67 or older (one third of them just 1 year from transplant), 10 remain alive. Looking at all patients over age 89 the 1 yr survival is 55%, 2 yr survival is 38%, and, 3 yr survival is 31%. This appears to be somewhat less than what is observed in the aggregate. There is not enough data to determine if sibling or unrelated donor changes outcome. It is likely that the use of NMA regimen leads to a higher relapse rate, but again,  insufficient data. In published [facility] data, AML outcomes are comparable to the MDS data - at least for the age <70.  3. For older adults, transplant with reduced intensity conditioning chemotherapy given prior to the allogeneic  stem cells is associated with outcomes comparable to those associated with myeloablative conditioning chemo given historically. In reduced intensity transplants reduced doses of chemotherapy are given to suppress host-derived and residual malignant cells and intense immunotherapy is given to significantly suppress normal immune function, allowing donor cells to engraft. In Ms. Kling's age group, non-myeloablative chemo is given to reduce chemo-toxicity. There is no question that this is likely limiting to the ultimate success of the transplant but more intense therapy isn't feasible.   4. Reduced intensity chemotherapy is still associated with significant toxicities including marrow suppression with increased risk of infection and bleeding due to cytopenias; transfusion dependency; mucosal and gastrointestinal side-effects due to the toxic effects of chemotherapy; risk for injury and decreases in normal heart, lung, liver, kidney or brain function; and risk of relapse, long-term health problems or late-effects of chemotherapy. Some patients experience early or late graft-versus-host disease which can be associated with immune suppression and other symptoms for months or years. Rarely, patients can experience rejection - failure of the donor cells to grow or sustain growth - which could lead to the need of additional cellular therapy. In fact, we would not offer RIC conditioning at her age - non-myeloablative [facility]) regimen would be considered optimal. This typically is chemo that does not achieve much myelosuppression, but significantly suppresses normal immune function - giving donor cells an engraftment advantage. The risks noted still apply. Some patients experience early or late graft-versus-host disease which can be associated with immune suppression and other symptoms for months or years.   5. We discussed transplant versus continuing with hypomethylator therapy as a decision about how Ms. Marcella  would like to live this part of her life. I made her aware that transplant at her age was a rare endeavor and we, like most others, have limited experience. In the [place], [place] [facility] [place] in [place] has the most experience with NMA transplants in the elderly.   6. Planning for transplant includes an assessment of other health conditions and organ function, identification of a caregiver, discussion with psycho-oncology services to help identify stressors that challenge successful outcomes, access to healthcare and a desire to participate in the complex medical regimen that follows a transplant including a minimum of 12 months of regular visits, daily medications, and a lifestyle that prioritizes health and minimizes risk of infections. Frequent travel to and from the cancer center would be an expectation.   PLAN: In my discussion today I have underscored the need to continue with monthly vidaza chemo. Depending on the quality of her response, there may be an opportunity to consider something more. She should have a marrow done after about 4 cycles to determine her response at that time. At this time we are not recommending organ function evaluations and donor search until there is greater feasibility. Additionally, her frailty is a bit higher than would be ideal. We will consider evaluating her with the geriatric assessment tool when she returns.   Moving forward is contingent on 1) identifying an optimal donor; 2) AML remission; 3) acceptable/manageable co-morbidities that do not introduce a substantial hurdle to survival; 4) willing and capable caregiver; and, 5) patient's desire to follow the complex medical regimen that is associated with the transplant for several months after transplant and work  to maintain performance status before and after transplant.   Napoleon Form, MD 06/29/2015  Electronically signed by: Napoleon Form, MD 07/01/15 2231  Electronically signed by: Napoleon Form,  MD 07/01/15 2337

## 2015-07-06 NOTE — Telephone Encounter (Signed)
Appointments made and avs will be printed in chemo  °

## 2015-07-06 NOTE — Progress Notes (Signed)
This encounter was created in error - please disregard.

## 2015-07-06 NOTE — Patient Instructions (Signed)

## 2015-07-07 ENCOUNTER — Ambulatory Visit (HOSPITAL_BASED_OUTPATIENT_CLINIC_OR_DEPARTMENT_OTHER): Payer: Medicare Other

## 2015-07-07 VITALS — BP 158/60 | HR 70 | Temp 97.7°F | Resp 18

## 2015-07-07 DIAGNOSIS — D696 Thrombocytopenia, unspecified: Secondary | ICD-10-CM | POA: Diagnosis not present

## 2015-07-07 DIAGNOSIS — D61818 Other pancytopenia: Secondary | ICD-10-CM

## 2015-07-07 DIAGNOSIS — C9200 Acute myeloblastic leukemia, not having achieved remission: Secondary | ICD-10-CM | POA: Diagnosis not present

## 2015-07-07 DIAGNOSIS — Z5111 Encounter for antineoplastic chemotherapy: Secondary | ICD-10-CM

## 2015-07-07 LAB — AFB CULTURE WITH SMEAR (NOT AT ARMC): ACID FAST SMEAR: NONE SEEN

## 2015-07-07 MED ORDER — DIPHENHYDRAMINE HCL 25 MG PO CAPS
ORAL_CAPSULE | ORAL | Status: AC
  Filled 2015-07-07: qty 1

## 2015-07-07 MED ORDER — ONDANSETRON HCL 8 MG PO TABS
8.0000 mg | ORAL_TABLET | Freq: Once | ORAL | Status: AC
  Administered 2015-07-07: 8 mg via ORAL

## 2015-07-07 MED ORDER — SODIUM CHLORIDE 0.9 % IV SOLN
Freq: Once | INTRAVENOUS | Status: AC
  Administered 2015-07-07: 10:00:00 via INTRAVENOUS

## 2015-07-07 MED ORDER — ACETAMINOPHEN 325 MG PO TABS
650.0000 mg | ORAL_TABLET | Freq: Once | ORAL | Status: AC
Start: 2015-07-07 — End: 2015-07-07
  Administered 2015-07-07: 650 mg via ORAL

## 2015-07-07 MED ORDER — ACETAMINOPHEN 325 MG PO TABS
ORAL_TABLET | ORAL | Status: AC
  Filled 2015-07-07: qty 2

## 2015-07-07 MED ORDER — AZACITIDINE CHEMO SQ INJECTION
75.0000 mg/m2 | Freq: Once | INTRAMUSCULAR | Status: AC
  Administered 2015-07-07: 140 mg via SUBCUTANEOUS
  Filled 2015-07-07: qty 5.6

## 2015-07-07 MED ORDER — SODIUM CHLORIDE 0.9 % IV SOLN
4.0000 mg | Freq: Once | INTRAVENOUS | Status: AC
  Administered 2015-07-07: 4 mg via INTRAVENOUS
  Filled 2015-07-07: qty 0.4

## 2015-07-07 MED ORDER — ROMIPLOSTIM 250 MCG ~~LOC~~ SOLR
70.0000 ug | Freq: Once | SUBCUTANEOUS | Status: AC
  Administered 2015-07-07: 70 ug via SUBCUTANEOUS
  Filled 2015-07-07: qty 0.14

## 2015-07-07 MED ORDER — DIPHENHYDRAMINE HCL 25 MG PO TABS
25.0000 mg | ORAL_TABLET | Freq: Once | ORAL | Status: AC
Start: 2015-07-07 — End: 2015-07-07
  Administered 2015-07-07: 25 mg via ORAL
  Filled 2015-07-07: qty 1

## 2015-07-07 MED ORDER — IMMUNE GLOBULIN (HUMAN) 10 GM/100ML IV SOLN
70.0000 g | Freq: Once | INTRAVENOUS | Status: AC
  Administered 2015-07-07: 70 g via INTRAVENOUS
  Filled 2015-07-07: qty 700

## 2015-07-07 MED ORDER — ONDANSETRON HCL 8 MG PO TABS
ORAL_TABLET | ORAL | Status: AC
  Filled 2015-07-07: qty 1

## 2015-07-07 NOTE — Patient Instructions (Addendum)
[facility] Discharge Instructions for Patients Receiving Chemotherapy  Today you received the following chemotherapy agents: Vidaza   To help prevent nausea and vomiting after your treatment, we encourage you to take your nausea medication as directed.    If you develop nausea and vomiting that is not controlled by your nausea medication, call the clinic.   BELOW ARE SYMPTOMS THAT SHOULD BE REPORTED IMMEDIATELY:  *FEVER GREATER THAN 100.5 F  *CHILLS WITH OR WITHOUT FEVER  NAUSEA AND VOMITING THAT IS NOT CONTROLLED WITH YOUR NAUSEA MEDICATION  *UNUSUAL SHORTNESS OF BREATH  *UNUSUAL BRUISING OR BLEEDING  TENDERNESS IN MOUTH AND THROAT WITH OR WITHOUT PRESENCE OF ULCERS  *URINARY PROBLEMS  *BOWEL PROBLEMS  UNUSUAL RASH Items with * indicate a potential emergency and should be followed up as soon as possible.  Feel free to call the clinic you have any questions or concerns. The clinic phone number is [phone].  Please show the [place] at check-in to the Emergency Department and triage nurse.    Immune Globulin Injection What is this medicine? IMMUNE GLOBULIN (im MUNE GLOB yoo lin) helps to prevent or reduce the severity of certain infections in patients who are at risk. This medicine is collected from the pooled blood of many donors. It is used to treat immune system problems, thrombocytopenia, and Kawasaki syndrome. This medicine may be used for other purposes; ask your health care provider or pharmacist if you have questions. What should I tell my health care provider before I take this medicine? They need to know if you have any of these conditions: - diabetes - extremely low or no immune antibodies in the blood - heart disease - history of blood clots - hyperprolinemia - infection in the blood, sepsis - kidney disease - taking medicine that may change kidney function - ask your health care provider about your medicine - an  unusual or allergic reaction to human immune globulin, albumin, maltose, sucrose, polysorbate 80, other medicines, foods, dyes, or preservatives - pregnant or trying to get pregnant - breast-feeding How should I use this medicine? This medicine is for injection into a muscle or infusion into a vein or skin. It is usually given by a health care professional in a hospital or clinic setting. In rare cases, some brands of this medicine might be given at home. You will be taught how to give this medicine. Use exactly as directed. Take your medicine at regular intervals. Do not take your medicine more often than directed. Talk to your pediatrician regarding the use of this medicine in children. Special care may be needed. Overdosage: If you think you have taken too much of this medicine contact a poison control center or emergency room at once. NOTE: This medicine is only for you. Do not share this medicine with others. What if I miss a dose? It is important not to miss your dose. Call your doctor or health care professional if you are unable to keep an appointment. If you give yourself the medicine and you miss a dose, take it as soon as you can. If it is almost time for your next dose, take only that dose. Do not take double or extra doses. What may interact with this medicine? -aspirin and aspirin-like medicines -cisplatin -cyclosporine -medicines for infection like acyclovir, adefovir, amphotericin B, bacitracin, cidofovir, foscarnet, ganciclovir, gentamicin, pentamidine, vancomycin -NSAIDS, medicines for pain and inflammation, like ibuprofen or naproxen -pamidronate -vaccines -zoledronic acid This list may not describe all possible  interactions. Give your health care provider a list of all the medicines, herbs, non-prescription drugs, or dietary supplements you use. Also tell them if you smoke, drink alcohol, or use illegal drugs. Some items may interact with your medicine. What should I watch  for while using this medicine? Your condition will be monitored carefully while you are receiving this medicine. This medicine is made from pooled blood donations of many different people. It may be possible to pass an infection in this medicine. However, the donors are screened for infections and all products are tested for HIV and hepatitis. The medicine is treated to kill most or all bacteria and viruses. Talk to your doctor about the risks and benefits of this medicine. Do not have vaccinations for at least 14 days before, or until at least 3 months after receiving this medicine. What side effects may I notice from receiving this medicine? Side effects that you should report to your doctor or health care professional as soon as possible: -allergic reactions like skin rash, itching or hives, swelling of the face, lips, or tongue -breathing problems -chest pain or tightness -fever, chills -headache with nausea, vomiting -neck pain or difficulty moving neck -pain when moving eyes -pain, swelling, warmth in the leg -problems with balance, talking, walking -sudden weight gain -swelling of the ankles, feet, hands -trouble passing urine or change in the amount of urine Side effects that usually do not require medical attention (report to your doctor or health care professional if they continue or are bothersome): -dizzy, drowsy -flushing -increased sweating -leg cramps -muscle aches and pains -pain at site where injected This list may not describe all possible side effects. Call your doctor for medical advice about side effects. You may report side effects to FDA at 1-800-FDA-1088. Where should I keep my medicine? Keep out of the reach of children. This drug is usually given in a hospital or clinic and will not be stored at home. In rare cases, some brands of this medicine may be given at home. If you are using this medicine at home, you will be instructed on how to store this medicine.  Throw away any unused medicine after the expiration date on the label. NOTE: This sheet is a summary. It may not cover all possible information. If you have questions about this medicine, talk to your doctor, pharmacist, or health care provider.    2016, Elsevier/Gold Standard. (2008-08-31 11:44:49)  Romiplostim injection What is this medicine? ROMIPLOSTIM (roe mi PLOE stim) helps your body make more platelets. This medicine is used to treat low platelets caused by chronic idiopathic thrombocytopenic purpura (ITP). This medicine may be used for other purposes; ask your health care provider or pharmacist if you have questions. What should I tell my health care provider before I take this medicine? They need to know if you have any of these conditions: -cancer or myelodysplastic syndrome -low blood counts, like low white cell, platelet, or red cell counts -take medicines that treat or prevent blood clots -an unusual or allergic reaction to romiplostim, mannitol, other medicines, foods, dyes, or preservatives -pregnant or trying to get pregnant -breast-feeding How should I use this medicine? This medicine is for injection under the skin. It is given by a health care professional in a hospital or clinic setting. A special MedGuide will be given to you before your injection. Read this information carefully each time. Talk to your pediatrician regarding the use of this medicine in children. Special care may be needed. Overdosage: If  you think you have taken too much of this medicine contact a poison control center or emergency room at once. NOTE: This medicine is only for you. Do not share this medicine with others. What if I miss a dose? It is important not to miss your dose. Call your doctor or health care professional if you are unable to keep an appointment. What may interact with this medicine? Interactions are not expected. This list may not describe all possible interactions. Give your  health care provider a list of all the medicines, herbs, non-prescription drugs, or dietary supplements you use. Also tell them if you smoke, drink alcohol, or use illegal drugs. Some items may interact with your medicine. What should I watch for while using this medicine? Your condition will be monitored carefully while you are receiving this medicine. Visit your prescriber or health care professional for regular checks on your progress and for the needed blood tests. It is important to keep all appointments. What side effects may I notice from receiving this medicine? Side effects that you should report to your doctor or health care professional as soon as possible: -allergic reactions like skin rash, itching or hives, swelling of the face, lips, or tongue -shortness of breath, chest pain, swelling in a leg -unusual bleeding or bruising Side effects that usually do not require medical attention (report to your doctor or health care professional if they continue or are bothersome): -dizziness -headache -muscle aches -pain in arms and legs -stomach pain -trouble sleeping This list may not describe all possible side effects. Call your doctor for medical advice about side effects. You may report side effects to FDA at 1-800-FDA-1088. Where should I keep my medicine? This drug is given in a hospital or clinic and will not be stored at home. NOTE: This sheet is a summary. It may not cover all possible information. If you have questions about this medicine, talk to your doctor, pharmacist, or health care provider.    2016, Elsevier/Gold Standard. (2008-02-08 15:13:04)

## 2015-07-07 NOTE — Progress Notes (Signed)
1000: Per infusion note on 07/04/14 okay to treat with labs from 07/04/14

## 2015-07-10 ENCOUNTER — Other Ambulatory Visit: Payer: Self-pay | Admitting: Family

## 2015-07-10 ENCOUNTER — Ambulatory Visit (HOSPITAL_BASED_OUTPATIENT_CLINIC_OR_DEPARTMENT_OTHER): Payer: Medicare Other

## 2015-07-10 ENCOUNTER — Other Ambulatory Visit (HOSPITAL_BASED_OUTPATIENT_CLINIC_OR_DEPARTMENT_OTHER): Payer: Medicare Other

## 2015-07-10 VITALS — BP 136/58 | HR 69 | Temp 97.7°F | Resp 16

## 2015-07-10 DIAGNOSIS — C9200 Acute myeloblastic leukemia, not having achieved remission: Secondary | ICD-10-CM

## 2015-07-10 DIAGNOSIS — D61818 Other pancytopenia: Secondary | ICD-10-CM

## 2015-07-10 DIAGNOSIS — Z5111 Encounter for antineoplastic chemotherapy: Secondary | ICD-10-CM | POA: Diagnosis not present

## 2015-07-10 LAB — COMPREHENSIVE METABOLIC PANEL
ALBUMIN: 3.2 g/dL — AB (ref 3.5–5.0)
ALT: 14 U/L (ref 0–55)
AST: 19 U/L (ref 5–34)
Alkaline Phosphatase: 62 U/L (ref 40–150)
Anion Gap: 9 mEq/L (ref 3–11)
BUN: 18.3 mg/dL (ref 7.0–26.0)
CHLORIDE: 101 meq/L (ref 98–109)
CO2: 25 meq/L (ref 22–29)
Calcium: 8.9 mg/dL (ref 8.4–10.4)
Creatinine: 1.1 mg/dL (ref 0.6–1.1)
EGFR: 49 mL/min/{1.73_m2} — AB (ref >90)
GLUCOSE: 143 mg/dL — AB (ref 70–140)
POTASSIUM: 3.4 meq/L — AB (ref 3.5–5.1)
SODIUM: 135 meq/L — AB (ref 136–145)
Total Bilirubin: 0.59 mg/dL (ref 0.20–1.20)
Total Protein: 8.7 g/dL — ABNORMAL HIGH (ref 6.4–8.3)

## 2015-07-10 LAB — CBC WITH DIFFERENTIAL/PLATELET
BASO%: 0 % (ref 0.0–2.0)
BASOS ABS: 0 10*3/uL (ref 0.0–0.1)
EOS ABS: 0 10*3/uL (ref 0.0–0.5)
EOS%: 0.5 % (ref 0.0–7.0)
HCT: 31.1 % — ABNORMAL LOW (ref 34.8–46.6)
HEMOGLOBIN: 10.4 g/dL — AB (ref 11.6–15.9)
LYMPH%: 54.1 % — AB (ref 14.0–49.7)
MCH: 31.1 pg (ref 25.1–34.0)
MCHC: 33.4 g/dL (ref 31.5–36.0)
MCV: 93.1 fL (ref 79.5–101.0)
MONO#: 0.3 10*3/uL (ref 0.1–0.9)
MONO%: 13.5 % (ref 0.0–14.0)
NEUT%: 31.9 % — ABNORMAL LOW (ref 38.4–76.8)
NEUTROS ABS: 0.7 10*3/uL — AB (ref 1.5–6.5)
Platelets: 73 10*3/uL — ABNORMAL LOW (ref 145–400)
RBC: 3.34 10*6/uL — AB (ref 3.70–5.45)
RDW: 18.1 % — AB (ref 11.2–14.5)
WBC: 2.1 10*3/uL — AB (ref 3.9–10.3)
lymph#: 1.1 10*3/uL (ref 0.9–3.3)

## 2015-07-10 MED ORDER — ONDANSETRON HCL 8 MG PO TABS
8.0000 mg | ORAL_TABLET | Freq: Once | ORAL | Status: AC
  Administered 2015-07-10: 8 mg via ORAL

## 2015-07-10 MED ORDER — ONDANSETRON HCL 8 MG PO TABS
ORAL_TABLET | ORAL | Status: AC
  Filled 2015-07-10: qty 1

## 2015-07-10 MED ORDER — AZACITIDINE CHEMO SQ INJECTION
75.0000 mg/m2 | Freq: Once | INTRAMUSCULAR | Status: AC
  Administered 2015-07-10: 140 mg via SUBCUTANEOUS
  Filled 2015-07-10: qty 5.6

## 2015-07-10 NOTE — Progress Notes (Signed)
OK to treat with current lab values per Zigmund Daniel RN per Dr Jana Hakim. Pt was stuck 5 times. The 1st injection needle clogged. And the 3rd injection needle clogged twice. Pt refused 6th stick. Therefore she received 2/3 of dose. Dr Jana Hakim informe

## 2015-07-11 ENCOUNTER — Ambulatory Visit (HOSPITAL_BASED_OUTPATIENT_CLINIC_OR_DEPARTMENT_OTHER): Payer: Medicare Other

## 2015-07-11 VITALS — BP 156/61 | HR 74 | Temp 97.6°F | Resp 18

## 2015-07-11 DIAGNOSIS — Z5111 Encounter for antineoplastic chemotherapy: Secondary | ICD-10-CM | POA: Diagnosis not present

## 2015-07-11 DIAGNOSIS — C9200 Acute myeloblastic leukemia, not having achieved remission: Secondary | ICD-10-CM | POA: Diagnosis not present

## 2015-07-11 DIAGNOSIS — D61818 Other pancytopenia: Secondary | ICD-10-CM

## 2015-07-11 MED ORDER — ONDANSETRON HCL 8 MG PO TABS
ORAL_TABLET | ORAL | Status: AC
  Filled 2015-07-11: qty 1

## 2015-07-11 MED ORDER — AZACITIDINE CHEMO SQ INJECTION
75.0000 mg/m2 | Freq: Once | INTRAMUSCULAR | Status: AC
  Administered 2015-07-11: 140 mg via SUBCUTANEOUS
  Filled 2015-07-11: qty 5.6

## 2015-07-11 MED ORDER — ONDANSETRON HCL 8 MG PO TABS
8.0000 mg | ORAL_TABLET | Freq: Once | ORAL | Status: AC
  Administered 2015-07-11: 8 mg via ORAL

## 2015-07-11 NOTE — Patient Instructions (Signed)
[facility] Discharge Instructions for Patients Receiving Chemotherapy  Today you received the following chemotherapy agents: Vidaza   To help prevent nausea and vomiting after your treatment, we encourage you to take your nausea medication as directed.    If you develop nausea and vomiting that is not controlled by your nausea medication, call the clinic.   BELOW ARE SYMPTOMS THAT SHOULD BE REPORTED IMMEDIATELY:  *FEVER GREATER THAN 100.5 F  *CHILLS WITH OR WITHOUT FEVER  NAUSEA AND VOMITING THAT IS NOT CONTROLLED WITH YOUR NAUSEA MEDICATION  *UNUSUAL SHORTNESS OF BREATH  *UNUSUAL BRUISING OR BLEEDING  TENDERNESS IN MOUTH AND THROAT WITH OR WITHOUT PRESENCE OF ULCERS  *URINARY PROBLEMS  *BOWEL PROBLEMS  UNUSUAL RASH Items with * indicate a potential emergency and should be followed up as soon as possible.  Feel free to call the clinic you have any questions or concerns. The clinic phone number is [phone].  Please show the CHEMO ALERT CARD at check-in to the Emergency Department and triage nurse.   

## 2015-07-12 ENCOUNTER — Other Ambulatory Visit: Payer: Medicare Other

## 2015-07-12 ENCOUNTER — Other Ambulatory Visit (HOSPITAL_BASED_OUTPATIENT_CLINIC_OR_DEPARTMENT_OTHER): Payer: Medicare Other

## 2015-07-12 ENCOUNTER — Ambulatory Visit (HOSPITAL_BASED_OUTPATIENT_CLINIC_OR_DEPARTMENT_OTHER): Payer: Medicare Other | Admitting: Nurse Practitioner

## 2015-07-12 ENCOUNTER — Ambulatory Visit: Payer: Medicare Other | Admitting: Nurse Practitioner

## 2015-07-12 ENCOUNTER — Ambulatory Visit (HOSPITAL_BASED_OUTPATIENT_CLINIC_OR_DEPARTMENT_OTHER): Payer: Medicare Other

## 2015-07-12 ENCOUNTER — Encounter: Payer: Self-pay | Admitting: Nurse Practitioner

## 2015-07-12 DIAGNOSIS — D61818 Other pancytopenia: Secondary | ICD-10-CM | POA: Diagnosis not present

## 2015-07-12 DIAGNOSIS — T451X5A Adverse effect of antineoplastic and immunosuppressive drugs, initial encounter: Secondary | ICD-10-CM

## 2015-07-12 DIAGNOSIS — D696 Thrombocytopenia, unspecified: Secondary | ICD-10-CM

## 2015-07-12 DIAGNOSIS — D649 Anemia, unspecified: Secondary | ICD-10-CM

## 2015-07-12 DIAGNOSIS — R112 Nausea with vomiting, unspecified: Secondary | ICD-10-CM

## 2015-07-12 DIAGNOSIS — C9200 Acute myeloblastic leukemia, not having achieved remission: Secondary | ICD-10-CM

## 2015-07-12 DIAGNOSIS — K5900 Constipation, unspecified: Secondary | ICD-10-CM | POA: Insufficient documentation

## 2015-07-12 LAB — CBC WITH DIFFERENTIAL/PLATELET
BASO%: 0 % (ref 0.0–2.0)
BASOS ABS: 0 10*3/uL (ref 0.0–0.1)
EOS ABS: 0 10*3/uL (ref 0.0–0.5)
EOS%: 0.2 % (ref 0.0–7.0)
HEMATOCRIT: 32.1 % — AB (ref 34.8–46.6)
HEMOGLOBIN: 10.8 g/dL — AB (ref 11.6–15.9)
LYMPH#: 2.2 10*3/uL (ref 0.9–3.3)
LYMPH%: 44.1 % (ref 14.0–49.7)
MCH: 31.3 pg (ref 25.1–34.0)
MCHC: 33.6 g/dL (ref 31.5–36.0)
MCV: 93 fL (ref 79.5–101.0)
MONO#: 0.4 10*3/uL (ref 0.1–0.9)
MONO%: 8.8 % (ref 0.0–14.0)
NEUT%: 46.9 % (ref 38.4–76.8)
NEUTROS ABS: 2.3 10*3/uL (ref 1.5–6.5)
PLATELETS: 122 10*3/uL — AB (ref 145–400)
RBC: 3.45 10*6/uL — ABNORMAL LOW (ref 3.70–5.45)
RDW: 19.2 % — AB (ref 11.2–14.5)
WBC: 4.9 10*3/uL (ref 3.9–10.3)

## 2015-07-12 MED ORDER — ONDANSETRON HCL 8 MG PO TABS
8.0000 mg | ORAL_TABLET | Freq: Once | ORAL | Status: AC
  Administered 2015-07-12: 8 mg via ORAL

## 2015-07-12 MED ORDER — ONDANSETRON HCL 8 MG PO TABS
ORAL_TABLET | ORAL | Status: AC
  Filled 2015-07-12: qty 1

## 2015-07-12 MED ORDER — AZACITIDINE CHEMO SQ INJECTION
75.0000 mg/m2 | Freq: Once | INTRAMUSCULAR | Status: AC
  Administered 2015-07-12: 140 mg via SUBCUTANEOUS
  Filled 2015-07-12: qty 5.6

## 2015-07-12 NOTE — Progress Notes (Signed)
Critical lab call for patient's hgb - 10.7  Per Gentry Fitz, NP patient will not need blood today.

## 2015-07-12 NOTE — Progress Notes (Signed)
[name]  [phone] [phone]) [phone]     ID: [name] DOB: Jun 26, 1935  MR#: [id number]  PTW#:[id number]  Patient Care Team: [facility], FNP as PCP - General (Family Medicine) PCP: Alisha Po, FNP, [name] GYN: SU:  OTHER MD: [name], [name], [name]  CHIEF COMPLAINT: Acute myeloid leukemia  CURRENT TREATMENT: Azacitidine, tranexamic acid  HISTORY OF PRESENT ILLNESS: From the 05/22/2025 consult note:  "The patient was evaluated at her PCP's office 05/22/2015 with a complaint of DOE worsening over several months. Labwork was obtained including a CBC, whioch showed WBC 1.8, platelets 14K, and Hb 5.9 with an MCV of 97.5. DDimer was 1.04. Accordingly the patient was referred to the ED and was admitted 05/23/2015.   Labwork since admission includes a repeat CBC with WBC 1.7, HB 5.4, MCV 100.6 and platelets 10K. Creat was 1.04 with GFR 50. Reticulocytes are not elevated with abs 39.3 (1.7%) and LDH is normal at 188. Other labs are pending.  The patient tells me because of peripheral neuropathy she has been receiving monthly B-12 shots since 2005."  Bone marrow biopsy 05/25/2015 showed acute myeloid leukemia, with 17% blasts by flow cytometry, 24% by aspirate counts, and 20-30% by immunohistochemistry ([id number], and [id number]). The blasts were positive for CD 117 and focally for MPO. There were also myelodysplasia related changes in all 3 cell lines.  The patient's subsequent history is as detailed below.  INTERVAL HISTORY: [name] returns today for follow-up of her myelodysplasia/acute myeloid leukemia, alone. Today is day 6, cycle 2 of azacitidine, given days 1 through 7 of each 28 day cycle.   REVIEW OF SYSTEMS: [name] has a rough time these past few days. She has vomited 3 times in the last 36 hours. She has both zofran and compazine prescribed, but she seems to misunderstand the dosing frequency of each  medication, and is likely underdosing herself. Yesterday, she did not not take much of either and decided to lay down instead. Of course 1 hr later she ended up vomiting. She is chronically constipated, but is only taking 2 tablets of senna daily. She has not been using miralax because it takes too long to work. Her last bowel movement was on Sunday and it was hard and formed in to small tight balls. Her appetite is fair. She has tea with every meal, but does not purposefully drink water outside of this. She denies mouth sores or rashes. She sleeps well with melatonin. She is mildly fatigued. She denies shortness of breath, chest pain, cough, or palpitations. A detailed review of systems is otherwise stable.  PAST MEDICAL HISTORY: Past Medical History  Diagnosis Date  . GERD (gastroesophageal reflux disease)   . Depression   . Hypertension   . Shortness of breath dyspnea     with exertion  . History of blood transfusion 05/23/2015    "Hgb 5.9"  . Anemia   . Arthritis     "hand joints; hips; back" (05/23/2015)  . Chronic lower back pain   . Anxiety     PAST SURGICAL HISTORY: Past Surgical History  Procedure Laterality Date  . Bone spur      R thigh  . Total shoulder arthroplasty  09/13/2011    Procedure: TOTAL SHOULDER ARTHROPLASTY;  Surgeon: Augustin Schooling, MD;  Location: [facility];  Service: Orthopedics;  Laterality: Left;  LEFT SHOULDER REVERSED TOTAL SHOULDER ARTHROPLASTY  . Total knee arthroplasty  04/27/2012    Procedure: TOTAL  KNEE ARTHROPLASTY;  Surgeon: Mauri Pole, MD;  Location: [facility];  Service: Orthopedics;  Laterality: Right;  . Cataract extraction w/ intraocular lens  implant, bilateral  ~ 2005  . Tonsillectomy  ~ 1950  . Esophagogastroduodenoscopy (egd) with propofol N/A 11/08/2014    Procedure: ESOPHAGOGASTRODUODENOSCOPY (EGD) WITH PROPOFOL;  Surgeon: Garlan Fair, MD;  Location: [facility];  Service: Endoscopy;  Laterality: N/A;  . Joint replacement    . Dilation  and curettage of uterus      FAMILY HISTORY Family History  Problem Relation Age of Onset  . Heart attack Mother 1    Died age 30  . Heart disease Brother     Atrial fib  Patient's father died age 59 with prostate cancer; mother died atv56 with an MI. One brother, alive at 42; one sister with parkinson's. No blood or cancer problems otherwise in family  GYNECOLOGIC HISTORY:  No LMP recorded. Patient is postmenopausal. Menarche age 68, first live birth age 46, she is GXP3; menopause age 47, no HR  SOCIAL HISTORY:  Grade school Pharmacist, hospital, retired; husband was a Higher education careers adviser. He has severe heart disease and she is his main caretaker. Son [name] works for [employer] and sings in the [employer]; daughter [name] lives in [place], a homemaker; daughter [name] works at the surgical center in [employer] as an Therapist, sports. The patient has 7 gch. She is a Psychologist, forensic    ADVANCED DIRECTIVES: in place; the patient's son [name] is her Idabel: Social History  Substance Use Topics  . Smoking status: Never Smoker   . Smokeless tobacco: Never Used  . Alcohol Use: No     Colonoscopy:  PAP:  Bone density:  Lipid panel:  Allergies  Allergen Reactions  . Cozaar [Losartan] Other (See Comments)    Cause pt to lose her taste for foods  . Codeine Itching and Other (See Comments)    Makes feel crazy  PT STATES SHE ALSO CAN NOT TAKE THE SYNTHETIC CODEINES  . Hydrocodone Itching    Tolerates with benadryl  . Oxycodone Itching  . Tetanus Toxoids Swelling and Rash    Current Outpatient Prescriptions  Medication Sig Dispense Refill  . amLODipine (NORVASC) 2.5 MG tablet Take 1 tablet (2.5 mg total) by mouth daily. 90 tablet 3  . Cyanocobalamin 1000 MCG/ML KIT Inject 1,000 mcg as directed every 30 (thirty) days. B 12 shot    . hydrochlorothiazide (HYDRODIURIL) 25 MG tablet     . Melatonin 5 MG TABS Take 2.5 mg by mouth at bedtime.    . Multiple Vitamin (MULTIVITAMIN WITH MINERALS) TABS tablet  Take 1 tablet by mouth daily.    . pantoprazole (PROTONIX) 40 MG tablet Take 40 mg by mouth every morning.    . valACYclovir (VALTREX) 1000 MG tablet Take 1,000 mg by mouth 2 (two) times daily.     . Venlafaxine HCl 150 MG TB24 TAKE ONE TABLET BY MOUTH ONCE DAILY 30 tablet 0  . Vitamin D, Ergocalciferol, (DRISDOL) 50000 UNITS CAPS capsule Take 50,000 Units by mouth See admin instructions. Every other week.    . magic mouthwash SOLN 5-10 ML Q 6 HOURS - MAY SWISH SPIT OR SWALLOW (Patient not taking: Reported on 07/12/2015) 240 mL 1  . ondansetron (ZOFRAN) 8 MG tablet Take 1 tablet (8 mg total) by mouth 2 (two) times daily as needed for nausea or vomiting. (Patient not taking: Reported on 07/12/2015) 30 tablet 1  . prochlorperazine (COMPAZINE) 10 MG  tablet Take 1 tablet (10 mg total) by mouth every 6 (six) hours as needed for nausea or vomiting. (Patient not taking: Reported on 07/12/2015) 30 tablet 1  . tranexamic acid (LYSTEDA) 650 MG TABS tablet Take 2 tablets (1,300 mg total) by mouth every morning. (Patient not taking: Reported on 07/12/2015) 60 tablet 6   No current facility-administered medications for this visit.   Facility-Administered Medications Ordered in Other Visits  Medication Dose Route Frequency Provider Last Rate Last Dose  . azaCITIDine (VIDAZA) chemo injection 140 mg  75 mg/m2 (Treatment Plan Actual) Subcutaneous Once [name], MD      . ondansetron [facility] tablet 8 mg  8 mg Oral Once [name], MD        OBJECTIVE: Older white woman in no acute distress There were no vitals filed for this visit.   There is no weight on file to calculate BMI.    ECOG FS:0 - Asymptomatic  Skin: warm, dry  HEENT: sclerae anicteric, conjunctivae pink, oropharynx clear. No thrush or mucositis.  Lymph Nodes: No cervical or supraclavicular lymphadenopathy  Lungs: clear to auscultation bilaterally, no rales, wheezes, or rhonci  Heart: regular rate and rhythm  Abdomen: round, soft,  non tender, positive bowel sounds  Musculoskeletal: No focal spinal tenderness, no peripheral edema  Neuro: non focal, well oriented, positive affect  LAB RESULTS:  CMP     Component Value Date/Time   NA 135* 07/10/2015 1317   NA 138 05/25/2015 0332   K 3.4* 07/10/2015 1317   K 4.1 05/25/2015 0332   CL 104 05/25/2015 0332   CO2 25 07/10/2015 1317   CO2 28 05/25/2015 0332   GLUCOSE 143* 07/10/2015 1317   GLUCOSE 96 05/25/2015 0332   BUN 18.3 07/10/2015 1317   BUN 17 05/25/2015 0332   CREATININE 1.1 07/10/2015 1317   CREATININE 0.93 05/25/2015 0332   CALCIUM 8.9 07/10/2015 1317   CALCIUM 8.8* 05/25/2015 0332   PROT 8.7* 07/10/2015 1317   PROT 6.3* 05/24/2015 0415   ALBUMIN 3.2* 07/10/2015 1317   ALBUMIN 3.4* 05/24/2015 0415   AST 19 07/10/2015 1317   AST 15 05/24/2015 0415   ALT 14 07/10/2015 1317   ALT 11* 05/24/2015 0415   ALKPHOS 62 07/10/2015 1317   ALKPHOS 56 05/24/2015 0415   BILITOT 0.59 07/10/2015 1317   BILITOT 1.3* 05/24/2015 0415   GFRNONAA 58* 05/25/2015 0332   GFRAA >60 05/25/2015 0332    INo results found for: SPEP, UPEP  Lab Results  Component Value Date   WBC 4.9 07/12/2015   NEUTROABS 2.3 07/12/2015   HGB 10.8* 07/12/2015   HCT 32.1* 07/12/2015   MCV 93.0 07/12/2015   PLT 122* 07/12/2015      Chemistry      Component Value Date/Time   NA 135* 07/10/2015 1317   NA 138 05/25/2015 0332   K 3.4* 07/10/2015 1317   K 4.1 05/25/2015 0332   CL 104 05/25/2015 0332   CO2 25 07/10/2015 1317   CO2 28 05/25/2015 0332   BUN 18.3 07/10/2015 1317   BUN 17 05/25/2015 0332   CREATININE 1.1 07/10/2015 1317   CREATININE 0.93 05/25/2015 0332      Component Value Date/Time   CALCIUM 8.9 07/10/2015 1317   CALCIUM 8.8* 05/25/2015 0332   ALKPHOS 62 07/10/2015 1317   ALKPHOS 56 05/24/2015 0415   AST 19 07/10/2015 1317   AST 15 05/24/2015 0415   ALT 14 07/10/2015 1317   ALT 11* 05/24/2015 0415  BILITOT 0.59 07/10/2015 1317   BILITOT 1.3* 05/24/2015  0415       No results found for: LABCA2  No components found for: CHYIF027  No results for input(s): INR in the last 168 hours.  Urinalysis    Component Value Date/Time   COLORURINE YELLOW 04/21/2012 0946   APPEARANCEUR CLOUDY* 04/21/2012 0946   LABSPEC 1.019 04/21/2012 0946   PHURINE 7.0 04/21/2012 0946   GLUCOSEU NEGATIVE 04/21/2012 0946   HGBUR NEGATIVE 04/21/2012 0946   BILIRUBINUR NEGATIVE 04/21/2012 0946   KETONESUR NEGATIVE 04/21/2012 0946   PROTEINUR NEGATIVE 04/21/2012 0946   UROBILINOGEN 1.0 04/21/2012 0946   NITRITE NEGATIVE 04/21/2012 0946   LEUKOCYTESUR NEGATIVE 04/21/2012 0946    STUDIES: No results found.  ASSESSMENT: 80 y.o. [place] woman with acute myeloid leukemia diagnosed by bone marrow biopsy 05/25/2015, with the blast count between 17 and 30% depending on the method of determination.   (a) Cytogenetics pending  (b) consider allogenic transplant  (c) all transfusion products to be irradiated  (1) started sQ azacitidine 06/05/2015, receiving 7 doses every 28 day cycle   PLAN:  I reviewed the results of the CBC with Taunia and she is delighted to hear that her WBC, hgb, and platelet counts are all trending in the right direction. She is asymptomatically anemic and she has had no abnormal bruising or bleeding due to thrombocytopenia, while her wbc has actually achieved a normal value today. She will proceed with day 6, cycle 2 of azacitidine as planned.   We spent the majority of this appointment discussing symptom management. If she is able to get a handle on her constipation and vomiting, she feels she "would be in the clear." She is going to increase her use of sennakot to 2 tablets twice daily, and will go on miralax daily, with the goal of 1 soft formed stool every 3 days. If she goes a week without a bowel movement and becomes excessively bloated she will try 1/2 bottle of magnesium citrate, but she understands that this intervention is not to be  used frequently. For nausea and vomiting, she will take the zofran twice daily, regardless of experiencing nausea during the week of treatment. She will utilize compazine q6hrs as needed in between this.   She is staying hydrated for the most part, and she declined the offer for IV fluids today. She will begin a protein supplement to pad the lack of oral intake.   Jorene will complete cycle 2 of vidaza tomorrow. She will return on Monday for follow up with me. She understands and agrees with this plan. She has been encouraged to call with any issues that might arise before her next visit here.   Laurie Panda, NP   07/12/2015 10:41 AM

## 2015-07-12 NOTE — Patient Instructions (Signed)
[facility] Discharge Instructions for Patients Receiving Chemotherapy  Today you received the following chemotherapy agents Vidaza  To help prevent nausea and vomiting after your treatment, we encourage you to take your nausea medication   If you develop nausea and vomiting that is not controlled by your nausea medication, call the clinic.   BELOW ARE SYMPTOMS THAT SHOULD BE REPORTED IMMEDIATELY:  *FEVER GREATER THAN 100.5 F  *CHILLS WITH OR WITHOUT FEVER  NAUSEA AND VOMITING THAT IS NOT CONTROLLED WITH YOUR NAUSEA MEDICATION  *UNUSUAL SHORTNESS OF BREATH  *UNUSUAL BRUISING OR BLEEDING  TENDERNESS IN MOUTH AND THROAT WITH OR WITHOUT PRESENCE OF ULCERS  *URINARY PROBLEMS  *BOWEL PROBLEMS  UNUSUAL RASH Items with * indicate a potential emergency and should be followed up as soon as possible.  Feel free to call the clinic you have any questions or concerns. The clinic phone number is [phone].  Please show the CHEMO ALERT CARD at check-in to the Emergency Department and triage nurse.   

## 2015-07-13 ENCOUNTER — Ambulatory Visit (HOSPITAL_BASED_OUTPATIENT_CLINIC_OR_DEPARTMENT_OTHER): Payer: Medicare Other

## 2015-07-13 DIAGNOSIS — C9200 Acute myeloblastic leukemia, not having achieved remission: Secondary | ICD-10-CM | POA: Diagnosis not present

## 2015-07-13 DIAGNOSIS — Z5111 Encounter for antineoplastic chemotherapy: Secondary | ICD-10-CM

## 2015-07-13 DIAGNOSIS — D61818 Other pancytopenia: Secondary | ICD-10-CM

## 2015-07-13 MED ORDER — AZACITIDINE CHEMO SQ INJECTION
75.0000 mg/m2 | Freq: Once | INTRAMUSCULAR | Status: AC
  Administered 2015-07-13: 140 mg via SUBCUTANEOUS
  Filled 2015-07-13: qty 5.6

## 2015-07-13 MED ORDER — ONDANSETRON HCL 8 MG PO TABS
8.0000 mg | ORAL_TABLET | Freq: Once | ORAL | Status: AC
  Administered 2015-07-13: 8 mg via ORAL

## 2015-07-13 MED ORDER — ONDANSETRON HCL 8 MG PO TABS
ORAL_TABLET | ORAL | Status: AC
  Filled 2015-07-13: qty 1

## 2015-07-13 NOTE — Patient Instructions (Signed)
[facility] Discharge Instructions for Patients Receiving Chemotherapy  Today you received the following chemotherapy agents Vidaza  To help prevent nausea and vomiting after your treatment, we encourage you to take your nausea medication as prescribed.    If you develop nausea and vomiting that is not controlled by your nausea medication, call the clinic.   BELOW ARE SYMPTOMS THAT SHOULD BE REPORTED IMMEDIATELY:  *FEVER GREATER THAN 100.5 F  *CHILLS WITH OR WITHOUT FEVER  NAUSEA AND VOMITING THAT IS NOT CONTROLLED WITH YOUR NAUSEA MEDICATION  *UNUSUAL SHORTNESS OF BREATH  *UNUSUAL BRUISING OR BLEEDING  TENDERNESS IN MOUTH AND THROAT WITH OR WITHOUT PRESENCE OF ULCERS  *URINARY PROBLEMS  *BOWEL PROBLEMS  UNUSUAL RASH Items with * indicate a potential emergency and should be followed up as soon as possible.  Feel free to call the clinic you have any questions or concerns. The clinic phone number is [phone].  Please show the CHEMO ALERT CARD at check-in to the Emergency Department and triage nurse.   

## 2015-07-17 ENCOUNTER — Encounter: Payer: Self-pay | Admitting: Nurse Practitioner

## 2015-07-17 ENCOUNTER — Other Ambulatory Visit (HOSPITAL_BASED_OUTPATIENT_CLINIC_OR_DEPARTMENT_OTHER): Payer: Medicare Other

## 2015-07-17 ENCOUNTER — Other Ambulatory Visit: Payer: Self-pay | Admitting: Oncology

## 2015-07-17 ENCOUNTER — Ambulatory Visit (HOSPITAL_BASED_OUTPATIENT_CLINIC_OR_DEPARTMENT_OTHER): Payer: Medicare Other | Admitting: Nurse Practitioner

## 2015-07-17 VITALS — BP 161/71 | HR 73 | Temp 98.3°F | Resp 18 | Ht 68.0 in | Wt 155.2 lb

## 2015-07-17 DIAGNOSIS — C9200 Acute myeloblastic leukemia, not having achieved remission: Secondary | ICD-10-CM

## 2015-07-17 DIAGNOSIS — D649 Anemia, unspecified: Secondary | ICD-10-CM | POA: Diagnosis not present

## 2015-07-17 DIAGNOSIS — G629 Polyneuropathy, unspecified: Secondary | ICD-10-CM | POA: Diagnosis not present

## 2015-07-17 DIAGNOSIS — D61818 Other pancytopenia: Secondary | ICD-10-CM

## 2015-07-17 LAB — CBC WITH DIFFERENTIAL/PLATELET
BASO%: 0.6 % (ref 0.0–2.0)
BASOS ABS: 0 10*3/uL (ref 0.0–0.1)
EOS%: 0.7 % (ref 0.0–7.0)
Eosinophils Absolute: 0 10*3/uL (ref 0.0–0.5)
HEMATOCRIT: 30.7 % — AB (ref 34.8–46.6)
HEMOGLOBIN: 10.2 g/dL — AB (ref 11.6–15.9)
LYMPH#: 1 10*3/uL (ref 0.9–3.3)
LYMPH%: 23.4 % (ref 14.0–49.7)
MCH: 31.5 pg (ref 25.1–34.0)
MCHC: 33.2 g/dL (ref 31.5–36.0)
MCV: 94.7 fL (ref 79.5–101.0)
MONO#: 0.4 10*3/uL (ref 0.1–0.9)
MONO%: 9.8 % (ref 0.0–14.0)
NEUT%: 65.5 % (ref 38.4–76.8)
NEUTROS ABS: 2.7 10*3/uL (ref 1.5–6.5)
Platelets: 213 10*3/uL (ref 145–400)
RBC: 3.24 10*6/uL — ABNORMAL LOW (ref 3.70–5.45)
RDW: 21.9 % — AB (ref 11.2–14.5)
WBC: 4.1 10*3/uL (ref 3.9–10.3)

## 2015-07-17 NOTE — Progress Notes (Signed)
[facility]  [phone] [phone]) [phone]     ID: [name] DOB: Feb 07, 1936  MR#: [id number]  TKW#:[id number]  Patient Care Team: [place], FNP as PCP - General (Family Medicine) PCP: [name], FNP, [name] GYN: SU:  OTHER MD: [name], [name], [name]  CHIEF COMPLAINT: Acute myeloid leukemia  CURRENT TREATMENT: Azacitidine, tranexamic acid  HISTORY OF PRESENT ILLNESS: From the 05/22/2025 consult note:  "The patient was evaluated at her PCP's office 05/22/2015 with a complaint of DOE worsening over several months. Labwork was obtained including a CBC, whioch showed WBC 1.8, platelets 14K, and Hb 5.9 with an MCV of 97.5. DDimer was 1.04. Accordingly the patient was referred to the ED and was admitted 05/23/2015.   Labwork since admission includes a repeat CBC with WBC 1.7, HB 5.4, MCV 100.6 and platelets 10K. Creat was 1.04 with GFR 50. Reticulocytes are not elevated with abs 39.3 (1.7%) and LDH is normal at 188. Other labs are pending.  The patient tells me because of peripheral neuropathy she has been receiving monthly B-12 shots since 2005."  Bone marrow biopsy 05/25/2015 showed acute myeloid leukemia, with 17% blasts by flow cytometry, 24% by aspirate counts, and 20-30% by immunohistochemistry ([id number], and [id number]). The blasts were positive for CD 117 and focally for MPO. There were also myelodysplasia related changes in all 3 cell lines.  The patient's subsequent history is as detailed below.  INTERVAL HISTORY: [name] returns today for follow-up of her myelodysplasia/acute myeloid leukemia, alone. Today is day 13, cycle 2 of azacitidine, given days 1 through 7 of each 28 day cycle.   REVIEW OF SYSTEMS: [name] feels like herself again this week. She took the advice on stool softeners and she feels "cleaned out" after 2 large bowel movements in the past week. She had almost a loose stool this morning  as well, and intends on cutting back on the constipation meds. Luckily she has had no nausea since Wednesday last week, but she finds it hard to keep up with water because of taste changes. Her energy levels surged this weekend and she was able to clean out her den. She denies fevers or chills. She has no mouth sores or rashes. A detailed review of systems is otherwise stable.  PAST MEDICAL HISTORY: Past Medical History  Diagnosis Date  . GERD (gastroesophageal reflux disease)   . Depression   . Hypertension   . Shortness of breath dyspnea     with exertion  . History of blood transfusion 05/23/2015    "Hgb 5.9"  . Anemia   . Arthritis     "hand joints; hips; back" (05/23/2015)  . Chronic lower back pain   . Anxiety     PAST SURGICAL HISTORY: Past Surgical History  Procedure Laterality Date  . Bone spur      R thigh  . Total shoulder arthroplasty  09/13/2011    Procedure: TOTAL SHOULDER ARTHROPLASTY;  Surgeon: Augustin Schooling, MD;  Location: [facility];  Service: Orthopedics;  Laterality: Left;  LEFT SHOULDER REVERSED TOTAL SHOULDER ARTHROPLASTY  . Total knee arthroplasty  04/27/2012    Procedure: TOTAL KNEE ARTHROPLASTY;  Surgeon: Mauri Pole, MD;  Location: [facility];  Service: Orthopedics;  Laterality: Right;  . Cataract extraction w/ intraocular lens  implant, bilateral  ~ 2005  . Tonsillectomy  ~ 1950  . Esophagogastroduodenoscopy (egd) with propofol N/A 11/08/2014    Procedure: ESOPHAGOGASTRODUODENOSCOPY (EGD) WITH PROPOFOL;  Surgeon: Hassell Done  Sandria Senter, MD;  Location: [facility];  Service: Endoscopy;  Laterality: N/A;  . Joint replacement    . Dilation and curettage of uterus      FAMILY HISTORY Family History  Problem Relation Age of Onset  . Heart attack Mother 11    Died age 13  . Heart disease Brother     Atrial fib  Patient's father died age 57 with prostate cancer; mother died atv11 with an MI. One brother, alive at 21; one sister with parkinson's. No blood or cancer  problems otherwise in family  GYNECOLOGIC HISTORY:  No LMP recorded. Patient is postmenopausal. Menarche age 89, first live birth age 62, she is GXP3; menopause age 63, no HR  SOCIAL HISTORY:  Grade school Pharmacist, hospital, retired; husband was a Higher education careers adviser. He has severe heart disease and she is his main caretaker. Son [name] works for [employer] and sings in the [employer]; daughter [place] lives in [place], a homemaker; daughter [name] works at the surgical center in [employer] as an Therapist, sports. The patient has 7 gch. She is a Psychologist, forensic    ADVANCED DIRECTIVES: in place; the patient's son [name] is her [place]: Social History  Substance Use Topics  . Smoking status: Never Smoker   . Smokeless tobacco: Never Used  . Alcohol Use: No     Colonoscopy:  PAP:  Bone density:  Lipid panel:  Allergies  Allergen Reactions  . Cozaar [Losartan] Other (See Comments)    Cause pt to lose her taste for foods  . Codeine Itching and Other (See Comments)    Makes feel crazy  PT STATES SHE ALSO CAN NOT TAKE THE SYNTHETIC CODEINES  . Hydrocodone Itching    Tolerates with benadryl  . Oxycodone Itching  . Tetanus Toxoids Swelling and Rash    Current Outpatient Prescriptions  Medication Sig Dispense Refill  . amLODipine (NORVASC) 2.5 MG tablet Take 1 tablet (2.5 mg total) by mouth daily. 90 tablet 3  . Cyanocobalamin 1000 MCG/ML KIT Inject 1,000 mcg as directed every 30 (thirty) days. B 12 shot    . hydrochlorothiazide (HYDRODIURIL) 25 MG tablet     . Melatonin 5 MG TABS Take 2.5 mg by mouth at bedtime.    . Multiple Vitamin (MULTIVITAMIN WITH MINERALS) TABS tablet Take 1 tablet by mouth daily.    . ondansetron (ZOFRAN) 8 MG tablet Take 1 tablet (8 mg total) by mouth 2 (two) times daily as needed for nausea or vomiting. 30 tablet 1  . pantoprazole (PROTONIX) 40 MG tablet Take 40 mg by mouth every morning.    . valACYclovir (VALTREX) 1000 MG tablet Take 1,000 mg by mouth 2 (two) times daily.      . Venlafaxine HCl 150 MG TB24 TAKE ONE TABLET BY MOUTH ONCE DAILY 30 tablet 0  . Vitamin D, Ergocalciferol, (DRISDOL) 50000 UNITS CAPS capsule Take 50,000 Units by mouth See admin instructions. Every other week.    . magic mouthwash SOLN 5-10 ML Q 6 HOURS - MAY SWISH SPIT OR SWALLOW (Patient not taking: Reported on 07/12/2015) 240 mL 1  . prochlorperazine (COMPAZINE) 10 MG tablet Take 1 tablet (10 mg total) by mouth every 6 (six) hours as needed for nausea or vomiting. (Patient not taking: Reported on 07/17/2015) 30 tablet 1  . tranexamic acid (LYSTEDA) 650 MG TABS tablet Take 2 tablets (1,300 mg total) by mouth every morning. (Patient not taking: Reported on 07/12/2015) 60 tablet 6   No current facility-administered medications  for this visit.    OBJECTIVE: Older white woman in no acute distress Filed Vitals:   07/17/15 1151  BP: 161/71  Pulse: 73  Temp: 98.3 F (36.8 C)  Resp: 18     Body mass index is 23.6 kg/(m^2).    ECOG FS:0 - Asymptomatic  Sclerae unicteric, pupils round and equal Oropharynx clear and moist-- no thrush or other lesions No cervical or supraclavicular adenopathy Lungs no rales or rhonchi Heart regular rate and rhythm Abd soft, nontender, positive bowel sounds MSK no focal spinal tenderness, no upper extremity lymphedema Neuro: nonfocal, well oriented, appropriate affect Breasts: deferred  LAB RESULTS:  CMP     Component Value Date/Time   NA 135* 07/10/2015 1317   NA 138 05/25/2015 0332   K 3.4* 07/10/2015 1317   K 4.1 05/25/2015 0332   CL 104 05/25/2015 0332   CO2 25 07/10/2015 1317   CO2 28 05/25/2015 0332   GLUCOSE 143* 07/10/2015 1317   GLUCOSE 96 05/25/2015 0332   BUN 18.3 07/10/2015 1317   BUN 17 05/25/2015 0332   CREATININE 1.1 07/10/2015 1317   CREATININE 0.93 05/25/2015 0332   CALCIUM 8.9 07/10/2015 1317   CALCIUM 8.8* 05/25/2015 0332   PROT 8.7* 07/10/2015 1317   PROT 6.3* 05/24/2015 0415   ALBUMIN 3.2* 07/10/2015 1317   ALBUMIN 3.4*  05/24/2015 0415   AST 19 07/10/2015 1317   AST 15 05/24/2015 0415   ALT 14 07/10/2015 1317   ALT 11* 05/24/2015 0415   ALKPHOS 62 07/10/2015 1317   ALKPHOS 56 05/24/2015 0415   BILITOT 0.59 07/10/2015 1317   BILITOT 1.3* 05/24/2015 0415   GFRNONAA 58* 05/25/2015 0332   GFRAA >60 05/25/2015 0332    INo results found for: SPEP, UPEP  Lab Results  Component Value Date   WBC 4.1 07/17/2015   NEUTROABS 2.7 07/17/2015   HGB 10.2* 07/17/2015   HCT 30.7* 07/17/2015   MCV 94.7 07/17/2015   PLT 213 07/17/2015      Chemistry      Component Value Date/Time   NA 135* 07/10/2015 1317   NA 138 05/25/2015 0332   K 3.4* 07/10/2015 1317   K 4.1 05/25/2015 0332   CL 104 05/25/2015 0332   CO2 25 07/10/2015 1317   CO2 28 05/25/2015 0332   BUN 18.3 07/10/2015 1317   BUN 17 05/25/2015 0332   CREATININE 1.1 07/10/2015 1317   CREATININE 0.93 05/25/2015 0332      Component Value Date/Time   CALCIUM 8.9 07/10/2015 1317   CALCIUM 8.8* 05/25/2015 0332   ALKPHOS 62 07/10/2015 1317   ALKPHOS 56 05/24/2015 0415   AST 19 07/10/2015 1317   AST 15 05/24/2015 0415   ALT 14 07/10/2015 1317   ALT 11* 05/24/2015 0415   BILITOT 0.59 07/10/2015 1317   BILITOT 1.3* 05/24/2015 0415       No results found for: LABCA2  No components found for: NIOEV035  No results for input(s): INR in the last 168 hours.  Urinalysis    Component Value Date/Time   COLORURINE YELLOW 04/21/2012 0946   APPEARANCEUR CLOUDY* 04/21/2012 0946   LABSPEC 1.019 04/21/2012 0946   PHURINE 7.0 04/21/2012 0946   GLUCOSEU NEGATIVE 04/21/2012 0946   HGBUR NEGATIVE 04/21/2012 0946   BILIRUBINUR NEGATIVE 04/21/2012 0946   KETONESUR NEGATIVE 04/21/2012 0946   PROTEINUR NEGATIVE 04/21/2012 0946   UROBILINOGEN 1.0 04/21/2012 0946   NITRITE NEGATIVE 04/21/2012 0946   LEUKOCYTESUR NEGATIVE 04/21/2012 0946    STUDIES: No  results found.  ASSESSMENT: 80 y.o. [place] woman with acute myeloid leukemia diagnosed by bone  marrow biopsy 05/25/2015, with the blast count between 17 and 30% depending on the method of determination.   (a) Cytogenetics pending  (b) consider allogenic transplant  (c) all transfusion products to be irradiated  (1) started sQ azacitidine 06/05/2015, receiving 7 doses every 28 day cycle   PLAN:  Kanesha has felt the best she has in months. The CBC was reviewed in detail and again shows improvement in her former pancytopenia. She is still anemic with an hgb of 10.2, but her platelets are finally normal and her WBC is still above 4. She rejoiced at this news.   Annayah's daughter will be in town from [place] this weekend through the next week, so Arnette would like to cancel her appointment on 1/30. She will return on 2/6 instead, with the start of cycle 3 of azacitidine slated for 2/13. She understands and agrees with this plan. She has been encouraged to call with any issues that might arise before her next visit here.    Laurie Panda, NP   07/17/2015 1:19 PM

## 2015-07-18 ENCOUNTER — Telehealth: Payer: Self-pay | Admitting: Oncology

## 2015-07-18 NOTE — Telephone Encounter (Signed)
Spoke with patient and she is aware of her 2/3 appointment

## 2015-07-21 ENCOUNTER — Other Ambulatory Visit: Payer: Self-pay

## 2015-07-21 MED ORDER — VITAMIN D (ERGOCALCIFEROL) 1.25 MG (50000 UNIT) PO CAPS: 50000.0000 [IU] | ORAL_CAPSULE | ORAL | Status: DC

## 2015-07-24 ENCOUNTER — Other Ambulatory Visit: Payer: Medicare Other

## 2015-07-24 ENCOUNTER — Ambulatory Visit: Payer: Medicare Other | Admitting: Nurse Practitioner

## 2015-07-25 ENCOUNTER — Ambulatory Visit: Payer: Medicare Other | Admitting: Oncology

## 2015-07-25 ENCOUNTER — Other Ambulatory Visit: Payer: Medicare Other

## 2015-07-28 ENCOUNTER — Ambulatory Visit (HOSPITAL_BASED_OUTPATIENT_CLINIC_OR_DEPARTMENT_OTHER): Payer: Medicare Other

## 2015-07-28 ENCOUNTER — Ambulatory Visit (HOSPITAL_BASED_OUTPATIENT_CLINIC_OR_DEPARTMENT_OTHER): Payer: Medicare Other | Admitting: Oncology

## 2015-07-28 ENCOUNTER — Telehealth: Payer: Self-pay | Admitting: Oncology

## 2015-07-28 ENCOUNTER — Telehealth: Payer: Self-pay | Admitting: *Deleted

## 2015-07-28 ENCOUNTER — Other Ambulatory Visit (HOSPITAL_BASED_OUTPATIENT_CLINIC_OR_DEPARTMENT_OTHER): Payer: Medicare Other

## 2015-07-28 VITALS — BP 135/65 | HR 72 | Temp 98.0°F | Resp 18 | Ht 68.0 in | Wt 151.7 lb

## 2015-07-28 DIAGNOSIS — C9200 Acute myeloblastic leukemia, not having achieved remission: Secondary | ICD-10-CM

## 2015-07-28 DIAGNOSIS — D61818 Other pancytopenia: Secondary | ICD-10-CM

## 2015-07-28 DIAGNOSIS — D693 Immune thrombocytopenic purpura: Secondary | ICD-10-CM | POA: Insufficient documentation

## 2015-07-28 DIAGNOSIS — D696 Thrombocytopenia, unspecified: Secondary | ICD-10-CM

## 2015-07-28 LAB — CBC WITH DIFFERENTIAL/PLATELET
BASO%: 1.1 % (ref 0.0–2.0)
Basophils Absolute: 0.1 10*3/uL (ref 0.0–0.1)
EOS ABS: 0.1 10*3/uL (ref 0.0–0.5)
EOS%: 2 % (ref 0.0–7.0)
HEMATOCRIT: 36.5 % (ref 34.8–46.6)
HEMOGLOBIN: 11.9 g/dL (ref 11.6–15.9)
LYMPH#: 1.3 10*3/uL (ref 0.9–3.3)
LYMPH%: 29.5 % (ref 14.0–49.7)
MCH: 32.2 pg (ref 25.1–34.0)
MCHC: 32.6 g/dL (ref 31.5–36.0)
MCV: 98.6 fL (ref 79.5–101.0)
MONO#: 0.4 10*3/uL (ref 0.1–0.9)
MONO%: 9.9 % (ref 0.0–14.0)
NEUT%: 57.5 % (ref 38.4–76.8)
NEUTROS ABS: 2.6 10*3/uL (ref 1.5–6.5)
Platelets: 225 10*3/uL (ref 145–400)
RBC: 3.7 10*6/uL (ref 3.70–5.45)
RDW: 21.2 % — AB (ref 11.2–14.5)
WBC: 4.4 10*3/uL (ref 3.9–10.3)

## 2015-07-28 MED ORDER — ROMIPLOSTIM 250 MCG ~~LOC~~ SOLR
60.0000 ug | Freq: Once | SUBCUTANEOUS | Status: AC
  Administered 2015-07-28: 60 ug via SUBCUTANEOUS
  Filled 2015-07-28: qty 0.12

## 2015-07-28 NOTE — Telephone Encounter (Signed)
Appointments made and avs printed °

## 2015-07-28 NOTE — Progress Notes (Signed)
[place]  Telephone:([phone]) [phone] Fax:(336) [phone]     ID: [name] DOB: 1936/03/09  MR#: [id number]  ZPH#:[id number]  Patient Care Team: [place], FNP as PCP - General (Family Medicine) PCP: [name], FNP, [name] GYN: SU:  OTHER MD: [name], [name], [name]  CHIEF COMPLAINT: Acute myeloid leukemia  CURRENT TREATMENT: Azacitidine, tranexamic acid  HISTORY OF PRESENT ILLNESS: From the 05/22/2025 consult note:  "The patient was evaluated at her PCP's office 05/22/2015 with a complaint of DOE worsening over several months. Labwork was obtained including a CBC, whioch showed WBC 1.8, platelets 14K, and Hb 5.9 with an MCV of 97.5. DDimer was 1.04. Accordingly the patient was referred to the ED and was admitted 05/23/2015.   Labwork since admission includes a repeat CBC with WBC 1.7, HB 5.4, MCV 100.6 and platelets 10K. Creat was 1.04 with GFR 50. Reticulocytes are not elevated with abs 39.3 (1.7%) and LDH is normal at 188. Other labs are pending.  The patient tells me because of peripheral neuropathy she has been receiving monthly B-12 shots since 2005."  Bone marrow biopsy 05/25/2015 showed acute myeloid leukemia, with 17% blasts by flow cytometry, 24% by aspirate counts, and 20-30% by immunohistochemistry ([id number], and 898). The blasts were positive for CD 117 and focally for MPO. There were also myelodysplasia related changes in all 3 cell lines.  The patient's subsequent history is as detailed below.  INTERVAL HISTORY: [name] returns today for follow-up of her myelodysplasia/acute myeloid leukemia. Today is say 23 cycle 2 of vidaza, which she receives sQ 7 days Q28d.  [name] had platelet counts less than 10 on January 3 and January 6. She received platelet transfusion both days, with no bump at all from the first transfusion and minimal bump from the second. On the possibility that she might have immune  thrombocytopenia complicating her MDS/AML, she received IVIG on January 12 and 13th, and [place] A-plus stem on January 13. Her platelet count rose steadily to 213 K on January 23, and remains at [place] today. She has not had a blood transfusion since 06/17/2015. Her hemoglobin today is back to normal.  REVIEW OF SYSTEMS: [name] had a wonderful week just ended, with the [employer] of her 78 month grandson, and her daughter from [place] visiting. Her husband is now in senior care because of some medical problems and that is her main concern. She does visit him most days, does drive, and also takes care of a couple of her grandchildren on a location. In terms of symptoms she has a little bit of a runny nose and a dry cough. There has been no fever, purulent sputum, or pleurisy. Sometimes her ankles swell. She has stress urinary incontinence which is long-standing. She has chronic low back pain which is not more intense or persistent than before. She feels a little bit depressed and a little bit forgetful. A detailed review of systems today was otherwise stable  PAST MEDICAL HISTORY: Past Medical History  Diagnosis Date  . GERD (gastroesophageal reflux disease)   . Depression   . Hypertension   . Shortness of breath dyspnea     with exertion  . History of blood transfusion 05/23/2015    "Hgb 5.9"  . Anemia   . Arthritis     "hand joints; hips; back" (05/23/2015)  . Chronic lower back pain   . Anxiety     PAST SURGICAL HISTORY: Past Surgical History  Procedure Laterality  Date  . Bone spur      R thigh  . Total shoulder arthroplasty  09/13/2011    Procedure: TOTAL SHOULDER ARTHROPLASTY;  Surgeon: Augustin Schooling, MD;  Location: [place];  Service: Orthopedics;  Laterality: Left;  LEFT SHOULDER REVERSED TOTAL SHOULDER ARTHROPLASTY  . Total knee arthroplasty  04/27/2012    Procedure: TOTAL KNEE ARTHROPLASTY;  Surgeon: Mauri Pole, MD;  Location: [facility];  Service: Orthopedics;  Laterality: Right;  .  Cataract extraction w/ intraocular lens  implant, bilateral  ~ 2005  . Tonsillectomy  ~ 1950  . Esophagogastroduodenoscopy (egd) with propofol N/A 11/08/2014    Procedure: ESOPHAGOGASTRODUODENOSCOPY (EGD) WITH PROPOFOL;  Surgeon: Garlan Fair, MD;  Location: [facility];  Service: Endoscopy;  Laterality: N/A;  . Joint replacement    . Dilation and curettage of uterus      FAMILY HISTORY Family History  Problem Relation Age of Onset  . Heart attack Mother 19    Died age 11  . Heart disease Brother     Atrial fib  Patient's father died age 60 with prostate cancer; mother died atv46 with an MI. One brother, alive at 51; one sister with parkinson's. No blood or cancer problems otherwise in family  GYNECOLOGIC HISTORY:  No LMP recorded. Patient is postmenopausal. Menarche age 20, first live birth age 54, she is GXP3; menopause age 80, no HR  SOCIAL HISTORY:  Grade school Pharmacist, hospital, retired; husband was a Higher education careers adviser. He has severe heart disease and she is his main caretaker. Son [name] works for [employer] and sings in the [employer]; daughter [name] lives in [place], a homemaker; daughter [name] works at the surgical center in [employer] as an Therapist, sports. The patient has 7 gch. She is a Psychologist, forensic    ADVANCED DIRECTIVES: in place; the patient's son [name] is her [place]: Social History  Substance Use Topics  . Smoking status: Never Smoker   . Smokeless tobacco: Never Used  . Alcohol Use: No     Colonoscopy:  PAP:  Bone density:  Lipid panel:  Allergies  Allergen Reactions  . Cozaar [Losartan] Other (See Comments)    Cause pt to lose her taste for foods  . Codeine Itching and Other (See Comments)    Makes feel crazy  PT STATES SHE ALSO CAN NOT TAKE THE SYNTHETIC CODEINES  . Hydrocodone Itching    Tolerates with benadryl  . Oxycodone Itching  . Tetanus Toxoids Swelling and Rash    Current Outpatient Prescriptions  Medication Sig Dispense Refill  . amLODipine  (NORVASC) 2.5 MG tablet Take 1 tablet (2.5 mg total) by mouth daily. 90 tablet 3  . Cyanocobalamin 1000 MCG/ML KIT Inject 1,000 mcg as directed every 30 (thirty) days. B 12 shot    . hydrochlorothiazide (HYDRODIURIL) 25 MG tablet     . magic mouthwash SOLN 5-10 ML Q 6 HOURS - MAY SWISH SPIT OR SWALLOW (Patient not taking: Reported on 07/12/2015) 240 mL 1  . Melatonin 5 MG TABS Take 2.5 mg by mouth at bedtime.    . Multiple Vitamin (MULTIVITAMIN WITH MINERALS) TABS tablet Take 1 tablet by mouth daily.    . ondansetron (ZOFRAN) 8 MG tablet Take 1 tablet (8 mg total) by mouth 2 (two) times daily as needed for nausea or vomiting. 30 tablet 1  . pantoprazole (PROTONIX) 40 MG tablet Take 40 mg by mouth every morning.    . prochlorperazine (COMPAZINE) 10 MG tablet Take 1 tablet (  10 mg total) by mouth every 6 (six) hours as needed for nausea or vomiting. (Patient not taking: Reported on 07/17/2015) 30 tablet 1  . tranexamic acid (LYSTEDA) 650 MG TABS tablet Take 2 tablets (1,300 mg total) by mouth every morning. (Patient not taking: Reported on 07/12/2015) 60 tablet 6  . valACYclovir (VALTREX) 1000 MG tablet Take 1,000 mg by mouth 2 (two) times daily.     . Venlafaxine HCl 150 MG TB24 TAKE ONE TABLET BY MOUTH ONCE DAILY 30 tablet 0  . Vitamin D, Ergocalciferol, (DRISDOL) 50000 units CAPS capsule Take 1 capsule (50,000 Units total) by mouth See admin instructions. Every other week. 30 capsule 1   No current facility-administered medications for this visit.    OBJECTIVE: Older white woman who appears well Filed Vitals:   07/28/15 1048  BP: 135/65  Pulse: 72  Temp: 98 F (36.7 C)  Resp: 18     Body mass index is 23.07 kg/(m^2).    ECOG FS:1 - Symptomatic but completely ambulatory  Sclerae unicteric, EOMs intact Oropharynx clear and moist, no thrush or other lesions No cervical or supraclavicular adenopathy Lungs no rales or rhonchi, good excursion bilaterally Heart regular rate and rhythm Abd soft,  nontender, positive bowel sounds MSK no focal spinal tenderness, no upper extremity lymphedema Neuro: nonfocal, well oriented, positive affect Breasts: Deferred   LAB RESULTS:  CMP     Component Value Date/Time   NA 135* 07/10/2015 1317   NA 138 05/25/2015 0332   K 3.4* 07/10/2015 1317   K 4.1 05/25/2015 0332   CL 104 05/25/2015 0332   CO2 25 07/10/2015 1317   CO2 28 05/25/2015 0332   GLUCOSE 143* 07/10/2015 1317   GLUCOSE 96 05/25/2015 0332   BUN 18.3 07/10/2015 1317   BUN 17 05/25/2015 0332   CREATININE 1.1 07/10/2015 1317   CREATININE 0.93 05/25/2015 0332   CALCIUM 8.9 07/10/2015 1317   CALCIUM 8.8* 05/25/2015 0332   PROT 8.7* 07/10/2015 1317   PROT 6.3* 05/24/2015 0415   ALBUMIN 3.2* 07/10/2015 1317   ALBUMIN 3.4* 05/24/2015 0415   AST 19 07/10/2015 1317   AST 15 05/24/2015 0415   ALT 14 07/10/2015 1317   ALT 11* 05/24/2015 0415   ALKPHOS 62 07/10/2015 1317   ALKPHOS 56 05/24/2015 0415   BILITOT 0.59 07/10/2015 1317   BILITOT 1.3* 05/24/2015 0415   GFRNONAA 58* 05/25/2015 0332   GFRAA >60 05/25/2015 0332    INo results found for: SPEP, UPEP  Lab Results  Component Value Date   WBC 4.4 07/28/2015   NEUTROABS 2.6 07/28/2015   HGB 11.9 07/28/2015   HCT 36.5 07/28/2015   MCV 98.6 07/28/2015   PLT 225 07/28/2015      Chemistry      Component Value Date/Time   NA 135* 07/10/2015 1317   NA 138 05/25/2015 0332   K 3.4* 07/10/2015 1317   K 4.1 05/25/2015 0332   CL 104 05/25/2015 0332   CO2 25 07/10/2015 1317   CO2 28 05/25/2015 0332   BUN 18.3 07/10/2015 1317   BUN 17 05/25/2015 0332   CREATININE 1.1 07/10/2015 1317   CREATININE 0.93 05/25/2015 0332      Component Value Date/Time   CALCIUM 8.9 07/10/2015 1317   CALCIUM 8.8* 05/25/2015 0332   ALKPHOS 62 07/10/2015 1317   ALKPHOS 56 05/24/2015 0415   AST 19 07/10/2015 1317   AST 15 05/24/2015 0415   ALT 14 07/10/2015 1317   ALT 11* 05/24/2015 0415  BILITOT 0.59 07/10/2015 1317   BILITOT 1.3*  05/24/2015 0415       No results found for: LABCA2  No components found for: [id number]  No results for input(s): INR in the last 168 hours.  Urinalysis    Component Value Date/Time   COLORURINE YELLOW 04/21/2012 0946   APPEARANCEUR CLOUDY* 04/21/2012 0946   LABSPEC 1.019 04/21/2012 0946   PHURINE 7.0 04/21/2012 0946   GLUCOSEU NEGATIVE 04/21/2012 0946   HGBUR NEGATIVE 04/21/2012 0946   BILIRUBINUR NEGATIVE 04/21/2012 0946   KETONESUR NEGATIVE 04/21/2012 0946   PROTEINUR NEGATIVE 04/21/2012 0946   UROBILINOGEN 1.0 04/21/2012 0946   NITRITE NEGATIVE 04/21/2012 0946   LEUKOCYTESUR NEGATIVE 04/21/2012 0946    STUDIES: No results found.  ASSESSMENT: 80 y.o. [place] woman with acute myeloid leukemia diagnosed by bone marrow biopsy 05/25/2015, with the blast count between 17 and 30% depending on the method of determination.   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, with gain of 2 and 19  (b) consider allogenic transplant  (c) all transfusion products to be irradiated  (1) started sQ azacitidine 06/05/2015, receiving 7 doses every 28 day cycle  (2) immune thrombocytopenic purpura  (a) s/p IVIG 07/06/2015 (1g/kg x 2d) with excellent response  (b) romiplostim started 07/07/2015   PLAN:  Meryl has had an excellent response to her ITP therapy, and this has allowed her hemoglobin to catch up, so currently she is not anemic. We are going to continue the romplostim weekly as per standard of care for her ITP. She will receive a reduced dose today.  Her third cycle of azacitidine is due to start 08/07/2015. She will see me that day and of course we are going to continue to check her counts on a weekly basis. At this point a possible plan would be to continue the azacitidine for 6 cycles, repeat a bone marrow biopsy, and consider switching to lenalidomide or thalidomide for maintenance depending on results. She will be seeing Dr. Nadara Mustard again at [facility]  in mid March and I will be interested in her input at that time.  Bianco is very concerned about her husband, who is very unhappy where he is. Possibly he could be moved to a different SNF that is higher quality and closer to her home. I have asked our social workers to assist.  Kimbra knows to call for any problems that may develop before her next visit here.  [name], MD   07/28/2015 11:00 AM

## 2015-07-28 NOTE — Patient Instructions (Signed)
Romiplostim injection What is this medicine? ROMIPLOSTIM (roe mi PLOE stim) helps your body make more platelets. This medicine is used to treat low platelets caused by chronic idiopathic thrombocytopenic purpura (ITP). This medicine may be used for other purposes; ask your health care provider or pharmacist if you have questions. What should I tell my health care provider before I take this medicine? They need to know if you have any of these conditions: -cancer or myelodysplastic syndrome -low blood counts, like low white cell, platelet, or red cell counts -take medicines that treat or prevent blood clots -an unusual or allergic reaction to romiplostim, mannitol, other medicines, foods, dyes, or preservatives -pregnant or trying to get pregnant -breast-feeding How should I use this medicine? This medicine is for injection under the skin. It is given by a health care professional in a hospital or clinic setting. A special MedGuide will be given to you before your injection. Read this information carefully each time. Talk to your pediatrician regarding the use of this medicine in children. Special care may be needed. Overdosage: If you think you have taken too much of this medicine contact a poison control center or emergency room at once. NOTE: This medicine is only for you. Do not share this medicine with others. What if I miss a dose? It is important not to miss your dose. Call your doctor or health care professional if you are unable to keep an appointment. What may interact with this medicine? Interactions are not expected. This list may not describe all possible interactions. Give your health care provider a list of all the medicines, herbs, non-prescription drugs, or dietary supplements you use. Also tell them if you smoke, drink alcohol, or use illegal drugs. Some items may interact with your medicine. What should I watch for while using this medicine? Your condition will be monitored  carefully while you are receiving this medicine. Visit your prescriber or health care professional for regular checks on your progress and for the needed blood tests. It is important to keep all appointments. What side effects may I notice from receiving this medicine? Side effects that you should report to your doctor or health care professional as soon as possible: -allergic reactions like skin rash, itching or hives, swelling of the face, lips, or tongue -shortness of breath, chest pain, swelling in a leg -unusual bleeding or bruising Side effects that usually do not require medical attention (report to your doctor or health care professional if they continue or are bothersome): -dizziness -headache -muscle aches -pain in arms and legs -stomach pain -trouble sleeping This list may not describe all possible side effects. Call your doctor for medical advice about side effects. You may report side effects to FDA at 1-800-FDA-1088. Where should I keep my medicine? This drug is given in a hospital or clinic and will not be stored at home. NOTE: This sheet is a summary. It may not cover all possible information. If you have questions about this medicine, talk to your doctor, pharmacist, or health care provider.    2016, Elsevier/Gold Standard. (2008-02-08 15:13:04)  

## 2015-07-28 NOTE — Telephone Encounter (Signed)
Faxed requested records to Dr Nadara Mustard at [facility] at [phone].

## 2015-07-28 NOTE — Progress Notes (Signed)
I have reviewed and agree with the plan. 

## 2015-08-03 ENCOUNTER — Encounter: Payer: Self-pay | Admitting: *Deleted

## 2015-08-03 NOTE — Progress Notes (Signed)
[place] Work  Clinical Social Work was referred by Futures trader for assessment of psychosocial needs due to caregiver concerns of pt's husband.  Clinical Social Worker attempted to contact pt to offer support and assess for needs.  CSW left vm for pt, but it appears pt has received assistance from CSW at pt's husband's HF clinic. This is appropriate as he is their pt. CSW left supportive message for pt, encouraging her to return CSW call.   Loren Racer, [place] Worker [place]  [place] Phone: [phone] Fax: [phone]

## 2015-08-04 ENCOUNTER — Ambulatory Visit (HOSPITAL_BASED_OUTPATIENT_CLINIC_OR_DEPARTMENT_OTHER): Payer: Medicare Other

## 2015-08-04 ENCOUNTER — Encounter: Payer: Self-pay | Admitting: *Deleted

## 2015-08-04 ENCOUNTER — Other Ambulatory Visit (HOSPITAL_BASED_OUTPATIENT_CLINIC_OR_DEPARTMENT_OTHER): Payer: Medicare Other

## 2015-08-04 VITALS — BP 160/72 | HR 77 | Temp 98.5°F

## 2015-08-04 DIAGNOSIS — C9200 Acute myeloblastic leukemia, not having achieved remission: Secondary | ICD-10-CM | POA: Diagnosis not present

## 2015-08-04 DIAGNOSIS — D696 Thrombocytopenia, unspecified: Secondary | ICD-10-CM

## 2015-08-04 DIAGNOSIS — D693 Immune thrombocytopenic purpura: Secondary | ICD-10-CM | POA: Diagnosis not present

## 2015-08-04 DIAGNOSIS — D61818 Other pancytopenia: Secondary | ICD-10-CM

## 2015-08-04 LAB — CBC WITH DIFFERENTIAL/PLATELET
BASO%: 3.6 % — ABNORMAL HIGH (ref 0.0–2.0)
Basophils Absolute: 0.2 10*3/uL — ABNORMAL HIGH (ref 0.0–0.1)
EOS ABS: 0.1 10*3/uL (ref 0.0–0.5)
EOS%: 1.8 % (ref 0.0–7.0)
HEMATOCRIT: 36.8 % (ref 34.8–46.6)
HEMOGLOBIN: 12.1 g/dL (ref 11.6–15.9)
LYMPH%: 24.4 % (ref 14.0–49.7)
MCH: 32.4 pg (ref 25.1–34.0)
MCHC: 32.9 g/dL (ref 31.5–36.0)
MCV: 98.5 fL (ref 79.5–101.0)
MONO#: 0.3 10*3/uL (ref 0.1–0.9)
MONO%: 5.5 % (ref 0.0–14.0)
NEUT%: 64.7 % (ref 38.4–76.8)
NEUTROS ABS: 3.2 10*3/uL (ref 1.5–6.5)
PLATELETS: 280 10*3/uL (ref 145–400)
RBC: 3.74 10*6/uL (ref 3.70–5.45)
RDW: 21.2 % — ABNORMAL HIGH (ref 11.2–14.5)
WBC: 4.9 10*3/uL (ref 3.9–10.3)
lymph#: 1.2 10*3/uL (ref 0.9–3.3)

## 2015-08-04 MED ORDER — ROMIPLOSTIM 250 MCG ~~LOC~~ SOLR
60.0000 ug | Freq: Once | SUBCUTANEOUS | Status: AC
  Administered 2015-08-04: 60 ug via SUBCUTANEOUS
  Filled 2015-08-04: qty 0.12

## 2015-08-04 NOTE — Progress Notes (Signed)
[place] Work  Clinical Social Work received return call from Pt. CSW reviewed community agencies to assist with pt's husband, ACE, etc. Pt plans to reach out to home care agencies as provided by HF clinic. She is also open to adult daycare for her husband based on cost. She feels this maybe helpful on days when she has chemo. Pt agrees to reach out if other needs arise.   Clinical Social Work interventions: Resource education  Loren Racer, [place] Worker [place]  [place] Phone: [phone] Fax: [phone]

## 2015-08-07 ENCOUNTER — Other Ambulatory Visit (HOSPITAL_BASED_OUTPATIENT_CLINIC_OR_DEPARTMENT_OTHER): Payer: Medicare Other

## 2015-08-07 ENCOUNTER — Ambulatory Visit (HOSPITAL_BASED_OUTPATIENT_CLINIC_OR_DEPARTMENT_OTHER): Payer: Medicare Other | Admitting: Oncology

## 2015-08-07 ENCOUNTER — Ambulatory Visit (HOSPITAL_BASED_OUTPATIENT_CLINIC_OR_DEPARTMENT_OTHER): Payer: Medicare Other

## 2015-08-07 ENCOUNTER — Other Ambulatory Visit: Payer: Self-pay | Admitting: Oncology

## 2015-08-07 VITALS — BP 138/66 | HR 80 | Temp 97.8°F | Resp 18 | Ht 68.0 in | Wt 151.9 lb

## 2015-08-07 DIAGNOSIS — C9200 Acute myeloblastic leukemia, not having achieved remission: Secondary | ICD-10-CM

## 2015-08-07 DIAGNOSIS — D696 Thrombocytopenia, unspecified: Secondary | ICD-10-CM

## 2015-08-07 DIAGNOSIS — D61818 Other pancytopenia: Secondary | ICD-10-CM

## 2015-08-07 DIAGNOSIS — Z5111 Encounter for antineoplastic chemotherapy: Secondary | ICD-10-CM

## 2015-08-07 DIAGNOSIS — D693 Immune thrombocytopenic purpura: Secondary | ICD-10-CM | POA: Diagnosis not present

## 2015-08-07 LAB — CBC WITH DIFFERENTIAL/PLATELET
BASO%: 1.8 % (ref 0.0–2.0)
Basophils Absolute: 0.1 10*3/uL (ref 0.0–0.1)
EOS ABS: 0.1 10*3/uL (ref 0.0–0.5)
EOS%: 2.5 % (ref 0.0–7.0)
HEMATOCRIT: 36.3 % (ref 34.8–46.6)
HEMOGLOBIN: 12.3 g/dL (ref 11.6–15.9)
LYMPH#: 1.4 10*3/uL (ref 0.9–3.3)
LYMPH%: 24.9 % (ref 14.0–49.7)
MCH: 32.8 pg (ref 25.1–34.0)
MCHC: 33.9 g/dL (ref 31.5–36.0)
MCV: 96.8 fL (ref 79.5–101.0)
MONO#: 0.3 10*3/uL (ref 0.1–0.9)
MONO%: 4.9 % (ref 0.0–14.0)
NEUT#: 3.6 10*3/uL (ref 1.5–6.5)
NEUT%: 65.9 % (ref 38.4–76.8)
PLATELETS: 414 10*3/uL — AB (ref 145–400)
RBC: 3.75 10*6/uL (ref 3.70–5.45)
RDW: 18.6 % — ABNORMAL HIGH (ref 11.2–14.5)
WBC: 5.5 10*3/uL (ref 3.9–10.3)
nRBC: 0 % (ref 0–0)

## 2015-08-07 LAB — COMPREHENSIVE METABOLIC PANEL
ALBUMIN: 3.5 g/dL (ref 3.5–5.0)
ALK PHOS: 64 U/L (ref 40–150)
ALT: 13 U/L (ref 0–55)
ANION GAP: 10 meq/L (ref 3–11)
AST: 21 U/L (ref 5–34)
BUN: 15.6 mg/dL (ref 7.0–26.0)
CALCIUM: 9.1 mg/dL (ref 8.4–10.4)
CO2: 26 mEq/L (ref 22–29)
CREATININE: 1 mg/dL (ref 0.6–1.1)
Chloride: 102 mEq/L (ref 98–109)
EGFR: 54 mL/min/{1.73_m2} — ABNORMAL LOW (ref >90)
Glucose: 103 mg/dl (ref 70–140)
POTASSIUM: 3.6 meq/L (ref 3.5–5.1)
Sodium: 137 mEq/L (ref 136–145)
Total Bilirubin: 0.62 mg/dL (ref 0.20–1.20)
Total Protein: 7.1 g/dL (ref 6.4–8.3)

## 2015-08-07 MED ORDER — ONDANSETRON HCL 8 MG PO TABS
ORAL_TABLET | ORAL | Status: AC
  Filled 2015-08-07: qty 1

## 2015-08-07 MED ORDER — AZACITIDINE CHEMO SQ INJECTION
75.0000 mg/m2 | Freq: Once | INTRAMUSCULAR | Status: AC
  Administered 2015-08-07: 140 mg via SUBCUTANEOUS
  Filled 2015-08-07: qty 5.6

## 2015-08-07 MED ORDER — ONDANSETRON HCL 8 MG PO TABS
8.0000 mg | ORAL_TABLET | Freq: Once | ORAL | Status: AC
  Administered 2015-08-07: 8 mg via ORAL

## 2015-08-07 NOTE — Progress Notes (Signed)
[place]  [phone] [phone]     ID: [name] DOB: 1935/12/31  MR#: [id number]  GUR#:[id number]  Patient Care Team: [facility], FNP as PCP - General (Family Medicine) PCP: [name], FNP, [name] GYN: SU:  OTHER MD: [name], [name], [name]  CHIEF COMPLAINT: Acute myeloid leukemia; ITP  CURRENT TREATMENT: Azacitidine, tranexamic acid  HISTORY OF PRESENT ILLNESS: From the 05/22/2025 consult note:  "The patient was evaluated at her PCP's office 05/22/2015 with a complaint of DOE worsening over several months. Labwork was obtained including a CBC, whioch showed WBC 1.8, platelets 14K, and Hb 5.9 with an MCV of 97.5. DDimer was 1.04. Accordingly the patient was referred to the ED and was admitted 05/23/2015.   Labwork since admission includes a repeat CBC with WBC 1.7, HB 5.4, MCV 100.6 and platelets 10K. Creat was 1.04 with GFR 50. Reticulocytes are not elevated with abs 39.3 (1.7%) and LDH is normal at 188. Other labs are pending.  The patient tells me because of peripheral neuropathy she has been receiving monthly B-12 shots since 2005."  Bone marrow biopsy 05/25/2015 showed acute myeloid leukemia, with 17% blasts by flow cytometry, 24% by aspirate counts, and 20-30% by immunohistochemistry ([id number], and [id number]). The blasts were positive for CD 117 and focally for MPO. There were also myelodysplasia related changes in all 3 cell lines.  The patient's subsequent history is as detailed below.  INTERVAL HISTORY: [name] returns today for follow-up of her myelodysplasia/acute myeloid leukemia.  Today is day 1 cycle 3 of azacitidine. She is also receiving rromiplostim weekly.  Since we treated her platelet count as ITP, her counts have completely normalized.   REVIEW OF SYSTEMS: [name] to [facility] this weekend and walk to lower the store without needing an electric cart. She has no problems  with shortness of breath or chest pain or pressure at present. She is very excited because her husband will be returning home from rehabilitation today. She is "ready for him". She still has some stress urinary incontinence and some low back pain at times. She feels anxious and depressed at times and a little forgetful. She is very encouraged however with the results of her treatment so far. She tolerates the azaacitidine  Without any unusual fatigue, rash, fever, or other complication aside from the discomfort of the shots themselves. Detailed review of systems today was otherwise  Noncontributory  PAST MEDICAL HISTORY: Past Medical History  Diagnosis Date  . GERD (gastroesophageal reflux disease)   . Depression   . Hypertension   . Shortness of breath dyspnea     with exertion  . History of blood transfusion 05/23/2015    "Hgb 5.9"  . Anemia   . Arthritis     "hand joints; hips; back" (05/23/2015)  . Chronic lower back pain   . Anxiety     PAST SURGICAL HISTORY: Past Surgical History  Procedure Laterality Date  . Bone spur      R thigh  . Total shoulder arthroplasty  09/13/2011    Procedure: TOTAL SHOULDER ARTHROPLASTY;  Surgeon: Augustin Schooling, MD;  Location: [name];  Service: Orthopedics;  Laterality: Left;  LEFT SHOULDER REVERSED TOTAL SHOULDER ARTHROPLASTY  . Total knee arthroplasty  04/27/2012    Procedure: TOTAL KNEE ARTHROPLASTY;  Surgeon: Mauri Pole, MD;  Location: [facility];  Service: Orthopedics;  Laterality: Right;  . Cataract extraction w/ intraocular lens  implant, bilateral  ~  2005  . Tonsillectomy  ~ 1950  . Esophagogastroduodenoscopy (egd) with propofol N/A 11/08/2014    Procedure: ESOPHAGOGASTRODUODENOSCOPY (EGD) WITH PROPOFOL;  Surgeon: Garlan Fair, MD;  Location: [facility];  Service: Endoscopy;  Laterality: N/A;  . Joint replacement    . Dilation and curettage of uterus      FAMILY HISTORY Family History  Problem Relation Age of Onset  . Heart attack  Mother 75    Died age 82  . Heart disease Brother     Atrial fib  Patient's father died age 49 with prostate cancer; mother died atv54 with an MI. One brother, alive at 19; one sister with parkinson's. No blood or cancer problems otherwise in family  GYNECOLOGIC HISTORY:  No LMP recorded. Patient is postmenopausal. Menarche age 36, first live birth age 6, she is GXP3; menopause age 71, no HR  SOCIAL HISTORY:  Grade school Pharmacist, hospital, retired; husband was a Higher education careers adviser. He has severe heart disease and she is his main caretaker. Son [name] works for [employer] and sings in the [employer]; daughter Valencia lives in [place], a homemaker; daughter [name] works at the surgical center in [employer] as an Therapist, sports. The patient has 7 gch. She is a Psychologist, forensic    ADVANCED DIRECTIVES: in place; the patient's son [name] is her Kinsley: Social History  Substance Use Topics  . Smoking status: Never Smoker   . Smokeless tobacco: Never Used  . Alcohol Use: No     Colonoscopy:  PAP:  Bone density:  Lipid panel:  Allergies  Allergen Reactions  . Cozaar [Losartan] Other (See Comments)    Cause pt to lose her taste for foods  . Codeine Itching and Other (See Comments)    Makes feel crazy  PT STATES SHE ALSO CAN NOT TAKE THE SYNTHETIC CODEINES  . Hydrocodone Itching    Tolerates with benadryl  . Oxycodone Itching  . Tetanus Toxoids Swelling and Rash    Current Outpatient Prescriptions  Medication Sig Dispense Refill  . amLODipine (NORVASC) 2.5 MG tablet Take 1 tablet (2.5 mg total) by mouth daily. 90 tablet 3  . Cyanocobalamin 1000 MCG/ML KIT Inject 1,000 mcg as directed every 30 (thirty) days. B 12 shot    . hydrochlorothiazide (HYDRODIURIL) 25 MG tablet     . magic mouthwash SOLN 5-10 ML Q 6 HOURS - MAY SWISH SPIT OR SWALLOW (Patient not taking: Reported on 07/12/2015) 240 mL 1  . Melatonin 5 MG TABS Take 2.5 mg by mouth at bedtime.    . Multiple Vitamin (MULTIVITAMIN WITH MINERALS)  TABS tablet Take 1 tablet by mouth daily.    . ondansetron (ZOFRAN) 8 MG tablet Take 1 tablet (8 mg total) by mouth 2 (two) times daily as needed for nausea or vomiting. 30 tablet 1  . pantoprazole (PROTONIX) 40 MG tablet Take 40 mg by mouth every morning.    . prochlorperazine (COMPAZINE) 10 MG tablet Take 1 tablet (10 mg total) by mouth every 6 (six) hours as needed for nausea or vomiting. (Patient not taking: Reported on 07/17/2015) 30 tablet 1  . tranexamic acid (LYSTEDA) 650 MG TABS tablet Take 2 tablets (1,300 mg total) by mouth every morning. (Patient not taking: Reported on 07/12/2015) 60 tablet 6  . valACYclovir (VALTREX) 1000 MG tablet Take 1,000 mg by mouth 2 (two) times daily.     . Venlafaxine HCl 150 MG TB24 TAKE ONE TABLET BY MOUTH ONCE DAILY 30 tablet 0  . Vitamin  D, Ergocalciferol, (DRISDOL) 50000 units CAPS capsule Take 1 capsule (50,000 Units total) by mouth See admin instructions. Every other week. 30 capsule 1   No current facility-administered medications for this visit.    OBJECTIVE: Older white woman  In no acute distress Filed Vitals:   08/07/15 1026  BP: 138/66  Pulse: 80  Temp: 97.8 F (36.6 C)  Resp: 18     Body mass index is 23.1 kg/(m^2).    ECOG FS:0 - Asymptomatic  Sclerae unicteric, pupils round and equal Oropharynx clear and moist-- no thrush or other lesions No cervical or supraclavicular adenopathy Lungs no rales or rhonchi Heart regular rate and rhythm Abd soft, nontender, positive bowel sounds MSK no focal spinal tenderness, no upper extremity lymphedema Neuro: nonfocal, well oriented, appropriate affect Breasts: Deferred  LAB RESULTS:  CMP     Component Value Date/Time   NA 135* 07/10/2015 1317   NA 138 05/25/2015 0332   K 3.4* 07/10/2015 1317   K 4.1 05/25/2015 0332   CL 104 05/25/2015 0332   CO2 25 07/10/2015 1317   CO2 28 05/25/2015 0332   GLUCOSE 143* 07/10/2015 1317   GLUCOSE 96 05/25/2015 0332   BUN 18.3 07/10/2015 1317   BUN  17 05/25/2015 0332   CREATININE 1.1 07/10/2015 1317   CREATININE 0.93 05/25/2015 0332   CALCIUM 8.9 07/10/2015 1317   CALCIUM 8.8* 05/25/2015 0332   PROT 8.7* 07/10/2015 1317   PROT 6.3* 05/24/2015 0415   ALBUMIN 3.2* 07/10/2015 1317   ALBUMIN 3.4* 05/24/2015 0415   AST 19 07/10/2015 1317   AST 15 05/24/2015 0415   ALT 14 07/10/2015 1317   ALT 11* 05/24/2015 0415   ALKPHOS 62 07/10/2015 1317   ALKPHOS 56 05/24/2015 0415   BILITOT 0.59 07/10/2015 1317   BILITOT 1.3* 05/24/2015 0415   GFRNONAA 58* 05/25/2015 0332   GFRAA >60 05/25/2015 0332    INo results found for: SPEP, UPEP  Lab Results  Component Value Date   WBC 5.5 08/07/2015   NEUTROABS 3.6 08/07/2015   HGB 12.3 08/07/2015   HCT 36.3 08/07/2015   MCV 96.8 08/07/2015   PLT 414* 08/07/2015      Chemistry      Component Value Date/Time   NA 135* 07/10/2015 1317   NA 138 05/25/2015 0332   K 3.4* 07/10/2015 1317   K 4.1 05/25/2015 0332   CL 104 05/25/2015 0332   CO2 25 07/10/2015 1317   CO2 28 05/25/2015 0332   BUN 18.3 07/10/2015 1317   BUN 17 05/25/2015 0332   CREATININE 1.1 07/10/2015 1317   CREATININE 0.93 05/25/2015 0332      Component Value Date/Time   CALCIUM 8.9 07/10/2015 1317   CALCIUM 8.8* 05/25/2015 0332   ALKPHOS 62 07/10/2015 1317   ALKPHOS 56 05/24/2015 0415   AST 19 07/10/2015 1317   AST 15 05/24/2015 0415   ALT 14 07/10/2015 1317   ALT 11* 05/24/2015 0415   BILITOT 0.59 07/10/2015 1317   BILITOT 1.3* 05/24/2015 0415       No results found for: LABCA2  No components found for: [id number]  No results for input(s): INR in the last 168 hours.  Urinalysis    Component Value Date/Time   COLORURINE YELLOW 04/21/2012 0946   APPEARANCEUR CLOUDY* 04/21/2012 0946   LABSPEC 1.019 04/21/2012 0946   PHURINE 7.0 04/21/2012 0946   GLUCOSEU NEGATIVE 04/21/2012 0946   HGBUR NEGATIVE 04/21/2012 0946   BILIRUBINUR NEGATIVE 04/21/2012 0946   KETONESUR NEGATIVE  04/21/2012 0946   PROTEINUR  NEGATIVE 04/21/2012 0946   UROBILINOGEN 1.0 04/21/2012 0946   NITRITE NEGATIVE 04/21/2012 0946   LEUKOCYTESUR NEGATIVE 04/21/2012 0946    STUDIES: No results found.  ASSESSMENT: 80 y.o. [place] woman with acute myeloid leukemia diagnosed by bone marrow biopsy 05/25/2015, with the blast count between 17 and 30% depending on the method of determination.   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, with gain of 2 and 19  (b) consider allogenic transplant  (c) all transfusion products to be irradiated  (1) started sQ azacitidine 06/05/2015, receiving 7 doses every 28 day cycle  (2) immune thrombocytopenic purpura  (a) s/p IVIG 07/06/2015 (1g/kg x 2d) with excellent response  (b) romiplostim first dose 07/07/2015, started weekly as of 08/04/2015   PLAN:  [name]  Is now more than 4 weeks out from her IVIG infusion. She is receiving romiplostim weekly as of 08/04/2015. She continues with an excellent platelet count, and her hemoglobin is in the normal range.     just as important, her white cell count is now in the normal range. Her blood film is being ready so I have not yet had a chance to look at it.   We are proceeding with her third cycle of azacytidine today. She tolerates this well , without any acute side effects. We are going to continue to check her counts at least on a weekly basis.    she has a follow-up appointment at [place] mid-March. We will consider repeating a bonemarrow biopsy before that visit  She knows to call for any problems that develop before her next visit here.     [name], MD   08/07/2015 10:35 AM

## 2015-08-07 NOTE — Patient Instructions (Signed)
[facility] Discharge Instructions for Patients Receiving Chemotherapy  Today you received the following chemotherapy agents Vidaza  To help prevent nausea and vomiting after your treatment, we encourage you to take your nausea medication   If you develop nausea and vomiting that is not controlled by your nausea medication, call the clinic.   BELOW ARE SYMPTOMS THAT SHOULD BE REPORTED IMMEDIATELY:  *FEVER GREATER THAN 100.5 F  *CHILLS WITH OR WITHOUT FEVER  NAUSEA AND VOMITING THAT IS NOT CONTROLLED WITH YOUR NAUSEA MEDICATION  *UNUSUAL SHORTNESS OF BREATH  *UNUSUAL BRUISING OR BLEEDING  TENDERNESS IN MOUTH AND THROAT WITH OR WITHOUT PRESENCE OF ULCERS  *URINARY PROBLEMS  *BOWEL PROBLEMS  UNUSUAL RASH Items with * indicate a potential emergency and should be followed up as soon as possible.  Feel free to call the clinic you have any questions or concerns. The clinic phone number is [phone].  Please show the CHEMO ALERT CARD at check-in to the Emergency Department and triage nurse.   

## 2015-08-08 ENCOUNTER — Ambulatory Visit (HOSPITAL_BASED_OUTPATIENT_CLINIC_OR_DEPARTMENT_OTHER): Payer: Medicare Other

## 2015-08-08 ENCOUNTER — Telehealth: Payer: Self-pay | Admitting: Oncology

## 2015-08-08 DIAGNOSIS — Z5111 Encounter for antineoplastic chemotherapy: Secondary | ICD-10-CM

## 2015-08-08 DIAGNOSIS — C9200 Acute myeloblastic leukemia, not having achieved remission: Secondary | ICD-10-CM | POA: Diagnosis not present

## 2015-08-08 DIAGNOSIS — D61818 Other pancytopenia: Secondary | ICD-10-CM

## 2015-08-08 MED ORDER — ONDANSETRON HCL 8 MG PO TABS
8.0000 mg | ORAL_TABLET | Freq: Once | ORAL | Status: AC
  Administered 2015-08-08: 8 mg via ORAL

## 2015-08-08 MED ORDER — ONDANSETRON HCL 8 MG PO TABS
ORAL_TABLET | ORAL | Status: AC
  Filled 2015-08-08: qty 1

## 2015-08-08 MED ORDER — AZACITIDINE CHEMO SQ INJECTION
75.0000 mg/m2 | Freq: Once | INTRAMUSCULAR | Status: AC
  Administered 2015-08-08: 140 mg via SUBCUTANEOUS
  Filled 2015-08-08: qty 5.6

## 2015-08-08 NOTE — Telephone Encounter (Signed)
Added f/u for 2/24 per 2/13 pof. Also adjusted 2/17 appointments inj should be after lab. Spoke with patient she is aware and will get new schedule tomorrow.

## 2015-08-08 NOTE — Patient Instructions (Signed)
[facility] Discharge Instructions for Patients Receiving Chemotherapy  Today you received the following chemotherapy agents Vidaza  To help prevent nausea and vomiting after your treatment, we encourage you to take your nausea medication   If you develop nausea and vomiting that is not controlled by your nausea medication, call the clinic.   BELOW ARE SYMPTOMS THAT SHOULD BE REPORTED IMMEDIATELY:  *FEVER GREATER THAN 100.5 F  *CHILLS WITH OR WITHOUT FEVER  NAUSEA AND VOMITING THAT IS NOT CONTROLLED WITH YOUR NAUSEA MEDICATION  *UNUSUAL SHORTNESS OF BREATH  *UNUSUAL BRUISING OR BLEEDING  TENDERNESS IN MOUTH AND THROAT WITH OR WITHOUT PRESENCE OF ULCERS  *URINARY PROBLEMS  *BOWEL PROBLEMS  UNUSUAL RASH Items with * indicate a potential emergency and should be followed up as soon as possible.  Feel free to call the clinic you have any questions or concerns. The clinic phone number is [phone].  Please show the CHEMO ALERT CARD at check-in to the Emergency Department and triage nurse.   

## 2015-08-09 ENCOUNTER — Ambulatory Visit (HOSPITAL_BASED_OUTPATIENT_CLINIC_OR_DEPARTMENT_OTHER): Payer: Medicare Other

## 2015-08-09 VITALS — BP 150/64 | HR 79 | Temp 98.4°F | Resp 16

## 2015-08-09 DIAGNOSIS — C9200 Acute myeloblastic leukemia, not having achieved remission: Secondary | ICD-10-CM | POA: Diagnosis not present

## 2015-08-09 DIAGNOSIS — D61818 Other pancytopenia: Secondary | ICD-10-CM

## 2015-08-09 DIAGNOSIS — Z5111 Encounter for antineoplastic chemotherapy: Secondary | ICD-10-CM

## 2015-08-09 MED ORDER — ONDANSETRON HCL 8 MG PO TABS
8.0000 mg | ORAL_TABLET | Freq: Once | ORAL | Status: AC
  Administered 2015-08-09: 8 mg via ORAL

## 2015-08-09 MED ORDER — AZACITIDINE CHEMO SQ INJECTION
75.0000 mg/m2 | Freq: Once | INTRAMUSCULAR | Status: AC
  Administered 2015-08-09: 140 mg via SUBCUTANEOUS
  Filled 2015-08-09: qty 5.6

## 2015-08-09 MED ORDER — ONDANSETRON HCL 8 MG PO TABS
ORAL_TABLET | ORAL | Status: AC
  Filled 2015-08-09: qty 1

## 2015-08-09 NOTE — Patient Instructions (Signed)
[facility] Discharge Instructions for Patients Receiving Chemotherapy  Today you received the following chemotherapy agents:  Vidaza  To help prevent nausea and vomiting after your treatment, we encourage you to take your nausea medication as ordered per MD.    If you develop nausea and vomiting that is not controlled by your nausea medication, call the clinic.   BELOW ARE SYMPTOMS THAT SHOULD BE REPORTED IMMEDIATELY:  *FEVER GREATER THAN 100.5 F  *CHILLS WITH OR WITHOUT FEVER  NAUSEA AND VOMITING THAT IS NOT CONTROLLED WITH YOUR NAUSEA MEDICATION  *UNUSUAL SHORTNESS OF BREATH  *UNUSUAL BRUISING OR BLEEDING  TENDERNESS IN MOUTH AND THROAT WITH OR WITHOUT PRESENCE OF ULCERS  *URINARY PROBLEMS  *BOWEL PROBLEMS  UNUSUAL RASH Items with * indicate a potential emergency and should be followed up as soon as possible.  Feel free to call the clinic you have any questions or concerns. The clinic phone number is [phone].  Please show the CHEMO ALERT CARD at check-in to the Emergency Department and triage nurse.   

## 2015-08-10 ENCOUNTER — Ambulatory Visit: Payer: Medicare Other

## 2015-08-10 VITALS — BP 156/77 | HR 76 | Temp 97.8°F | Resp 18

## 2015-08-10 DIAGNOSIS — D61818 Other pancytopenia: Secondary | ICD-10-CM

## 2015-08-10 DIAGNOSIS — C9200 Acute myeloblastic leukemia, not having achieved remission: Secondary | ICD-10-CM

## 2015-08-10 MED ORDER — AZACITIDINE CHEMO SQ INJECTION
75.0000 mg/m2 | Freq: Once | INTRAMUSCULAR | Status: DC
  Filled 2015-08-10: qty 5.6

## 2015-08-10 MED ORDER — ONDANSETRON HCL 8 MG PO TABS
8.0000 mg | ORAL_TABLET | Freq: Once | ORAL | Status: AC
  Administered 2015-08-10: 8 mg via ORAL

## 2015-08-10 MED ORDER — ONDANSETRON HCL 8 MG PO TABS
ORAL_TABLET | ORAL | Status: AC
  Filled 2015-08-10: qty 1

## 2015-08-10 NOTE — Patient Instructions (Signed)
[facility] Discharge Instructions for Patients Receiving Chemotherapy  Today you received the following chemotherapy agents:  Vidaza  To help prevent nausea and vomiting after your treatment, we encourage you to take your nausea medication as ordered per MD.    If you develop nausea and vomiting that is not controlled by your nausea medication, call the clinic.   BELOW ARE SYMPTOMS THAT SHOULD BE REPORTED IMMEDIATELY:  *FEVER GREATER THAN 100.5 F  *CHILLS WITH OR WITHOUT FEVER  NAUSEA AND VOMITING THAT IS NOT CONTROLLED WITH YOUR NAUSEA MEDICATION  *UNUSUAL SHORTNESS OF BREATH  *UNUSUAL BRUISING OR BLEEDING  TENDERNESS IN MOUTH AND THROAT WITH OR WITHOUT PRESENCE OF ULCERS  *URINARY PROBLEMS  *BOWEL PROBLEMS  UNUSUAL RASH Items with * indicate a potential emergency and should be followed up as soon as possible.  Feel free to call the clinic you have any questions or concerns. The clinic phone number is [phone].  Please show the CHEMO ALERT CARD at check-in to the Emergency Department and triage nurse.   

## 2015-08-10 NOTE — Progress Notes (Signed)
Pt got Vidaza subq on her RLQ. Pt had difficulty tolerating injection. Pt received 80% of Vidaza today. Complaints of pain at the injection site. Pt does not want to continue with the rest of vidaza today and will try again tomorrow. Pt stable upon discharge. Declined AVS.

## 2015-08-11 ENCOUNTER — Ambulatory Visit: Payer: Medicare Other

## 2015-08-11 ENCOUNTER — Other Ambulatory Visit (HOSPITAL_BASED_OUTPATIENT_CLINIC_OR_DEPARTMENT_OTHER): Payer: Medicare Other

## 2015-08-11 ENCOUNTER — Ambulatory Visit (HOSPITAL_BASED_OUTPATIENT_CLINIC_OR_DEPARTMENT_OTHER): Payer: Medicare Other

## 2015-08-11 VITALS — BP 155/79 | HR 87 | Temp 97.8°F

## 2015-08-11 DIAGNOSIS — C9200 Acute myeloblastic leukemia, not having achieved remission: Secondary | ICD-10-CM

## 2015-08-11 DIAGNOSIS — Z5111 Encounter for antineoplastic chemotherapy: Secondary | ICD-10-CM

## 2015-08-11 DIAGNOSIS — D61818 Other pancytopenia: Secondary | ICD-10-CM

## 2015-08-11 LAB — CBC WITH DIFFERENTIAL/PLATELET
BASO%: 1.7 % (ref 0.0–2.0)
BASOS ABS: 0.1 10*3/uL (ref 0.0–0.1)
EOS ABS: 0.2 10*3/uL (ref 0.0–0.5)
EOS%: 3.2 % (ref 0.0–7.0)
HCT: 36.4 % (ref 34.8–46.6)
HGB: 11.9 g/dL (ref 11.6–15.9)
LYMPH%: 17.4 % (ref 14.0–49.7)
MCH: 32.3 pg (ref 25.1–34.0)
MCHC: 32.7 g/dL (ref 31.5–36.0)
MCV: 98.9 fL (ref 79.5–101.0)
MONO#: 0.3 10*3/uL (ref 0.1–0.9)
MONO%: 4.5 % (ref 0.0–14.0)
NEUT#: 4.4 10*3/uL (ref 1.5–6.5)
NEUT%: 73.2 % (ref 38.4–76.8)
PLATELETS: 429 10*3/uL — AB (ref 145–400)
RBC: 3.68 10*6/uL — AB (ref 3.70–5.45)
RDW: 17.6 % — ABNORMAL HIGH (ref 11.2–14.5)
WBC: 6 10*3/uL (ref 3.9–10.3)
lymph#: 1.1 10*3/uL (ref 0.9–3.3)

## 2015-08-11 LAB — CHCC SMEAR

## 2015-08-11 MED ORDER — ONDANSETRON HCL 8 MG PO TABS
8.0000 mg | ORAL_TABLET | Freq: Once | ORAL | Status: AC
  Administered 2015-08-11: 8 mg via ORAL

## 2015-08-11 MED ORDER — AZACITIDINE CHEMO SQ INJECTION
75.0000 mg/m2 | Freq: Once | INTRAMUSCULAR | Status: AC
  Administered 2015-08-11: 140 mg via SUBCUTANEOUS
  Filled 2015-08-11: qty 5.6

## 2015-08-11 MED ORDER — ONDANSETRON HCL 8 MG PO TABS
ORAL_TABLET | ORAL | Status: AC
  Filled 2015-08-11: qty 1

## 2015-08-11 NOTE — Progress Notes (Signed)
PLTC 425 today.  She doesn't need N-plate injection today.

## 2015-08-11 NOTE — Patient Instructions (Signed)
[facility] Discharge Instructions for Patients Receiving Chemotherapy  Today you received the following chemotherapy agents:  Vidaza  To help prevent nausea and vomiting after your treatment, we encourage you to take your nausea medication as ordered per MD.    If you develop nausea and vomiting that is not controlled by your nausea medication, call the clinic.   BELOW ARE SYMPTOMS THAT SHOULD BE REPORTED IMMEDIATELY:  *FEVER GREATER THAN 100.5 F  *CHILLS WITH OR WITHOUT FEVER  NAUSEA AND VOMITING THAT IS NOT CONTROLLED WITH YOUR NAUSEA MEDICATION  *UNUSUAL SHORTNESS OF BREATH  *UNUSUAL BRUISING OR BLEEDING  TENDERNESS IN MOUTH AND THROAT WITH OR WITHOUT PRESENCE OF ULCERS  *URINARY PROBLEMS  *BOWEL PROBLEMS  UNUSUAL RASH Items with * indicate a potential emergency and should be followed up as soon as possible.  Feel free to call the clinic you have any questions or concerns. The clinic phone number is [phone].  Please show the CHEMO ALERT CARD at check-in to the Emergency Department and triage nurse.   

## 2015-08-14 ENCOUNTER — Ambulatory Visit (HOSPITAL_BASED_OUTPATIENT_CLINIC_OR_DEPARTMENT_OTHER): Payer: Medicare Other

## 2015-08-14 ENCOUNTER — Other Ambulatory Visit: Payer: Self-pay | Admitting: Family

## 2015-08-14 VITALS — BP 176/75 | HR 79 | Temp 97.6°F | Resp 18

## 2015-08-14 DIAGNOSIS — C9200 Acute myeloblastic leukemia, not having achieved remission: Secondary | ICD-10-CM | POA: Diagnosis not present

## 2015-08-14 DIAGNOSIS — Z5111 Encounter for antineoplastic chemotherapy: Secondary | ICD-10-CM | POA: Diagnosis not present

## 2015-08-14 DIAGNOSIS — D61818 Other pancytopenia: Secondary | ICD-10-CM

## 2015-08-14 MED ORDER — ONDANSETRON HCL 8 MG PO TABS
ORAL_TABLET | ORAL | Status: AC
Start: 2015-08-14 — End: 2015-08-14
  Filled 2015-08-14: qty 1

## 2015-08-14 MED ORDER — ONDANSETRON HCL 8 MG PO TABS
8.0000 mg | ORAL_TABLET | Freq: Once | ORAL | Status: AC
  Administered 2015-08-14: 8 mg via ORAL

## 2015-08-14 MED ORDER — AZACITIDINE CHEMO SQ INJECTION
75.0000 mg/m2 | Freq: Once | INTRAMUSCULAR | Status: AC
  Administered 2015-08-14: 140 mg via SUBCUTANEOUS
  Filled 2015-08-14: qty 5.6

## 2015-08-14 NOTE — Patient Instructions (Signed)
[facility] Discharge Instructions for Patients Receiving Chemotherapy  Today you received the following chemotherapy agents:  Vidaza  To help prevent nausea and vomiting after your treatment, we encourage you to take your nausea medication as ordered per MD.    If you develop nausea and vomiting that is not controlled by your nausea medication, call the clinic.   BELOW ARE SYMPTOMS THAT SHOULD BE REPORTED IMMEDIATELY:  *FEVER GREATER THAN 100.5 F  *CHILLS WITH OR WITHOUT FEVER  NAUSEA AND VOMITING THAT IS NOT CONTROLLED WITH YOUR NAUSEA MEDICATION  *UNUSUAL SHORTNESS OF BREATH  *UNUSUAL BRUISING OR BLEEDING  TENDERNESS IN MOUTH AND THROAT WITH OR WITHOUT PRESENCE OF ULCERS  *URINARY PROBLEMS  *BOWEL PROBLEMS  UNUSUAL RASH Items with * indicate a potential emergency and should be followed up as soon as possible.  Feel free to call the clinic you have any questions or concerns. The clinic phone number is [phone].  Please show the CHEMO ALERT CARD at check-in to the Emergency Department and triage nurse.   

## 2015-08-15 ENCOUNTER — Ambulatory Visit (HOSPITAL_BASED_OUTPATIENT_CLINIC_OR_DEPARTMENT_OTHER): Payer: Medicare Other

## 2015-08-15 VITALS — BP 174/61 | HR 72 | Temp 97.9°F | Resp 18

## 2015-08-15 DIAGNOSIS — C92Z0 Other myeloid leukemia not having achieved remission: Secondary | ICD-10-CM

## 2015-08-15 DIAGNOSIS — D61818 Other pancytopenia: Secondary | ICD-10-CM

## 2015-08-15 DIAGNOSIS — Z5111 Encounter for antineoplastic chemotherapy: Secondary | ICD-10-CM | POA: Diagnosis not present

## 2015-08-15 DIAGNOSIS — C9200 Acute myeloblastic leukemia, not having achieved remission: Secondary | ICD-10-CM

## 2015-08-15 MED ORDER — ONDANSETRON HCL 8 MG PO TABS
ORAL_TABLET | ORAL | Status: AC
  Filled 2015-08-15: qty 1

## 2015-08-15 MED ORDER — AZACITIDINE CHEMO SQ INJECTION
75.0000 mg/m2 | Freq: Once | INTRAMUSCULAR | Status: AC
  Administered 2015-08-15: 140 mg via SUBCUTANEOUS
  Filled 2015-08-15: qty 5.6

## 2015-08-15 MED ORDER — ONDANSETRON HCL 8 MG PO TABS
8.0000 mg | ORAL_TABLET | Freq: Once | ORAL | Status: AC
  Administered 2015-08-15: 8 mg via ORAL

## 2015-08-15 NOTE — Patient Instructions (Signed)
[facility] Discharge Instructions for Patients Receiving Chemotherapy  Today you received the following chemotherapy agents:  Vidaza  To help prevent nausea and vomiting after your treatment, we encourage you to take your nausea medication as ordered per MD.    If you develop nausea and vomiting that is not controlled by your nausea medication, call the clinic.   BELOW ARE SYMPTOMS THAT SHOULD BE REPORTED IMMEDIATELY:  *FEVER GREATER THAN 100.5 F  *CHILLS WITH OR WITHOUT FEVER  NAUSEA AND VOMITING THAT IS NOT CONTROLLED WITH YOUR NAUSEA MEDICATION  *UNUSUAL SHORTNESS OF BREATH  *UNUSUAL BRUISING OR BLEEDING  TENDERNESS IN MOUTH AND THROAT WITH OR WITHOUT PRESENCE OF ULCERS  *URINARY PROBLEMS  *BOWEL PROBLEMS  UNUSUAL RASH Items with * indicate a potential emergency and should be followed up as soon as possible.  Feel free to call the clinic you have any questions or concerns. The clinic phone number is [phone].  Please show the CHEMO ALERT CARD at check-in to the Emergency Department and triage nurse.   

## 2015-08-15 NOTE — Telephone Encounter (Signed)
[name],  When you saw this patient back in October you asked her to follow up in a month regarding her depression.  She didn't follow up.  Now she is requesting a re-fill for the venlafaxine.  Please advise about this.

## 2015-08-17 ENCOUNTER — Other Ambulatory Visit: Payer: Self-pay | Admitting: Family

## 2015-08-17 ENCOUNTER — Telehealth: Payer: Self-pay

## 2015-08-17 NOTE — Telephone Encounter (Signed)
Recd faxed rx request for hydrocholorothiazide 25mg  tab from [facility]----i'm not seeing a recent office visit where this was addressed---are you ok with refilling, please advise, thanks

## 2015-08-18 ENCOUNTER — Ambulatory Visit: Payer: Medicare Other

## 2015-08-18 ENCOUNTER — Ambulatory Visit (HOSPITAL_BASED_OUTPATIENT_CLINIC_OR_DEPARTMENT_OTHER): Payer: Medicare Other | Admitting: Oncology

## 2015-08-18 ENCOUNTER — Other Ambulatory Visit (HOSPITAL_BASED_OUTPATIENT_CLINIC_OR_DEPARTMENT_OTHER): Payer: Medicare Other

## 2015-08-18 VITALS — BP 145/85 | HR 80 | Temp 97.5°F | Resp 17 | Ht 68.0 in | Wt 154.0 lb

## 2015-08-18 DIAGNOSIS — C92Z0 Other myeloid leukemia not having achieved remission: Secondary | ICD-10-CM | POA: Diagnosis not present

## 2015-08-18 DIAGNOSIS — D693 Immune thrombocytopenic purpura: Secondary | ICD-10-CM | POA: Diagnosis not present

## 2015-08-18 DIAGNOSIS — D696 Thrombocytopenia, unspecified: Secondary | ICD-10-CM

## 2015-08-18 DIAGNOSIS — C9200 Acute myeloblastic leukemia, not having achieved remission: Secondary | ICD-10-CM

## 2015-08-18 DIAGNOSIS — D61818 Other pancytopenia: Secondary | ICD-10-CM

## 2015-08-18 LAB — COMPREHENSIVE METABOLIC PANEL
ALT: 12 U/L (ref 0–55)
AST: 20 U/L (ref 5–34)
Albumin: 3.5 g/dL (ref 3.5–5.0)
Alkaline Phosphatase: 68 U/L (ref 40–150)
Anion Gap: 7 mEq/L (ref 3–11)
BUN: 15.5 mg/dL (ref 7.0–26.0)
CHLORIDE: 101 meq/L (ref 98–109)
CO2: 30 meq/L — AB (ref 22–29)
CREATININE: 0.9 mg/dL (ref 0.6–1.1)
Calcium: 9.4 mg/dL (ref 8.4–10.4)
EGFR: 64 mL/min/{1.73_m2} — ABNORMAL LOW (ref >90)
GLUCOSE: 86 mg/dL (ref 70–140)
POTASSIUM: 3.7 meq/L (ref 3.5–5.1)
SODIUM: 138 meq/L (ref 136–145)
Total Bilirubin: 0.75 mg/dL (ref 0.20–1.20)
Total Protein: 7.1 g/dL (ref 6.4–8.3)

## 2015-08-18 LAB — CBC WITH DIFFERENTIAL/PLATELET
BASO%: 1 % (ref 0.0–2.0)
BASOS ABS: 0.1 10*3/uL (ref 0.0–0.1)
EOS%: 2.2 % (ref 0.0–7.0)
Eosinophils Absolute: 0.2 10*3/uL (ref 0.0–0.5)
HCT: 37.9 % (ref 34.8–46.6)
HGB: 12.5 g/dL (ref 11.6–15.9)
LYMPH#: 1.9 10*3/uL (ref 0.9–3.3)
LYMPH%: 17.2 % (ref 14.0–49.7)
MCH: 31.9 pg (ref 25.1–34.0)
MCHC: 32.9 g/dL (ref 31.5–36.0)
MCV: 97 fL (ref 79.5–101.0)
MONO#: 0.5 10*3/uL (ref 0.1–0.9)
MONO%: 4.1 % (ref 0.0–14.0)
NEUT#: 8.4 10*3/uL — ABNORMAL HIGH (ref 1.5–6.5)
NEUT%: 75.5 % (ref 38.4–76.8)
Platelets: 303 10*3/uL (ref 145–400)
RBC: 3.91 10*6/uL (ref 3.70–5.45)
RDW: 19 % — AB (ref 11.2–14.5)
WBC: 11.1 10*3/uL — ABNORMAL HIGH (ref 3.9–10.3)

## 2015-08-18 MED ORDER — HYDROCHLOROTHIAZIDE 25 MG PO TABS: 25.0000 mg | ORAL_TABLET | Freq: Every day | ORAL | Status: DC

## 2015-08-18 NOTE — Addendum Note (Signed)
Addended by: [name] D on: 08/18/2015 09:29 PM   Modules accepted: Orders

## 2015-08-18 NOTE — Progress Notes (Signed)
[name]  [phone]) [phone] Fax:(336) [phone]     ID: [name] DOB: 31-Oct-1935  MR#: [id number]  YQI#:[id number]  Patient Care Team: [place], FNP as PCP - General (Family Medicine) PCP: [name], FNP, [name] GYN: SU:  OTHER MD: [name], [name], [name]  CHIEF COMPLAINT: Acute myeloid leukemia; ITP  CURRENT TREATMENT: Azacitidine, romiplostim  HISTORY OF PRESENT ILLNESS: From the 05/22/2025 consult note:  "The patient was evaluated at her PCP's office 05/22/2015 with a complaint of DOE worsening over several months. Labwork was obtained including a CBC, whioch showed WBC 1.8, platelets 14K, and Hb 5.9 with an MCV of 97.5. DDimer was 1.04. Accordingly the patient was referred to the ED and was admitted 05/23/2015.   Labwork since admission includes a repeat CBC with WBC 1.7, HB 5.4, MCV 100.6 and platelets 10K. Creat was 1.04 with GFR 50. Reticulocytes are not elevated with abs 39.3 (1.7%) and LDH is normal at 188. Other labs are pending.  The patient tells me because of peripheral neuropathy she has been receiving monthly B-12 shots since 2005."  Bone marrow biopsy 05/25/2015 showed acute myeloid leukemia, with 17% blasts by flow cytometry, 24% by aspirate counts, and 20-30% by immunohistochemistry ([id number], and [id number]). The blasts were positive for CD 117 and focally for MPO. There were also myelodysplasia related changes in all 3 cell lines.  The patient's subsequent history is as detailed below.  INTERVAL HISTORY: [name] returns today for follow-up of her myelodysplasia/acute myeloid leukemia.  Today is day 12 cycle 3 of azacitidine. She is also receiving romiplostim weekly depending on her platelet count.  Since we treated her platelet count for ITP, her counts have completely normalized and she has not required any further transfusions.   REVIEW OF SYSTEMS: [name] tells me she feels "like  herself". She denies any fever or bleeding problems. She continues to be the primary caregiver for her husband weight, who has a heart assist pump in place and will status post stroke. They hired a lady to help them through her friends recommendation but she has turned out to be unpleasant and wanting more money. Aside from those issues [name] tells me her appetite is poor, she has some heartburn, she feels a little forgetful, and she has stress urinary incontinence. A detailed review of systems today was otherwise noncontributory  PAST MEDICAL HISTORY: Past Medical History  Diagnosis Date  . GERD (gastroesophageal reflux disease)   . Depression   . Hypertension   . Shortness of breath dyspnea     with exertion  . History of blood transfusion 05/23/2015    "Hgb 5.9"  . Anemia   . Arthritis     "hand joints; hips; back" (05/23/2015)  . Chronic lower back pain   . Anxiety     PAST SURGICAL HISTORY: Past Surgical History  Procedure Laterality Date  . Bone spur      R thigh  . Total shoulder arthroplasty  09/13/2011    Procedure: TOTAL SHOULDER ARTHROPLASTY;  Surgeon: Augustin Schooling, MD;  Location: [facility];  Service: Orthopedics;  Laterality: Left;  LEFT SHOULDER REVERSED TOTAL SHOULDER ARTHROPLASTY  . Total knee arthroplasty  04/27/2012    Procedure: TOTAL KNEE ARTHROPLASTY;  Surgeon: Mauri Pole, MD;  Location: [facility];  Service: Orthopedics;  Laterality: Right;  . Cataract extraction w/ intraocular lens  implant, bilateral  ~ 2005  . Tonsillectomy  ~ 1950  . Esophagogastroduodenoscopy (egd) with propofol  N/A 11/08/2014    Procedure: ESOPHAGOGASTRODUODENOSCOPY (EGD) WITH PROPOFOL;  Surgeon: Garlan Fair, MD;  Location: [facility];  Service: Endoscopy;  Laterality: N/A;  . Joint replacement    . Dilation and curettage of uterus      FAMILY HISTORY Family History  Problem Relation Age of Onset  . Heart attack Mother 87    Died age 40  . Heart disease Brother     Atrial fib    Patient's father died age 65 with prostate cancer; mother died atv60 with an MI. One brother, alive at 53; one sister with parkinson's. No blood or cancer problems otherwise in family  GYNECOLOGIC HISTORY:  No LMP recorded. Patient is postmenopausal. 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 He has severe heart disease and she is his main caretaker. Son [name] works for [employer] and sings in the [employer]; daughter [name] lives in [place], a homemaker; daughter [name] works at the surgical center in [employer] as an Therapist, sports. The patient has 7 gch. She is a Psychologist, forensic    ADVANCED DIRECTIVES: in place; the patient's son [name] is her [place]: Social History  Substance Use Topics  . Smoking status: Never Smoker   . Smokeless tobacco: Never Used  . Alcohol Use: No     Colonoscopy:  PAP:  Bone density:  Lipid panel:  Allergies  Allergen Reactions  . Cozaar [Losartan] Other (See Comments)    Cause pt to lose her taste for foods  . Codeine Itching and Other (See Comments)    Makes feel crazy  PT STATES SHE ALSO CAN NOT TAKE THE SYNTHETIC CODEINES  . Hydrocodone Itching    Tolerates with benadryl  . Oxycodone Itching  . Tetanus Toxoids Swelling and Rash    Current Outpatient Prescriptions  Medication Sig Dispense Refill  . amLODipine (NORVASC) 2.5 MG tablet Take 1 tablet (2.5 mg total) by mouth daily. 90 tablet 3  . Cyanocobalamin 1000 MCG/ML KIT Inject 1,000 mcg as directed every 30 (thirty) days. B 12 shot    . hydrochlorothiazide (HYDRODIURIL) 25 MG tablet     . Melatonin 5 MG TABS Take 2.5 mg by mouth at bedtime.    . Multiple Vitamin (MULTIVITAMIN WITH MINERALS) TABS tablet Take 1 tablet by mouth daily.    . ondansetron (ZOFRAN) 8 MG tablet Take 1 tablet (8 mg total) by mouth 2 (two) times daily as needed for nausea or vomiting. 30 tablet 1  .  pantoprazole (PROTONIX) 40 MG tablet Take 40 mg by mouth every morning.    . prochlorperazine (COMPAZINE) 10 MG tablet Take 1 tablet (10 mg total) by mouth every 6 (six) hours as needed for nausea or vomiting. (Patient not taking: Reported on 07/17/2015) 30 tablet 1  . valACYclovir (VALTREX) 1000 MG tablet Take 1,000 mg by mouth 2 (two) times daily.     . Venlafaxine HCl 150 MG TB24 TAKE ONE TABLET BY MOUTH ONCE DAILY 30 tablet 0  . Venlafaxine HCl 150 MG TB24 TAKE ONE TABLET BY MOUTH ONCE DAILY 30 tablet 0  . Vitamin D, Ergocalciferol, (DRISDOL) 50000 units CAPS capsule Take 1 capsule (50,000 Units total) by mouth See admin instructions. Every other week. 30 capsule 1   No current facility-administered medications for this visit.    OBJECTIVE: Older white woman who appears well Filed Vitals:   08/18/15  1507  BP: 145/85  Pulse: 80  Temp: 97.5 F (36.4 C)  Resp: 17     Body mass index is 23.42 kg/(m^2).    ECOG FS:1 - Symptomatic but completely ambulatory  Sclerae unicteric, EOMs intact Oropharynx clear, slightly dry r No cervical or supraclavicular adenopathy Lungs no rales or rhonchi Heart regular rate and rhythm Abd soft, nontender, positive bowel sounds MSK no focal spinal tenderness, no joint edema or erythema Neuro: nonfocal, well oriented, appropriate affect Breasts: Deferred Skin: Nonpalpable erythema in the left lower quadrant at the site of the most recent is azacitiiidine shot  LAB RESULTS:  CMP     Component Value Date/Time   NA 137 08/07/2015 1002   NA 138 05/25/2015 0332   K 3.6 08/07/2015 1002   K 4.1 05/25/2015 0332   CL 104 05/25/2015 0332   CO2 26 08/07/2015 1002   CO2 28 05/25/2015 0332   GLUCOSE 103 08/07/2015 1002   GLUCOSE 96 05/25/2015 0332   BUN 15.6 08/07/2015 1002   BUN 17 05/25/2015 0332   CREATININE 1.0 08/07/2015 1002   CREATININE 0.93 05/25/2015 0332   CALCIUM 9.1 08/07/2015 1002   CALCIUM 8.8* 05/25/2015 0332   PROT 7.1 08/07/2015 1002     PROT 6.3* 05/24/2015 0415   ALBUMIN 3.5 08/07/2015 1002   ALBUMIN 3.4* 05/24/2015 0415   AST 21 08/07/2015 1002   AST 15 05/24/2015 0415   ALT 13 08/07/2015 1002   ALT 11* 05/24/2015 0415   ALKPHOS 64 08/07/2015 1002   ALKPHOS 56 05/24/2015 0415   BILITOT 0.62 08/07/2015 1002   BILITOT 1.3* 05/24/2015 0415   GFRNONAA 58* 05/25/2015 0332   GFRAA >60 05/25/2015 0332    INo results found for: SPEP, UPEP  Lab Results  Component Value Date   WBC 11.1* 08/18/2015   NEUTROABS 8.4* 08/18/2015   HGB 12.5 08/18/2015   HCT 37.9 08/18/2015   MCV 97.0 08/18/2015   PLT 303 08/18/2015      Chemistry      Component Value Date/Time   NA 137 08/07/2015 1002   NA 138 05/25/2015 0332   K 3.6 08/07/2015 1002   K 4.1 05/25/2015 0332   CL 104 05/25/2015 0332   CO2 26 08/07/2015 1002   CO2 28 05/25/2015 0332   BUN 15.6 08/07/2015 1002   BUN 17 05/25/2015 0332   CREATININE 1.0 08/07/2015 1002   CREATININE 0.93 05/25/2015 0332      Component Value Date/Time   CALCIUM 9.1 08/07/2015 1002   CALCIUM 8.8* 05/25/2015 0332   ALKPHOS 64 08/07/2015 1002   ALKPHOS 56 05/24/2015 0415   AST 21 08/07/2015 1002   AST 15 05/24/2015 0415   ALT 13 08/07/2015 1002   ALT 11* 05/24/2015 0415   BILITOT 0.62 08/07/2015 1002   BILITOT 1.3* 05/24/2015 0415       No results found for: LABCA2  No components found for: LABCA125  No results for input(s): INR in the last 168 hours.  Urinalysis    Component Value Date/Time   COLORURINE YELLOW 04/21/2012 0946   APPEARANCEUR CLOUDY* 04/21/2012 0946   LABSPEC 1.019 04/21/2012 0946   PHURINE 7.0 04/21/2012 0946   GLUCOSEU NEGATIVE 04/21/2012 0946   HGBUR NEGATIVE 04/21/2012 0946   BILIRUBINUR NEGATIVE 04/21/2012 0946   KETONESUR NEGATIVE 04/21/2012 0946   PROTEINUR NEGATIVE 04/21/2012 0946   UROBILINOGEN 1.0 04/21/2012 0946   NITRITE NEGATIVE 04/21/2012 0946   LEUKOCYTESUR NEGATIVE 04/21/2012 0946    STUDIES: No results found.  ASSESSMENT:  80 y.o. [place] woman with acute myeloid leukemia diagnosed by bone marrow biopsy 05/25/2015, with the blast count between 17 and 30% depending on the method of determination.   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, with gain of 2 and 19  (b) consider allogenic transplant  (c) all transfusion products to be irradiated  (1) started sQ azacitidine 06/05/2015, receiving 7 doses every 28 day cycle  (2) immune thrombocytopenic purpura  (a) s/p IVIG 07/06/2015 (1g/kg x 2d) with excellent response  (b) romiplostim first dose 07/07/2015, started weekly as of 08/04/2015   PLAN:  Ameliyah has completed 3 cycles of azacitabine. She is tolerating that well. What really turned the corner though was a brief treatment with IVIG and then continuing romiplostim. That normalized her platelets, she stopped bleeding, and she has not required any transfusions since the first week in January.  At this point the plan is for 3 more cycles of azacitidine after which we will repeat a bone marrow biopsy. Depending on that we will consider whether switching to lenalidomide or a similar agent, which might be easier on her, would be advisable.  Her next cycle will start 09/04/2015. She knows to call for any problems that may develop before that visit.  Marland Kitchen  [name], MD   08/18/2015 3:15 PM

## 2015-08-18 NOTE — Telephone Encounter (Signed)
Medication refilled. Needs office visit for additional refills.

## 2015-08-22 ENCOUNTER — Other Ambulatory Visit: Payer: Self-pay | Admitting: Nurse Practitioner

## 2015-08-22 ENCOUNTER — Other Ambulatory Visit: Payer: Self-pay

## 2015-08-22 ENCOUNTER — Telehealth: Payer: Self-pay | Admitting: Oncology

## 2015-08-22 MED ORDER — CYANOCOBALAMIN 1000 MCG/ML IJ KIT: 1000.0000 ug | PACK | INTRAMUSCULAR | Status: DC

## 2015-08-22 NOTE — Telephone Encounter (Signed)
Left message for patient in regards to 3/3 appt and that she could receive an updated copy of added appts per 2/24 pof

## 2015-08-25 ENCOUNTER — Ambulatory Visit (HOSPITAL_BASED_OUTPATIENT_CLINIC_OR_DEPARTMENT_OTHER): Payer: Medicare Other

## 2015-08-25 ENCOUNTER — Other Ambulatory Visit (HOSPITAL_BASED_OUTPATIENT_CLINIC_OR_DEPARTMENT_OTHER): Payer: Medicare Other

## 2015-08-25 VITALS — BP 149/70 | HR 79 | Temp 98.2°F

## 2015-08-25 DIAGNOSIS — C9200 Acute myeloblastic leukemia, not having achieved remission: Secondary | ICD-10-CM

## 2015-08-25 DIAGNOSIS — D696 Thrombocytopenia, unspecified: Secondary | ICD-10-CM

## 2015-08-25 DIAGNOSIS — D61818 Other pancytopenia: Secondary | ICD-10-CM

## 2015-08-25 DIAGNOSIS — D693 Immune thrombocytopenic purpura: Secondary | ICD-10-CM

## 2015-08-25 DIAGNOSIS — C92Z0 Other myeloid leukemia not having achieved remission: Secondary | ICD-10-CM | POA: Diagnosis not present

## 2015-08-25 LAB — CBC WITH DIFFERENTIAL/PLATELET
BASO%: 0.6 % (ref 0.0–2.0)
BASOS ABS: 0 10*3/uL (ref 0.0–0.1)
EOS%: 4.5 % (ref 0.0–7.0)
Eosinophils Absolute: 0.3 10*3/uL (ref 0.0–0.5)
HEMATOCRIT: 37.6 % (ref 34.8–46.6)
HEMOGLOBIN: 12.3 g/dL (ref 11.6–15.9)
LYMPH#: 1.5 10*3/uL (ref 0.9–3.3)
LYMPH%: 25.5 % (ref 14.0–49.7)
MCH: 32 pg (ref 25.1–34.0)
MCHC: 32.7 g/dL (ref 31.5–36.0)
MCV: 97.7 fL (ref 79.5–101.0)
MONO#: 0.4 10*3/uL (ref 0.1–0.9)
MONO%: 7.6 % (ref 0.0–14.0)
NEUT%: 61.8 % (ref 38.4–76.8)
NEUTROS ABS: 3.7 10*3/uL (ref 1.5–6.5)
Platelets: 157 10*3/uL (ref 145–400)
RBC: 3.85 10*6/uL (ref 3.70–5.45)
RDW: 18.8 % — AB (ref 11.2–14.5)
WBC: 5.9 10*3/uL (ref 3.9–10.3)

## 2015-08-25 LAB — CHCC SMEAR

## 2015-08-25 MED ORDER — ROMIPLOSTIM 250 MCG ~~LOC~~ SOLR
60.0000 ug | Freq: Once | SUBCUTANEOUS | Status: AC
  Administered 2015-08-25: 60 ug via SUBCUTANEOUS
  Filled 2015-08-25: qty 0.12

## 2015-09-01 ENCOUNTER — Other Ambulatory Visit (HOSPITAL_BASED_OUTPATIENT_CLINIC_OR_DEPARTMENT_OTHER): Payer: Medicare Other

## 2015-09-01 ENCOUNTER — Other Ambulatory Visit: Payer: Self-pay | Admitting: *Deleted

## 2015-09-01 ENCOUNTER — Telehealth: Payer: Self-pay | Admitting: Oncology

## 2015-09-01 ENCOUNTER — Ambulatory Visit (HOSPITAL_BASED_OUTPATIENT_CLINIC_OR_DEPARTMENT_OTHER): Payer: Medicare Other

## 2015-09-01 VITALS — BP 164/72 | HR 70 | Temp 97.8°F | Resp 20

## 2015-09-01 DIAGNOSIS — D693 Immune thrombocytopenic purpura: Secondary | ICD-10-CM | POA: Diagnosis not present

## 2015-09-01 DIAGNOSIS — C9200 Acute myeloblastic leukemia, not having achieved remission: Secondary | ICD-10-CM

## 2015-09-01 DIAGNOSIS — D696 Thrombocytopenia, unspecified: Secondary | ICD-10-CM

## 2015-09-01 DIAGNOSIS — C92Z0 Other myeloid leukemia not having achieved remission: Secondary | ICD-10-CM | POA: Diagnosis not present

## 2015-09-01 DIAGNOSIS — D61818 Other pancytopenia: Secondary | ICD-10-CM

## 2015-09-01 LAB — CBC WITH DIFFERENTIAL/PLATELET
BASO%: 1 % (ref 0.0–2.0)
BASOS ABS: 0 10*3/uL (ref 0.0–0.1)
EOS ABS: 0.1 10*3/uL (ref 0.0–0.5)
EOS%: 2 % (ref 0.0–7.0)
HEMATOCRIT: 37.6 % (ref 34.8–46.6)
HEMOGLOBIN: 12.4 g/dL (ref 11.6–15.9)
LYMPH%: 24 % (ref 14.0–49.7)
MCH: 32.6 pg (ref 25.1–34.0)
MCHC: 33 g/dL (ref 31.5–36.0)
MCV: 98.8 fL (ref 79.5–101.0)
MONO#: 0.2 10*3/uL (ref 0.1–0.9)
MONO%: 4.9 % (ref 0.0–14.0)
NEUT%: 68.1 % (ref 38.4–76.8)
NEUTROS ABS: 3.1 10*3/uL (ref 1.5–6.5)
PLATELETS: 203 10*3/uL (ref 145–400)
RBC: 3.8 10*6/uL (ref 3.70–5.45)
RDW: 18 % — ABNORMAL HIGH (ref 11.2–14.5)
WBC: 4.5 10*3/uL (ref 3.9–10.3)
lymph#: 1.1 10*3/uL (ref 0.9–3.3)

## 2015-09-01 LAB — COMPREHENSIVE METABOLIC PANEL
ALBUMIN: 3.5 g/dL (ref 3.5–5.0)
ALK PHOS: 68 U/L (ref 40–150)
ALT: 11 U/L (ref 0–55)
ANION GAP: 9 meq/L (ref 3–11)
AST: 18 U/L (ref 5–34)
BILIRUBIN TOTAL: 0.67 mg/dL (ref 0.20–1.20)
BUN: 12.2 mg/dL (ref 7.0–26.0)
CALCIUM: 9.3 mg/dL (ref 8.4–10.4)
CO2: 29 mEq/L (ref 22–29)
Chloride: 103 mEq/L (ref 98–109)
Creatinine: 0.9 mg/dL (ref 0.6–1.1)
EGFR: 59 mL/min/{1.73_m2} — AB (ref >90)
GLUCOSE: 95 mg/dL (ref 70–140)
POTASSIUM: 3.6 meq/L (ref 3.5–5.1)
Sodium: 141 mEq/L (ref 136–145)
TOTAL PROTEIN: 6.8 g/dL (ref 6.4–8.3)

## 2015-09-01 LAB — TECHNOLOGIST REVIEW

## 2015-09-01 LAB — CHCC SMEAR

## 2015-09-01 MED ORDER — ROMIPLOSTIM 250 MCG ~~LOC~~ SOLR
60.0000 ug | Freq: Once | SUBCUTANEOUS | Status: AC
  Administered 2015-09-01: 60 ug via SUBCUTANEOUS
  Filled 2015-09-01: qty 0.12

## 2015-09-01 NOTE — Telephone Encounter (Signed)
Pt has another appt at [facility] per HB cx 3.16 appt

## 2015-09-04 ENCOUNTER — Ambulatory Visit (HOSPITAL_BASED_OUTPATIENT_CLINIC_OR_DEPARTMENT_OTHER): Payer: Medicare Other | Admitting: Nurse Practitioner

## 2015-09-04 ENCOUNTER — Telehealth: Payer: Self-pay | Admitting: Nurse Practitioner

## 2015-09-04 ENCOUNTER — Other Ambulatory Visit: Payer: Self-pay | Admitting: *Deleted

## 2015-09-04 ENCOUNTER — Other Ambulatory Visit: Payer: Self-pay | Admitting: Hematology and Oncology

## 2015-09-04 ENCOUNTER — Ambulatory Visit (HOSPITAL_BASED_OUTPATIENT_CLINIC_OR_DEPARTMENT_OTHER): Payer: Medicare Other

## 2015-09-04 ENCOUNTER — Encounter: Payer: Self-pay | Admitting: Nurse Practitioner

## 2015-09-04 VITALS — BP 179/78 | HR 71 | Temp 97.5°F | Resp 17 | Ht 68.0 in | Wt 152.7 lb

## 2015-09-04 VITALS — BP 179/78

## 2015-09-04 DIAGNOSIS — I10 Essential (primary) hypertension: Secondary | ICD-10-CM

## 2015-09-04 DIAGNOSIS — M25552 Pain in left hip: Secondary | ICD-10-CM

## 2015-09-04 DIAGNOSIS — Z5111 Encounter for antineoplastic chemotherapy: Secondary | ICD-10-CM | POA: Diagnosis not present

## 2015-09-04 DIAGNOSIS — D693 Immune thrombocytopenic purpura: Secondary | ICD-10-CM

## 2015-09-04 DIAGNOSIS — C92Z0 Other myeloid leukemia not having achieved remission: Secondary | ICD-10-CM | POA: Diagnosis not present

## 2015-09-04 DIAGNOSIS — C9200 Acute myeloblastic leukemia, not having achieved remission: Secondary | ICD-10-CM

## 2015-09-04 DIAGNOSIS — R634 Abnormal weight loss: Secondary | ICD-10-CM

## 2015-09-04 DIAGNOSIS — R12 Heartburn: Secondary | ICD-10-CM

## 2015-09-04 DIAGNOSIS — D61818 Other pancytopenia: Secondary | ICD-10-CM

## 2015-09-04 MED ORDER — AMLODIPINE BESYLATE 5 MG PO TABS
2.5000 mg | ORAL_TABLET | Freq: Once | ORAL | Status: AC
  Administered 2015-09-04: 2.5 mg via ORAL
  Filled 2015-09-04: qty 0.5

## 2015-09-04 MED ORDER — AZACITIDINE CHEMO SQ INJECTION
75.0000 mg/m2 | Freq: Once | INTRAMUSCULAR | Status: AC
  Administered 2015-09-04: 140 mg via SUBCUTANEOUS
  Filled 2015-09-04: qty 5.6

## 2015-09-04 MED ORDER — HYDROCHLOROTHIAZIDE 25 MG PO TABS
25.0000 mg | ORAL_TABLET | Freq: Once | ORAL | Status: AC
  Administered 2015-09-04: 25 mg via ORAL
  Filled 2015-09-04: qty 1

## 2015-09-04 MED ORDER — ONDANSETRON HCL 8 MG PO TABS
8.0000 mg | ORAL_TABLET | Freq: Once | ORAL | Status: AC
  Administered 2015-09-04: 8 mg via ORAL

## 2015-09-04 MED ORDER — ONDANSETRON HCL 8 MG PO TABS
ORAL_TABLET | ORAL | Status: AC
  Filled 2015-09-04: qty 1

## 2015-09-04 NOTE — Progress Notes (Signed)
[facility]  Telephone:(336) [phone] Fax:(336) [phone]   ID: [name] DOB: 06/22/1936  MR#: [id number]  ENI#:[id number]  Patient Care Team: Alisha Circle, FNP as PCP - General (Family Medicine) PCP: Alisha Po, FNP, [name] GYN: SU:  OTHER MD: [name], [name], [name]  CHIEF COMPLAINT: Acute myeloid leukemia; ITP  CURRENT TREATMENT: Azacitidine, romiplostim  HISTORY OF PRESENT ILLNESS: From the 05/22/2025 consult note:  "The patient was evaluated at her PCP's office 05/22/2015 with a complaint of DOE worsening over several months. Labwork was obtained including a CBC, whioch showed WBC 1.8, platelets 14K, and Hb 5.9 with an MCV of 97.5. DDimer was 1.04. Accordingly the patient was referred to the ED and was admitted 05/23/2015.   Labwork since admission includes a repeat CBC with WBC 1.7, HB 5.4, MCV 100.6 and platelets 10K. Creat was 1.04 with GFR 50. Reticulocytes are not elevated with abs 39.3 (1.7%) and LDH is normal at 188. Other labs are pending.  The patient tells me because of peripheral neuropathy she has been receiving monthly B-12 shots since 2005."  Bone marrow biopsy 05/25/2015 showed acute myeloid leukemia, with 17% blasts by flow cytometry, 24% by aspirate counts, and 20-30% by immunohistochemistry ([id number], and [id number]). The blasts were positive for CD 117 and focally for MPO. There were also myelodysplasia related changes in all 3 cell lines.  The patient's subsequent history is as detailed below.  INTERVAL HISTORY: [name] returns today for follow-up of her myelodysplasia/acute myeloid leukemia, accompanied by her husband.  Today is day 1 cycle 4 of azacitidine. She is also receiving romiplostim weekly depending on her platelet count. She denies abnormal bleeding or bruising, and has maintained normal counts for several weeks now.   REVIEW OF SYSTEMS: [name] is a bit thrown off due to [name]  Time. She woke up late and forgot to take her blood pressure medicines, and as a result her SBP is high. She has been having increased left hip pain, and has been applying salonpas patches to this area. She took '800mg'$  ibuprofen yesterday. She denies fevers, chills, nausea, or vomiting. She takes senna daily for constipation, and moved her bowels every other day. Her appetite is decreased secondary to long standing taste changes. She is losing weight. She has some heartburn. She maintains good energy during the day and sleeps well with melatonin QHS. A detailed review of systems is otherwise stable.  PAST MEDICAL HISTORY: Past Medical History  Diagnosis Date  . GERD (gastroesophageal reflux disease)   . Depression   . Hypertension   . Shortness of breath dyspnea     with exertion  . History of blood transfusion 05/23/2015    "Hgb 5.9"  . Anemia   . Arthritis     "hand joints; hips; back" (05/23/2015)  . Chronic lower back pain   . Anxiety     PAST SURGICAL HISTORY: Past Surgical History  Procedure Laterality Date  . Bone spur      R thigh  . Total shoulder arthroplasty  09/13/2011    Procedure: TOTAL SHOULDER ARTHROPLASTY;  Surgeon: Augustin Schooling, MD;  Location: [facility];  Service: Orthopedics;  Laterality: Left;  LEFT SHOULDER REVERSED TOTAL SHOULDER ARTHROPLASTY  . Total knee arthroplasty  04/27/2012    Procedure: TOTAL KNEE ARTHROPLASTY;  Surgeon: Mauri Pole, MD;  Location: [facility];  Service: Orthopedics;  Laterality: Right;  . Cataract extraction w/ intraocular lens  implant, bilateral  ~ 2005  .  Tonsillectomy  ~ 1950  . Esophagogastroduodenoscopy (egd) with propofol N/A 11/08/2014    Procedure: ESOPHAGOGASTRODUODENOSCOPY (EGD) WITH PROPOFOL;  Surgeon: Garlan Fair, MD;  Location: [facility];  Service: Endoscopy;  Laterality: N/A;  . Joint replacement    . Dilation and curettage of uterus      FAMILY HISTORY Family History  Problem Relation Age of Onset  . Heart attack  Mother 39    Died age 57  . Heart disease Brother     Atrial fib  Patient's father died age 57 with prostate cancer; mother died atv25 with an MI. One brother, alive at 9; one sister with parkinson's. No blood or cancer problems otherwise in family  GYNECOLOGIC HISTORY:  No LMP recorded. Patient is postmenopausal. 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 Son [name] works for [employer] and sings in the [employer]; daughter [name] lives in [place], a homemaker; daughter [name] works at the surgical center in [employer] as an Therapist, sports. The patient has 7 gch. She is a Psychologist, forensic    ADVANCED DIRECTIVES: in place; the patient's son [name] is her [place]: Social History  Substance Use Topics  . Smoking status: Never Smoker   . Smokeless tobacco: Never Used  . Alcohol Use: No     Colonoscopy:  PAP:  Bone density:  Lipid panel:  Allergies  Allergen Reactions  . Cozaar [Losartan] Other (See Comments)    Cause pt to lose her taste for foods  . Codeine Itching and Other (See Comments)    Makes feel crazy  PT STATES SHE ALSO CAN NOT TAKE THE SYNTHETIC CODEINES  . Hydrocodone Itching    Tolerates with benadryl  . Oxycodone Itching  . Tetanus Toxoids Swelling and Rash    Current Outpatient Prescriptions  Medication Sig Dispense Refill  . Cyanocobalamin 1000 MCG/ML KIT Inject 1,000 mcg as directed every 30 (thirty) days. B 12 shot 10 kit 1  . Melatonin 5 MG TABS Take 2.5 mg by mouth at bedtime.    . Multiple Vitamin (MULTIVITAMIN WITH MINERALS) TABS tablet Take 1 tablet by mouth daily.    . pantoprazole (PROTONIX) 40 MG tablet Take 40 mg by mouth every morning.    . valACYclovir (VALTREX) 1000 MG tablet Take 1,000 mg by mouth 2 (two) times daily.     . Venlafaxine HCl 150 MG TB24 TAKE ONE TABLET BY MOUTH  ONCE DAILY 30 tablet 0  . Vitamin D, Ergocalciferol, (DRISDOL) 50000 units CAPS capsule Take 1 capsule (50,000 Units total) by mouth See admin instructions. Every other week. 30 capsule 1  . amLODipine (NORVASC) 2.5 MG tablet Take 1 tablet (2.5 mg total) by mouth daily. (Patient not taking: Reported on 09/04/2015) 90 tablet 3  . hydrochlorothiazide (HYDRODIURIL) 25 MG tablet Take 1 tablet (25 mg total) by mouth daily. (Patient not taking: Reported on 09/04/2015) 90 tablet 0  . ondansetron (ZOFRAN) 8 MG tablet Take 1 tablet (8 mg total) by mouth 2 (two) times daily as needed for nausea or vomiting. (Patient not taking: Reported on 09/04/2015) 30 tablet 1   No current facility-administered medications for this visit.    OBJECTIVE: Older white woman who appears well Filed Vitals:   09/04/15 0923  BP: 179/78  Pulse: 71  Temp: 97.5 F (36.4 C)  Resp: 17     Body mass index is 23.22 kg/(m^2).    ECOG FS:1 - Symptomatic but completely ambulatory  Skin: warm, dry  HEENT: sclerae anicteric, conjunctivae pink, oropharynx clear. No thrush or mucositis.  Lymph Nodes: No cervical or supraclavicular lymphadenopathy  Lungs: clear to auscultation bilaterally, no rales, wheezes, or rhonci  Heart: regular rate and rhythm  Abdomen: round, soft, non tender, positive bowel sounds  Musculoskeletal: No focal spinal tenderness, no peripheral edema  Neuro: non focal, well oriented, positive affect   LAB RESULTS:  CMP     Component Value Date/Time   NA 141 09/01/2015 1005   NA 138 05/25/2015 0332   K 3.6 09/01/2015 1005   K 4.1 05/25/2015 0332   CL 104 05/25/2015 0332   CO2 29 09/01/2015 1005   CO2 28 05/25/2015 0332   GLUCOSE 95 09/01/2015 1005   GLUCOSE 96 05/25/2015 0332   BUN 12.2 09/01/2015 1005   BUN 17 05/25/2015 0332   CREATININE 0.9 09/01/2015 1005   CREATININE 0.93 05/25/2015 0332   CALCIUM 9.3 09/01/2015 1005   CALCIUM 8.8* 05/25/2015 0332   PROT 6.8 09/01/2015 1005   PROT 6.3*  05/24/2015 0415   ALBUMIN 3.5 09/01/2015 1005   ALBUMIN 3.4* 05/24/2015 0415   AST 18 09/01/2015 1005   AST 15 05/24/2015 0415   ALT 11 09/01/2015 1005   ALT 11* 05/24/2015 0415   ALKPHOS 68 09/01/2015 1005   ALKPHOS 56 05/24/2015 0415   BILITOT 0.67 09/01/2015 1005   BILITOT 1.3* 05/24/2015 0415   GFRNONAA 58* 05/25/2015 0332   GFRAA >60 05/25/2015 0332    INo results found for: SPEP, UPEP  Lab Results  Component Value Date   WBC 4.5 09/01/2015   NEUTROABS 3.1 09/01/2015   HGB 12.4 09/01/2015   HCT 37.6 09/01/2015   MCV 98.8 09/01/2015   PLT 203 09/01/2015      Chemistry      Component Value Date/Time   NA 141 09/01/2015 1005   NA 138 05/25/2015 0332   K 3.6 09/01/2015 1005   K 4.1 05/25/2015 0332   CL 104 05/25/2015 0332   CO2 29 09/01/2015 1005   CO2 28 05/25/2015 0332   BUN 12.2 09/01/2015 1005   BUN 17 05/25/2015 0332   CREATININE 0.9 09/01/2015 1005   CREATININE 0.93 05/25/2015 0332      Component Value Date/Time   CALCIUM 9.3 09/01/2015 1005   CALCIUM 8.8* 05/25/2015 0332   ALKPHOS 68 09/01/2015 1005   ALKPHOS 56 05/24/2015 0415   AST 18 09/01/2015 1005   AST 15 05/24/2015 0415   ALT 11 09/01/2015 1005   ALT 11* 05/24/2015 0415   BILITOT 0.67 09/01/2015 1005   BILITOT 1.3* 05/24/2015 0415       No results found for: LABCA2  No components found for: LABCA125  No results for input(s): INR in the last 168 hours.  Urinalysis    Component Value Date/Time   COLORURINE YELLOW 04/21/2012 0946   APPEARANCEUR CLOUDY* 04/21/2012 0946   LABSPEC 1.019 04/21/2012 0946   PHURINE 7.0 04/21/2012 0946   GLUCOSEU NEGATIVE 04/21/2012 0946   HGBUR NEGATIVE 04/21/2012 0946   BILIRUBINUR NEGATIVE 04/21/2012 0946   KETONESUR NEGATIVE 04/21/2012 0946   PROTEINUR NEGATIVE 04/21/2012 0946   UROBILINOGEN 1.0 04/21/2012 0946   NITRITE NEGATIVE 04/21/2012 0946   LEUKOCYTESUR NEGATIVE 04/21/2012 0946    STUDIES: No results found.  ASSESSMENT: 80 y.o.  [place] woman with acute myeloid leukemia diagnosed by bone  marrow biopsy 05/25/2015, with the blast count between 17 and 30% depending on the method of determination.   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, with gain of 2 and 19  (b) consider allogenic transplant  (c) all transfusion products to be irradiated  (1) started sQ azacitidine 06/05/2015, receiving 7 doses every 28 day cycle  (2) immune thrombocytopenic purpura  (a) s/p IVIG 07/06/2015 (1g/kg x 2d) with excellent response  (b) romiplostim first dose 07/07/2015, started weekly as of 08/04/2015   PLAN:  [name] is doing well today. The labs were reviewed in detail and were entirely stable. She will proceed with cycle 4 of azacitidine as planned today. She will continue with romipolostim injections every Friday. I have asked her to use tylenol PRN instead of NSAIDs for her pain due to her history of bleeding and thrombocytopenia.   She visits with Dr. Nadara Mustard at [facility] to discuss a transplant, so she will miss the injection due on Thursday. Dr. Jana Hakim is aware, and is ok with this.    Her blood pressure is high today, because she forgot to take her medicines this morning. She will be administered '25mg'$  HCTZ and 2.'5mg'$  amlodipine in the treatment room.   She will start a protein supplement such as Boost or Ensure at least daily in an effort to put some weight back on.  [name] will return in 4 weeks for the start of cycle 5 of azacitidine. She understands and agree with this plan. She has been encouraged to call with any issues that might arise before her next visit here.    Laurie Panda, NP   09/04/2015 9:46 AM

## 2015-09-04 NOTE — Telephone Encounter (Signed)
appt made per 3/13 pof and avs printed

## 2015-09-04 NOTE — Patient Instructions (Signed)
[facility] Discharge Instructions for Patients Receiving Chemotherapy  Today you received the following chemotherapy agents: Vidaza   To help prevent nausea and vomiting after your treatment, we encourage you to take your nausea medication as directed.    If you develop nausea and vomiting that is not controlled by your nausea medication, call the clinic.   BELOW ARE SYMPTOMS THAT SHOULD BE REPORTED IMMEDIATELY:  *FEVER GREATER THAN 100.5 F  *CHILLS WITH OR WITHOUT FEVER  NAUSEA AND VOMITING THAT IS NOT CONTROLLED WITH YOUR NAUSEA MEDICATION  *UNUSUAL SHORTNESS OF BREATH  *UNUSUAL BRUISING OR BLEEDING  TENDERNESS IN MOUTH AND THROAT WITH OR WITHOUT PRESENCE OF ULCERS  *URINARY PROBLEMS  *BOWEL PROBLEMS  UNUSUAL RASH Items with * indicate a potential emergency and should be followed up as soon as possible.  Feel free to call the clinic you have any questions or concerns. The clinic phone number is [phone].  Please show the CHEMO ALERT CARD at check-in to the Emergency Department and triage nurse.   

## 2015-09-05 ENCOUNTER — Ambulatory Visit (HOSPITAL_BASED_OUTPATIENT_CLINIC_OR_DEPARTMENT_OTHER): Payer: Medicare Other

## 2015-09-05 VITALS — BP 146/62 | HR 68 | Temp 98.0°F | Resp 18

## 2015-09-05 DIAGNOSIS — C92Z0 Other myeloid leukemia not having achieved remission: Secondary | ICD-10-CM

## 2015-09-05 DIAGNOSIS — Z5111 Encounter for antineoplastic chemotherapy: Secondary | ICD-10-CM

## 2015-09-05 DIAGNOSIS — C9200 Acute myeloblastic leukemia, not having achieved remission: Secondary | ICD-10-CM

## 2015-09-05 DIAGNOSIS — D61818 Other pancytopenia: Secondary | ICD-10-CM

## 2015-09-05 MED ORDER — ONDANSETRON HCL 8 MG PO TABS
ORAL_TABLET | ORAL | Status: AC
  Filled 2015-09-05: qty 1

## 2015-09-05 MED ORDER — ONDANSETRON HCL 8 MG PO TABS
8.0000 mg | ORAL_TABLET | Freq: Once | ORAL | Status: AC
  Administered 2015-09-05: 8 mg via ORAL

## 2015-09-05 MED ORDER — AZACITIDINE CHEMO SQ INJECTION
75.0000 mg/m2 | Freq: Once | INTRAMUSCULAR | Status: AC
  Administered 2015-09-05: 140 mg via SUBCUTANEOUS
  Filled 2015-09-05: qty 5.6

## 2015-09-05 NOTE — Patient Instructions (Signed)
[facility] Discharge Instructions for Patients Receiving Chemotherapy  Today you received the following chemotherapy agents Vidaza  To help prevent nausea and vomiting after your treatment, we encourage you to take your nausea medication as prescribed.    If you develop nausea and vomiting that is not controlled by your nausea medication, call the clinic.   BELOW ARE SYMPTOMS THAT SHOULD BE REPORTED IMMEDIATELY:  *FEVER GREATER THAN 100.5 F  *CHILLS WITH OR WITHOUT FEVER  NAUSEA AND VOMITING THAT IS NOT CONTROLLED WITH YOUR NAUSEA MEDICATION  *UNUSUAL SHORTNESS OF BREATH  *UNUSUAL BRUISING OR BLEEDING  TENDERNESS IN MOUTH AND THROAT WITH OR WITHOUT PRESENCE OF ULCERS  *URINARY PROBLEMS  *BOWEL PROBLEMS  UNUSUAL RASH Items with * indicate a potential emergency and should be followed up as soon as possible.  Feel free to call the clinic you have any questions or concerns. The clinic phone number is [phone].  Please show the CHEMO ALERT CARD at check-in to the Emergency Department and triage nurse.   

## 2015-09-05 NOTE — Progress Notes (Signed)
Pt BP elevated upon arrival to infusion.  Pt states she did take her BP medications at 0830.  Rechecked BP manually at end of treatment and found to be 146/62.  Pt without complaints and told to call us if she notices any other changes.  Pt will return tomorrow for treatment.

## 2015-09-06 ENCOUNTER — Ambulatory Visit (HOSPITAL_BASED_OUTPATIENT_CLINIC_OR_DEPARTMENT_OTHER): Payer: Medicare Other

## 2015-09-06 VITALS — BP 162/65 | HR 72 | Temp 97.7°F | Resp 18

## 2015-09-06 DIAGNOSIS — Z5111 Encounter for antineoplastic chemotherapy: Secondary | ICD-10-CM

## 2015-09-06 DIAGNOSIS — C92Z0 Other myeloid leukemia not having achieved remission: Secondary | ICD-10-CM | POA: Diagnosis not present

## 2015-09-06 DIAGNOSIS — D61818 Other pancytopenia: Secondary | ICD-10-CM

## 2015-09-06 DIAGNOSIS — C9200 Acute myeloblastic leukemia, not having achieved remission: Secondary | ICD-10-CM

## 2015-09-06 MED ORDER — AZACITIDINE CHEMO SQ INJECTION
75.0000 mg/m2 | Freq: Once | INTRAMUSCULAR | Status: AC
  Administered 2015-09-06: 140 mg via SUBCUTANEOUS
  Filled 2015-09-06: qty 5.6

## 2015-09-06 MED ORDER — ONDANSETRON HCL 8 MG PO TABS
ORAL_TABLET | ORAL | Status: AC
  Filled 2015-09-06: qty 1

## 2015-09-06 MED ORDER — ONDANSETRON HCL 8 MG PO TABS
8.0000 mg | ORAL_TABLET | Freq: Once | ORAL | Status: AC
  Administered 2015-09-06: 8 mg via ORAL

## 2015-09-06 NOTE — Patient Instructions (Signed)
[facility] Discharge Instructions for Patients Receiving Chemotherapy  Today you received the following chemotherapy agents Vidaza  To help prevent nausea and vomiting after your treatment, we encourage you to take your nausea medication   If you develop nausea and vomiting that is not controlled by your nausea medication, call the clinic.   BELOW ARE SYMPTOMS THAT SHOULD BE REPORTED IMMEDIATELY:  *FEVER GREATER THAN 100.5 F  *CHILLS WITH OR WITHOUT FEVER  NAUSEA AND VOMITING THAT IS NOT CONTROLLED WITH YOUR NAUSEA MEDICATION  *UNUSUAL SHORTNESS OF BREATH  *UNUSUAL BRUISING OR BLEEDING  TENDERNESS IN MOUTH AND THROAT WITH OR WITHOUT PRESENCE OF ULCERS  *URINARY PROBLEMS  *BOWEL PROBLEMS  UNUSUAL RASH Items with * indicate a potential emergency and should be followed up as soon as possible.  Feel free to call the clinic you have any questions or concerns. The clinic phone number is [phone].  Please show the CHEMO ALERT CARD at check-in to the Emergency Department and triage nurse.   

## 2015-09-07 ENCOUNTER — Ambulatory Visit: Payer: Medicare Other

## 2015-09-08 ENCOUNTER — Ambulatory Visit (HOSPITAL_BASED_OUTPATIENT_CLINIC_OR_DEPARTMENT_OTHER): Payer: Medicare Other

## 2015-09-08 ENCOUNTER — Ambulatory Visit: Payer: Medicare Other

## 2015-09-08 ENCOUNTER — Other Ambulatory Visit (HOSPITAL_BASED_OUTPATIENT_CLINIC_OR_DEPARTMENT_OTHER): Payer: Medicare Other

## 2015-09-08 VITALS — BP 156/65 | HR 71 | Temp 97.9°F | Resp 18

## 2015-09-08 DIAGNOSIS — C9200 Acute myeloblastic leukemia, not having achieved remission: Secondary | ICD-10-CM

## 2015-09-08 DIAGNOSIS — D61818 Other pancytopenia: Secondary | ICD-10-CM

## 2015-09-08 DIAGNOSIS — C92Z0 Other myeloid leukemia not having achieved remission: Secondary | ICD-10-CM

## 2015-09-08 DIAGNOSIS — D693 Immune thrombocytopenic purpura: Secondary | ICD-10-CM | POA: Diagnosis not present

## 2015-09-08 DIAGNOSIS — Z5111 Encounter for antineoplastic chemotherapy: Secondary | ICD-10-CM

## 2015-09-08 DIAGNOSIS — D696 Thrombocytopenia, unspecified: Secondary | ICD-10-CM

## 2015-09-08 LAB — CBC WITH DIFFERENTIAL/PLATELET
BASO%: 1.4 % (ref 0.0–2.0)
Basophils Absolute: 0.1 10*3/uL (ref 0.0–0.1)
EOS%: 2.3 % (ref 0.0–7.0)
Eosinophils Absolute: 0.1 10*3/uL (ref 0.0–0.5)
HEMATOCRIT: 36.7 % (ref 34.8–46.6)
HEMOGLOBIN: 12.1 g/dL (ref 11.6–15.9)
LYMPH#: 1.2 10*3/uL (ref 0.9–3.3)
LYMPH%: 27.8 % (ref 14.0–49.7)
MCH: 32.2 pg (ref 25.1–34.0)
MCHC: 33.1 g/dL (ref 31.5–36.0)
MCV: 97.3 fL (ref 79.5–101.0)
MONO#: 0.2 10*3/uL (ref 0.1–0.9)
MONO%: 5.5 % (ref 0.0–14.0)
NEUT%: 63 % (ref 38.4–76.8)
NEUTROS ABS: 2.8 10*3/uL (ref 1.5–6.5)
Platelets: 379 10*3/uL (ref 145–400)
RBC: 3.77 10*6/uL (ref 3.70–5.45)
RDW: 16.7 % — AB (ref 11.2–14.5)
WBC: 4.4 10*3/uL (ref 3.9–10.3)

## 2015-09-08 LAB — TECHNOLOGIST REVIEW

## 2015-09-08 LAB — CHCC SMEAR

## 2015-09-08 MED ORDER — ONDANSETRON HCL 8 MG PO TABS
8.0000 mg | ORAL_TABLET | Freq: Once | ORAL | Status: AC
  Administered 2015-09-08: 8 mg via ORAL

## 2015-09-08 MED ORDER — AZACITIDINE CHEMO SQ INJECTION
75.0000 mg/m2 | Freq: Once | INTRAMUSCULAR | Status: AC
  Administered 2015-09-08: 140 mg via SUBCUTANEOUS
  Filled 2015-09-08: qty 5.6

## 2015-09-08 MED ORDER — ONDANSETRON HCL 8 MG PO TABS
ORAL_TABLET | ORAL | Status: AC
  Filled 2015-09-08: qty 1

## 2015-09-08 MED ORDER — ROMIPLOSTIM 250 MCG ~~LOC~~ SOLR
60.0000 ug | Freq: Once | SUBCUTANEOUS | Status: AC
  Administered 2015-09-08: 60 ug via SUBCUTANEOUS
  Filled 2015-09-08: qty 0.12

## 2015-09-08 NOTE — Patient Instructions (Signed)
[facility] Discharge Instructions for Patients Receiving Chemotherapy  Today you received the following chemotherapy agents: Vidaza   To help prevent nausea and vomiting after your treatment, we encourage you to take your nausea medication as directed.    If you develop nausea and vomiting that is not controlled by your nausea medication, call the clinic.   BELOW ARE SYMPTOMS THAT SHOULD BE REPORTED IMMEDIATELY:  *FEVER GREATER THAN 100.5 F  *CHILLS WITH OR WITHOUT FEVER  NAUSEA AND VOMITING THAT IS NOT CONTROLLED WITH YOUR NAUSEA MEDICATION  *UNUSUAL SHORTNESS OF BREATH  *UNUSUAL BRUISING OR BLEEDING  TENDERNESS IN MOUTH AND THROAT WITH OR WITHOUT PRESENCE OF ULCERS  *URINARY PROBLEMS  *BOWEL PROBLEMS  UNUSUAL RASH Items with * indicate a potential emergency and should be followed up as soon as possible.  Feel free to call the clinic you have any questions or concerns. The clinic phone number is [phone].  Please show the CHEMO ALERT CARD at check-in to the Emergency Department and triage nurse.   

## 2015-09-11 ENCOUNTER — Ambulatory Visit (HOSPITAL_BASED_OUTPATIENT_CLINIC_OR_DEPARTMENT_OTHER): Payer: Medicare Other

## 2015-09-11 VITALS — BP 148/78 | HR 72 | Temp 97.5°F | Resp 18

## 2015-09-11 DIAGNOSIS — C92Z0 Other myeloid leukemia not having achieved remission: Secondary | ICD-10-CM

## 2015-09-11 DIAGNOSIS — C9200 Acute myeloblastic leukemia, not having achieved remission: Secondary | ICD-10-CM

## 2015-09-11 DIAGNOSIS — Z5111 Encounter for antineoplastic chemotherapy: Secondary | ICD-10-CM | POA: Diagnosis not present

## 2015-09-11 DIAGNOSIS — D61818 Other pancytopenia: Secondary | ICD-10-CM

## 2015-09-11 MED ORDER — AZACITIDINE CHEMO SQ INJECTION
75.0000 mg/m2 | Freq: Once | INTRAMUSCULAR | Status: AC
  Administered 2015-09-11: 140 mg via SUBCUTANEOUS
  Filled 2015-09-11: qty 5.6

## 2015-09-11 MED ORDER — ONDANSETRON HCL 8 MG PO TABS
8.0000 mg | ORAL_TABLET | Freq: Once | ORAL | Status: AC
  Administered 2015-09-11: 8 mg via ORAL

## 2015-09-11 MED ORDER — ONDANSETRON HCL 8 MG PO TABS
ORAL_TABLET | ORAL | Status: AC
  Filled 2015-09-11: qty 1

## 2015-09-11 NOTE — Patient Instructions (Signed)
[facility] Discharge Instructions for Patients Receiving Chemotherapy  Today you received the following chemotherapy agents: Vidaza   To help prevent nausea and vomiting after your treatment, we encourage you to take your nausea medication as directed.    If you develop nausea and vomiting that is not controlled by your nausea medication, call the clinic.   BELOW ARE SYMPTOMS THAT SHOULD BE REPORTED IMMEDIATELY:  *FEVER GREATER THAN 100.5 F  *CHILLS WITH OR WITHOUT FEVER  NAUSEA AND VOMITING THAT IS NOT CONTROLLED WITH YOUR NAUSEA MEDICATION  *UNUSUAL SHORTNESS OF BREATH  *UNUSUAL BRUISING OR BLEEDING  TENDERNESS IN MOUTH AND THROAT WITH OR WITHOUT PRESENCE OF ULCERS  *URINARY PROBLEMS  *BOWEL PROBLEMS  UNUSUAL RASH Items with * indicate a potential emergency and should be followed up as soon as possible.  Feel free to call the clinic you have any questions or concerns. The clinic phone number is [phone].  Please show the CHEMO ALERT CARD at check-in to the Emergency Department and triage nurse.   

## 2015-09-12 ENCOUNTER — Ambulatory Visit (HOSPITAL_BASED_OUTPATIENT_CLINIC_OR_DEPARTMENT_OTHER): Payer: Medicare Other

## 2015-09-12 VITALS — BP 146/62 | HR 71 | Temp 98.6°F | Resp 18

## 2015-09-12 DIAGNOSIS — Z5111 Encounter for antineoplastic chemotherapy: Secondary | ICD-10-CM

## 2015-09-12 DIAGNOSIS — C92Z0 Other myeloid leukemia not having achieved remission: Secondary | ICD-10-CM

## 2015-09-12 DIAGNOSIS — C9200 Acute myeloblastic leukemia, not having achieved remission: Secondary | ICD-10-CM

## 2015-09-12 DIAGNOSIS — D61818 Other pancytopenia: Secondary | ICD-10-CM

## 2015-09-12 MED ORDER — ONDANSETRON HCL 8 MG PO TABS
ORAL_TABLET | ORAL | Status: AC
  Filled 2015-09-12: qty 1

## 2015-09-12 MED ORDER — AZACITIDINE CHEMO SQ INJECTION
75.0000 mg/m2 | Freq: Once | INTRAMUSCULAR | Status: AC
  Administered 2015-09-12: 140 mg via SUBCUTANEOUS
  Filled 2015-09-12: qty 5.6

## 2015-09-12 MED ORDER — ONDANSETRON HCL 8 MG PO TABS
8.0000 mg | ORAL_TABLET | Freq: Once | ORAL | Status: AC
  Administered 2015-09-12: 8 mg via ORAL

## 2015-09-12 NOTE — Patient Instructions (Signed)
[facility] Discharge Instructions for Patients Receiving Chemotherapy  Today you received the following chemotherapy agents: Vidaza   To help prevent nausea and vomiting after your treatment, we encourage you to take your nausea medication as directed.    If you develop nausea and vomiting that is not controlled by your nausea medication, call the clinic.   BELOW ARE SYMPTOMS THAT SHOULD BE REPORTED IMMEDIATELY:  *FEVER GREATER THAN 100.5 F  *CHILLS WITH OR WITHOUT FEVER  NAUSEA AND VOMITING THAT IS NOT CONTROLLED WITH YOUR NAUSEA MEDICATION  *UNUSUAL SHORTNESS OF BREATH  *UNUSUAL BRUISING OR BLEEDING  TENDERNESS IN MOUTH AND THROAT WITH OR WITHOUT PRESENCE OF ULCERS  *URINARY PROBLEMS  *BOWEL PROBLEMS  UNUSUAL RASH Items with * indicate a potential emergency and should be followed up as soon as possible.  Feel free to call the clinic you have any questions or concerns. The clinic phone number is [phone].  Please show the CHEMO ALERT CARD at check-in to the Emergency Department and triage nurse.   

## 2015-09-15 ENCOUNTER — Ambulatory Visit (HOSPITAL_BASED_OUTPATIENT_CLINIC_OR_DEPARTMENT_OTHER): Payer: Medicare Other

## 2015-09-15 ENCOUNTER — Other Ambulatory Visit (HOSPITAL_BASED_OUTPATIENT_CLINIC_OR_DEPARTMENT_OTHER): Payer: Medicare Other

## 2015-09-15 VITALS — BP 109/61 | HR 76 | Temp 98.2°F

## 2015-09-15 DIAGNOSIS — D693 Immune thrombocytopenic purpura: Secondary | ICD-10-CM

## 2015-09-15 DIAGNOSIS — D61818 Other pancytopenia: Secondary | ICD-10-CM

## 2015-09-15 DIAGNOSIS — C9200 Acute myeloblastic leukemia, not having achieved remission: Secondary | ICD-10-CM

## 2015-09-15 DIAGNOSIS — D696 Thrombocytopenia, unspecified: Secondary | ICD-10-CM

## 2015-09-15 LAB — CBC WITH DIFFERENTIAL/PLATELET
BASO%: 0.3 % (ref 0.0–2.0)
BASOS ABS: 0 10*3/uL (ref 0.0–0.1)
EOS ABS: 0.3 10*3/uL (ref 0.0–0.5)
EOS%: 4.3 % (ref 0.0–7.0)
HCT: 37.6 % (ref 34.8–46.6)
HGB: 12.6 g/dL (ref 11.6–15.9)
LYMPH%: 22.7 % (ref 14.0–49.7)
MCH: 32.1 pg (ref 25.1–34.0)
MCHC: 33.5 g/dL (ref 31.5–36.0)
MCV: 95.9 fL (ref 79.5–101.0)
MONO#: 0.4 10*3/uL (ref 0.1–0.9)
MONO%: 6 % (ref 0.0–14.0)
NEUT%: 66.7 % (ref 38.4–76.8)
NEUTROS ABS: 4.5 10*3/uL (ref 1.5–6.5)
Platelets: 307 10*3/uL (ref 145–400)
RBC: 3.92 10*6/uL (ref 3.70–5.45)
RDW: 14.8 % — ABNORMAL HIGH (ref 11.2–14.5)
WBC: 6.7 10*3/uL (ref 3.9–10.3)
lymph#: 1.5 10*3/uL (ref 0.9–3.3)

## 2015-09-15 LAB — CHCC SMEAR

## 2015-09-15 MED ORDER — ROMIPLOSTIM 250 MCG ~~LOC~~ SOLR
60.0000 ug | Freq: Once | SUBCUTANEOUS | Status: AC
  Administered 2015-09-15: 60 ug via SUBCUTANEOUS
  Filled 2015-09-15: qty 0.12

## 2015-09-22 ENCOUNTER — Other Ambulatory Visit (HOSPITAL_BASED_OUTPATIENT_CLINIC_OR_DEPARTMENT_OTHER): Payer: Medicare Other

## 2015-09-22 ENCOUNTER — Ambulatory Visit (HOSPITAL_BASED_OUTPATIENT_CLINIC_OR_DEPARTMENT_OTHER): Payer: Medicare Other

## 2015-09-22 VITALS — BP 149/91 | HR 75 | Temp 98.3°F | Resp 18

## 2015-09-22 DIAGNOSIS — D693 Immune thrombocytopenic purpura: Secondary | ICD-10-CM

## 2015-09-22 DIAGNOSIS — C9200 Acute myeloblastic leukemia, not having achieved remission: Secondary | ICD-10-CM

## 2015-09-22 DIAGNOSIS — D61818 Other pancytopenia: Secondary | ICD-10-CM

## 2015-09-22 DIAGNOSIS — C92Z0 Other myeloid leukemia not having achieved remission: Secondary | ICD-10-CM

## 2015-09-22 DIAGNOSIS — D696 Thrombocytopenia, unspecified: Secondary | ICD-10-CM

## 2015-09-22 LAB — CBC WITH DIFFERENTIAL/PLATELET
BASO%: 0.2 % (ref 0.0–2.0)
Basophils Absolute: 0 10*3/uL (ref 0.0–0.1)
EOS%: 2.7 % (ref 0.0–7.0)
Eosinophils Absolute: 0.2 10*3/uL (ref 0.0–0.5)
HEMATOCRIT: 37.5 % (ref 34.8–46.6)
HEMOGLOBIN: 12.6 g/dL (ref 11.6–15.9)
LYMPH#: 1.6 10*3/uL (ref 0.9–3.3)
LYMPH%: 24.3 % (ref 14.0–49.7)
MCH: 31.9 pg (ref 25.1–34.0)
MCHC: 33.6 g/dL (ref 31.5–36.0)
MCV: 94.9 fL (ref 79.5–101.0)
MONO#: 0.7 10*3/uL (ref 0.1–0.9)
MONO%: 10.7 % (ref 0.0–14.0)
NEUT%: 62.1 % (ref 38.4–76.8)
NEUTROS ABS: 4 10*3/uL (ref 1.5–6.5)
PLATELETS: 206 10*3/uL (ref 145–400)
RBC: 3.95 10*6/uL (ref 3.70–5.45)
RDW: 14.6 % — AB (ref 11.2–14.5)
WBC: 6.4 10*3/uL (ref 3.9–10.3)

## 2015-09-22 LAB — CHCC SMEAR

## 2015-09-22 MED ORDER — ROMIPLOSTIM 250 MCG ~~LOC~~ SOLR
60.0000 ug | Freq: Once | SUBCUTANEOUS | Status: AC
  Administered 2015-09-22: 60 ug via SUBCUTANEOUS
  Filled 2015-09-22: qty 0.12

## 2015-09-22 NOTE — Patient Instructions (Signed)
Romiplostim injection What is this medicine? ROMIPLOSTIM (roe mi PLOE stim) helps your body make more platelets. This medicine is used to treat low platelets caused by chronic idiopathic thrombocytopenic purpura (ITP). This medicine may be used for other purposes; ask your health care provider or pharmacist if you have questions. What should I tell my health care provider before I take this medicine? They need to know if you have any of these conditions: -cancer or myelodysplastic syndrome -low blood counts, like low white cell, platelet, or red cell counts -take medicines that treat or prevent blood clots -an unusual or allergic reaction to romiplostim, mannitol, other medicines, foods, dyes, or preservatives -pregnant or trying to get pregnant -breast-feeding How should I use this medicine? This medicine is for injection under the skin. It is given by a health care professional in a hospital or clinic setting. A special MedGuide will be given to you before your injection. Read this information carefully each time. Talk to your pediatrician regarding the use of this medicine in children. Special care may be needed. Overdosage: If you think you have taken too much of this medicine contact a poison control center or emergency room at once. NOTE: This medicine is only for you. Do not share this medicine with others. What if I miss a dose? It is important not to miss your dose. Call your doctor or health care professional if you are unable to keep an appointment. What may interact with this medicine? Interactions are not expected. This list may not describe all possible interactions. Give your health care provider a list of all the medicines, herbs, non-prescription drugs, or dietary supplements you use. Also tell them if you smoke, drink alcohol, or use illegal drugs. Some items may interact with your medicine. What should I watch for while using this medicine? Your condition will be monitored  carefully while you are receiving this medicine. Visit your prescriber or health care professional for regular checks on your progress and for the needed blood tests. It is important to keep all appointments. What side effects may I notice from receiving this medicine? Side effects that you should report to your doctor or health care professional as soon as possible: -allergic reactions like skin rash, itching or hives, swelling of the face, lips, or tongue -shortness of breath, chest pain, swelling in a leg -unusual bleeding or bruising Side effects that usually do not require medical attention (report to your doctor or health care professional if they continue or are bothersome): -dizziness -headache -muscle aches -pain in arms and legs -stomach pain -trouble sleeping This list may not describe all possible side effects. Call your doctor for medical advice about side effects. You may report side effects to FDA at 1-800-FDA-1088. Where should I keep my medicine? This drug is given in a hospital or clinic and will not be stored at home. NOTE: This sheet is a summary. It may not cover all possible information. If you have questions about this medicine, talk to your doctor, pharmacist, or health care provider.    2016, Elsevier/Gold Standard. (2008-02-08 15:13:04)  

## 2015-09-25 ENCOUNTER — Encounter: Payer: Self-pay | Admitting: Oncology

## 2015-09-25 NOTE — Progress Notes (Signed)
fmla faxed 07/04/15 I sent to medical records

## 2015-09-29 ENCOUNTER — Other Ambulatory Visit (HOSPITAL_BASED_OUTPATIENT_CLINIC_OR_DEPARTMENT_OTHER): Payer: Medicare Other

## 2015-09-29 ENCOUNTER — Ambulatory Visit (HOSPITAL_BASED_OUTPATIENT_CLINIC_OR_DEPARTMENT_OTHER): Payer: Medicare Other

## 2015-09-29 VITALS — BP 134/80 | HR 77 | Temp 97.9°F

## 2015-09-29 DIAGNOSIS — D61818 Other pancytopenia: Secondary | ICD-10-CM

## 2015-09-29 DIAGNOSIS — C92Z0 Other myeloid leukemia not having achieved remission: Secondary | ICD-10-CM | POA: Diagnosis not present

## 2015-09-29 DIAGNOSIS — C9200 Acute myeloblastic leukemia, not having achieved remission: Secondary | ICD-10-CM

## 2015-09-29 DIAGNOSIS — D696 Thrombocytopenia, unspecified: Secondary | ICD-10-CM

## 2015-09-29 DIAGNOSIS — D693 Immune thrombocytopenic purpura: Secondary | ICD-10-CM

## 2015-09-29 LAB — COMPREHENSIVE METABOLIC PANEL
ALBUMIN: 3.3 g/dL — AB (ref 3.5–5.0)
ALK PHOS: 82 U/L (ref 40–150)
ALT: 13 U/L (ref 0–55)
ANION GAP: 10 meq/L (ref 3–11)
AST: 22 U/L (ref 5–34)
BUN: 13.9 mg/dL (ref 7.0–26.0)
CALCIUM: 9.5 mg/dL (ref 8.4–10.4)
CHLORIDE: 103 meq/L (ref 98–109)
CO2: 27 mEq/L (ref 22–29)
Creatinine: 0.9 mg/dL (ref 0.6–1.1)
EGFR: 65 mL/min/{1.73_m2} — ABNORMAL LOW (ref >90)
Glucose: 85 mg/dl (ref 70–140)
POTASSIUM: 3.5 meq/L (ref 3.5–5.1)
Sodium: 140 mEq/L (ref 136–145)
Total Bilirubin: 0.43 mg/dL (ref 0.20–1.20)
Total Protein: 6.9 g/dL (ref 6.4–8.3)

## 2015-09-29 LAB — CBC WITH DIFFERENTIAL/PLATELET
BASO%: 0.6 % (ref 0.0–2.0)
BASOS ABS: 0 10*3/uL (ref 0.0–0.1)
EOS ABS: 0.1 10*3/uL (ref 0.0–0.5)
EOS%: 1.6 % (ref 0.0–7.0)
HEMATOCRIT: 38.5 % (ref 34.8–46.6)
HEMOGLOBIN: 12.8 g/dL (ref 11.6–15.9)
LYMPH#: 1.1 10*3/uL (ref 0.9–3.3)
LYMPH%: 21.6 % (ref 14.0–49.7)
MCH: 31.9 pg (ref 25.1–34.0)
MCHC: 33.3 g/dL (ref 31.5–36.0)
MCV: 95.9 fL (ref 79.5–101.0)
MONO#: 0.3 10*3/uL (ref 0.1–0.9)
MONO%: 4.8 % (ref 0.0–14.0)
NEUT#: 3.8 10*3/uL (ref 1.5–6.5)
NEUT%: 71.4 % (ref 38.4–76.8)
PLATELETS: 293 10*3/uL (ref 145–400)
RBC: 4.01 10*6/uL (ref 3.70–5.45)
RDW: 15.2 % — ABNORMAL HIGH (ref 11.2–14.5)
WBC: 5.3 10*3/uL (ref 3.9–10.3)

## 2015-09-29 LAB — CHCC SMEAR

## 2015-09-29 MED ORDER — ROMIPLOSTIM 250 MCG ~~LOC~~ SOLR
60.0000 ug | Freq: Once | SUBCUTANEOUS | Status: AC
  Administered 2015-09-29: 60 ug via SUBCUTANEOUS
  Filled 2015-09-29: qty 0.12

## 2015-10-01 ENCOUNTER — Other Ambulatory Visit: Payer: Self-pay | Admitting: Cardiology

## 2015-10-02 ENCOUNTER — Other Ambulatory Visit (HOSPITAL_BASED_OUTPATIENT_CLINIC_OR_DEPARTMENT_OTHER): Payer: Medicare Other

## 2015-10-02 ENCOUNTER — Other Ambulatory Visit: Payer: Self-pay | Admitting: Oncology

## 2015-10-02 ENCOUNTER — Encounter: Payer: Self-pay | Admitting: Nurse Practitioner

## 2015-10-02 ENCOUNTER — Ambulatory Visit (HOSPITAL_BASED_OUTPATIENT_CLINIC_OR_DEPARTMENT_OTHER): Payer: Medicare Other

## 2015-10-02 ENCOUNTER — Ambulatory Visit (HOSPITAL_BASED_OUTPATIENT_CLINIC_OR_DEPARTMENT_OTHER): Payer: Medicare Other | Admitting: Nurse Practitioner

## 2015-10-02 VITALS — BP 160/73 | HR 62 | Temp 97.6°F | Resp 17 | Ht 68.0 in | Wt 149.4 lb

## 2015-10-02 DIAGNOSIS — C9200 Acute myeloblastic leukemia, not having achieved remission: Secondary | ICD-10-CM

## 2015-10-02 DIAGNOSIS — Z5111 Encounter for antineoplastic chemotherapy: Secondary | ICD-10-CM | POA: Diagnosis not present

## 2015-10-02 DIAGNOSIS — C92Z0 Other myeloid leukemia not having achieved remission: Secondary | ICD-10-CM

## 2015-10-02 DIAGNOSIS — D693 Immune thrombocytopenic purpura: Secondary | ICD-10-CM

## 2015-10-02 DIAGNOSIS — D61818 Other pancytopenia: Secondary | ICD-10-CM

## 2015-10-02 DIAGNOSIS — N6459 Other signs and symptoms in breast: Secondary | ICD-10-CM | POA: Diagnosis not present

## 2015-10-02 DIAGNOSIS — N644 Mastodynia: Secondary | ICD-10-CM

## 2015-10-02 LAB — CBC WITH DIFFERENTIAL/PLATELET
BASO%: 0.8 % (ref 0.0–2.0)
Basophils Absolute: 0 10*3/uL (ref 0.0–0.1)
EOS ABS: 0.1 10*3/uL (ref 0.0–0.5)
EOS%: 1.9 % (ref 0.0–7.0)
HEMATOCRIT: 37.5 % (ref 34.8–46.6)
HEMOGLOBIN: 12.4 g/dL (ref 11.6–15.9)
LYMPH%: 29.4 % (ref 14.0–49.7)
MCH: 31.7 pg (ref 25.1–34.0)
MCHC: 32.9 g/dL (ref 31.5–36.0)
MCV: 96.4 fL (ref 79.5–101.0)
MONO#: 0.2 10*3/uL (ref 0.1–0.9)
MONO%: 4.6 % (ref 0.0–14.0)
NEUT%: 63.3 % (ref 38.4–76.8)
NEUTROS ABS: 3.4 10*3/uL (ref 1.5–6.5)
PLATELETS: 366 10*3/uL (ref 145–400)
RBC: 3.89 10*6/uL (ref 3.70–5.45)
RDW: 15.2 % — AB (ref 11.2–14.5)
WBC: 5.4 10*3/uL (ref 3.9–10.3)
lymph#: 1.6 10*3/uL (ref 0.9–3.3)

## 2015-10-02 MED ORDER — ONDANSETRON HCL 8 MG PO TABS
8.0000 mg | ORAL_TABLET | Freq: Once | ORAL | Status: AC
  Administered 2015-10-02: 8 mg via ORAL

## 2015-10-02 MED ORDER — AZACITIDINE CHEMO SQ INJECTION
75.0000 mg/m2 | Freq: Once | INTRAMUSCULAR | Status: AC
  Administered 2015-10-02: 140 mg via SUBCUTANEOUS
  Filled 2015-10-02: qty 5.6

## 2015-10-02 MED ORDER — ONDANSETRON HCL 8 MG PO TABS
ORAL_TABLET | ORAL | Status: AC
  Filled 2015-10-02: qty 1

## 2015-10-02 NOTE — Telephone Encounter (Signed)
Rx request sent to pharmacy.  

## 2015-10-02 NOTE — Patient Instructions (Signed)
[facility] Discharge Instructions for Patients Receiving Chemotherapy  Today you received the following chemotherapy agents vidaza.  To help prevent nausea and vomiting after your treatment, we encourage you to take your nausea medication as prescribed.   If you develop nausea and vomiting that is not controlled by your nausea medication, call the clinic.   BELOW ARE SYMPTOMS THAT SHOULD BE REPORTED IMMEDIATELY:  *FEVER GREATER THAN 100.5 F  *CHILLS WITH OR WITHOUT FEVER  NAUSEA AND VOMITING THAT IS NOT CONTROLLED WITH YOUR NAUSEA MEDICATION  *UNUSUAL SHORTNESS OF BREATH  *UNUSUAL BRUISING OR BLEEDING  TENDERNESS IN MOUTH AND THROAT WITH OR WITHOUT PRESENCE OF ULCERS  *URINARY PROBLEMS  *BOWEL PROBLEMS  UNUSUAL RASH Items with * indicate a potential emergency and should be followed up as soon as possible.  Feel free to call the clinic you have any questions or concerns. The clinic phone number is [phone].  Please show the CHEMO ALERT CARD at check-in to the Emergency Department and triage nurse.   

## 2015-10-02 NOTE — Progress Notes (Signed)
[place]  [phone] [phone]   ID: JONIQUA SIDLE DOB: [birth year]  MR#: [id number]  GLO#:[id number]  Patient Care Team: [facility], FNP as PCP - General (Family Medicine) PCP: [name], FNP, [name] GYN: SU:  OTHER MD: [name], [name], [name]  CHIEF COMPLAINT: Acute myeloid leukemia; ITP  CURRENT TREATMENT: Azacitidine, romiplostim  HISTORY OF PRESENT ILLNESS: From the 05/22/2025 consult note:  "The patient was evaluated at her PCP's office 05/22/2015 with a complaint of DOE worsening over several months. Labwork was obtained including a CBC, whioch showed WBC 1.8, platelets 14K, and Hb 5.9 with an MCV of 97.5. DDimer was 1.04. Accordingly the patient was referred to the ED and was admitted 05/23/2015.   Labwork since admission includes a repeat CBC with WBC 1.7, HB 5.4, MCV 100.6 and platelets 10K. Creat was 1.04 with GFR 50. Reticulocytes are not elevated with abs 39.3 (1.7%) and LDH is normal at 188. Other labs are pending.  The patient tells me because of peripheral neuropathy she has been receiving monthly B-12 shots since 2005."  Bone marrow biopsy 05/25/2015 showed acute myeloid leukemia, with 17% blasts by flow cytometry, 24% by aspirate counts, and 20-30% by immunohistochemistry ([id number], and [id number]). The blasts were positive for CD 117 and focally for MPO. There were also myelodysplasia related changes in all 3 cell lines.  The patient's subsequent history is as detailed below.  INTERVAL HISTORY: Sherryl returns today for follow-up of her myelodysplasia/acute myeloid leukemia, accompanied by her husband.  Today is day 1 cycle 5 of azacitidine. She is also receiving romiplostim weekly depending on her platelet count. She denies abnormal bleeding or bruising, and has maintained normal counts for several weeks now.   REVIEW OF SYSTEMS: [place] is doing well overall. Her energy level is good.  She denies fevers, chills, nausea, vomiting, or changes in bowel or bladder habits. 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 She recently started valtrex daily. The area is pruritic, but not painful. She would like to examine her right breast. She feels some tenderness to the upper outer quadrant. A detailed review of systems is otherwise stable.  PAST MEDICAL HISTORY: Past Medical History  Diagnosis Date  . GERD (gastroesophageal reflux disease)   . Depression   . Hypertension   . Shortness of breath dyspnea     with exertion  . History of blood transfusion 05/23/2015    "Hgb 5.9"  . Anemia   . Arthritis     "hand joints; hips; back" (05/23/2015)  . Chronic lower back pain   . Anxiety     PAST SURGICAL HISTORY: Past Surgical History  Procedure Laterality Date  . Bone spur      R thigh  . Total shoulder arthroplasty  09/13/2011    Procedure: TOTAL SHOULDER ARTHROPLASTY;  Surgeon: Augustin Schooling, MD;  Location: [facility];  Service: Orthopedics;  Laterality: Left;  LEFT SHOULDER REVERSED TOTAL SHOULDER ARTHROPLASTY  . Total knee arthroplasty  04/27/2012    Procedure: TOTAL KNEE ARTHROPLASTY;  Surgeon: Mauri Pole, MD;  Location: [facility];  Service: Orthopedics;  Laterality: Right;  . Cataract extraction w/ intraocular lens  implant, bilateral  ~ 2005  . Tonsillectomy  ~ 1950  . Esophagogastroduodenoscopy (egd) with propofol N/A 11/08/2014    Procedure: ESOPHAGOGASTRODUODENOSCOPY (EGD) WITH PROPOFOL;  Surgeon: Garlan Fair, MD;  Location: [facility];  Service: Endoscopy;  Laterality: N/A;  . Joint replacement    . Dilation and curettage of uterus      FAMILY HISTORY Family History  Problem Relation Age of Onset  . Heart attack Mother 34    Died age 19  . Heart disease Brother     Atrial fib  Patient's father died age 79 with prostate cancer; mother died atv72 with an MI. One brother, alive at 38; one  sister with parkinson's. No blood or cancer problems otherwise in family  GYNECOLOGIC HISTORY:  No LMP recorded. Patient is postmenopausal. Menarche age 34, first live birth age 89, she is GXP3; menopause age 3, no HR  SOCIAL HISTORY:  Grade school Pharmacist, hospital, retired; husband was a Higher education careers adviser. He has severe heart disease and she is his main caretaker. Son [name] works for [employer] and sings in the [employer]; daughter [place] lives in [place], a homemaker; daughter [name] works at the surgical center in [employer] as an Therapist, sports. The patient has 7 gch. She is a Psychologist, forensic    ADVANCED DIRECTIVES: in place; the patient's son [name] is her [place]: Social History  Substance Use Topics  . Smoking status: Never Smoker   . Smokeless tobacco: Never Used  . Alcohol Use: No     Colonoscopy:  PAP:  Bone density:  Lipid panel:  Allergies  Allergen Reactions  . Cozaar [Losartan] Other (See Comments)    Cause pt to lose her taste for foods  . Codeine Itching and Other (See Comments)    Makes feel crazy  PT STATES SHE ALSO CAN NOT TAKE THE SYNTHETIC CODEINES  . Hydrocodone Itching    Tolerates with benadryl  . Oxycodone Itching  . Tetanus Toxoids Swelling and Rash    Current Outpatient Prescriptions  Medication Sig Dispense Refill  . amLODipine (NORVASC) 2.5 MG tablet Take 1 tablet (2.5 mg total) by mouth daily. 90 tablet 3  . Cyanocobalamin 1000 MCG/ML KIT Inject 1,000 mcg as directed every 30 (thirty) days. B 12 shot 10 kit 1  . hydrochlorothiazide (HYDRODIURIL) 25 MG tablet Take 1 tablet (25 mg total) by mouth daily. 90 tablet 0  . Melatonin 5 MG TABS Take 2.5 mg by mouth at bedtime.    . Multiple Vitamin (MULTIVITAMIN WITH MINERALS) TABS tablet Take 1 tablet by mouth daily.    . pantoprazole (PROTONIX) 40 MG tablet Take 40 mg by mouth every morning.    . valACYclovir (VALTREX) 1000 MG tablet Take 1,000 mg by mouth 2 (two) times daily.     . Venlafaxine HCl 150 MG TB24  TAKE ONE TABLET BY MOUTH ONCE DAILY 30 tablet 0  . Vitamin D, Ergocalciferol, (DRISDOL) 50000 units CAPS capsule Take 1 capsule (50,000 Units total) by mouth See admin instructions. Every other week. 30 capsule 1  . ondansetron (ZOFRAN) 8 MG tablet Take 1 tablet (8 mg total) by mouth 2 (two) times daily as needed for nausea or vomiting. (Patient not taking: Reported on 09/04/2015) 30 tablet 1   No current facility-administered medications for this visit.    OBJECTIVE: Older white woman who appears well Filed Vitals:   10/02/15 0943  BP: 160/73  Pulse: 62  Temp: 97.6 F (36.4 C)  Resp: 17     Body mass index is 22.72 kg/(m^2).    ECOG FS:1 - Symptomatic but completely ambulatory  Skin: warm, dry, erythematous papule to upper right cheek HEENT: sclerae anicteric, conjunctivae pink, oropharynx clear.  No thrush or mucositis.  Lymph Nodes: No cervical or supraclavicular lymphadenopathy  Lungs: clear to auscultation bilaterally, no rales, wheezes, or rhonci  Heart: regular rate and rhythm  Abdomen: round, soft, non tender, positive bowel sounds  Musculoskeletal: No focal spinal tenderness, no peripheral edema  Neuro: non focal, well oriented, positive affect  Breasts: no suspicious masses palpated to right breast. No skin or nipple changes. Right axilla benign. Left breast unremarkable.  LAB RESULTS:  CMP     Component Value Date/Time   NA 140 09/29/2015 1022   NA 138 05/25/2015 0332   K 3.5 09/29/2015 1022   K 4.1 05/25/2015 0332   CL 104 05/25/2015 0332   CO2 27 09/29/2015 1022   CO2 28 05/25/2015 0332   GLUCOSE 85 09/29/2015 1022   GLUCOSE 96 05/25/2015 0332   BUN 13.9 09/29/2015 1022   BUN 17 05/25/2015 0332   CREATININE 0.9 09/29/2015 1022   CREATININE 0.93 05/25/2015 0332   CALCIUM 9.5 09/29/2015 1022   CALCIUM 8.8* 05/25/2015 0332   PROT 6.9 09/29/2015 1022   PROT 6.3* 05/24/2015 0415   ALBUMIN 3.3* 09/29/2015 1022   ALBUMIN 3.4* 05/24/2015 0415   AST 22 09/29/2015  1022   AST 15 05/24/2015 0415   ALT 13 09/29/2015 1022   ALT 11* 05/24/2015 0415   ALKPHOS 82 09/29/2015 1022   ALKPHOS 56 05/24/2015 0415   BILITOT 0.43 09/29/2015 1022   BILITOT 1.3* 05/24/2015 0415   GFRNONAA 58* 05/25/2015 0332   GFRAA >60 05/25/2015 0332    INo results found for: SPEP, UPEP  Lab Results  Component Value Date   WBC 5.4 10/02/2015   NEUTROABS 3.4 10/02/2015   HGB 12.4 10/02/2015   HCT 37.5 10/02/2015   MCV 96.4 10/02/2015   PLT 366 10/02/2015      Chemistry      Component Value Date/Time   NA 140 09/29/2015 1022   NA 138 05/25/2015 0332   K 3.5 09/29/2015 1022   K 4.1 05/25/2015 0332   CL 104 05/25/2015 0332   CO2 27 09/29/2015 1022   CO2 28 05/25/2015 0332   BUN 13.9 09/29/2015 1022   BUN 17 05/25/2015 0332   CREATININE 0.9 09/29/2015 1022   CREATININE 0.93 05/25/2015 0332      Component Value Date/Time   CALCIUM 9.5 09/29/2015 1022   CALCIUM 8.8* 05/25/2015 0332   ALKPHOS 82 09/29/2015 1022   ALKPHOS 56 05/24/2015 0415   AST 22 09/29/2015 1022   AST 15 05/24/2015 0415   ALT 13 09/29/2015 1022   ALT 11* 05/24/2015 0415   BILITOT 0.43 09/29/2015 1022   BILITOT 1.3* 05/24/2015 0415       No results found for: LABCA2  No components found for: LABCA125  No results for input(s): INR in the last 168 hours.  Urinalysis    Component Value Date/Time   COLORURINE YELLOW 04/21/2012 0946   APPEARANCEUR CLOUDY* 04/21/2012 0946   LABSPEC 1.019 04/21/2012 0946   PHURINE 7.0 04/21/2012 0946   GLUCOSEU NEGATIVE 04/21/2012 0946   HGBUR NEGATIVE 04/21/2012 0946   BILIRUBINUR NEGATIVE 04/21/2012 0946   KETONESUR NEGATIVE 04/21/2012 0946   PROTEINUR NEGATIVE 04/21/2012 0946   UROBILINOGEN 1.0 04/21/2012 0946   NITRITE NEGATIVE 04/21/2012 0946   LEUKOCYTESUR NEGATIVE 04/21/2012 0946    STUDIES: No results found.  ASSESSMENT: 80 y.o. [place] woman with acute myeloid leukemia diagnosed by bone marrow biopsy 05/25/2015, with the blast  count between 17 and 30% depending on the method  of determination.   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, with gain of 2 and 19  (b) consider allogenic transplant  (c) all transfusion products to be irradiated  (1) started sQ azacitidine 06/05/2015, receiving 7 doses every 28 day cycle  (2) immune thrombocytopenic purpura  (a) s/p IVIG 07/06/2015 (1g/kg x 2d) with excellent response  (b) romiplostim first dose 07/07/2015, started weekly as of 08/04/2015  PLAN:  The labs were reviewed in detail and all pertinent values continue to be within normal limits. She has responded well so far to treatment. I reviewed Dr. Lyman Speller note from [facility] visit to [facility]. Right now she suggests continuing with the current treatment. She would likley not benefit any more in a survival sense with a transplant. Thus, Ayah will proceed with cycle 5 of daily azacitidine as planned today. She will continue romiplostim injections every Friday as required. I have asked the scheduler to move up her appointment this Friday to allow her more time with her grandchildren this weekend.  I did not palpate any irregularity to Shaneil's right breast. She is overdue for a mammogram however, so I have placed orders for this to be performed at [facility].   Blaze will return in 4 weeks for the start of cycle 6 with Dr. Jana Hakim. She understands and agrees with this plan. She has been encouraged to call with any issues that might arise before her next visit here.    Laurie Panda, NP   10/02/2015 10:28 AM

## 2015-10-02 NOTE — Addendum Note (Signed)
Addended by: Marcelino Duster on: 10/02/2015 03:07 PM   Modules accepted: Orders

## 2015-10-03 ENCOUNTER — Ambulatory Visit (HOSPITAL_BASED_OUTPATIENT_CLINIC_OR_DEPARTMENT_OTHER): Payer: Medicare Other

## 2015-10-03 VITALS — BP 159/72 | HR 75 | Temp 98.0°F | Resp 17

## 2015-10-03 DIAGNOSIS — Z5111 Encounter for antineoplastic chemotherapy: Secondary | ICD-10-CM | POA: Diagnosis not present

## 2015-10-03 DIAGNOSIS — C92Z0 Other myeloid leukemia not having achieved remission: Secondary | ICD-10-CM | POA: Diagnosis not present

## 2015-10-03 DIAGNOSIS — C9200 Acute myeloblastic leukemia, not having achieved remission: Secondary | ICD-10-CM

## 2015-10-03 DIAGNOSIS — D61818 Other pancytopenia: Secondary | ICD-10-CM

## 2015-10-03 MED ORDER — ONDANSETRON HCL 8 MG PO TABS
ORAL_TABLET | ORAL | Status: AC
  Filled 2015-10-03: qty 1

## 2015-10-03 MED ORDER — ONDANSETRON HCL 8 MG PO TABS
8.0000 mg | ORAL_TABLET | Freq: Once | ORAL | Status: AC
  Administered 2015-10-03: 8 mg via ORAL

## 2015-10-03 MED ORDER — AZACITIDINE CHEMO SQ INJECTION
75.0000 mg/m2 | Freq: Once | INTRAMUSCULAR | Status: AC
  Administered 2015-10-03: 140 mg via SUBCUTANEOUS
  Filled 2015-10-03: qty 5.6

## 2015-10-03 NOTE — Patient Instructions (Signed)
Azacitidine suspension for injection (subcutaneous use) What is this medicine? AZACITIDINE (ay za SITE i deen) is a chemotherapy drug. This medicine reduces the growth of cancer cells and can suppress the immune system. It is used for treating myelodysplastic syndrome or some types of leukemia. This medicine may be used for other purposes; ask your health care provider or pharmacist if you have questions. What should I tell my health care provider before I take this medicine? They need to know if you have any of these conditions: -infection (especially a virus infection such as chickenpox, cold sores, or herpes) -kidney disease -liver disease -liver tumors -an unusual or allergic reaction to azacitidine, mannitol, other medicines, foods, dyes, or preservatives -pregnant or trying to get pregnant -breast-feeding How should I use this medicine? This medicine is for injection under the skin. It is administered in a hospital or clinic by a specially trained health care professional. Talk to your pediatrician regarding the use of this medicine in children. While this drug may be prescribed for selected conditions, precautions do apply. Overdosage: If you think you have taken too much of this medicine contact a poison control center or emergency room at once. NOTE: This medicine is only for you. Do not share this medicine with others. What if I miss a dose? It is important not to miss your dose. Call your doctor or health care professional if you are unable to keep an appointment. What may interact with this medicine? Interactions have not been studied. Give your health care provider a list of all the medicines, herbs, non-prescription drugs, or dietary supplements you use. Also tell them if you smoke, drink alcohol, or use illegal drugs. Some items may interact with your medicine. This list may not describe all possible interactions. Give your health care provider a list of all the medicines,  herbs, non-prescription drugs, or dietary supplements you use. Also tell them if you smoke, drink alcohol, or use illegal drugs. Some items may interact with your medicine. What should I watch for while using this medicine? Visit your doctor for checks on your progress. This drug may make you feel generally unwell. This is not uncommon, as chemotherapy can affect healthy cells as well as cancer cells. Report any side effects. Continue your course of treatment even though you feel ill unless your doctor tells you to stop. In some cases, you may be given additional medicines to help with side effects. Follow all directions for their use. Call your doctor or health care professional for advice if you get a fever, chills or sore throat, or other symptoms of a cold or flu. Do not treat yourself. This drug decreases your body's ability to fight infections. Try to avoid being around people who are sick. This medicine may increase your risk to bruise or bleed. Call your doctor or health care professional if you notice any unusual bleeding. Do not have any vaccinations without your doctor's approval and avoid anyone who has recently had oral polio vaccine. Do not become pregnant while taking this medicine. Women should inform their doctor if they wish to become pregnant or think they might be pregnant. There is a potential for serious side effects to an unborn child. Talk to your health care professional or pharmacist for more information. Do not breast-feed an infant while taking this medicine. If you are a man, you should not father a child while receiving treatment. What side effects may I notice from receiving this medicine? Side effects that you should report   to your doctor or health care professional as soon as possible: -allergic reactions like skin rash, itching or hives, swelling of the face, lips, or tongue -low blood counts - this medicine may decrease the number of white blood cells, red blood cells  and platelets. You may be at increased risk for infections and bleeding. -signs of infection - fever or chills, cough, sore throat, pain or difficulty passing urine -signs of decreased platelets or bleeding - bruising, pinpoint red spots on the skin, black, tarry stools, blood in the urine -signs of decreased red blood cells - unusually weak or tired, fainting spells, lightheadedness -reactions at the injection site including redness, pain, itching, or bruising -breathing problems -changes in vision -fever -mouth sores -stomach pain -vomiting Side effects that usually do not require medical attention (report to your doctor or health care professional if they continue or are bothersome): -constipation -diarrhea -loss of appetite -nausea -pain or redness at the injection site -weak or tired This list may not describe all possible side effects. Call your doctor for medical advice about side effects. You may report side effects to FDA at 1-800-FDA-1088. Where should I keep my medicine? This drug is given in a hospital or clinic and will not be stored at home. NOTE: This sheet is a summary. It may not cover all possible information. If you have questions about this medicine, talk to your doctor, pharmacist, or health care provider.    2016, Elsevier/Gold Standard. (2014-03-04 18:13:53)    Acute Myelogenous Leukemia, Adult Acute myelogenous leukemia (AML) is a rapid growth cancer of the blood and the soft tissue inside your bones (bone marrow). Normally, your bone marrow makes blast cells that develop into important white blood cells called myeloid cells (and several other types of mature blood cells). These mature cells help to fight infection, carry oxygen, and stop bleeding. With AML, the bone marrow makes abnormal, or unformed myeloid blast cells. These abnormal cells develop into leukemia cells and occupy space in the blood where healthy cells need room. The rapidly growing leukemia cells  begin to take over. Leukemia cells do not fight infection or carry out other important jobs in the blood, and symptoms of infection and illness appear. There are several types of AML depending on the stage and characteristics of the leukemia cells. CAUSES  Experts are not clear on what causes the bone marrow to produce abnormal cells that lead to leukemia. For the most part, it does not appear to be genetic, but related to other external factors.  RISK FACTORS Risk factors include:  Age 78 years and older.  Female.  Smoking.  History of chemotherapy or radiation therapy.  Exposure to chemicals.  Other blood disorders.  Genetic disorders, such as Down syndrome. SYMPTOMS   Poor appetite.  Tiring easily.  Weakness.  Shortness of breath.  Repeat infections.  Low-grade fevers.  Bone pain or aches.  Joint pain or aches.  Abdominal pain.  Pale skin.  Bruising.  Nosebleeds and easy bleeding from minor cuts.  Slow healing of cuts.  Spots on the skin.  Swollen glands.  Headache.  Weight loss.  Swollen gums.  Lumps under the skin. DIAGNOSIS  The diagnosis of AML is made by tests such as:  Blood tests to check blood cell counts and the shape of the blood cells.  Sampling parts of bone that make blood cells (bone marrow).  Genetic testing.  Sampling spinal fluid for leukemia cells.  A biopsy of lumps to check for leukemia  cells.  X-ray exams, ultrasonography, or CT scans. TREATMENT  The type of AML diagnosed will guide treatment options. Treatment can last for several months up to 2-3 years. Treatment aims to destroy leukemia cells as well as stop new diseased cells from growing. Treatment may include:  Chemotherapy.  Radiation therapy to kill cancer cells.  Targeted medicines to treat specific chromosomal mutations.  Stem cell transplant to replace diseased bone marrow with healthy donor bone marrow.  Experimental treatments through clinical  trials.  In rare cases, surgery. HOME CARE INSTRUCTIONS  When you are on chemotherapy:  You and and any visitors should wash hands often. Wash hands before meals, after being outside, and after using the toilet.  Keep your teeth and gums clean and well cared for. Use a soft toothbrush.  Talk with your health care provider about the safety of immunizations.  When visiting a health care facility, ask about side entrances or waiting areas where you will not be exposed to infections.  Take medicines only as directed by your health care provider.  Use a good sun block and clothing to prevent sun exposure.  Usually, it is recommended that other family members receive an influenza shot every year. SEEK MEDICAL CARE IF:   You have a cough or cold symptoms.  You have a sore throat.  You have painful urination.  You have frequent diarrhea.  You have frequent vomiting.  You have a skin rash.  You have a fever.  You have chills.  You have been exposed to chickenpox or measles, especially if you have not been immunized or are not immune to these illnesses. SEEK IMMEDIATE MEDICAL CARE IF:   You have trouble breathing.  You have blood in your urine or feces (stools).   This information is not intended to replace advice given to you by your health care provider. Make sure you discuss any questions you have with your health care provider.   Document Released: 03/31/2013 Document Revised: 07/01/2014 Document Reviewed: 03/31/2013 Elsevier Interactive Patient Education [employer].

## 2015-10-04 ENCOUNTER — Telehealth: Payer: Self-pay | Admitting: Nurse Practitioner

## 2015-10-04 ENCOUNTER — Ambulatory Visit (HOSPITAL_BASED_OUTPATIENT_CLINIC_OR_DEPARTMENT_OTHER): Payer: Medicare Other

## 2015-10-04 DIAGNOSIS — C92Z0 Other myeloid leukemia not having achieved remission: Secondary | ICD-10-CM | POA: Diagnosis not present

## 2015-10-04 DIAGNOSIS — D61818 Other pancytopenia: Secondary | ICD-10-CM

## 2015-10-04 DIAGNOSIS — C9200 Acute myeloblastic leukemia, not having achieved remission: Secondary | ICD-10-CM

## 2015-10-04 DIAGNOSIS — Z5111 Encounter for antineoplastic chemotherapy: Secondary | ICD-10-CM

## 2015-10-04 MED ORDER — AZACITIDINE CHEMO SQ INJECTION
75.0000 mg/m2 | Freq: Once | INTRAMUSCULAR | Status: AC
  Administered 2015-10-04: 140 mg via SUBCUTANEOUS
  Filled 2015-10-04: qty 5.6

## 2015-10-04 MED ORDER — ONDANSETRON HCL 8 MG PO TABS
ORAL_TABLET | ORAL | Status: AC
  Filled 2015-10-04: qty 1

## 2015-10-04 MED ORDER — ONDANSETRON HCL 8 MG PO TABS
8.0000 mg | ORAL_TABLET | Freq: Once | ORAL | Status: AC
  Administered 2015-10-04: 8 mg via ORAL

## 2015-10-04 NOTE — Telephone Encounter (Signed)
sch appts per HB 4/10 pof. Pt Mammo sched for 4/25 at 945 am At [facility]. Pt aware of date/time

## 2015-10-04 NOTE — Patient Instructions (Signed)
[facility] Discharge Instructions for Patients Receiving Chemotherapy  Today you received the following chemotherapy agents Vidaza  To help prevent nausea and vomiting after your treatment, we encourage you to take your nausea medication   If you develop nausea and vomiting that is not controlled by your nausea medication, call the clinic.   BELOW ARE SYMPTOMS THAT SHOULD BE REPORTED IMMEDIATELY:  *FEVER GREATER THAN 100.5 F  *CHILLS WITH OR WITHOUT FEVER  NAUSEA AND VOMITING THAT IS NOT CONTROLLED WITH YOUR NAUSEA MEDICATION  *UNUSUAL SHORTNESS OF BREATH  *UNUSUAL BRUISING OR BLEEDING  TENDERNESS IN MOUTH AND THROAT WITH OR WITHOUT PRESENCE OF ULCERS  *URINARY PROBLEMS  *BOWEL PROBLEMS  UNUSUAL RASH Items with * indicate a potential emergency and should be followed up as soon as possible.  Feel free to call the clinic you have any questions or concerns. The clinic phone number is [phone].  Please show the CHEMO ALERT CARD at check-in to the Emergency Department and triage nurse.   

## 2015-10-05 ENCOUNTER — Ambulatory Visit (HOSPITAL_BASED_OUTPATIENT_CLINIC_OR_DEPARTMENT_OTHER): Payer: Medicare Other

## 2015-10-05 VITALS — BP 153/67 | HR 72 | Temp 97.8°F | Resp 18

## 2015-10-05 DIAGNOSIS — C92Z0 Other myeloid leukemia not having achieved remission: Secondary | ICD-10-CM

## 2015-10-05 DIAGNOSIS — Z5111 Encounter for antineoplastic chemotherapy: Secondary | ICD-10-CM | POA: Diagnosis not present

## 2015-10-05 DIAGNOSIS — C9200 Acute myeloblastic leukemia, not having achieved remission: Secondary | ICD-10-CM

## 2015-10-05 DIAGNOSIS — D61818 Other pancytopenia: Secondary | ICD-10-CM

## 2015-10-05 MED ORDER — AZACITIDINE CHEMO SQ INJECTION
75.0000 mg/m2 | Freq: Once | INTRAMUSCULAR | Status: AC
  Administered 2015-10-05: 140 mg via SUBCUTANEOUS
  Filled 2015-10-05: qty 5.6

## 2015-10-05 MED ORDER — ONDANSETRON HCL 8 MG PO TABS
ORAL_TABLET | ORAL | Status: AC
  Filled 2015-10-05: qty 1

## 2015-10-05 MED ORDER — ONDANSETRON HCL 8 MG PO TABS
8.0000 mg | ORAL_TABLET | Freq: Once | ORAL | Status: AC
  Administered 2015-10-05: 8 mg via ORAL

## 2015-10-05 NOTE — Patient Instructions (Signed)
[facility] Discharge Instructions for Patients Receiving Chemotherapy  Today you received the following chemotherapy agents Vidaza  To help prevent nausea and vomiting after your treatment, we encourage you to take your nausea medication   If you develop nausea and vomiting that is not controlled by your nausea medication, call the clinic.   BELOW ARE SYMPTOMS THAT SHOULD BE REPORTED IMMEDIATELY:  *FEVER GREATER THAN 100.5 F  *CHILLS WITH OR WITHOUT FEVER  NAUSEA AND VOMITING THAT IS NOT CONTROLLED WITH YOUR NAUSEA MEDICATION  *UNUSUAL SHORTNESS OF BREATH  *UNUSUAL BRUISING OR BLEEDING  TENDERNESS IN MOUTH AND THROAT WITH OR WITHOUT PRESENCE OF ULCERS  *URINARY PROBLEMS  *BOWEL PROBLEMS  UNUSUAL RASH Items with * indicate a potential emergency and should be followed up as soon as possible.  Feel free to call the clinic you have any questions or concerns. The clinic phone number is [phone].  Please show the CHEMO ALERT CARD at check-in to the Emergency Department and triage nurse.   

## 2015-10-06 ENCOUNTER — Ambulatory Visit (HOSPITAL_BASED_OUTPATIENT_CLINIC_OR_DEPARTMENT_OTHER): Payer: Medicare Other

## 2015-10-06 ENCOUNTER — Other Ambulatory Visit (HOSPITAL_BASED_OUTPATIENT_CLINIC_OR_DEPARTMENT_OTHER): Payer: Medicare Other

## 2015-10-06 VITALS — BP 153/70 | HR 74 | Temp 98.1°F

## 2015-10-06 DIAGNOSIS — D693 Immune thrombocytopenic purpura: Secondary | ICD-10-CM

## 2015-10-06 DIAGNOSIS — D61818 Other pancytopenia: Secondary | ICD-10-CM

## 2015-10-06 DIAGNOSIS — C92Z0 Other myeloid leukemia not having achieved remission: Secondary | ICD-10-CM | POA: Diagnosis not present

## 2015-10-06 DIAGNOSIS — C9200 Acute myeloblastic leukemia, not having achieved remission: Secondary | ICD-10-CM

## 2015-10-06 DIAGNOSIS — Z5111 Encounter for antineoplastic chemotherapy: Secondary | ICD-10-CM | POA: Diagnosis not present

## 2015-10-06 DIAGNOSIS — D696 Thrombocytopenia, unspecified: Secondary | ICD-10-CM

## 2015-10-06 LAB — CBC WITH DIFFERENTIAL/PLATELET
BASO%: 2.2 % — ABNORMAL HIGH (ref 0.0–2.0)
BASOS ABS: 0.1 10*3/uL (ref 0.0–0.1)
EOS ABS: 0.1 10*3/uL (ref 0.0–0.5)
EOS%: 2 % (ref 0.0–7.0)
HEMATOCRIT: 38.2 % (ref 34.8–46.6)
HEMOGLOBIN: 12.6 g/dL (ref 11.6–15.9)
LYMPH#: 1.7 10*3/uL (ref 0.9–3.3)
LYMPH%: 29.1 % (ref 14.0–49.7)
MCH: 31.4 pg (ref 25.1–34.0)
MCHC: 32.9 g/dL (ref 31.5–36.0)
MCV: 95.3 fL (ref 79.5–101.0)
MONO#: 0.3 10*3/uL (ref 0.1–0.9)
MONO%: 5 % (ref 0.0–14.0)
NEUT%: 61.7 % (ref 38.4–76.8)
NEUTROS ABS: 3.7 10*3/uL (ref 1.5–6.5)
Platelets: 383 10*3/uL (ref 145–400)
RBC: 4.01 10*6/uL (ref 3.70–5.45)
RDW: 15.4 % — AB (ref 11.2–14.5)
WBC: 6 10*3/uL (ref 3.9–10.3)

## 2015-10-06 LAB — CHCC SMEAR

## 2015-10-06 MED ORDER — ROMIPLOSTIM 250 MCG ~~LOC~~ SOLR
60.0000 ug | Freq: Once | SUBCUTANEOUS | Status: AC
  Administered 2015-10-06: 60 ug via SUBCUTANEOUS
  Filled 2015-10-06: qty 0.12

## 2015-10-06 MED ORDER — AZACITIDINE CHEMO SQ INJECTION
75.0000 mg/m2 | Freq: Once | INTRAMUSCULAR | Status: AC
  Administered 2015-10-06: 140 mg via SUBCUTANEOUS
  Filled 2015-10-06: qty 5.6

## 2015-10-06 MED ORDER — ONDANSETRON HCL 8 MG PO TABS
8.0000 mg | ORAL_TABLET | Freq: Once | ORAL | Status: AC
  Administered 2015-10-06: 8 mg via ORAL

## 2015-10-06 NOTE — Patient Instructions (Signed)
[facility] Discharge Instructions for Patients Receiving Chemotherapy  Today you received the following chemotherapy agents Vidaza  To help prevent nausea and vomiting after your treatment, we encourage you to take your nausea medication   If you develop nausea and vomiting that is not controlled by your nausea medication, call the clinic.   BELOW ARE SYMPTOMS THAT SHOULD BE REPORTED IMMEDIATELY:  *FEVER GREATER THAN 100.5 F  *CHILLS WITH OR WITHOUT FEVER  NAUSEA AND VOMITING THAT IS NOT CONTROLLED WITH YOUR NAUSEA MEDICATION  *UNUSUAL SHORTNESS OF BREATH  *UNUSUAL BRUISING OR BLEEDING  TENDERNESS IN MOUTH AND THROAT WITH OR WITHOUT PRESENCE OF ULCERS  *URINARY PROBLEMS  *BOWEL PROBLEMS  UNUSUAL RASH Items with * indicate a potential emergency and should be followed up as soon as possible.  Feel free to call the clinic you have any questions or concerns. The clinic phone number is [phone].  Please show the CHEMO ALERT CARD at check-in to the Emergency Department and triage nurse.   

## 2015-10-09 ENCOUNTER — Ambulatory Visit (HOSPITAL_BASED_OUTPATIENT_CLINIC_OR_DEPARTMENT_OTHER): Payer: Medicare Other

## 2015-10-09 VITALS — BP 136/71 | HR 72 | Temp 97.9°F | Resp 16

## 2015-10-09 DIAGNOSIS — C9200 Acute myeloblastic leukemia, not having achieved remission: Secondary | ICD-10-CM | POA: Diagnosis not present

## 2015-10-09 DIAGNOSIS — Z5111 Encounter for antineoplastic chemotherapy: Secondary | ICD-10-CM | POA: Diagnosis not present

## 2015-10-09 DIAGNOSIS — D61818 Other pancytopenia: Secondary | ICD-10-CM

## 2015-10-09 MED ORDER — ONDANSETRON HCL 8 MG PO TABS
ORAL_TABLET | ORAL | Status: AC
  Filled 2015-10-09: qty 1

## 2015-10-09 MED ORDER — ONDANSETRON HCL 8 MG PO TABS
8.0000 mg | ORAL_TABLET | Freq: Once | ORAL | Status: AC
  Administered 2015-10-09: 8 mg via ORAL

## 2015-10-09 MED ORDER — AZACITIDINE CHEMO SQ INJECTION
75.0000 mg/m2 | Freq: Once | INTRAMUSCULAR | Status: AC
  Administered 2015-10-09: 140 mg via SUBCUTANEOUS
  Filled 2015-10-09: qty 5.6

## 2015-10-09 NOTE — Patient Instructions (Signed)
[facility]charge [facility] for [facility] Receiving [facility]otherapy  Today you received the following chemotherapy agents Vidaza  To help prevent nausea and vomiting after your treatment, we encourage you to take your nausea medication   If you develop nausea and vomiting that is not controlled by your nausea medication, call the clinic.   BELOW ARE SYMPTOMS THAT SHOULD BE REPORTED IMMEDIATELY:  *FEVER GREATER THAN 100.5 F  *CHILLS WITH OR WITHOUT FEVER  NAUSEA AND VOMITING THAT IS NOT CONTROLLED WITH YOUR NAUSEA MEDICATION  *UNUSUAL SHORTNESS OF BREATH  *UNUSUAL BRUISING OR BLEEDING  TENDERNESS IN MOUTH AND THROAT WITH OR WITHOUT PRESENCE OF ULCERS  *URINARY PROBLEMS  *BOWEL PROBLEMS  UNUSUAL RASH Items with * indicate a potential emergency and should be followed up as soon as possible.  Feel free to call the clinic you have any questions or concerns. The clinic phone number is [phone].  Please show the CHEMO ALERT CARD at check-in to the Emergency Department and triage nurse.   

## 2015-10-10 ENCOUNTER — Ambulatory Visit (HOSPITAL_BASED_OUTPATIENT_CLINIC_OR_DEPARTMENT_OTHER): Payer: Medicare Other

## 2015-10-10 VITALS — BP 188/88 | HR 79 | Temp 98.0°F | Resp 18

## 2015-10-10 DIAGNOSIS — D61818 Other pancytopenia: Secondary | ICD-10-CM

## 2015-10-10 DIAGNOSIS — C9200 Acute myeloblastic leukemia, not having achieved remission: Secondary | ICD-10-CM

## 2015-10-10 DIAGNOSIS — Z5111 Encounter for antineoplastic chemotherapy: Secondary | ICD-10-CM | POA: Diagnosis not present

## 2015-10-10 MED ORDER — AZACITIDINE CHEMO SQ INJECTION
75.0000 mg/m2 | Freq: Once | INTRAMUSCULAR | Status: AC
  Administered 2015-10-10: 140 mg via SUBCUTANEOUS
  Filled 2015-10-10: qty 5.6

## 2015-10-10 MED ORDER — ONDANSETRON HCL 8 MG PO TABS
ORAL_TABLET | ORAL | Status: AC
Start: 2015-10-10 — End: 2015-10-10
  Filled 2015-10-10: qty 1

## 2015-10-10 MED ORDER — ONDANSETRON HCL 8 MG PO TABS
8.0000 mg | ORAL_TABLET | Freq: Once | ORAL | Status: AC
  Administered 2015-10-10: 8 mg via ORAL

## 2015-10-10 NOTE — Patient Instructions (Signed)
[facility]charge [facility] for [facility] Receiving [facility]otherapy  Today you received the following chemotherapy agents Vidaza  To help prevent nausea and vomiting after your treatment, we encourage you to take your nausea medication   If you develop nausea and vomiting that is not controlled by your nausea medication, call the clinic.   BELOW ARE SYMPTOMS THAT SHOULD BE REPORTED IMMEDIATELY:  *FEVER GREATER THAN 100.5 F  *CHILLS WITH OR WITHOUT FEVER  NAUSEA AND VOMITING THAT IS NOT CONTROLLED WITH YOUR NAUSEA MEDICATION  *UNUSUAL SHORTNESS OF BREATH  *UNUSUAL BRUISING OR BLEEDING  TENDERNESS IN MOUTH AND THROAT WITH OR WITHOUT PRESENCE OF ULCERS  *URINARY PROBLEMS  *BOWEL PROBLEMS  UNUSUAL RASH Items with * indicate a potential emergency and should be followed up as soon as possible.  Feel free to call the clinic you have any questions or concerns. The clinic phone number is [phone].  Please show the CHEMO ALERT CARD at check-in to the Emergency Department and triage nurse.   

## 2015-10-11 ENCOUNTER — Encounter: Payer: Self-pay | Admitting: Skilled Nursing Facility1

## 2015-10-11 NOTE — Progress Notes (Signed)
Subjective:     Patient ID: [name], female   DOB: 10/23/1935, 80 y.o.   MRN: [id number]  HPI   Review of Systems     Objective:   Physical Exam To assist the pt in identifying dietary strategies to gain lost wt.    Assessment:     Pt identified as being malnourished due to lost wt. Pt was contacted via the telephone at [phone]. Pt was unavailable.     Plan:     Dietitian left a message prompting the pt to call Ernestene Kiel CSO, RD,LDN at [phone].

## 2015-10-13 ENCOUNTER — Other Ambulatory Visit (HOSPITAL_BASED_OUTPATIENT_CLINIC_OR_DEPARTMENT_OTHER): Payer: Medicare Other

## 2015-10-13 ENCOUNTER — Ambulatory Visit (HOSPITAL_BASED_OUTPATIENT_CLINIC_OR_DEPARTMENT_OTHER): Payer: Medicare Other

## 2015-10-13 VITALS — BP 120/60 | HR 86 | Temp 98.5°F

## 2015-10-13 DIAGNOSIS — D693 Immune thrombocytopenic purpura: Secondary | ICD-10-CM | POA: Diagnosis not present

## 2015-10-13 DIAGNOSIS — C9200 Acute myeloblastic leukemia, not having achieved remission: Secondary | ICD-10-CM

## 2015-10-13 DIAGNOSIS — C92Z0 Other myeloid leukemia not having achieved remission: Secondary | ICD-10-CM

## 2015-10-13 DIAGNOSIS — D61818 Other pancytopenia: Secondary | ICD-10-CM

## 2015-10-13 DIAGNOSIS — D696 Thrombocytopenia, unspecified: Secondary | ICD-10-CM

## 2015-10-13 LAB — CBC WITH DIFFERENTIAL/PLATELET
BASO%: 0.3 % (ref 0.0–2.0)
Basophils Absolute: 0 10*3/uL (ref 0.0–0.1)
EOS ABS: 0.2 10*3/uL (ref 0.0–0.5)
EOS%: 3 % (ref 0.0–7.0)
HCT: 37 % (ref 34.8–46.6)
HGB: 12.4 g/dL (ref 11.6–15.9)
LYMPH%: 19.6 % (ref 14.0–49.7)
MCH: 32 pg (ref 25.1–34.0)
MCHC: 33.5 g/dL (ref 31.5–36.0)
MCV: 95.4 fL (ref 79.5–101.0)
MONO#: 0.3 10*3/uL (ref 0.1–0.9)
MONO%: 3.7 % (ref 0.0–14.0)
NEUT%: 73.4 % (ref 38.4–76.8)
NEUTROS ABS: 5.1 10*3/uL (ref 1.5–6.5)
PLATELETS: 283 10*3/uL (ref 145–400)
RBC: 3.88 10*6/uL (ref 3.70–5.45)
RDW: 14.4 % (ref 11.2–14.5)
WBC: 7 10*3/uL (ref 3.9–10.3)
lymph#: 1.4 10*3/uL (ref 0.9–3.3)

## 2015-10-13 LAB — CHCC SMEAR

## 2015-10-13 MED ORDER — ROMIPLOSTIM 250 MCG ~~LOC~~ SOLR
60.0000 ug | Freq: Once | SUBCUTANEOUS | Status: AC
  Administered 2015-10-13: 60 ug via SUBCUTANEOUS
  Filled 2015-10-13: qty 0.12

## 2015-10-16 ENCOUNTER — Other Ambulatory Visit: Payer: Self-pay | Admitting: Family

## 2015-10-16 ENCOUNTER — Ambulatory Visit: Payer: Medicare Other

## 2015-10-20 ENCOUNTER — Other Ambulatory Visit: Payer: Medicare Other

## 2015-10-20 ENCOUNTER — Ambulatory Visit: Payer: Medicare Other

## 2015-10-26 ENCOUNTER — Ambulatory Visit: Payer: Medicare Other

## 2015-10-30 ENCOUNTER — Telehealth: Payer: Self-pay | Admitting: Oncology

## 2015-10-30 ENCOUNTER — Ambulatory Visit (HOSPITAL_BASED_OUTPATIENT_CLINIC_OR_DEPARTMENT_OTHER): Payer: Medicare Other | Admitting: Oncology

## 2015-10-30 ENCOUNTER — Ambulatory Visit (HOSPITAL_BASED_OUTPATIENT_CLINIC_OR_DEPARTMENT_OTHER): Payer: Medicare Other

## 2015-10-30 ENCOUNTER — Other Ambulatory Visit (HOSPITAL_BASED_OUTPATIENT_CLINIC_OR_DEPARTMENT_OTHER): Payer: Medicare Other

## 2015-10-30 VITALS — BP 133/63 | HR 72 | Temp 97.8°F | Resp 18 | Ht 68.0 in | Wt 146.6 lb

## 2015-10-30 DIAGNOSIS — Z5111 Encounter for antineoplastic chemotherapy: Secondary | ICD-10-CM | POA: Diagnosis not present

## 2015-10-30 DIAGNOSIS — C92Z0 Other myeloid leukemia not having achieved remission: Secondary | ICD-10-CM | POA: Diagnosis not present

## 2015-10-30 DIAGNOSIS — D693 Immune thrombocytopenic purpura: Secondary | ICD-10-CM | POA: Diagnosis not present

## 2015-10-30 DIAGNOSIS — C9200 Acute myeloblastic leukemia, not having achieved remission: Secondary | ICD-10-CM

## 2015-10-30 DIAGNOSIS — D61818 Other pancytopenia: Secondary | ICD-10-CM

## 2015-10-30 DIAGNOSIS — C9201 Acute myeloblastic leukemia, in remission: Secondary | ICD-10-CM

## 2015-10-30 LAB — CBC WITH DIFFERENTIAL/PLATELET
BASO%: 1.3 % (ref 0.0–2.0)
BASOS ABS: 0.1 10*3/uL (ref 0.0–0.1)
EOS ABS: 0.1 10*3/uL (ref 0.0–0.5)
EOS%: 2.1 % (ref 0.0–7.0)
HEMATOCRIT: 38.5 % (ref 34.8–46.6)
HEMOGLOBIN: 12.8 g/dL (ref 11.6–15.9)
LYMPH#: 1.5 10*3/uL (ref 0.9–3.3)
LYMPH%: 33.7 % (ref 14.0–49.7)
MCH: 30.8 pg (ref 25.1–34.0)
MCHC: 33.2 g/dL (ref 31.5–36.0)
MCV: 92.8 fL (ref 79.5–101.0)
MONO#: 0.2 10*3/uL (ref 0.1–0.9)
MONO%: 5.1 % (ref 0.0–14.0)
NEUT#: 2.6 10*3/uL (ref 1.5–6.5)
NEUT%: 57.8 % (ref 38.4–76.8)
Platelets: 228 10*3/uL (ref 145–400)
RBC: 4.15 10*6/uL (ref 3.70–5.45)
RDW: 15.5 % — AB (ref 11.2–14.5)
WBC: 4.6 10*3/uL (ref 3.9–10.3)

## 2015-10-30 LAB — COMPREHENSIVE METABOLIC PANEL
ALT: 16 U/L (ref 0–55)
AST: 22 U/L (ref 5–34)
Albumin: 3.7 g/dL (ref 3.5–5.0)
Alkaline Phosphatase: 67 U/L (ref 40–150)
Anion Gap: 7 mEq/L (ref 3–11)
BILIRUBIN TOTAL: 0.6 mg/dL (ref 0.20–1.20)
BUN: 12.7 mg/dL (ref 7.0–26.0)
CO2: 31 meq/L — AB (ref 22–29)
Calcium: 9.6 mg/dL (ref 8.4–10.4)
Chloride: 103 mEq/L (ref 98–109)
Creatinine: 1 mg/dL (ref 0.6–1.1)
EGFR: 52 mL/min/{1.73_m2} — AB (ref >90)
GLUCOSE: 82 mg/dL (ref 70–140)
Potassium: 3.3 mEq/L — ABNORMAL LOW (ref 3.5–5.1)
SODIUM: 141 meq/L (ref 136–145)
TOTAL PROTEIN: 6.8 g/dL (ref 6.4–8.3)

## 2015-10-30 LAB — CHCC SMEAR

## 2015-10-30 MED ORDER — AZACITIDINE CHEMO SQ INJECTION
75.0000 mg/m2 | Freq: Once | INTRAMUSCULAR | Status: AC
  Administered 2015-10-30: 140 mg via SUBCUTANEOUS
  Filled 2015-10-30: qty 5.6

## 2015-10-30 MED ORDER — ONDANSETRON HCL 8 MG PO TABS
8.0000 mg | ORAL_TABLET | Freq: Once | ORAL | Status: AC
  Administered 2015-10-30: 8 mg via ORAL

## 2015-10-30 MED ORDER — ONDANSETRON HCL 8 MG PO TABS
ORAL_TABLET | ORAL | Status: AC
  Filled 2015-10-30: qty 1

## 2015-10-30 NOTE — Patient Instructions (Signed)
[facility] Discharge Instructions for Patients Receiving Chemotherapy  Today you received the following chemotherapy agents: Vidaza   To help prevent nausea and vomiting after your treatment, we encourage you to take your nausea medication as directed.    If you develop nausea and vomiting that is not controlled by your nausea medication, call the clinic.   BELOW ARE SYMPTOMS THAT SHOULD BE REPORTED IMMEDIATELY:  *FEVER GREATER THAN 100.5 F  *CHILLS WITH OR WITHOUT FEVER  NAUSEA AND VOMITING THAT IS NOT CONTROLLED WITH YOUR NAUSEA MEDICATION  *UNUSUAL SHORTNESS OF BREATH  *UNUSUAL BRUISING OR BLEEDING  TENDERNESS IN MOUTH AND THROAT WITH OR WITHOUT PRESENCE OF ULCERS  *URINARY PROBLEMS  *BOWEL PROBLEMS  UNUSUAL RASH Items with * indicate a potential emergency and should be followed up as soon as possible.  Feel free to call the clinic you have any questions or concerns. The clinic phone number is [phone].  Please show the CHEMO ALERT CARD at check-in to the Emergency Department and triage nurse.   

## 2015-10-30 NOTE — Progress Notes (Signed)
[name]  [phone] [phone]   ID: [name] DOB: 12/11/1935  MR#: [id number]  WHQ#:[id number]  Patient Care Team: [facility], FNP as PCP - General (Family Medicine) PCP: [name], FNP, [name] GYN: SU:  OTHER MD: [name], [name], [name]  CHIEF COMPLAINT: Acute myeloid leukemia; ITP  CURRENT TREATMENT: Azacitidine, romiplostim  HISTORY OF PRESENT ILLNESS: From the 05/22/2025 consult note:  "The patient was evaluated at her PCP's office 05/22/2015 with a complaint of DOE worsening over several months. Labwork was obtained including a CBC, whioch showed WBC 1.8, platelets 14K, and Hb 5.9 with an MCV of 97.5. DDimer was 1.04. Accordingly the patient was referred to the ED and was admitted 05/23/2015.   Labwork since admission includes a repeat CBC with WBC 1.7, HB 5.4, MCV 100.6 and platelets 10K. Creat was 1.04 with GFR 50. Reticulocytes are not elevated with abs 39.3 (1.7%) and LDH is normal at 188. Other labs are pending.  The patient tells me because of peripheral neuropathy she has been receiving monthly B-12 shots since 2005."  Bone marrow biopsy 05/25/2015 showed acute myeloid leukemia, with 17% blasts by flow cytometry, 24% by aspirate counts, and 20-30% by immunohistochemistry ([id number], and [id number]). The blasts were positive for CD 117 and focally for MPO. There were also myelodysplasia related changes in all 3 cell lines.  The patient's subsequent history is as detailed below.  INTERVAL HISTORY: [name] returns today for follow-up of her myelodysplasia/acute myeloid leukemia. Her husband was not able to accompany her because of his worsening health situation. He has had significant bleeding problems related to his urinary catheter and has been in the emergency room at least twice in the last month.  Today is day 1 cycle 6 of azacitidine which [name] receives every 4 weeks.. She is also  receiving romiplostim weekly depending on her platelet count. She has not required any for the past 2 weeks.  REVIEW OF SYSTEMS: [name] describes herself is mildly fatigued and she has some low back pain which is chronic and makes it a little slow for her to walk. This is not more intense or persistent than before. Occasionally she has a runny nose, but no epistaxis. She keeps a dry cough especially this time of year. She has stress urinary incontinence. She bruises easily. She feels forgetful and anxious, but not depressed. She is very active in her church. A detailed review of systems today was otherwise stable.  PAST MEDICAL HISTORY: Past Medical History  Diagnosis Date  . GERD (gastroesophageal reflux disease)   . Depression   . Hypertension   . Shortness of breath dyspnea     with exertion  . History of blood transfusion 05/23/2015    "Hgb 5.9"  . Anemia   . Arthritis     "hand joints; hips; back" (05/23/2015)  . Chronic lower back pain   . Anxiety     PAST SURGICAL HISTORY: Past Surgical History  Procedure Laterality Date  . Bone spur      R thigh  . Total shoulder arthroplasty  09/13/2011    Procedure: TOTAL SHOULDER ARTHROPLASTY;  Surgeon: Augustin Schooling, MD;  Location: [facility];  Service: Orthopedics;  Laterality: Left;  LEFT SHOULDER REVERSED TOTAL SHOULDER ARTHROPLASTY  . Total knee arthroplasty  04/27/2012    Procedure: TOTAL KNEE ARTHROPLASTY;  Surgeon: Mauri Pole, MD;  Location: [facility];  Service: Orthopedics;  Laterality: Right;  . Cataract extraction w/  intraocular lens  implant, bilateral  ~ 2005  . Tonsillectomy  ~ 1950  . Esophagogastroduodenoscopy (egd) with propofol N/A 11/08/2014    Procedure: ESOPHAGOGASTRODUODENOSCOPY (EGD) WITH PROPOFOL;  Surgeon: Garlan Fair, MD;  Location: [facility];  Service: Endoscopy;  Laterality: N/A;  . Joint replacement    . Dilation and curettage of uterus      FAMILY HISTORY Family History  Problem Relation Age of  Onset  . Heart attack Mother 79    Died age 78  . Heart disease Brother     Atrial fib  Patient's father died age 18 with prostate cancer; mother died atv81 with an MI. One brother, alive at 43; one sister with parkinson's. No blood or cancer problems otherwise in family  GYNECOLOGIC HISTORY:  No LMP recorded. Patient is postmenopausal. Menarche age 12, first live birth age 50, she is GXP3; menopause age 48, no HR  SOCIAL HISTORY:  Grade school Pharmacist, hospital, retired; husband was a Higher education careers adviser. He has severe heart disease and she is his main caretaker. Son [name] works for [employer] and sings in the [employer]; daughter [name] lives in [place], a homemaker; daughter [name] works at the surgical center in [employer] as an Therapist, sports. The patient has 7 gch. She is a Psychologist, forensic    ADVANCED DIRECTIVES: in place; the patient's son [name] is her [place]: Social History  Substance Use Topics  . Smoking status: Never Smoker   . Smokeless tobacco: Never Used  . Alcohol Use: No     Colonoscopy:  PAP:  Bone density:  Lipid panel:  Allergies  Allergen Reactions  . Cozaar [Losartan] Other (See Comments)    Cause pt to lose her taste for foods  . Codeine Itching and Other (See Comments)    Makes feel crazy  PT STATES SHE ALSO CAN NOT TAKE THE SYNTHETIC CODEINES  . Hydrocodone Itching    Tolerates with benadryl  . Oxycodone Itching  . Tetanus Toxoids Swelling and Rash    Current Outpatient Prescriptions  Medication Sig Dispense Refill  . amLODipine (NORVASC) 2.5 MG tablet TAKE ONE TABLET BY MOUTH ONCE DAILY 90 tablet 5  . Cyanocobalamin 1000 MCG/ML KIT Inject 1,000 mcg as directed every 30 (thirty) days. B 12 shot 10 kit 1  . hydrochlorothiazide (HYDRODIURIL) 25 MG tablet Take 1 tablet (25 mg total) by mouth daily. 90 tablet 0  . Melatonin 5 MG TABS Take 2.5 mg by mouth at bedtime.    . Multiple Vitamin (MULTIVITAMIN WITH MINERALS) TABS tablet Take 1 tablet by mouth daily.    .  ondansetron (ZOFRAN) 8 MG tablet Take 1 tablet (8 mg total) by mouth 2 (two) times daily as needed for nausea or vomiting. (Patient not taking: Reported on 09/04/2015) 30 tablet 1  . pantoprazole (PROTONIX) 40 MG tablet Take 40 mg by mouth every morning.    . valACYclovir (VALTREX) 1000 MG tablet Take 1,000 mg by mouth 2 (two) times daily.     . Venlafaxine HCl 150 MG TB24 TAKE ONE TABLET BY MOUTH ONCE DAILY 30 tablet 5  . Vitamin D, Ergocalciferol, (DRISDOL) 50000 units CAPS capsule Take 1 capsule (50,000 Units total) by mouth See admin instructions. Every other week. 30 capsule 1   No current facility-administered medications for this visit.    OBJECTIVE: Older white woman who appears stated age 80 Vitals:   10/30/15 0951  BP: 133/63  Pulse: 72  Temp: 97.8 F (36.6 C)  Resp: 18  Body mass index is 22.3 kg/(m^2).    ECOG FS:1 - Symptomatic but completely ambulatory  Sclerae unicteric, EOMs intact Oropharynx clear, dentition in good repair No cervical or supraclavicular adenopathy Lungs no rales or rhonchi Heart regular rate and rhythm Abd soft, nontender, positive bowel sounds, No splenomegaly MSK no focal spinal tenderness, no upper extremity lymphedema Neuro: nonfocal, well oriented, appropriate affect Breasts: deferred  LAB RESULTS:  CMP     Component Value Date/Time   NA 140 09/29/2015 1022   NA 138 05/25/2015 0332   K 3.5 09/29/2015 1022   K 4.1 05/25/2015 0332   CL 104 05/25/2015 0332   CO2 27 09/29/2015 1022   CO2 28 05/25/2015 0332   GLUCOSE 85 09/29/2015 1022   GLUCOSE 96 05/25/2015 0332   BUN 13.9 09/29/2015 1022   BUN 17 05/25/2015 0332   CREATININE 0.9 09/29/2015 1022   CREATININE 0.93 05/25/2015 0332   CALCIUM 9.5 09/29/2015 1022   CALCIUM 8.8* 05/25/2015 0332   PROT 6.9 09/29/2015 1022   PROT 6.3* 05/24/2015 0415   ALBUMIN 3.3* 09/29/2015 1022   ALBUMIN 3.4* 05/24/2015 0415   AST 22 09/29/2015 1022   AST 15 05/24/2015 0415   ALT 13 09/29/2015  1022   ALT 11* 05/24/2015 0415   ALKPHOS 82 09/29/2015 1022   ALKPHOS 56 05/24/2015 0415   BILITOT 0.43 09/29/2015 1022   BILITOT 1.3* 05/24/2015 0415   GFRNONAA 58* 05/25/2015 0332   GFRAA >60 05/25/2015 0332    INo results found for: SPEP, UPEP  Lab Results  Component Value Date   WBC 4.6 10/30/2015   NEUTROABS 2.6 10/30/2015   HGB 12.8 10/30/2015   HCT 38.5 10/30/2015   MCV 92.8 10/30/2015   PLT 228 10/30/2015      Chemistry      Component Value Date/Time   NA 140 09/29/2015 1022   NA 138 05/25/2015 0332   K 3.5 09/29/2015 1022   K 4.1 05/25/2015 0332   CL 104 05/25/2015 0332   CO2 27 09/29/2015 1022   CO2 28 05/25/2015 0332   BUN 13.9 09/29/2015 1022   BUN 17 05/25/2015 0332   CREATININE 0.9 09/29/2015 1022   CREATININE 0.93 05/25/2015 0332      Component Value Date/Time   CALCIUM 9.5 09/29/2015 1022   CALCIUM 8.8* 05/25/2015 0332   ALKPHOS 82 09/29/2015 1022   ALKPHOS 56 05/24/2015 0415   AST 22 09/29/2015 1022   AST 15 05/24/2015 0415   ALT 13 09/29/2015 1022   ALT 11* 05/24/2015 0415   BILITOT 0.43 09/29/2015 1022   BILITOT 1.3* 05/24/2015 0415       No results found for: LABCA2  No components found for: LABCA125  No results for input(s): INR in the last 168 hours.  Urinalysis    Component Value Date/Time   COLORURINE YELLOW 04/21/2012 0946   APPEARANCEUR CLOUDY* 04/21/2012 0946   LABSPEC 1.019 04/21/2012 0946   PHURINE 7.0 04/21/2012 0946   GLUCOSEU NEGATIVE 04/21/2012 0946   HGBUR NEGATIVE 04/21/2012 0946   BILIRUBINUR NEGATIVE 04/21/2012 0946   KETONESUR NEGATIVE 04/21/2012 0946   PROTEINUR NEGATIVE 04/21/2012 0946   UROBILINOGEN 1.0 04/21/2012 0946   NITRITE NEGATIVE 04/21/2012 0946   LEUKOCYTESUR NEGATIVE 04/21/2012 0946    STUDIES: Mammography at [facility] 2 weeks ago reportedly found a slightly abnormal right axillary lymph node. The breast however was clear. I will review those results, which are not available today. We will  consider repeat in 6 months.  ASSESSMENT:  80 y.o. [place] woman with acute myeloid leukemia diagnosed by bone marrow biopsy 05/25/2015, with the blast count between 17 and 30% depending on the method of determination.   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, with gain of 2 and 19  (b) consider allogenic transplant  (c) all transfusion products to be irradiated  (1) started sQ azacitidine 06/05/2015, receiving 7 doses every 28 day cycle  (2) immune thrombocytopenic purpura  (a) s/p IVIG 07/06/2015 (1g/kg x 2d) with excellent response  (b) romiplostim first dose 07/07/2015, started weekly as of 08/04/2015  PLAN:  [name] continues to do remarkably well. Her counts today are essentially normal.  She is tolerating the azacitabine with no side effects that she is aware of. At this point I think we can stretch it to treat every 6 weeks, which will make it a little bit easier for her overall. Of course we will continue to follow her counts on a weekly basis.  her biggest problem is her husband's declining health. She really sees this as her primary admission in life. It keeps her from worrying about herself.  She is also doing quite well with the romiplostim. She has not required any treatment for ITP in the last 2 weeks.  She will proceed to cycle 6 of azacitidine today. She will return to see me on June 29 for the beginning of cycle 7. She will follow-up with Dr. Nadara Mustard at [facility] in September  [name] call for any problems that may develop before her next visit here. [name], MD   10/30/2015 10:08 AM

## 2015-10-30 NOTE — Telephone Encounter (Signed)
returned call no vm no answer

## 2015-10-30 NOTE — Addendum Note (Signed)
Addended by: [name] on: 10/30/2015 02:22 PM   Modules accepted: Orders

## 2015-10-30 NOTE — Progress Notes (Signed)
We obtain results of the right axillary ultrasound for 20 11/11/2015 at [facility]. This showed a lymph node with uniform cortex thickening in the right axilla. This was felt to be at low suspicion for malignancy but repeat ultrasound in 2 months was suggested. This will be arranged.

## 2015-10-31 ENCOUNTER — Ambulatory Visit (HOSPITAL_BASED_OUTPATIENT_CLINIC_OR_DEPARTMENT_OTHER): Payer: Medicare Other

## 2015-10-31 ENCOUNTER — Telehealth: Payer: Self-pay | Admitting: Oncology

## 2015-10-31 VITALS — BP 157/79 | HR 71 | Temp 98.5°F | Resp 18

## 2015-10-31 DIAGNOSIS — D61818 Other pancytopenia: Secondary | ICD-10-CM

## 2015-10-31 DIAGNOSIS — C9200 Acute myeloblastic leukemia, not having achieved remission: Secondary | ICD-10-CM

## 2015-10-31 DIAGNOSIS — C92Z0 Other myeloid leukemia not having achieved remission: Secondary | ICD-10-CM | POA: Diagnosis not present

## 2015-10-31 DIAGNOSIS — Z5111 Encounter for antineoplastic chemotherapy: Secondary | ICD-10-CM | POA: Diagnosis not present

## 2015-10-31 MED ORDER — ONDANSETRON HCL 8 MG PO TABS
8.0000 mg | ORAL_TABLET | Freq: Once | ORAL | Status: AC
  Administered 2015-10-31: 8 mg via ORAL

## 2015-10-31 MED ORDER — AZACITIDINE CHEMO SQ INJECTION
75.0000 mg/m2 | Freq: Once | INTRAMUSCULAR | Status: AC
  Administered 2015-10-31: 140 mg via SUBCUTANEOUS
  Filled 2015-10-31: qty 5.6

## 2015-10-31 MED ORDER — ONDANSETRON HCL 8 MG PO TABS
ORAL_TABLET | ORAL | Status: AC
  Filled 2015-10-31: qty 1

## 2015-10-31 NOTE — Patient Instructions (Signed)
[facility] Discharge Instructions for Patients Receiving Chemotherapy  Today you received the following chemotherapy agents: Vidaza   To help prevent nausea and vomiting after your treatment, we encourage you to take your nausea medication as directed.    If you develop nausea and vomiting that is not controlled by your nausea medication, call the clinic.   BELOW ARE SYMPTOMS THAT SHOULD BE REPORTED IMMEDIATELY:  *FEVER GREATER THAN 100.5 F  *CHILLS WITH OR WITHOUT FEVER  NAUSEA AND VOMITING THAT IS NOT CONTROLLED WITH YOUR NAUSEA MEDICATION  *UNUSUAL SHORTNESS OF BREATH  *UNUSUAL BRUISING OR BLEEDING  TENDERNESS IN MOUTH AND THROAT WITH OR WITHOUT PRESENCE OF ULCERS  *URINARY PROBLEMS  *BOWEL PROBLEMS  UNUSUAL RASH Items with * indicate a potential emergency and should be followed up as soon as possible.  Feel free to call the clinic you have any questions or concerns. The clinic phone number is [phone].  Please show the CHEMO ALERT CARD at check-in to the Emergency Department and triage nurse.   

## 2015-10-31 NOTE — Telephone Encounter (Signed)
lvm for [facility] appt 5.18 @ 1pm and advised on June appt

## 2015-11-01 ENCOUNTER — Ambulatory Visit (HOSPITAL_BASED_OUTPATIENT_CLINIC_OR_DEPARTMENT_OTHER): Payer: Medicare Other

## 2015-11-01 VITALS — BP 143/68 | HR 66 | Temp 97.8°F | Resp 18

## 2015-11-01 DIAGNOSIS — C92Z0 Other myeloid leukemia not having achieved remission: Secondary | ICD-10-CM

## 2015-11-01 DIAGNOSIS — Z5111 Encounter for antineoplastic chemotherapy: Secondary | ICD-10-CM | POA: Diagnosis not present

## 2015-11-01 DIAGNOSIS — C9200 Acute myeloblastic leukemia, not having achieved remission: Secondary | ICD-10-CM

## 2015-11-01 DIAGNOSIS — D61818 Other pancytopenia: Secondary | ICD-10-CM

## 2015-11-01 MED ORDER — AZACITIDINE CHEMO SQ INJECTION
75.0000 mg/m2 | Freq: Once | INTRAMUSCULAR | Status: AC
  Administered 2015-11-01: 140 mg via SUBCUTANEOUS
  Filled 2015-11-01: qty 5.6

## 2015-11-01 MED ORDER — ONDANSETRON HCL 8 MG PO TABS
ORAL_TABLET | ORAL | Status: AC
  Filled 2015-11-01: qty 1

## 2015-11-01 MED ORDER — ONDANSETRON HCL 8 MG PO TABS
8.0000 mg | ORAL_TABLET | Freq: Once | ORAL | Status: AC
  Administered 2015-11-01: 8 mg via ORAL

## 2015-11-01 NOTE — Patient Instructions (Signed)
[facility] Discharge Instructions for Patients Receiving Chemotherapy  Today you received the following chemotherapy agents Vidaza  To help prevent nausea and vomiting after your treatment, we encourage you to take your nausea medication   If you develop nausea and vomiting that is not controlled by your nausea medication, call the clinic.   BELOW ARE SYMPTOMS THAT SHOULD BE REPORTED IMMEDIATELY:  *FEVER GREATER THAN 100.5 F  *CHILLS WITH OR WITHOUT FEVER  NAUSEA AND VOMITING THAT IS NOT CONTROLLED WITH YOUR NAUSEA MEDICATION  *UNUSUAL SHORTNESS OF BREATH  *UNUSUAL BRUISING OR BLEEDING  TENDERNESS IN MOUTH AND THROAT WITH OR WITHOUT PRESENCE OF ULCERS  *URINARY PROBLEMS  *BOWEL PROBLEMS  UNUSUAL RASH Items with * indicate a potential emergency and should be followed up as soon as possible.  Feel free to call the clinic you have any questions or concerns. The clinic phone number is [phone].  Please show the CHEMO ALERT CARD at check-in to the Emergency Department and triage nurse.   

## 2015-11-02 ENCOUNTER — Ambulatory Visit (HOSPITAL_BASED_OUTPATIENT_CLINIC_OR_DEPARTMENT_OTHER): Payer: Medicare Other

## 2015-11-02 VITALS — BP 154/74 | HR 76 | Temp 97.8°F | Resp 18

## 2015-11-02 DIAGNOSIS — Z5111 Encounter for antineoplastic chemotherapy: Secondary | ICD-10-CM | POA: Diagnosis not present

## 2015-11-02 DIAGNOSIS — C92Z0 Other myeloid leukemia not having achieved remission: Secondary | ICD-10-CM | POA: Diagnosis not present

## 2015-11-02 DIAGNOSIS — D61818 Other pancytopenia: Secondary | ICD-10-CM

## 2015-11-02 DIAGNOSIS — C9200 Acute myeloblastic leukemia, not having achieved remission: Secondary | ICD-10-CM

## 2015-11-02 MED ORDER — ONDANSETRON HCL 8 MG PO TABS
8.0000 mg | ORAL_TABLET | Freq: Once | ORAL | Status: AC
  Administered 2015-11-02: 8 mg via ORAL

## 2015-11-02 MED ORDER — AZACITIDINE CHEMO SQ INJECTION
75.0000 mg/m2 | Freq: Once | INTRAMUSCULAR | Status: AC
  Administered 2015-11-02: 140 mg via SUBCUTANEOUS
  Filled 2015-11-02: qty 5.6

## 2015-11-02 MED ORDER — ONDANSETRON HCL 8 MG PO TABS
ORAL_TABLET | ORAL | Status: AC
  Filled 2015-11-02: qty 1

## 2015-11-02 NOTE — Patient Instructions (Signed)
[facility] Discharge Instructions for Patients Receiving Chemotherapy  Today you received the following chemotherapy agents Vidaza  To help prevent nausea and vomiting after your treatment, we encourage you to take your nausea medication as prescribed.    If you develop nausea and vomiting that is not controlled by your nausea medication, call the clinic.   BELOW ARE SYMPTOMS THAT SHOULD BE REPORTED IMMEDIATELY:  *FEVER GREATER THAN 100.5 F  *CHILLS WITH OR WITHOUT FEVER  NAUSEA AND VOMITING THAT IS NOT CONTROLLED WITH YOUR NAUSEA MEDICATION  *UNUSUAL SHORTNESS OF BREATH  *UNUSUAL BRUISING OR BLEEDING  TENDERNESS IN MOUTH AND THROAT WITH OR WITHOUT PRESENCE OF ULCERS  *URINARY PROBLEMS  *BOWEL PROBLEMS  UNUSUAL RASH Items with * indicate a potential emergency and should be followed up as soon as possible.  Feel free to call the clinic you have any questions or concerns. The clinic phone number is [phone].  Please show the CHEMO ALERT CARD at check-in to the Emergency Department and triage nurse.   

## 2015-11-03 ENCOUNTER — Other Ambulatory Visit: Payer: Self-pay | Admitting: Oncology

## 2015-11-03 ENCOUNTER — Ambulatory Visit (HOSPITAL_BASED_OUTPATIENT_CLINIC_OR_DEPARTMENT_OTHER): Payer: Medicare Other

## 2015-11-03 VITALS — BP 169/72 | HR 70 | Temp 97.7°F | Resp 17

## 2015-11-03 DIAGNOSIS — C92Z0 Other myeloid leukemia not having achieved remission: Secondary | ICD-10-CM | POA: Diagnosis not present

## 2015-11-03 DIAGNOSIS — D61818 Other pancytopenia: Secondary | ICD-10-CM

## 2015-11-03 DIAGNOSIS — Z5111 Encounter for antineoplastic chemotherapy: Secondary | ICD-10-CM | POA: Diagnosis not present

## 2015-11-03 DIAGNOSIS — C9200 Acute myeloblastic leukemia, not having achieved remission: Secondary | ICD-10-CM

## 2015-11-03 MED ORDER — ONDANSETRON HCL 8 MG PO TABS
8.0000 mg | ORAL_TABLET | Freq: Once | ORAL | Status: AC
  Administered 2015-11-03: 8 mg via ORAL

## 2015-11-03 MED ORDER — AZACITIDINE CHEMO SQ INJECTION
75.0000 mg/m2 | Freq: Once | INTRAMUSCULAR | Status: AC
  Administered 2015-11-03: 140 mg via SUBCUTANEOUS
  Filled 2015-11-03: qty 5.6

## 2015-11-03 MED ORDER — ONDANSETRON HCL 8 MG PO TABS
ORAL_TABLET | ORAL | Status: AC
  Filled 2015-11-03: qty 1

## 2015-11-03 NOTE — Patient Instructions (Signed)
Azacitidine suspension for injection (subcutaneous use) What is this medicine? AZACITIDINE (ay za SITE i deen) is a chemotherapy drug. This medicine reduces the growth of cancer cells and can suppress the immune system. It is used for treating myelodysplastic syndrome or some types of leukemia. This medicine may be used for other purposes; ask your health care provider or pharmacist if you have questions. What should I tell my health care provider before I take this medicine? They need to know if you have any of these conditions: -infection (especially a virus infection such as chickenpox, cold sores, or herpes) -kidney disease -liver disease -liver tumors -an unusual or allergic reaction to azacitidine, mannitol, other medicines, foods, dyes, or preservatives -pregnant or trying to get pregnant -breast-feeding How should I use this medicine? This medicine is for injection under the skin. It is administered in a hospital or clinic by a specially trained health care professional. Talk to your pediatrician regarding the use of this medicine in children. While this drug may be prescribed for selected conditions, precautions do apply. Overdosage: If you think you have taken too much of this medicine contact a poison control center or emergency room at once. NOTE: This medicine is only for you. Do not share this medicine with others. What if I miss a dose? It is important not to miss your dose. Call your doctor or health care professional if you are unable to keep an appointment. What may interact with this medicine? Interactions have not been studied. Give your health care provider a list of all the medicines, herbs, non-prescription drugs, or dietary supplements you use. Also tell them if you smoke, drink alcohol, or use illegal drugs. Some items may interact with your medicine. This list may not describe all possible interactions. Give your health care provider a list of all the medicines,  herbs, non-prescription drugs, or dietary supplements you use. Also tell them if you smoke, drink alcohol, or use illegal drugs. Some items may interact with your medicine. What should I watch for while using this medicine? Visit your doctor for checks on your progress. This drug may make you feel generally unwell. This is not uncommon, as chemotherapy can affect healthy cells as well as cancer cells. Report any side effects. Continue your course of treatment even though you feel ill unless your doctor tells you to stop. In some cases, you may be given additional medicines to help with side effects. Follow all directions for their use. Call your doctor or health care professional for advice if you get a fever, chills or sore throat, or other symptoms of a cold or flu. Do not treat yourself. This drug decreases your body's ability to fight infections. Try to avoid being around people who are sick. This medicine may increase your risk to bruise or bleed. Call your doctor or health care professional if you notice any unusual bleeding. Do not have any vaccinations without your doctor's approval and avoid anyone who has recently had oral polio vaccine. Do not become pregnant while taking this medicine. Women should inform their doctor if they wish to become pregnant or think they might be pregnant. There is a potential for serious side effects to an unborn child. Talk to your health care professional or pharmacist for more information. Do not breast-feed an infant while taking this medicine. If you are a man, you should not father a child while receiving treatment. What side effects may I notice from receiving this medicine? Side effects that you should report   to your doctor or health care professional as soon as possible: -allergic reactions like skin rash, itching or hives, swelling of the face, lips, or tongue -low blood counts - this medicine may decrease the number of white blood cells, red blood cells  and platelets. You may be at increased risk for infections and bleeding. -signs of infection - fever or chills, cough, sore throat, pain or difficulty passing urine -signs of decreased platelets or bleeding - bruising, pinpoint red spots on the skin, black, tarry stools, blood in the urine -signs of decreased red blood cells - unusually weak or tired, fainting spells, lightheadedness -reactions at the injection site including redness, pain, itching, or bruising -breathing problems -changes in vision -fever -mouth sores -stomach pain -vomiting Side effects that usually do not require medical attention (report to your doctor or health care professional if they continue or are bothersome): -constipation -diarrhea -loss of appetite -nausea -pain or redness at the injection site -weak or tired This list may not describe all possible side effects. Call your doctor for medical advice about side effects. You may report side effects to FDA at 1-800-FDA-1088. Where should I keep my medicine? This drug is given in a hospital or clinic and will not be stored at home. NOTE: This sheet is a summary. It may not cover all possible information. If you have questions about this medicine, talk to your doctor, pharmacist, or health care provider.    2016, Elsevier/Gold Standard. (2014-03-04 18:13:53)  

## 2015-11-06 ENCOUNTER — Other Ambulatory Visit (HOSPITAL_BASED_OUTPATIENT_CLINIC_OR_DEPARTMENT_OTHER): Payer: Medicare Other

## 2015-11-06 ENCOUNTER — Ambulatory Visit (HOSPITAL_BASED_OUTPATIENT_CLINIC_OR_DEPARTMENT_OTHER): Payer: Medicare Other

## 2015-11-06 ENCOUNTER — Telehealth: Payer: Self-pay

## 2015-11-06 ENCOUNTER — Other Ambulatory Visit: Payer: Self-pay

## 2015-11-06 VITALS — BP 143/65 | HR 75 | Temp 98.1°F | Resp 18

## 2015-11-06 DIAGNOSIS — C9200 Acute myeloblastic leukemia, not having achieved remission: Secondary | ICD-10-CM

## 2015-11-06 DIAGNOSIS — Z5111 Encounter for antineoplastic chemotherapy: Secondary | ICD-10-CM

## 2015-11-06 DIAGNOSIS — D61818 Other pancytopenia: Secondary | ICD-10-CM

## 2015-11-06 DIAGNOSIS — C92Z0 Other myeloid leukemia not having achieved remission: Secondary | ICD-10-CM | POA: Diagnosis not present

## 2015-11-06 LAB — CBC WITH DIFFERENTIAL/PLATELET
BASO%: 1.6 % (ref 0.0–2.0)
Basophils Absolute: 0.1 10*3/uL (ref 0.0–0.1)
EOS%: 2 % (ref 0.0–7.0)
Eosinophils Absolute: 0.1 10*3/uL (ref 0.0–0.5)
HCT: 37.7 % (ref 34.8–46.6)
HGB: 12.4 g/dL (ref 11.6–15.9)
LYMPH%: 28.3 % (ref 14.0–49.7)
MCH: 30.5 pg (ref 25.1–34.0)
MCHC: 32.9 g/dL (ref 31.5–36.0)
MCV: 92.9 fL (ref 79.5–101.0)
MONO#: 0.3 10*3/uL (ref 0.1–0.9)
MONO%: 5.1 % (ref 0.0–14.0)
NEUT%: 63 % (ref 38.4–76.8)
NEUTROS ABS: 3.2 10*3/uL (ref 1.5–6.5)
PLATELETS: 233 10*3/uL (ref 145–400)
RBC: 4.06 10*6/uL (ref 3.70–5.45)
RDW: 14.7 % — ABNORMAL HIGH (ref 11.2–14.5)
WBC: 5.1 10*3/uL (ref 3.9–10.3)
lymph#: 1.4 10*3/uL (ref 0.9–3.3)

## 2015-11-06 LAB — CHCC SMEAR

## 2015-11-06 MED ORDER — AZACITIDINE CHEMO SQ INJECTION
75.0000 mg/m2 | Freq: Once | INTRAMUSCULAR | Status: AC
  Administered 2015-11-06: 140 mg via SUBCUTANEOUS
  Filled 2015-11-06: qty 5.6

## 2015-11-06 MED ORDER — ONDANSETRON HCL 8 MG PO TABS
8.0000 mg | ORAL_TABLET | Freq: Once | ORAL | Status: AC
  Administered 2015-11-06: 8 mg via ORAL

## 2015-11-06 MED ORDER — ONDANSETRON HCL 8 MG PO TABS
ORAL_TABLET | ORAL | Status: AC
  Filled 2015-11-06: qty 1

## 2015-11-06 NOTE — Telephone Encounter (Signed)
lvm added lab to today's appt so come at 0930 for lab, tx at 10

## 2015-11-07 ENCOUNTER — Ambulatory Visit (HOSPITAL_BASED_OUTPATIENT_CLINIC_OR_DEPARTMENT_OTHER): Payer: Medicare Other

## 2015-11-07 ENCOUNTER — Telehealth: Payer: Self-pay | Admitting: Oncology

## 2015-11-07 VITALS — BP 151/70 | HR 72 | Temp 97.7°F | Resp 18

## 2015-11-07 DIAGNOSIS — C92Z0 Other myeloid leukemia not having achieved remission: Secondary | ICD-10-CM

## 2015-11-07 DIAGNOSIS — C9200 Acute myeloblastic leukemia, not having achieved remission: Secondary | ICD-10-CM

## 2015-11-07 DIAGNOSIS — D61818 Other pancytopenia: Secondary | ICD-10-CM

## 2015-11-07 DIAGNOSIS — Z5111 Encounter for antineoplastic chemotherapy: Secondary | ICD-10-CM

## 2015-11-07 MED ORDER — AZACITIDINE CHEMO SQ INJECTION
75.0000 mg/m2 | Freq: Once | INTRAMUSCULAR | Status: AC
  Administered 2015-11-07: 140 mg via SUBCUTANEOUS
  Filled 2015-11-07: qty 5.6

## 2015-11-07 MED ORDER — ONDANSETRON HCL 8 MG PO TABS
ORAL_TABLET | ORAL | Status: AC
  Filled 2015-11-07: qty 1

## 2015-11-07 MED ORDER — ONDANSETRON HCL 8 MG PO TABS
8.0000 mg | ORAL_TABLET | Freq: Once | ORAL | Status: AC
  Administered 2015-11-07: 8 mg via ORAL

## 2015-11-07 NOTE — Telephone Encounter (Signed)
Gave and printed new sched

## 2015-11-07 NOTE — Patient Instructions (Signed)
Azacitidine suspension for injection (subcutaneous use) What is this medicine? AZACITIDINE (ay za SITE i deen) is a chemotherapy drug. This medicine reduces the growth of cancer cells and can suppress the immune system. It is used for treating myelodysplastic syndrome or some types of leukemia. This medicine may be used for other purposes; ask your health care provider or pharmacist if you have questions. What should I tell my health care provider before I take this medicine? They need to know if you have any of these conditions: -infection (especially a virus infection such as chickenpox, cold sores, or herpes) -kidney disease -liver disease -liver tumors -an unusual or allergic reaction to azacitidine, mannitol, other medicines, foods, dyes, or preservatives -pregnant or trying to get pregnant -breast-feeding How should I use this medicine? This medicine is for injection under the skin. It is administered in a hospital or clinic by a specially trained health care professional. Talk to your pediatrician regarding the use of this medicine in children. While this drug may be prescribed for selected conditions, precautions do apply. Overdosage: If you think you have taken too much of this medicine contact a poison control center or emergency room at once. NOTE: This medicine is only for you. Do not share this medicine with others. What if I miss a dose? It is important not to miss your dose. Call your doctor or health care professional if you are unable to keep an appointment. What may interact with this medicine? Interactions have not been studied. Give your health care provider a list of all the medicines, herbs, non-prescription drugs, or dietary supplements you use. Also tell them if you smoke, drink alcohol, or use illegal drugs. Some items may interact with your medicine. This list may not describe all possible interactions. Give your health care provider a list of all the medicines,  herbs, non-prescription drugs, or dietary supplements you use. Also tell them if you smoke, drink alcohol, or use illegal drugs. Some items may interact with your medicine. What should I watch for while using this medicine? Visit your doctor for checks on your progress. This drug may make you feel generally unwell. This is not uncommon, as chemotherapy can affect healthy cells as well as cancer cells. Report any side effects. Continue your course of treatment even though you feel ill unless your doctor tells you to stop. In some cases, you may be given additional medicines to help with side effects. Follow all directions for their use. Call your doctor or health care professional for advice if you get a fever, chills or sore throat, or other symptoms of a cold or flu. Do not treat yourself. This drug decreases your body's ability to fight infections. Try to avoid being around people who are sick. This medicine may increase your risk to bruise or bleed. Call your doctor or health care professional if you notice any unusual bleeding. Do not have any vaccinations without your doctor's approval and avoid anyone who has recently had oral polio vaccine. Do not become pregnant while taking this medicine. Women should inform their doctor if they wish to become pregnant or think they might be pregnant. There is a potential for serious side effects to an unborn child. Talk to your health care professional or pharmacist for more information. Do not breast-feed an infant while taking this medicine. If you are a man, you should not father a child while receiving treatment. What side effects may I notice from receiving this medicine? Side effects that you should report   to your doctor or health care professional as soon as possible: -allergic reactions like skin rash, itching or hives, swelling of the face, lips, or tongue -low blood counts - this medicine may decrease the number of white blood cells, red blood cells  and platelets. You may be at increased risk for infections and bleeding. -signs of infection - fever or chills, cough, sore throat, pain or difficulty passing urine -signs of decreased platelets or bleeding - bruising, pinpoint red spots on the skin, black, tarry stools, blood in the urine -signs of decreased red blood cells - unusually weak or tired, fainting spells, lightheadedness -reactions at the injection site including redness, pain, itching, or bruising -breathing problems -changes in vision -fever -mouth sores -stomach pain -vomiting Side effects that usually do not require medical attention (report to your doctor or health care professional if they continue or are bothersome): -constipation -diarrhea -loss of appetite -nausea -pain or redness at the injection site -weak or tired This list may not describe all possible side effects. Call your doctor for medical advice about side effects. You may report side effects to FDA at 1-800-FDA-1088. Where should I keep my medicine? This drug is given in a hospital or clinic and will not be stored at home. NOTE: This sheet is a summary. It may not cover all possible information. If you have questions about this medicine, talk to your doctor, pharmacist, or health care provider.    2016, Elsevier/Gold Standard. (2014-03-04 18:13:53)  

## 2015-11-09 LAB — HM MAMMOGRAPHY

## 2015-11-10 ENCOUNTER — Other Ambulatory Visit (HOSPITAL_BASED_OUTPATIENT_CLINIC_OR_DEPARTMENT_OTHER): Payer: Medicare Other

## 2015-11-10 ENCOUNTER — Other Ambulatory Visit: Payer: Self-pay | Admitting: *Deleted

## 2015-11-10 ENCOUNTER — Ambulatory Visit (HOSPITAL_BASED_OUTPATIENT_CLINIC_OR_DEPARTMENT_OTHER): Payer: Medicare Other

## 2015-11-10 VITALS — BP 133/66 | HR 76 | Temp 98.4°F | Resp 18

## 2015-11-10 DIAGNOSIS — D693 Immune thrombocytopenic purpura: Secondary | ICD-10-CM

## 2015-11-10 DIAGNOSIS — D61818 Other pancytopenia: Secondary | ICD-10-CM

## 2015-11-10 DIAGNOSIS — C9200 Acute myeloblastic leukemia, not having achieved remission: Secondary | ICD-10-CM

## 2015-11-10 DIAGNOSIS — D696 Thrombocytopenia, unspecified: Secondary | ICD-10-CM

## 2015-11-10 LAB — CBC WITH DIFFERENTIAL/PLATELET
BASO%: 0.5 % (ref 0.0–2.0)
Basophils Absolute: 0 10*3/uL (ref 0.0–0.1)
EOS%: 2 % (ref 0.0–7.0)
Eosinophils Absolute: 0.1 10*3/uL (ref 0.0–0.5)
HCT: 39 % (ref 34.8–46.6)
HGB: 13.1 g/dL (ref 11.6–15.9)
LYMPH%: 21.1 % (ref 14.0–49.7)
MCH: 31.3 pg (ref 25.1–34.0)
MCHC: 33.6 g/dL (ref 31.5–36.0)
MCV: 93.3 fL (ref 79.5–101.0)
MONO#: 0.4 10*3/uL (ref 0.1–0.9)
MONO%: 5.6 % (ref 0.0–14.0)
NEUT#: 4.8 10*3/uL (ref 1.5–6.5)
NEUT%: 70.8 % (ref 38.4–76.8)
Platelets: 183 10*3/uL (ref 145–400)
RBC: 4.18 10*6/uL (ref 3.70–5.45)
RDW: 15.5 % — ABNORMAL HIGH (ref 11.2–14.5)
WBC: 6.8 10*3/uL (ref 3.9–10.3)
lymph#: 1.4 10*3/uL (ref 0.9–3.3)

## 2015-11-10 MED ORDER — ROMIPLOSTIM 250 MCG ~~LOC~~ SOLR
60.0000 ug | Freq: Once | SUBCUTANEOUS | Status: AC
  Administered 2015-11-10: 60 ug via SUBCUTANEOUS
  Filled 2015-11-10: qty 0.12

## 2015-11-13 ENCOUNTER — Other Ambulatory Visit: Payer: Self-pay | Admitting: Radiology

## 2015-11-13 ENCOUNTER — Ambulatory Visit: Payer: Medicare Other | Admitting: Oncology

## 2015-11-13 ENCOUNTER — Other Ambulatory Visit: Payer: Medicare Other

## 2015-11-17 ENCOUNTER — Telehealth: Payer: Self-pay | Admitting: Oncology

## 2015-11-17 ENCOUNTER — Encounter: Payer: Self-pay | Admitting: Family

## 2015-11-17 ENCOUNTER — Other Ambulatory Visit (HOSPITAL_BASED_OUTPATIENT_CLINIC_OR_DEPARTMENT_OTHER): Payer: Medicare Other

## 2015-11-17 ENCOUNTER — Ambulatory Visit (HOSPITAL_BASED_OUTPATIENT_CLINIC_OR_DEPARTMENT_OTHER): Payer: Medicare Other

## 2015-11-17 ENCOUNTER — Telehealth: Payer: Self-pay | Admitting: *Deleted

## 2015-11-17 VITALS — BP 152/78 | HR 80 | Temp 97.8°F | Resp 16

## 2015-11-17 DIAGNOSIS — C92Z0 Other myeloid leukemia not having achieved remission: Secondary | ICD-10-CM | POA: Diagnosis not present

## 2015-11-17 DIAGNOSIS — D696 Thrombocytopenia, unspecified: Secondary | ICD-10-CM

## 2015-11-17 DIAGNOSIS — C9200 Acute myeloblastic leukemia, not having achieved remission: Secondary | ICD-10-CM

## 2015-11-17 DIAGNOSIS — D693 Immune thrombocytopenic purpura: Secondary | ICD-10-CM | POA: Diagnosis not present

## 2015-11-17 DIAGNOSIS — D61818 Other pancytopenia: Secondary | ICD-10-CM

## 2015-11-17 LAB — CHCC SMEAR

## 2015-11-17 LAB — CBC WITH DIFFERENTIAL/PLATELET
BASO%: 0.2 % (ref 0.0–2.0)
BASOS ABS: 0 10*3/uL (ref 0.0–0.1)
EOS ABS: 0.2 10*3/uL (ref 0.0–0.5)
EOS%: 3 % (ref 0.0–7.0)
HCT: 37.7 % (ref 34.8–46.6)
HGB: 12.8 g/dL (ref 11.6–15.9)
LYMPH%: 29.8 % (ref 14.0–49.7)
MCH: 31 pg (ref 25.1–34.0)
MCHC: 34 g/dL (ref 31.5–36.0)
MCV: 91.3 fL (ref 79.5–101.0)
MONO#: 0.3 10*3/uL (ref 0.1–0.9)
MONO%: 6.7 % (ref 0.0–14.0)
NEUT#: 3 10*3/uL (ref 1.5–6.5)
NEUT%: 60.3 % (ref 38.4–76.8)
Platelets: 154 10*3/uL (ref 145–400)
RBC: 4.13 10*6/uL (ref 3.70–5.45)
RDW: 15.2 % — AB (ref 11.2–14.5)
WBC: 4.9 10*3/uL (ref 3.9–10.3)
lymph#: 1.5 10*3/uL (ref 0.9–3.3)

## 2015-11-17 MED ORDER — ROMIPLOSTIM 250 MCG ~~LOC~~ SOLR
60.0000 ug | Freq: Once | SUBCUTANEOUS | Status: AC
  Administered 2015-11-17: 60 ug via SUBCUTANEOUS
  Filled 2015-11-17: qty 0.12

## 2015-11-17 NOTE — Telephone Encounter (Signed)
per pfo to sch pt appt-gave pt copy of avs-MW sch trmt

## 2015-11-17 NOTE — Patient Instructions (Signed)
Romiplostim injection What is this medicine? ROMIPLOSTIM (roe mi PLOE stim) helps your body make more platelets. This medicine is used to treat low platelets caused by chronic idiopathic thrombocytopenic purpura (ITP). This medicine may be used for other purposes; ask your health care provider or pharmacist if you have questions. What should I tell my health care provider before I take this medicine? They need to know if you have any of these conditions: -cancer or myelodysplastic syndrome -low blood counts, like low white cell, platelet, or red cell counts -take medicines that treat or prevent blood clots -an unusual or allergic reaction to romiplostim, mannitol, other medicines, foods, dyes, or preservatives -pregnant or trying to get pregnant -breast-feeding How should I use this medicine? This medicine is for injection under the skin. It is given by a health care professional in a hospital or clinic setting. A special MedGuide will be given to you before your injection. Read this information carefully each time. Talk to your pediatrician regarding the use of this medicine in children. Special care may be needed. Overdosage: If you think you have taken too much of this medicine contact a poison control center or emergency room at once. NOTE: This medicine is only for you. Do not share this medicine with others. What if I miss a dose? It is important not to miss your dose. Call your doctor or health care professional if you are unable to keep an appointment. What may interact with this medicine? Interactions are not expected. This list may not describe all possible interactions. Give your health care provider a list of all the medicines, herbs, non-prescription drugs, or dietary supplements you use. Also tell them if you smoke, drink alcohol, or use illegal drugs. Some items may interact with your medicine. What should I watch for while using this medicine? Your condition will be monitored  carefully while you are receiving this medicine. Visit your prescriber or health care professional for regular checks on your progress and for the needed blood tests. It is important to keep all appointments. What side effects may I notice from receiving this medicine? Side effects that you should report to your doctor or health care professional as soon as possible: -allergic reactions like skin rash, itching or hives, swelling of the face, lips, or tongue -shortness of breath, chest pain, swelling in a leg -unusual bleeding or bruising Side effects that usually do not require medical attention (report to your doctor or health care professional if they continue or are bothersome): -dizziness -headache -muscle aches -pain in arms and legs -stomach pain -trouble sleeping This list may not describe all possible side effects. Call your doctor for medical advice about side effects. You may report side effects to FDA at 1-800-FDA-1088. Where should I keep my medicine? This drug is given in a hospital or clinic and will not be stored at home. NOTE: This sheet is a summary. It may not cover all possible information. If you have questions about this medicine, talk to your doctor, pharmacist, or health care provider.    2016, Elsevier/Gold Standard. (2008-02-08 15:13:04)  

## 2015-11-17 NOTE — Telephone Encounter (Signed)
Per staff phone call and POF I have schedueld appts. Scheduler advised of appts.  JMW  

## 2015-11-21 ENCOUNTER — Other Ambulatory Visit: Payer: Self-pay | Admitting: Family

## 2015-11-24 ENCOUNTER — Ambulatory Visit (HOSPITAL_BASED_OUTPATIENT_CLINIC_OR_DEPARTMENT_OTHER): Payer: Medicare Other

## 2015-11-24 ENCOUNTER — Other Ambulatory Visit (HOSPITAL_BASED_OUTPATIENT_CLINIC_OR_DEPARTMENT_OTHER): Payer: Medicare Other

## 2015-11-24 VITALS — BP 140/74 | HR 74 | Temp 97.9°F | Resp 20

## 2015-11-24 DIAGNOSIS — C92Z0 Other myeloid leukemia not having achieved remission: Secondary | ICD-10-CM

## 2015-11-24 DIAGNOSIS — D61818 Other pancytopenia: Secondary | ICD-10-CM

## 2015-11-24 DIAGNOSIS — C9200 Acute myeloblastic leukemia, not having achieved remission: Secondary | ICD-10-CM

## 2015-11-24 DIAGNOSIS — D693 Immune thrombocytopenic purpura: Secondary | ICD-10-CM | POA: Diagnosis not present

## 2015-11-24 DIAGNOSIS — D696 Thrombocytopenia, unspecified: Secondary | ICD-10-CM

## 2015-11-24 LAB — CBC WITH DIFFERENTIAL/PLATELET
BASO%: 0.8 % (ref 0.0–2.0)
BASOS ABS: 0 10*3/uL (ref 0.0–0.1)
EOS%: 1.1 % (ref 0.0–7.0)
Eosinophils Absolute: 0 10*3/uL (ref 0.0–0.5)
HCT: 38.2 % (ref 34.8–46.6)
HGB: 12.6 g/dL (ref 11.6–15.9)
LYMPH%: 34.5 % (ref 14.0–49.7)
MCH: 30.8 pg (ref 25.1–34.0)
MCHC: 33 g/dL (ref 31.5–36.0)
MCV: 93.1 fL (ref 79.5–101.0)
MONO#: 0.2 10*3/uL (ref 0.1–0.9)
MONO%: 4 % (ref 0.0–14.0)
NEUT#: 2.5 10*3/uL (ref 1.5–6.5)
NEUT%: 59.6 % (ref 38.4–76.8)
Platelets: 280 10*3/uL (ref 145–400)
RBC: 4.11 10*6/uL (ref 3.70–5.45)
RDW: 16.5 % — ABNORMAL HIGH (ref 11.2–14.5)
WBC: 4.1 10*3/uL (ref 3.9–10.3)
lymph#: 1.4 10*3/uL (ref 0.9–3.3)

## 2015-11-24 LAB — CHCC SMEAR

## 2015-11-24 MED ORDER — ROMIPLOSTIM 250 MCG ~~LOC~~ SOLR
60.0000 ug | Freq: Once | SUBCUTANEOUS | Status: AC
  Administered 2015-11-24: 60 ug via SUBCUTANEOUS
  Filled 2015-11-24: qty 0.12

## 2015-11-24 NOTE — Patient Instructions (Signed)
Romiplostim injection What is this medicine? ROMIPLOSTIM (roe mi PLOE stim) helps your body make more platelets. This medicine is used to treat low platelets caused by chronic idiopathic thrombocytopenic purpura (ITP). This medicine may be used for other purposes; ask your health care provider or pharmacist if you have questions. What should I tell my health care provider before I take this medicine? They need to know if you have any of these conditions: -cancer or myelodysplastic syndrome -low blood counts, like low white cell, platelet, or red cell counts -take medicines that treat or prevent blood clots -an unusual or allergic reaction to romiplostim, mannitol, other medicines, foods, dyes, or preservatives -pregnant or trying to get pregnant -breast-feeding How should I use this medicine? This medicine is for injection under the skin. It is given by a health care professional in a hospital or clinic setting. A special MedGuide will be given to you before your injection. Read this information carefully each time. Talk to your pediatrician regarding the use of this medicine in children. Special care may be needed. Overdosage: If you think you have taken too much of this medicine contact a poison control center or emergency room at once. NOTE: This medicine is only for you. Do not share this medicine with others. What if I miss a dose? It is important not to miss your dose. Call your doctor or health care professional if you are unable to keep an appointment. What may interact with this medicine? Interactions are not expected. This list may not describe all possible interactions. Give your health care provider a list of all the medicines, herbs, non-prescription drugs, or dietary supplements you use. Also tell them if you smoke, drink alcohol, or use illegal drugs. Some items may interact with your medicine. What should I watch for while using this medicine? Your condition will be monitored  carefully while you are receiving this medicine. Visit your prescriber or health care professional for regular checks on your progress and for the needed blood tests. It is important to keep all appointments. What side effects may I notice from receiving this medicine? Side effects that you should report to your doctor or health care professional as soon as possible: -allergic reactions like skin rash, itching or hives, swelling of the face, lips, or tongue -shortness of breath, chest pain, swelling in a leg -unusual bleeding or bruising Side effects that usually do not require medical attention (report to your doctor or health care professional if they continue or are bothersome): -dizziness -headache -muscle aches -pain in arms and legs -stomach pain -trouble sleeping This list may not describe all possible side effects. Call your doctor for medical advice about side effects. You may report side effects to FDA at 1-800-FDA-1088. Where should I keep my medicine? This drug is given in a hospital or clinic and will not be stored at home. NOTE: This sheet is a summary. It may not cover all possible information. If you have questions about this medicine, talk to your doctor, pharmacist, or health care provider.    2016, Elsevier/Gold Standard. (2008-02-08 15:13:04)  

## 2015-12-01 ENCOUNTER — Ambulatory Visit (HOSPITAL_BASED_OUTPATIENT_CLINIC_OR_DEPARTMENT_OTHER): Payer: Medicare Other

## 2015-12-01 ENCOUNTER — Other Ambulatory Visit (HOSPITAL_BASED_OUTPATIENT_CLINIC_OR_DEPARTMENT_OTHER): Payer: Medicare Other

## 2015-12-01 ENCOUNTER — Encounter: Payer: Self-pay | Admitting: Oncology

## 2015-12-01 VITALS — BP 150/66 | HR 78 | Temp 97.6°F | Resp 20

## 2015-12-01 DIAGNOSIS — D61818 Other pancytopenia: Secondary | ICD-10-CM

## 2015-12-01 DIAGNOSIS — C9200 Acute myeloblastic leukemia, not having achieved remission: Secondary | ICD-10-CM

## 2015-12-01 DIAGNOSIS — D693 Immune thrombocytopenic purpura: Secondary | ICD-10-CM | POA: Diagnosis not present

## 2015-12-01 DIAGNOSIS — D696 Thrombocytopenia, unspecified: Secondary | ICD-10-CM

## 2015-12-01 LAB — COMPREHENSIVE METABOLIC PANEL
ALBUMIN: 3.7 g/dL (ref 3.5–5.0)
ALK PHOS: 65 U/L (ref 40–150)
ALT: 15 U/L (ref 0–55)
ANION GAP: 8 meq/L (ref 3–11)
AST: 21 U/L (ref 5–34)
BUN: 16.3 mg/dL (ref 7.0–26.0)
CALCIUM: 9.4 mg/dL (ref 8.4–10.4)
CHLORIDE: 100 meq/L (ref 98–109)
CO2: 30 mEq/L — ABNORMAL HIGH (ref 22–29)
Creatinine: 1.1 mg/dL (ref 0.6–1.1)
EGFR: 50 mL/min/{1.73_m2} — AB (ref >90)
Glucose: 91 mg/dl (ref 70–140)
POTASSIUM: 3.5 meq/L (ref 3.5–5.1)
Sodium: 138 mEq/L (ref 136–145)
Total Bilirubin: 0.65 mg/dL (ref 0.20–1.20)
Total Protein: 6.8 g/dL (ref 6.4–8.3)

## 2015-12-01 LAB — CBC WITH DIFFERENTIAL/PLATELET
BASO%: 1.4 % (ref 0.0–2.0)
BASOS ABS: 0.1 10*3/uL (ref 0.0–0.1)
EOS ABS: 0.1 10*3/uL (ref 0.0–0.5)
EOS%: 1.6 % (ref 0.0–7.0)
HEMATOCRIT: 36.6 % (ref 34.8–46.6)
HEMOGLOBIN: 12.3 g/dL (ref 11.6–15.9)
LYMPH#: 1.1 10*3/uL (ref 0.9–3.3)
LYMPH%: 29.3 % (ref 14.0–49.7)
MCH: 31 pg (ref 25.1–34.0)
MCHC: 33.6 g/dL (ref 31.5–36.0)
MCV: 92.2 fL (ref 79.5–101.0)
MONO#: 0.3 10*3/uL (ref 0.1–0.9)
MONO%: 7.6 % (ref 0.0–14.0)
NEUT#: 2.2 10*3/uL (ref 1.5–6.5)
NEUT%: 60.1 % (ref 38.4–76.8)
PLATELETS: 372 10*3/uL (ref 145–400)
RBC: 3.97 10*6/uL (ref 3.70–5.45)
RDW: 15.2 % — AB (ref 11.2–14.5)
WBC: 3.7 10*3/uL — ABNORMAL LOW (ref 3.9–10.3)

## 2015-12-01 LAB — CHCC SMEAR

## 2015-12-01 MED ORDER — ROMIPLOSTIM 250 MCG ~~LOC~~ SOLR
60.0000 ug | Freq: Once | SUBCUTANEOUS | Status: AC
  Administered 2015-12-01: 60 ug via SUBCUTANEOUS
  Filled 2015-12-01: qty 0.12

## 2015-12-01 NOTE — Patient Instructions (Signed)
Romiplostim injection What is this medicine? ROMIPLOSTIM (roe mi PLOE stim) helps your body make more platelets. This medicine is used to treat low platelets caused by chronic idiopathic thrombocytopenic purpura (ITP). This medicine may be used for other purposes; ask your health care provider or pharmacist if you have questions. What should I tell my health care provider before I take this medicine? They need to know if you have any of these conditions: -cancer or myelodysplastic syndrome -low blood counts, like low white cell, platelet, or red cell counts -take medicines that treat or prevent blood clots -an unusual or allergic reaction to romiplostim, mannitol, other medicines, foods, dyes, or preservatives -pregnant or trying to get pregnant -breast-feeding How should I use this medicine? This medicine is for injection under the skin. It is given by a health care professional in a hospital or clinic setting. A special MedGuide will be given to you before your injection. Read this information carefully each time. Talk to your pediatrician regarding the use of this medicine in children. Special care may be needed. Overdosage: If you think you have taken too much of this medicine contact a poison control center or emergency room at once. NOTE: This medicine is only for you. Do not share this medicine with others. What if I miss a dose? It is important not to miss your dose. Call your doctor or health care professional if you are unable to keep an appointment. What may interact with this medicine? Interactions are not expected. This list may not describe all possible interactions. Give your health care provider a list of all the medicines, herbs, non-prescription drugs, or dietary supplements you use. Also tell them if you smoke, drink alcohol, or use illegal drugs. Some items may interact with your medicine. What should I watch for while using this medicine? Your condition will be monitored  carefully while you are receiving this medicine. Visit your prescriber or health care professional for regular checks on your progress and for the needed blood tests. It is important to keep all appointments. What side effects may I notice from receiving this medicine? Side effects that you should report to your doctor or health care professional as soon as possible: -allergic reactions like skin rash, itching or hives, swelling of the face, lips, or tongue -shortness of breath, chest pain, swelling in a leg -unusual bleeding or bruising Side effects that usually do not require medical attention (report to your doctor or health care professional if they continue or are bothersome): -dizziness -headache -muscle aches -pain in arms and legs -stomach pain -trouble sleeping This list may not describe all possible side effects. Call your doctor for medical advice about side effects. You may report side effects to FDA at 1-800-FDA-1088. Where should I keep my medicine? This drug is given in a hospital or clinic and will not be stored at home. NOTE: This sheet is a summary. It may not cover all possible information. If you have questions about this medicine, talk to your doctor, pharmacist, or health care provider.    2016, Elsevier/Gold Standard. (2008-02-08 15:13:04)  

## 2015-12-07 ENCOUNTER — Other Ambulatory Visit: Payer: Self-pay | Admitting: Oncology

## 2015-12-07 NOTE — Progress Notes (Unsigned)
Joannes right axillary symptoms were evaluated with a right axillary ultrasound at [facility] on 11/09/2015. This shows an irregular lesion in the right axilla which has not changed as compared to prior and is at low suspicion for malignancy. This is felt to be most likely scar. A repeat ultrasound in 3 months was suggested.

## 2015-12-08 ENCOUNTER — Encounter: Payer: Self-pay | Admitting: Oncology

## 2015-12-08 ENCOUNTER — Other Ambulatory Visit (HOSPITAL_BASED_OUTPATIENT_CLINIC_OR_DEPARTMENT_OTHER): Payer: Medicare Other

## 2015-12-08 ENCOUNTER — Ambulatory Visit (HOSPITAL_BASED_OUTPATIENT_CLINIC_OR_DEPARTMENT_OTHER): Payer: Medicare Other

## 2015-12-08 VITALS — BP 155/78 | HR 88 | Temp 97.7°F | Resp 18

## 2015-12-08 DIAGNOSIS — D696 Thrombocytopenia, unspecified: Secondary | ICD-10-CM | POA: Diagnosis not present

## 2015-12-08 DIAGNOSIS — C9200 Acute myeloblastic leukemia, not having achieved remission: Secondary | ICD-10-CM

## 2015-12-08 DIAGNOSIS — D693 Immune thrombocytopenic purpura: Secondary | ICD-10-CM

## 2015-12-08 DIAGNOSIS — D61818 Other pancytopenia: Secondary | ICD-10-CM

## 2015-12-08 LAB — COMPREHENSIVE METABOLIC PANEL
ALBUMIN: 3.6 g/dL (ref 3.5–5.0)
ALK PHOS: 66 U/L (ref 40–150)
ALT: 15 U/L (ref 0–55)
AST: 20 U/L (ref 5–34)
Anion Gap: 7 mEq/L (ref 3–11)
BILIRUBIN TOTAL: 0.57 mg/dL (ref 0.20–1.20)
BUN: 13 mg/dL (ref 7.0–26.0)
CO2: 29 mEq/L (ref 22–29)
Calcium: 9.6 mg/dL (ref 8.4–10.4)
Chloride: 103 mEq/L (ref 98–109)
Creatinine: 0.9 mg/dL (ref 0.6–1.1)
EGFR: 61 mL/min/{1.73_m2} — ABNORMAL LOW (ref >90)
GLUCOSE: 86 mg/dL (ref 70–140)
Potassium: 3.5 mEq/L (ref 3.5–5.1)
SODIUM: 140 meq/L (ref 136–145)
TOTAL PROTEIN: 6.8 g/dL (ref 6.4–8.3)

## 2015-12-08 LAB — CBC WITH DIFFERENTIAL/PLATELET
BASO%: 0.6 % (ref 0.0–2.0)
BASOS ABS: 0 10*3/uL (ref 0.0–0.1)
EOS%: 1.6 % (ref 0.0–7.0)
Eosinophils Absolute: 0.1 10*3/uL (ref 0.0–0.5)
HEMATOCRIT: 39 % (ref 34.8–46.6)
HGB: 12.8 g/dL (ref 11.6–15.9)
LYMPH#: 1.2 10*3/uL (ref 0.9–3.3)
LYMPH%: 20.5 % (ref 14.0–49.7)
MCH: 30.5 pg (ref 25.1–34.0)
MCHC: 32.9 g/dL (ref 31.5–36.0)
MCV: 92.8 fL (ref 79.5–101.0)
MONO#: 0.5 10*3/uL (ref 0.1–0.9)
MONO%: 9.1 % (ref 0.0–14.0)
NEUT#: 4 10*3/uL (ref 1.5–6.5)
NEUT%: 68.2 % (ref 38.4–76.8)
Platelets: 270 10*3/uL (ref 145–400)
RBC: 4.2 10*6/uL (ref 3.70–5.45)
RDW: 15.9 % — AB (ref 11.2–14.5)
WBC: 5.9 10*3/uL (ref 3.9–10.3)

## 2015-12-08 LAB — CHCC SMEAR

## 2015-12-08 MED ORDER — ROMIPLOSTIM 250 MCG ~~LOC~~ SOLR
60.0000 ug | Freq: Once | SUBCUTANEOUS | Status: AC
  Administered 2015-12-08: 60 ug via SUBCUTANEOUS
  Filled 2015-12-08: qty 0.12

## 2015-12-08 NOTE — Patient Instructions (Signed)
Romiplostim injection What is this medicine? ROMIPLOSTIM (roe mi PLOE stim) helps your body make more platelets. This medicine is used to treat low platelets caused by chronic idiopathic thrombocytopenic purpura (ITP). This medicine may be used for other purposes; ask your health care provider or pharmacist if you have questions. What should I tell my health care provider before I take this medicine? They need to know if you have any of these conditions: -cancer or myelodysplastic syndrome -low blood counts, like low white cell, platelet, or red cell counts -take medicines that treat or prevent blood clots -an unusual or allergic reaction to romiplostim, mannitol, other medicines, foods, dyes, or preservatives -pregnant or trying to get pregnant -breast-feeding How should I use this medicine? This medicine is for injection under the skin. It is given by a health care professional in a hospital or clinic setting. A special MedGuide will be given to you before your injection. Read this information carefully each time. Talk to your pediatrician regarding the use of this medicine in children. Special care may be needed. Overdosage: If you think you have taken too much of this medicine contact a poison control center or emergency room at once. NOTE: This medicine is only for you. Do not share this medicine with others. What if I miss a dose? It is important not to miss your dose. Call your doctor or health care professional if you are unable to keep an appointment. What may interact with this medicine? Interactions are not expected. This list may not describe all possible interactions. Give your health care provider a list of all the medicines, herbs, non-prescription drugs, or dietary supplements you use. Also tell them if you smoke, drink alcohol, or use illegal drugs. Some items may interact with your medicine. What should I watch for while using this medicine? Your condition will be monitored  carefully while you are receiving this medicine. Visit your prescriber or health care professional for regular checks on your progress and for the needed blood tests. It is important to keep all appointments. What side effects may I notice from receiving this medicine? Side effects that you should report to your doctor or health care professional as soon as possible: -allergic reactions like skin rash, itching or hives, swelling of the face, lips, or tongue -shortness of breath, chest pain, swelling in a leg -unusual bleeding or bruising Side effects that usually do not require medical attention (report to your doctor or health care professional if they continue or are bothersome): -dizziness -headache -muscle aches -pain in arms and legs -stomach pain -trouble sleeping This list may not describe all possible side effects. Call your doctor for medical advice about side effects. You may report side effects to FDA at 1-800-FDA-1088. Where should I keep my medicine? This drug is given in a hospital or clinic and will not be stored at home. NOTE: This sheet is a summary. It may not cover all possible information. If you have questions about this medicine, talk to your doctor, pharmacist, or health care provider.    2016, Elsevier/Gold Standard. (2008-02-08 15:13:04)  

## 2015-12-11 ENCOUNTER — Ambulatory Visit (HOSPITAL_BASED_OUTPATIENT_CLINIC_OR_DEPARTMENT_OTHER): Payer: Medicare Other

## 2015-12-11 VITALS — BP 155/71 | HR 79 | Temp 98.0°F

## 2015-12-11 DIAGNOSIS — C9200 Acute myeloblastic leukemia, not having achieved remission: Secondary | ICD-10-CM

## 2015-12-11 DIAGNOSIS — C92Z0 Other myeloid leukemia not having achieved remission: Secondary | ICD-10-CM | POA: Diagnosis not present

## 2015-12-11 DIAGNOSIS — Z5111 Encounter for antineoplastic chemotherapy: Secondary | ICD-10-CM

## 2015-12-11 DIAGNOSIS — D61818 Other pancytopenia: Secondary | ICD-10-CM

## 2015-12-11 MED ORDER — AZACITIDINE CHEMO SQ INJECTION
75.0000 mg/m2 | Freq: Once | INTRAMUSCULAR | Status: AC
  Administered 2015-12-11: 140 mg via SUBCUTANEOUS
  Filled 2015-12-11: qty 5.6

## 2015-12-11 MED ORDER — ONDANSETRON HCL 8 MG PO TABS
ORAL_TABLET | ORAL | Status: AC
  Filled 2015-12-11: qty 1

## 2015-12-11 MED ORDER — ONDANSETRON HCL 8 MG PO TABS
8.0000 mg | ORAL_TABLET | Freq: Once | ORAL | Status: AC
  Administered 2015-12-11: 8 mg via ORAL

## 2015-12-11 NOTE — Patient Instructions (Signed)
[facility] Discharge Instructions for Patients Receiving Chemotherapy  Today you received the following chemotherapy agents Vidaza  To help prevent nausea and vomiting after your treatment, we encourage you to take your nausea medication as prescribed.    If you develop nausea and vomiting that is not controlled by your nausea medication, call the clinic.   BELOW ARE SYMPTOMS THAT SHOULD BE REPORTED IMMEDIATELY:  *FEVER GREATER THAN 100.5 F  *CHILLS WITH OR WITHOUT FEVER  NAUSEA AND VOMITING THAT IS NOT CONTROLLED WITH YOUR NAUSEA MEDICATION  *UNUSUAL SHORTNESS OF BREATH  *UNUSUAL BRUISING OR BLEEDING  TENDERNESS IN MOUTH AND THROAT WITH OR WITHOUT PRESENCE OF ULCERS  *URINARY PROBLEMS  *BOWEL PROBLEMS  UNUSUAL RASH Items with * indicate a potential emergency and should be followed up as soon as possible.  Feel free to call the clinic you have any questions or concerns. The clinic phone number is [phone].  Please show the CHEMO ALERT CARD at check-in to the Emergency Department and triage nurse.   

## 2015-12-12 ENCOUNTER — Ambulatory Visit (HOSPITAL_BASED_OUTPATIENT_CLINIC_OR_DEPARTMENT_OTHER): Payer: Medicare Other

## 2015-12-12 VITALS — BP 151/82 | HR 74 | Temp 98.6°F | Resp 18

## 2015-12-12 DIAGNOSIS — C92Z0 Other myeloid leukemia not having achieved remission: Secondary | ICD-10-CM | POA: Diagnosis not present

## 2015-12-12 DIAGNOSIS — Z5111 Encounter for antineoplastic chemotherapy: Secondary | ICD-10-CM | POA: Diagnosis not present

## 2015-12-12 DIAGNOSIS — C9200 Acute myeloblastic leukemia, not having achieved remission: Secondary | ICD-10-CM

## 2015-12-12 DIAGNOSIS — D61818 Other pancytopenia: Secondary | ICD-10-CM

## 2015-12-12 MED ORDER — AZACITIDINE CHEMO SQ INJECTION
75.0000 mg/m2 | Freq: Once | INTRAMUSCULAR | Status: AC
  Administered 2015-12-12: 140 mg via SUBCUTANEOUS
  Filled 2015-12-12: qty 5.6

## 2015-12-12 MED ORDER — ONDANSETRON HCL 8 MG PO TABS
ORAL_TABLET | ORAL | Status: AC
  Filled 2015-12-12: qty 1

## 2015-12-12 MED ORDER — ONDANSETRON HCL 8 MG PO TABS
8.0000 mg | ORAL_TABLET | Freq: Once | ORAL | Status: AC
  Administered 2015-12-12: 8 mg via ORAL

## 2015-12-12 NOTE — Patient Instructions (Signed)
[facility] Discharge Instructions for Patients Receiving Chemotherapy  Today you received the following chemotherapy agents: Vidaza   To help prevent nausea and vomiting after your treatment, we encourage you to take your nausea medication as directed.    If you develop nausea and vomiting that is not controlled by your nausea medication, call the clinic.   BELOW ARE SYMPTOMS THAT SHOULD BE REPORTED IMMEDIATELY:  *FEVER GREATER THAN 100.5 F  *CHILLS WITH OR WITHOUT FEVER  NAUSEA AND VOMITING THAT IS NOT CONTROLLED WITH YOUR NAUSEA MEDICATION  *UNUSUAL SHORTNESS OF BREATH  *UNUSUAL BRUISING OR BLEEDING  TENDERNESS IN MOUTH AND THROAT WITH OR WITHOUT PRESENCE OF ULCERS  *URINARY PROBLEMS  *BOWEL PROBLEMS  UNUSUAL RASH Items with * indicate a potential emergency and should be followed up as soon as possible.  Feel free to call the clinic you have any questions or concerns. The clinic phone number is [phone].  Please show the CHEMO ALERT CARD at check-in to the Emergency Department and triage nurse.   

## 2015-12-13 ENCOUNTER — Ambulatory Visit (HOSPITAL_BASED_OUTPATIENT_CLINIC_OR_DEPARTMENT_OTHER): Payer: Medicare Other

## 2015-12-13 VITALS — BP 158/85 | HR 72 | Temp 98.3°F | Resp 17 | Ht 68.0 in

## 2015-12-13 DIAGNOSIS — C92Z0 Other myeloid leukemia not having achieved remission: Secondary | ICD-10-CM | POA: Diagnosis not present

## 2015-12-13 DIAGNOSIS — Z5111 Encounter for antineoplastic chemotherapy: Secondary | ICD-10-CM

## 2015-12-13 DIAGNOSIS — C9200 Acute myeloblastic leukemia, not having achieved remission: Secondary | ICD-10-CM

## 2015-12-13 DIAGNOSIS — D61818 Other pancytopenia: Secondary | ICD-10-CM

## 2015-12-13 MED ORDER — AZACITIDINE CHEMO SQ INJECTION
75.0000 mg/m2 | Freq: Once | INTRAMUSCULAR | Status: AC
  Administered 2015-12-13: 140 mg via SUBCUTANEOUS
  Filled 2015-12-13: qty 5.6

## 2015-12-13 MED ORDER — ONDANSETRON HCL 8 MG PO TABS
8.0000 mg | ORAL_TABLET | Freq: Once | ORAL | Status: AC
  Administered 2015-12-13: 8 mg via ORAL

## 2015-12-13 MED ORDER — ONDANSETRON HCL 8 MG PO TABS
ORAL_TABLET | ORAL | Status: AC
  Filled 2015-12-13: qty 1

## 2015-12-13 NOTE — Patient Instructions (Signed)
[facility] Discharge Instructions for Patients Receiving Chemotherapy  Today you received the following chemotherapy agents: Vidaza   To help prevent nausea and vomiting after your treatment, we encourage you to take your nausea medication as directed.    If you develop nausea and vomiting that is not controlled by your nausea medication, call the clinic.   BELOW ARE SYMPTOMS THAT SHOULD BE REPORTED IMMEDIATELY:  *FEVER GREATER THAN 100.5 F  *CHILLS WITH OR WITHOUT FEVER  NAUSEA AND VOMITING THAT IS NOT CONTROLLED WITH YOUR NAUSEA MEDICATION  *UNUSUAL SHORTNESS OF BREATH  *UNUSUAL BRUISING OR BLEEDING  TENDERNESS IN MOUTH AND THROAT WITH OR WITHOUT PRESENCE OF ULCERS  *URINARY PROBLEMS  *BOWEL PROBLEMS  UNUSUAL RASH Items with * indicate a potential emergency and should be followed up as soon as possible.  Feel free to call the clinic you have any questions or concerns. The clinic phone number is [phone].  Please show the CHEMO ALERT CARD at check-in to the Emergency Department and triage nurse.   

## 2015-12-14 ENCOUNTER — Encounter: Payer: Self-pay | Admitting: Family

## 2015-12-14 ENCOUNTER — Ambulatory Visit (HOSPITAL_BASED_OUTPATIENT_CLINIC_OR_DEPARTMENT_OTHER): Payer: Medicare Other

## 2015-12-14 VITALS — BP 145/82 | HR 74 | Temp 97.9°F | Resp 18

## 2015-12-14 DIAGNOSIS — D61818 Other pancytopenia: Secondary | ICD-10-CM

## 2015-12-14 DIAGNOSIS — Z5111 Encounter for antineoplastic chemotherapy: Secondary | ICD-10-CM

## 2015-12-14 DIAGNOSIS — C9200 Acute myeloblastic leukemia, not having achieved remission: Secondary | ICD-10-CM

## 2015-12-14 DIAGNOSIS — C92Z0 Other myeloid leukemia not having achieved remission: Secondary | ICD-10-CM

## 2015-12-14 MED ORDER — AZACITIDINE CHEMO SQ INJECTION
75.0000 mg/m2 | Freq: Once | INTRAMUSCULAR | Status: AC
  Administered 2015-12-14: 140 mg via SUBCUTANEOUS
  Filled 2015-12-14: qty 5.6

## 2015-12-14 MED ORDER — ONDANSETRON HCL 8 MG PO TABS
ORAL_TABLET | ORAL | Status: AC
  Filled 2015-12-14: qty 1

## 2015-12-14 MED ORDER — ONDANSETRON HCL 8 MG PO TABS
8.0000 mg | ORAL_TABLET | Freq: Once | ORAL | Status: AC
  Administered 2015-12-14: 8 mg via ORAL

## 2015-12-14 NOTE — Patient Instructions (Signed)
[facility] Discharge Instructions for Patients Receiving Chemotherapy  Today you received the following chemotherapy agents: Vidaza   To help prevent nausea and vomiting after your treatment, we encourage you to take your nausea medication as directed.    If you develop nausea and vomiting that is not controlled by your nausea medication, call the clinic.   BELOW ARE SYMPTOMS THAT SHOULD BE REPORTED IMMEDIATELY:  *FEVER GREATER THAN 100.5 F  *CHILLS WITH OR WITHOUT FEVER  NAUSEA AND VOMITING THAT IS NOT CONTROLLED WITH YOUR NAUSEA MEDICATION  *UNUSUAL SHORTNESS OF BREATH  *UNUSUAL BRUISING OR BLEEDING  TENDERNESS IN MOUTH AND THROAT WITH OR WITHOUT PRESENCE OF ULCERS  *URINARY PROBLEMS  *BOWEL PROBLEMS  UNUSUAL RASH Items with * indicate a potential emergency and should be followed up as soon as possible.  Feel free to call the clinic you have any questions or concerns. The clinic phone number is [phone].  Please show the CHEMO ALERT CARD at check-in to the Emergency Department and triage nurse.   

## 2015-12-15 ENCOUNTER — Ambulatory Visit (HOSPITAL_BASED_OUTPATIENT_CLINIC_OR_DEPARTMENT_OTHER): Payer: Medicare Other

## 2015-12-15 ENCOUNTER — Other Ambulatory Visit (HOSPITAL_BASED_OUTPATIENT_CLINIC_OR_DEPARTMENT_OTHER): Payer: Medicare Other

## 2015-12-15 ENCOUNTER — Ambulatory Visit: Payer: Medicare Other

## 2015-12-15 VITALS — BP 144/64 | HR 69 | Temp 97.9°F | Resp 18

## 2015-12-15 DIAGNOSIS — C92Z0 Other myeloid leukemia not having achieved remission: Secondary | ICD-10-CM

## 2015-12-15 DIAGNOSIS — C9200 Acute myeloblastic leukemia, not having achieved remission: Secondary | ICD-10-CM

## 2015-12-15 DIAGNOSIS — D696 Thrombocytopenia, unspecified: Secondary | ICD-10-CM

## 2015-12-15 DIAGNOSIS — Z5111 Encounter for antineoplastic chemotherapy: Secondary | ICD-10-CM

## 2015-12-15 DIAGNOSIS — D693 Immune thrombocytopenic purpura: Secondary | ICD-10-CM

## 2015-12-15 DIAGNOSIS — D61818 Other pancytopenia: Secondary | ICD-10-CM

## 2015-12-15 LAB — CBC WITH DIFFERENTIAL/PLATELET
BASO%: 0.4 % (ref 0.0–2.0)
Basophils Absolute: 0 10*3/uL (ref 0.0–0.1)
EOS%: 1.2 % (ref 0.0–7.0)
Eosinophils Absolute: 0.1 10*3/uL (ref 0.0–0.5)
HCT: 36.6 % (ref 34.8–46.6)
HGB: 12.4 g/dL (ref 11.6–15.9)
LYMPH%: 18.1 % (ref 14.0–49.7)
MCH: 31.1 pg (ref 25.1–34.0)
MCHC: 33.9 g/dL (ref 31.5–36.0)
MCV: 91.7 fL (ref 79.5–101.0)
MONO#: 0.7 10*3/uL (ref 0.1–0.9)
MONO%: 7.6 % (ref 0.0–14.0)
NEUT%: 72.7 % (ref 38.4–76.8)
NEUTROS ABS: 6.2 10*3/uL (ref 1.5–6.5)
Platelets: 173 10*3/uL (ref 145–400)
RBC: 3.99 10*6/uL (ref 3.70–5.45)
RDW: 14.9 % — ABNORMAL HIGH (ref 11.2–14.5)
WBC: 8.6 10*3/uL (ref 3.9–10.3)
lymph#: 1.6 10*3/uL (ref 0.9–3.3)
nRBC: 0 % (ref 0–0)

## 2015-12-15 LAB — CHCC SMEAR

## 2015-12-15 MED ORDER — ROMIPLOSTIM 250 MCG ~~LOC~~ SOLR
60.0000 ug | Freq: Once | SUBCUTANEOUS | Status: AC
  Administered 2015-12-15: 60 ug via SUBCUTANEOUS
  Filled 2015-12-15: qty 0.12

## 2015-12-15 MED ORDER — ONDANSETRON HCL 8 MG PO TABS
8.0000 mg | ORAL_TABLET | Freq: Once | ORAL | Status: AC
  Administered 2015-12-15: 8 mg via ORAL

## 2015-12-15 MED ORDER — ONDANSETRON HCL 8 MG PO TABS
ORAL_TABLET | ORAL | Status: AC
  Filled 2015-12-15: qty 1

## 2015-12-15 MED ORDER — AZACITIDINE CHEMO SQ INJECTION
75.0000 mg/m2 | Freq: Once | INTRAMUSCULAR | Status: AC
  Administered 2015-12-15: 140 mg via SUBCUTANEOUS
  Filled 2015-12-15: qty 5.6

## 2015-12-15 NOTE — Progress Notes (Signed)
To be given in Infusion room

## 2015-12-15 NOTE — Progress Notes (Deleted)
To be given in Injection Room

## 2015-12-15 NOTE — Patient Instructions (Signed)
Romiplostim injection What is this medicine? ROMIPLOSTIM (roe mi PLOE stim) helps your body make more platelets. This medicine is used to treat low platelets caused by chronic idiopathic thrombocytopenic purpura (ITP). This medicine may be used for other purposes; ask your health care provider or pharmacist if you have questions. What should I tell my health care provider before I take this medicine? They need to know if you have any of these conditions: -cancer or myelodysplastic syndrome -low blood counts, like low white cell, platelet, or red cell counts -take medicines that treat or prevent blood clots -an unusual or allergic reaction to romiplostim, mannitol, other medicines, foods, dyes, or preservatives -pregnant or trying to get pregnant -breast-feeding How should I use this medicine? This medicine is for injection under the skin. It is given by a health care professional in a hospital or clinic setting. A special MedGuide will be given to you before your injection. Read this information carefully each time. Talk to your pediatrician regarding the use of this medicine in children. Special care may be needed. Overdosage: If you think you have taken too much of this medicine contact a poison control center or emergency room at once. NOTE: This medicine is only for you. Do not share this medicine with others. What if I miss a dose? It is important not to miss your dose. Call your doctor or health care professional if you are unable to keep an appointment. What may interact with this medicine? Interactions are not expected. This list may not describe all possible interactions. Give your health care provider a list of all the medicines, herbs, non-prescription drugs, or dietary supplements you use. Also tell them if you smoke, drink alcohol, or use illegal drugs. Some items may interact with your medicine. What should I watch for while using this medicine? Your condition will be monitored  carefully while you are receiving this medicine. Visit your prescriber or health care professional for regular checks on your progress and for the needed blood tests. It is important to keep all appointments. What side effects may I notice from receiving this medicine? Side effects that you should report to your doctor or health care professional as soon as possible: -allergic reactions like skin rash, itching or hives, swelling of the face, lips, or tongue -shortness of breath, chest pain, swelling in a leg -unusual bleeding or bruising Side effects that usually do not require medical attention (report to your doctor or health care professional if they continue or are bothersome): -dizziness -headache -muscle aches -pain in arms and legs -stomach pain -trouble sleeping This list may not describe all possible side effects. Call your doctor for medical advice about side effects. You may report side effects to FDA at 1-800-FDA-1088. Where should I keep my medicine? This drug is given in a hospital or clinic and will not be stored at home. NOTE: This sheet is a summary. It may not cover all possible information. If you have questions about this medicine, talk to your doctor, pharmacist, or health care provider.    2016, Elsevier/Gold Standard. (2008-02-08 15:13:04) Azacitidine suspension for injection (subcutaneous use) What is this medicine? AZACITIDINE (ay [place]) is a chemotherapy drug. This medicine reduces the growth of cancer cells and can suppress the immune system. It is used for treating myelodysplastic syndrome or some types of leukemia. This medicine may be used for other purposes; ask your health care provider or pharmacist if you have questions. What should I tell my health care  provider before I take this medicine? They need to know if you have any of these conditions: -infection (especially a virus infection such as chickenpox, cold sores, or herpes) -kidney  disease -liver disease -liver tumors -an unusual or allergic reaction to azacitidine, mannitol, other medicines, foods, dyes, or preservatives -pregnant or trying to get pregnant -breast-feeding How should I use this medicine? This medicine is for injection under the skin. It is administered in a hospital or clinic by a specially trained health care professional. Talk to your pediatrician regarding the use of this medicine in children. While this drug may be prescribed for selected conditions, precautions do apply. Overdosage: If you think you have taken too much of this medicine contact a poison control center or emergency room at once. NOTE: This medicine is only for you. Do not share this medicine with others. What if I miss a dose? It is important not to miss your dose. Call your doctor or health care professional if you are unable to keep an appointment. What may interact with this medicine? Interactions have not been studied. Give your health care provider a list of all the medicines, herbs, non-prescription drugs, or dietary supplements you use. Also tell them if you smoke, drink alcohol, or use illegal drugs. Some items may interact with your medicine. This list may not describe all possible interactions. Give your health care provider a list of all the medicines, herbs, non-prescription drugs, or dietary supplements you use. Also tell them if you smoke, drink alcohol, or use illegal drugs. Some items may interact with your medicine. What should I watch for while using this medicine? Visit your doctor for checks on your progress. This drug may make you feel generally unwell. This is not uncommon, as chemotherapy can affect healthy cells as well as cancer cells. Report any side effects. Continue your course of treatment even though you feel ill unless your doctor tells you to stop. In some cases, you may be given additional medicines to help with side effects. Follow all directions for  their use. Call your doctor or health care professional for advice if you get a fever, chills or sore throat, or other symptoms of a cold or flu. Do not treat yourself. This drug decreases your body's ability to fight infections. Try to avoid being around people who are sick. This medicine may increase your risk to bruise or bleed. Call your doctor or health care professional if you notice any unusual bleeding. Do not have any vaccinations without your doctor's approval and avoid anyone who has recently had oral polio vaccine. Do not become pregnant while taking this medicine. Women should inform their doctor if they wish to become pregnant or think they might be pregnant. There is a potential for serious side effects to an unborn child. Talk to your health care professional or pharmacist for more information. Do not breast-feed an infant while taking this medicine. If you are a man, you should not father a child while receiving treatment. What side effects may I notice from receiving this medicine? Side effects that you should report to your doctor or health care professional as soon as possible: -allergic reactions like skin rash, itching or hives, swelling of the face, lips, or tongue -low blood counts - this medicine may decrease the number of white blood cells, red blood cells and platelets. You may be at increased risk for infections and bleeding. -signs of infection - fever or chills, cough, sore throat, pain or difficulty passing urine -signs  of decreased platelets or bleeding - bruising, pinpoint red spots on the skin, black, tarry stools, blood in the urine -signs of decreased red blood cells - unusually weak or tired, fainting spells, lightheadedness -reactions at the injection site including redness, pain, itching, or bruising -breathing problems -changes in vision -fever -mouth sores -stomach pain -vomiting Side effects that usually do not require medical attention (report to your  doctor or health care professional if they continue or are bothersome): -constipation -diarrhea -loss of appetite -nausea -pain or redness at the injection site -weak or tired This list may not describe all possible side effects. Call your doctor for medical advice about side effects. You may report side effects to FDA at 1-800-FDA-1088. Where should I keep my medicine? This drug is given in a hospital or clinic and will not be stored at home. NOTE: This sheet is a summary. It may not cover all possible information. If you have questions about this medicine, talk to your doctor, pharmacist, or health care provider.    2016, Elsevier/Gold Standard. (2014-03-04 18:13:53)

## 2015-12-18 ENCOUNTER — Ambulatory Visit (HOSPITAL_BASED_OUTPATIENT_CLINIC_OR_DEPARTMENT_OTHER): Payer: Medicare Other

## 2015-12-18 VITALS — BP 161/84 | HR 73 | Temp 98.3°F | Resp 17

## 2015-12-18 DIAGNOSIS — Z5111 Encounter for antineoplastic chemotherapy: Secondary | ICD-10-CM

## 2015-12-18 DIAGNOSIS — C92Z0 Other myeloid leukemia not having achieved remission: Secondary | ICD-10-CM | POA: Diagnosis not present

## 2015-12-18 DIAGNOSIS — C9200 Acute myeloblastic leukemia, not having achieved remission: Secondary | ICD-10-CM

## 2015-12-18 DIAGNOSIS — D61818 Other pancytopenia: Secondary | ICD-10-CM

## 2015-12-18 MED ORDER — ONDANSETRON HCL 8 MG PO TABS
ORAL_TABLET | ORAL | Status: AC
  Filled 2015-12-18: qty 1

## 2015-12-18 MED ORDER — AZACITIDINE CHEMO SQ INJECTION
75.0000 mg/m2 | Freq: Once | INTRAMUSCULAR | Status: AC
  Administered 2015-12-18: 140 mg via SUBCUTANEOUS
  Filled 2015-12-18: qty 5.6

## 2015-12-18 MED ORDER — ONDANSETRON HCL 8 MG PO TABS
8.0000 mg | ORAL_TABLET | Freq: Once | ORAL | Status: AC
  Administered 2015-12-18: 8 mg via ORAL

## 2015-12-18 NOTE — Patient Instructions (Signed)
Azacitidine suspension for injection (subcutaneous use) What is this medicine? AZACITIDINE (ay za SITE i deen) is a chemotherapy drug. This medicine reduces the growth of cancer cells and can suppress the immune system. It is used for treating myelodysplastic syndrome or some types of leukemia. This medicine may be used for other purposes; ask your health care provider or pharmacist if you have questions. What should I tell my health care provider before I take this medicine? They need to know if you have any of these conditions: -infection (especially a virus infection such as chickenpox, cold sores, or herpes) -kidney disease -liver disease -liver tumors -an unusual or allergic reaction to azacitidine, mannitol, other medicines, foods, dyes, or preservatives -pregnant or trying to get pregnant -breast-feeding How should I use this medicine? This medicine is for injection under the skin. It is administered in a hospital or clinic by a specially trained health care professional. Talk to your pediatrician regarding the use of this medicine in children. While this drug may be prescribed for selected conditions, precautions do apply. Overdosage: If you think you have taken too much of this medicine contact a poison control center or emergency room at once. NOTE: This medicine is only for you. Do not share this medicine with others. What if I miss a dose? It is important not to miss your dose. Call your doctor or health care professional if you are unable to keep an appointment. What may interact with this medicine? Interactions have not been studied. Give your health care provider a list of all the medicines, herbs, non-prescription drugs, or dietary supplements you use. Also tell them if you smoke, drink alcohol, or use illegal drugs. Some items may interact with your medicine. This list may not describe all possible interactions. Give your health care provider a list of all the medicines,  herbs, non-prescription drugs, or dietary supplements you use. Also tell them if you smoke, drink alcohol, or use illegal drugs. Some items may interact with your medicine. What should I watch for while using this medicine? Visit your doctor for checks on your progress. This drug may make you feel generally unwell. This is not uncommon, as chemotherapy can affect healthy cells as well as cancer cells. Report any side effects. Continue your course of treatment even though you feel ill unless your doctor tells you to stop. In some cases, you may be given additional medicines to help with side effects. Follow all directions for their use. Call your doctor or health care professional for advice if you get a fever, chills or sore throat, or other symptoms of a cold or flu. Do not treat yourself. This drug decreases your body's ability to fight infections. Try to avoid being around people who are sick. This medicine may increase your risk to bruise or bleed. Call your doctor or health care professional if you notice any unusual bleeding. Do not have any vaccinations without your doctor's approval and avoid anyone who has recently had oral polio vaccine. Do not become pregnant while taking this medicine. Women should inform their doctor if they wish to become pregnant or think they might be pregnant. There is a potential for serious side effects to an unborn child. Talk to your health care professional or pharmacist for more information. Do not breast-feed an infant while taking this medicine. If you are a man, you should not father a child while receiving treatment. What side effects may I notice from receiving this medicine? Side effects that you should report   to your doctor or health care professional as soon as possible: -allergic reactions like skin rash, itching or hives, swelling of the face, lips, or tongue -low blood counts - this medicine may decrease the number of white blood cells, red blood cells  and platelets. You may be at increased risk for infections and bleeding. -signs of infection - fever or chills, cough, sore throat, pain or difficulty passing urine -signs of decreased platelets or bleeding - bruising, pinpoint red spots on the skin, black, tarry stools, blood in the urine -signs of decreased red blood cells - unusually weak or tired, fainting spells, lightheadedness -reactions at the injection site including redness, pain, itching, or bruising -breathing problems -changes in vision -fever -mouth sores -stomach pain -vomiting Side effects that usually do not require medical attention (report to your doctor or health care professional if they continue or are bothersome): -constipation -diarrhea -loss of appetite -nausea -pain or redness at the injection site -weak or tired This list may not describe all possible side effects. Call your doctor for medical advice about side effects. You may report side effects to FDA at 1-800-FDA-1088. Where should I keep my medicine? This drug is given in a hospital or clinic and will not be stored at home. NOTE: This sheet is a summary. It may not cover all possible information. If you have questions about this medicine, talk to your doctor, pharmacist, or health care provider.    2016, Elsevier/Gold Standard. (2014-03-04 18:13:53)  

## 2015-12-19 ENCOUNTER — Ambulatory Visit (HOSPITAL_BASED_OUTPATIENT_CLINIC_OR_DEPARTMENT_OTHER): Payer: Medicare Other

## 2015-12-19 ENCOUNTER — Encounter: Payer: Self-pay | Admitting: Oncology

## 2015-12-19 VITALS — BP 137/70 | HR 68 | Temp 97.5°F | Resp 16

## 2015-12-19 DIAGNOSIS — Z5111 Encounter for antineoplastic chemotherapy: Secondary | ICD-10-CM

## 2015-12-19 DIAGNOSIS — C92Z0 Other myeloid leukemia not having achieved remission: Secondary | ICD-10-CM | POA: Diagnosis not present

## 2015-12-19 DIAGNOSIS — C9200 Acute myeloblastic leukemia, not having achieved remission: Secondary | ICD-10-CM

## 2015-12-19 DIAGNOSIS — D61818 Other pancytopenia: Secondary | ICD-10-CM

## 2015-12-19 MED ORDER — ONDANSETRON HCL 8 MG PO TABS
ORAL_TABLET | ORAL | Status: AC
  Filled 2015-12-19: qty 1

## 2015-12-19 MED ORDER — ONDANSETRON HCL 8 MG PO TABS
8.0000 mg | ORAL_TABLET | Freq: Once | ORAL | Status: AC
  Administered 2015-12-19: 8 mg via ORAL

## 2015-12-19 MED ORDER — AZACITIDINE CHEMO SQ INJECTION
75.0000 mg/m2 | Freq: Once | INTRAMUSCULAR | Status: AC
  Administered 2015-12-19: 140 mg via SUBCUTANEOUS
  Filled 2015-12-19: qty 5.6

## 2015-12-19 NOTE — Patient Instructions (Signed)
[facility] Discharge Instructions for Patients Receiving Chemotherapy  Today you received the following chemotherapy agents Vidaza  To help prevent nausea and vomiting after your treatment, we encourage you to take your nausea medication   If you develop nausea and vomiting that is not controlled by your nausea medication, call the clinic.   BELOW ARE SYMPTOMS THAT SHOULD BE REPORTED IMMEDIATELY:  *FEVER GREATER THAN 100.5 F  *CHILLS WITH OR WITHOUT FEVER  NAUSEA AND VOMITING THAT IS NOT CONTROLLED WITH YOUR NAUSEA MEDICATION  *UNUSUAL SHORTNESS OF BREATH  *UNUSUAL BRUISING OR BLEEDING  TENDERNESS IN MOUTH AND THROAT WITH OR WITHOUT PRESENCE OF ULCERS  *URINARY PROBLEMS  *BOWEL PROBLEMS  UNUSUAL RASH Items with * indicate a potential emergency and should be followed up as soon as possible.  Feel free to call the clinic you have any questions or concerns. The clinic phone number is [phone].  Please show the CHEMO ALERT CARD at check-in to the Emergency Department and triage nurse.   

## 2015-12-21 ENCOUNTER — Telehealth: Payer: Self-pay | Admitting: Oncology

## 2015-12-21 ENCOUNTER — Other Ambulatory Visit (HOSPITAL_BASED_OUTPATIENT_CLINIC_OR_DEPARTMENT_OTHER): Payer: Medicare Other

## 2015-12-21 ENCOUNTER — Ambulatory Visit (HOSPITAL_BASED_OUTPATIENT_CLINIC_OR_DEPARTMENT_OTHER): Payer: Medicare Other | Admitting: Oncology

## 2015-12-21 VITALS — BP 138/65 | HR 70 | Temp 97.7°F | Resp 18 | Ht 68.0 in | Wt 147.8 lb

## 2015-12-21 DIAGNOSIS — C92Z0 Other myeloid leukemia not having achieved remission: Secondary | ICD-10-CM

## 2015-12-21 DIAGNOSIS — C9201 Acute myeloblastic leukemia, in remission: Secondary | ICD-10-CM

## 2015-12-21 DIAGNOSIS — D693 Immune thrombocytopenic purpura: Secondary | ICD-10-CM

## 2015-12-21 DIAGNOSIS — C9200 Acute myeloblastic leukemia, not having achieved remission: Secondary | ICD-10-CM

## 2015-12-21 DIAGNOSIS — D696 Thrombocytopenia, unspecified: Secondary | ICD-10-CM

## 2015-12-21 DIAGNOSIS — D61818 Other pancytopenia: Secondary | ICD-10-CM

## 2015-12-21 LAB — CBC WITH DIFFERENTIAL/PLATELET
BASO%: 0.7 % (ref 0.0–2.0)
Basophils Absolute: 0 10*3/uL (ref 0.0–0.1)
EOS%: 2.6 % (ref 0.0–7.0)
Eosinophils Absolute: 0.2 10*3/uL (ref 0.0–0.5)
HCT: 35 % (ref 34.8–46.6)
HEMOGLOBIN: 11.6 g/dL (ref 11.6–15.9)
LYMPH%: 17.7 % (ref 14.0–49.7)
MCH: 30.9 pg (ref 25.1–34.0)
MCHC: 33.1 g/dL (ref 31.5–36.0)
MCV: 93.2 fL (ref 79.5–101.0)
MONO#: 0.4 10*3/uL (ref 0.1–0.9)
MONO%: 5.5 % (ref 0.0–14.0)
NEUT%: 73.5 % (ref 38.4–76.8)
NEUTROS ABS: 4.8 10*3/uL (ref 1.5–6.5)
PLATELETS: 129 10*3/uL — AB (ref 145–400)
RBC: 3.75 10*6/uL (ref 3.70–5.45)
RDW: 15.4 % — AB (ref 11.2–14.5)
WBC: 6.6 10*3/uL (ref 3.9–10.3)
lymph#: 1.2 10*3/uL (ref 0.9–3.3)

## 2015-12-21 LAB — CHCC SMEAR

## 2015-12-21 MED ORDER — ROMIPLOSTIM 250 MCG ~~LOC~~ SOLR
60.0000 ug | Freq: Once | SUBCUTANEOUS | Status: AC
  Administered 2015-12-21: 60 ug via SUBCUTANEOUS
  Filled 2015-12-21: qty 0.12

## 2015-12-21 NOTE — Progress Notes (Signed)
[place]  [phone] [phone]   ID: [name] DOB: [birth year]  MR#: [id number]  MWN#:[id number]  Patient Care Team: Alisha Circle, FNP as PCP - General (Family Medicine) PCP: Alisha Po, FNP, [name] GYN: SU:  OTHER Scott: [name], [name], [name]  CHIEF COMPLAINT: Acute myeloid leukemia; ITP  CURRENT TREATMENT: Azacitidine, romiplostim  HISTORY OF PRESENT ILLNESS: From the 05/22/2025 consult note:  "The patient was evaluated at her PCP's office 05/22/2015 with a complaint of DOE worsening over several months. Labwork was obtained including a CBC, whioch showed WBC 1.8, platelets 14K, and Hb 5.9 with an MCV of 97.5. DDimer was 1.04. Accordingly the patient was referred to the ED and was admitted 05/23/2015.   Labwork since admission includes a repeat CBC with WBC 1.7, HB 5.4, MCV 100.6 and platelets 10K. Creat was 1.04 with GFR 50. Reticulocytes are not elevated with abs 39.3 (1.7%) and LDH is normal at 188. Other labs are pending.  The patient tells me because of peripheral neuropathy she has been receiving monthly B-12 shots since 2005."  Bone marrow biopsy 05/25/2015 showed acute myeloid leukemia, with 17% blasts by flow cytometry, 24% by aspirate counts, and 20-30% by immunohistochemistry ([id number], and 898). The blasts were positive for CD 117 and focally for MPO. There were also myelodysplasia related changes in all 3 cell lines.  The patient's subsequent history is as detailed below.  INTERVAL HISTORY: [name] returns today for follow-up of her myelodysplasia/acute myeloid leukemia. She completed her seventh cycle of treatment on 12/19/2015. She again tolerated it without side effects that she is aware of.   Quite aside from that problem, she was found to have an enlarged right axillary lymph node, with attempted biopsy 11/13/2015 being nondiagnostic ([id number]). She is scheduled for repeat  ultrasonography in August at [place].  She continues to receive romiplostim as per protocol, with labs checked every Friday. She will need a dose today.  Her husband went back home yesterday after 5 weeks in a rehabilitation facility. At this point she is very pleased with his progress. He is a little bit more independent.  REVIEW OF SYSTEMS: [name] complains of loss of appetite and has lost a little weight, but she tells me she gained back 3 pounds this time compared to prior. She sleeps poorly. She is mildly to moderately fatigued. She has aches and pains here and there which are not more intense or persistent than before. She has seasonal allergies and a little dry cough at times. She has chronic stress urinary incontinence. She has a little bit of numbness in her feet. A detailed review of systems today was otherwise stable  PAST MEDICAL HISTORY: Past Medical History  Diagnosis Date  . GERD (gastroesophageal reflux disease)   . Depression   . Hypertension   . Shortness of breath dyspnea     with exertion  . History of blood transfusion 05/23/2015    "Hgb 5.9"  . Anemia   . Arthritis     "hand joints; hips; back" (05/23/2015)  . Chronic lower back pain   . Anxiety     PAST SURGICAL HISTORY: Past Surgical History  Procedure Laterality Date  . Bone spur      R thigh  . Total shoulder arthroplasty  09/13/2011    Procedure: TOTAL SHOULDER ARTHROPLASTY;  Surgeon: Alisha Schooling, Scott;  Location: [facility];  Service: Orthopedics;  Laterality: Left;  LEFT SHOULDER REVERSED TOTAL SHOULDER ARTHROPLASTY  .  Total knee arthroplasty  04/27/2012    Procedure: TOTAL KNEE ARTHROPLASTY;  Surgeon: Alisha Pole, Scott;  Location: [facility];  Service: Orthopedics;  Laterality: Right;  . Cataract extraction w/ intraocular lens  implant, bilateral  ~ 2005  . Tonsillectomy  ~ 1950  . Esophagogastroduodenoscopy (egd) with propofol N/A 11/08/2014    Procedure: ESOPHAGOGASTRODUODENOSCOPY (EGD) WITH PROPOFOL;   Surgeon: Alisha Fair, Scott;  Location: [facility];  Service: Endoscopy;  Laterality: N/A;  . Joint replacement    . Dilation and curettage of uterus      FAMILY HISTORY Family History  Problem Relation Age of Onset  . Heart attack Mother 12    Died age 12  . Heart disease Brother     Atrial fib  Patient's father died age 27 with prostate cancer; mother died atv63 with an MI. One brother, alive at 67; one sister with parkinson's. No blood or cancer problems otherwise in family  GYNECOLOGIC HISTORY:  No LMP recorded. Patient is postmenopausal. Menarche age 16, first live birth age 89, she is GXP3; menopause age 79, no HR  SOCIAL HISTORY:  Grade school Pharmacist, hospital, retired; husband was a Higher education careers adviser. He has severe heart disease and she is his main caretaker. Son [name] works for [employer] and sings in the [employer]; daughter [name] lives in [place], a homemaker; daughter [name] works at the surgical center in [employer] as an Therapist, sports. The patient has 7 gch. She is a Psychologist, forensic    ADVANCED DIRECTIVES: in place; the patient's son [name] is her Collins: Social History  Substance Use Topics  . Smoking status: Never Smoker   . Smokeless tobacco: Never Used  . Alcohol Use: No     Colonoscopy:  PAP:  Bone density:  Lipid panel:  Allergies  Allergen Reactions  . Cozaar [Losartan] Other (See Comments)    Cause pt to lose her taste for foods  . Codeine Itching and Other (See Comments)    Makes feel crazy  PT STATES SHE ALSO CAN NOT TAKE THE SYNTHETIC CODEINES  . Hydrocodone Itching    Tolerates with benadryl  . Oxycodone Itching  . Tetanus Toxoids Swelling and Rash    Current Outpatient Prescriptions  Medication Sig Dispense Refill  . amLODipine (NORVASC) 2.5 MG tablet TAKE ONE TABLET BY MOUTH ONCE DAILY 90 tablet 5  . Cyanocobalamin 1000 MCG/ML KIT Inject 1,000 mcg as directed every 30 (thirty) days. B 12 shot 10 kit 1  . hydrochlorothiazide (HYDRODIURIL) 25 MG  tablet TAKE ONE TABLET BY MOUTH ONCE DAILY 90 tablet 0  . Melatonin 5 MG TABS Take 2.5 mg by mouth at bedtime.    . Multiple Vitamin (MULTIVITAMIN WITH MINERALS) TABS tablet Take 1 tablet by mouth daily.    . pantoprazole (PROTONIX) 40 MG tablet Take 40 mg by mouth every morning.    . valACYclovir (VALTREX) 1000 MG tablet Take 1,000 mg by mouth 2 (two) times daily.     . Venlafaxine HCl 150 MG TB24 TAKE ONE TABLET BY MOUTH ONCE DAILY 30 tablet 5  . Vitamin D, Ergocalciferol, (DRISDOL) 50000 units CAPS capsule Take 1 capsule (50,000 Units total) by mouth See admin instructions. Every other week. 30 capsule 1   No current facility-administered medications for this visit.    OBJECTIVE: Older white woman In no acute distress Filed Vitals:   12/21/15 0925  BP: 138/65  Pulse: 70  Temp: 97.7 F (36.5 C)  Resp: 18  Body mass index is 22.48 kg/(m^2).    ECOG FS:1 - Symptomatic but completely ambulatory  Sclerae unicteric, pupils round and equal Oropharynx clear and moist-- no thrush or other lesions No cervical or supraclavicular adenopathy Lungs no rales or rhonchi Heart regular rate and rhythm Abd soft, nontender, positive bowel sounds MSK no focal spinal tenderness, no upper extremity lymphedema Neuro: nonfocal, well oriented, appropriate affect Breasts: No right axillary adenopathy palpable    LAB RESULTS:  CMP     Component Value Date/Time   NA 140 12/08/2015 1217   NA 138 05/25/2015 0332   K 3.5 12/08/2015 1217   K 4.1 05/25/2015 0332   CL 104 05/25/2015 0332   CO2 29 12/08/2015 1217   CO2 28 05/25/2015 0332   GLUCOSE 86 12/08/2015 1217   GLUCOSE 96 05/25/2015 0332   BUN 13.0 12/08/2015 1217   BUN 17 05/25/2015 0332   CREATININE 0.9 12/08/2015 1217   CREATININE 0.93 05/25/2015 0332   CALCIUM 9.6 12/08/2015 1217   CALCIUM 8.8* 05/25/2015 0332   PROT 6.8 12/08/2015 1217   PROT 6.3* 05/24/2015 0415   ALBUMIN 3.6 12/08/2015 1217   ALBUMIN 3.4* 05/24/2015 0415    AST 20 12/08/2015 1217   AST 15 05/24/2015 0415   ALT 15 12/08/2015 1217   ALT 11* 05/24/2015 0415   ALKPHOS 66 12/08/2015 1217   ALKPHOS 56 05/24/2015 0415   BILITOT 0.57 12/08/2015 1217   BILITOT 1.3* 05/24/2015 0415   GFRNONAA 58* 05/25/2015 0332   GFRAA >60 05/25/2015 0332    INo results found for: SPEP, UPEP  Lab Results  Component Value Date   WBC 6.6 12/21/2015   NEUTROABS 4.8 12/21/2015   HGB 11.6 12/21/2015   HCT 35.0 12/21/2015   MCV 93.2 12/21/2015   PLT 129* 12/21/2015      Chemistry      Component Value Date/Time   NA 140 12/08/2015 1217   NA 138 05/25/2015 0332   K 3.5 12/08/2015 1217   K 4.1 05/25/2015 0332   CL 104 05/25/2015 0332   CO2 29 12/08/2015 1217   CO2 28 05/25/2015 0332   BUN 13.0 12/08/2015 1217   BUN 17 05/25/2015 0332   CREATININE 0.9 12/08/2015 1217   CREATININE 0.93 05/25/2015 0332      Component Value Date/Time   CALCIUM 9.6 12/08/2015 1217   CALCIUM 8.8* 05/25/2015 0332   ALKPHOS 66 12/08/2015 1217   ALKPHOS 56 05/24/2015 0415   AST 20 12/08/2015 1217   AST 15 05/24/2015 0415   ALT 15 12/08/2015 1217   ALT 11* 05/24/2015 0415   BILITOT 0.57 12/08/2015 1217   BILITOT 1.3* 05/24/2015 0415       No results found for: LABCA2  No components found for: LABCA125  No results for input(s): INR in the last 168 hours.  Urinalysis    Component Value Date/Time   COLORURINE YELLOW 04/21/2012 0946   APPEARANCEUR CLOUDY* 04/21/2012 0946   LABSPEC 1.019 04/21/2012 0946   PHURINE 7.0 04/21/2012 0946   GLUCOSEU NEGATIVE 04/21/2012 0946   HGBUR NEGATIVE 04/21/2012 0946   BILIRUBINUR NEGATIVE 04/21/2012 0946   KETONESUR NEGATIVE 04/21/2012 0946   PROTEINUR NEGATIVE 04/21/2012 0946   UROBILINOGEN 1.0 04/21/2012 0946   NITRITE NEGATIVE 04/21/2012 0946   LEUKOCYTESUR NEGATIVE 04/21/2012 0946    STUDIES: No results found. .  ASSESSMENT: 80 y.o. [place] woman with acute myeloid leukemia diagnosed by bone marrow biopsy  05/25/2015, with the blast count between 17 and 30% depending on  the method of determination.   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  PLAN:  [name] tolerated her seventh cycle of azacitidine without event. She continues to have excellent blood counts and clinically she is in remission.  She does need her romiplostim and will receive a dose today we continue to follow her platelet count on a weekly basis and dose accordingly.  She will have her next 2 azacitabine courses beginning 01/08/2016 and 02/05/2016. After the August treatments, in the first week in September she will have a repeat bone marrow biopsy.  She will then see me the second week in September to discuss results. She is also scheduled to meet with Dr. Nadara Mustard at [place] 03/07/2016. At that point we will decide what to do regarding further azacitidine treatments  As far as her right axillary lymph node is concerned, she is already scheduled for repeat ultrasonography at [facility] in August.  At this point I'm delighted that [name] is doing so well. She knows to call for any problems that may develop before her next visit here. [name], Scott   12/21/2015 9:41 AM

## 2015-12-21 NOTE — Telephone Encounter (Signed)
appt made and avs printed °

## 2015-12-22 ENCOUNTER — Ambulatory Visit: Payer: Medicare Other

## 2015-12-22 ENCOUNTER — Other Ambulatory Visit: Payer: Medicare Other

## 2015-12-29 ENCOUNTER — Other Ambulatory Visit (HOSPITAL_BASED_OUTPATIENT_CLINIC_OR_DEPARTMENT_OTHER): Payer: Medicare Other

## 2015-12-29 ENCOUNTER — Ambulatory Visit (HOSPITAL_BASED_OUTPATIENT_CLINIC_OR_DEPARTMENT_OTHER): Payer: Medicare Other

## 2015-12-29 VITALS — BP 140/73 | HR 75 | Temp 98.0°F | Resp 20

## 2015-12-29 DIAGNOSIS — C92Z0 Other myeloid leukemia not having achieved remission: Secondary | ICD-10-CM | POA: Diagnosis not present

## 2015-12-29 DIAGNOSIS — D696 Thrombocytopenia, unspecified: Secondary | ICD-10-CM

## 2015-12-29 DIAGNOSIS — D61818 Other pancytopenia: Secondary | ICD-10-CM

## 2015-12-29 DIAGNOSIS — D693 Immune thrombocytopenic purpura: Secondary | ICD-10-CM | POA: Diagnosis not present

## 2015-12-29 DIAGNOSIS — C9200 Acute myeloblastic leukemia, not having achieved remission: Secondary | ICD-10-CM

## 2015-12-29 LAB — CBC WITH DIFFERENTIAL/PLATELET
BASO%: 0.2 % (ref 0.0–2.0)
Basophils Absolute: 0 10*3/uL (ref 0.0–0.1)
EOS%: 2 % (ref 0.0–7.0)
Eosinophils Absolute: 0.1 10*3/uL (ref 0.0–0.5)
HCT: 35 % (ref 34.8–46.6)
HGB: 11.7 g/dL (ref 11.6–15.9)
LYMPH%: 23.2 % (ref 14.0–49.7)
MCH: 31.1 pg (ref 25.1–34.0)
MCHC: 33.4 g/dL (ref 31.5–36.0)
MCV: 93.1 fL (ref 79.5–101.0)
MONO#: 0.4 10*3/uL (ref 0.1–0.9)
MONO%: 8.9 % (ref 0.0–14.0)
NEUT%: 65.7 % (ref 38.4–76.8)
NEUTROS ABS: 3.2 10*3/uL (ref 1.5–6.5)
PLATELETS: 151 10*3/uL (ref 145–400)
RBC: 3.76 10*6/uL (ref 3.70–5.45)
RDW: 16.1 % — ABNORMAL HIGH (ref 11.2–14.5)
WBC: 4.9 10*3/uL (ref 3.9–10.3)
lymph#: 1.1 10*3/uL (ref 0.9–3.3)

## 2015-12-29 LAB — COMPREHENSIVE METABOLIC PANEL
ALBUMIN: 3.5 g/dL (ref 3.5–5.0)
ALT: 13 U/L (ref 0–55)
AST: 17 U/L (ref 5–34)
Alkaline Phosphatase: 67 U/L (ref 40–150)
Anion Gap: 10 mEq/L (ref 3–11)
BUN: 16.6 mg/dL (ref 7.0–26.0)
CALCIUM: 9.1 mg/dL (ref 8.4–10.4)
CHLORIDE: 104 meq/L (ref 98–109)
CO2: 27 mEq/L (ref 22–29)
CREATININE: 1 mg/dL (ref 0.6–1.1)
EGFR: 55 mL/min/{1.73_m2} — ABNORMAL LOW (ref >90)
Glucose: 87 mg/dl (ref 70–140)
Potassium: 3.6 mEq/L (ref 3.5–5.1)
Sodium: 141 mEq/L (ref 136–145)
Total Bilirubin: 0.63 mg/dL (ref 0.20–1.20)
Total Protein: 6.4 g/dL (ref 6.4–8.3)

## 2015-12-29 LAB — CHCC SMEAR

## 2015-12-29 MED ORDER — ROMIPLOSTIM 250 MCG ~~LOC~~ SOLR
60.0000 ug | Freq: Once | SUBCUTANEOUS | Status: AC
  Administered 2015-12-29: 60 ug via SUBCUTANEOUS
  Filled 2015-12-29: qty 0.12

## 2015-12-29 NOTE — Patient Instructions (Signed)
Romiplostim injection What is this medicine? ROMIPLOSTIM (roe mi PLOE stim) helps your body make more platelets. This medicine is used to treat low platelets caused by chronic idiopathic thrombocytopenic purpura (ITP). This medicine may be used for other purposes; ask your health care provider or pharmacist if you have questions. What should I tell my health care provider before I take this medicine? They need to know if you have any of these conditions: -cancer or myelodysplastic syndrome -low blood counts, like low white cell, platelet, or red cell counts -take medicines that treat or prevent blood clots -an unusual or allergic reaction to romiplostim, mannitol, other medicines, foods, dyes, or preservatives -pregnant or trying to get pregnant -breast-feeding How should I use this medicine? This medicine is for injection under the skin. It is given by a health care professional in a hospital or clinic setting. A special MedGuide will be given to you before your injection. Read this information carefully each time. Talk to your pediatrician regarding the use of this medicine in children. Special care may be needed. Overdosage: If you think you have taken too much of this medicine contact a poison control center or emergency room at once. NOTE: This medicine is only for you. Do not share this medicine with others. What if I miss a dose? It is important not to miss your dose. Call your doctor or health care professional if you are unable to keep an appointment. What may interact with this medicine? Interactions are not expected. This list may not describe all possible interactions. Give your health care provider a list of all the medicines, herbs, non-prescription drugs, or dietary supplements you use. Also tell them if you smoke, drink alcohol, or use illegal drugs. Some items may interact with your medicine. What should I watch for while using this medicine? Your condition will be monitored  carefully while you are receiving this medicine. Visit your prescriber or health care professional for regular checks on your progress and for the needed blood tests. It is important to keep all appointments. What side effects may I notice from receiving this medicine? Side effects that you should report to your doctor or health care professional as soon as possible: -allergic reactions like skin rash, itching or hives, swelling of the face, lips, or tongue -shortness of breath, chest pain, swelling in a leg -unusual bleeding or bruising Side effects that usually do not require medical attention (report to your doctor or health care professional if they continue or are bothersome): -dizziness -headache -muscle aches -pain in arms and legs -stomach pain -trouble sleeping This list may not describe all possible side effects. Call your doctor for medical advice about side effects. You may report side effects to FDA at 1-800-FDA-1088. Where should I keep my medicine? This drug is given in a hospital or clinic and will not be stored at home. NOTE: This sheet is a summary. It may not cover all possible information. If you have questions about this medicine, talk to your doctor, pharmacist, or health care provider.    2016, Elsevier/Gold Standard. (2008-02-08 15:13:04)  

## 2016-01-05 ENCOUNTER — Other Ambulatory Visit: Payer: Medicare Other

## 2016-01-07 NOTE — Progress Notes (Signed)
[name]  [phone] [phone]   ID: [name] DOB: September 16, 1935  MR#: [id number]  DVV#:[id number]  Patient Care Team: Alisha Circle, FNP as PCP - General (Family Medicine) PCP: Alisha Po, FNP, [name] GYN: SU:  OTHER MD: [name], [name], [name]  CHIEF COMPLAINT: Acute myeloid leukemia; ITP  CURRENT TREATMENT: Azacitidine, romiplostim  HISTORY OF PRESENT ILLNESS: From [facility] 05/22/2025 consult note:  "Alisha patient was evaluated at her PCP's office 05/22/2015 with a complaint of DOE worsening over several months. Labwork was obtained including a CBC, whioch showed WBC 1.8, platelets 14K, and Hb 5.9 with an MCV of 97.5. DDimer was 1.04. Accordingly Alisha patient was referred to [facility] ED and was admitted 05/23/2015.   Labwork since admission includes a repeat CBC with WBC 1.7, HB 5.4, MCV 100.6 and platelets 10K. Creat was 1.04 with GFR 50. Reticulocytes are not elevated with abs 39.3 (1.7%) and LDH is normal at 188. Other labs are pending.  Alisha patient tells me because of peripheral neuropathy she has been receiving monthly B-12 shots since 2005."  Bone marrow biopsy 05/25/2015 showed acute myeloid leukemia, with 17% blasts by flow cytometry, 24% by aspirate counts, and 20-30% by immunohistochemistry ([id number], and 898). Alisha blasts were positive for CD 117 and focally for MPO. There were also myelodysplasia related changes in all 3 cell lines.  Alisha patient's subsequent history is as detailed below.  INTERVAL HISTORY: [name] returns today for follow-up of her myelodysplasia/acute myeloid leukemia. Today is day 1 cycle 8 of her 7 day treatment. She also receives romiplostim as needed, with lab tests every Friday.  REVIEW OF SYSTEMS: [name] is stressed today because her schedule was entered incorrectly. She receives [facility] azacitabine in [facility] infusion area even though it is a "shot". She receives a romiplostim in  Al[facility] "shot" area. We are getting this straightened out. On Alisha plus side, she tolerates both medications with no side effects that she is aware of and in fact he feels "better" when receiving Alisha azacitabine. She has had no intercurrent fevers, bleeding, rash, unexplained fatigue or unexplained weight loss. A detailed review of systems today was otherwise entirely stable.  PAST MEDICAL HISTORY: Past Medical History  Diagnosis Date  . GERD (gastroesophageal reflux disease)   . Depression   . Hypertension   . Shortness of breath dyspnea     with exertion  . History of blood transfusion 05/23/2015    "Hgb 5.9"  . Anemia   . Arthritis     "hand joints; hips; back" (05/23/2015)  . Chronic lower back pain   . Anxiety     PAST SURGICAL HISTORY: Past Surgical History  Procedure Laterality Date  . Bone spur      R thigh  . Total shoulder arthroplasty  09/13/2011    Procedure: TOTAL SHOULDER ARTHROPLASTY;  Surgeon: Alisha Schooling, MD;  Location: [facility];  Service: Orthopedics;  Laterality: Left;  LEFT SHOULDER REVERSED TOTAL SHOULDER ARTHROPLASTY  . Total knee arthroplasty  04/27/2012    Procedure: TOTAL KNEE ARTHROPLASTY;  Surgeon: Alisha Pole, MD;  Location: [facility];  Service: Orthopedics;  Laterality: Right;  . Cataract extraction w/ intraocular lens  implant, bilateral  ~ 2005  . Tonsillectomy  ~ 1950  . Esophagogastroduodenoscopy (egd) with propofol N/A 11/08/2014    Procedure: ESOPHAGOGASTRODUODENOSCOPY (EGD) WITH PROPOFOL;  Surgeon: Alisha Fair, MD;  Location: [facility];  Service: Endoscopy;  Laterality: N/A;  . Joint replacement    .  Dilation and curettage of uterus      FAMILY HISTORY Family History  Problem Relation Age of Onset  . Heart attack Mother 37    Died age 71  . Heart disease Brother     Atrial fib  Patient's father died age 16 with prostate cancer; mother died atv50 with an MI. One brother, alive at 70; one sister with parkinson's. No blood or cancer  problems otherwise in family  GYNECOLOGIC HISTORY:  No LMP recorded. Patient is postmenopausal. Menarche age 60, first live birth age 85, she is GXP3; menopause age 89, no HR  SOCIAL HISTORY:  Grade school Pharmacist, hospital, retired; husband was a Higher education careers adviser. He has severe heart disease and she is his main caretaker. Son [name] works for [name] and sings in [facility] [name]; daughter [name] lives in [place], a homemaker; daughter [name] works at [facility] [employer] in [employer] as an Therapist, sports. Alisha patient has 7 gch. She is a Psychologist, forensic    ADVANCED DIRECTIVES: in place; [facility]isha patient's son [name] is her [name]: Social History  Substance Use Topics  . Smoking status: Never Smoker   . Smokeless tobacco: Never Used  . Alcohol Use: No     Colonoscopy:  PAP:  Bone density:  Lipid panel:  Allergies  Allergen Reactions  . Cozaar [Losartan] Other (See Comments)    Cause pt to lose her taste for foods  . Codeine Itching and Other (See Comments)    Makes feel crazy  PT STATES SHE ALSO CAN NOT TAKE Alisha SYNTHETIC CODEINES  . Hydrocodone Itching    Tolerates with benadryl  . Oxycodone Itching  . Tetanus Toxoids Swelling and Rash    Current Outpatient Prescriptions  Medication Sig Dispense Refill  . amLODipine (NORVASC) 2.5 MG tablet TAKE ONE TABLET BY MOUTH ONCE DAILY 90 tablet 5  . Cyanocobalamin 1000 MCG/ML KIT Inject 1,000 mcg as directed every 30 (thirty) days. B 12 shot 10 kit 1  . hydrochlorothiazide (HYDRODIURIL) 25 MG tablet TAKE ONE TABLET BY MOUTH ONCE DAILY 90 tablet 0  . Melatonin 5 MG TABS Take 2.5 mg by mouth at bedtime.    . Multiple Vitamin (MULTIVITAMIN WITH MINERALS) TABS tablet Take 1 tablet by mouth daily.    . pantoprazole (PROTONIX) 40 MG tablet Take 40 mg by mouth every morning.    . valACYclovir (VALTREX) 1000 MG tablet Take 1,000 mg by mouth 2 (two) times daily.     . Venlafaxine HCl 150 MG TB24 TAKE ONE TABLET BY MOUTH ONCE DAILY 30 tablet 5  . Vitamin D,  Ergocalciferol, (DRISDOL) 50000 units CAPS capsule Take 1 capsule (50,000 Units total) by mouth See admin instructions. Every other week. 30 capsule 1   No current facility-administered medications for this visit.    OBJECTIVE: Older white woman Who appears stated age  62 Vitals:   01/08/16 1149  BP: 135/60  Pulse: 73  Temp: 97.8 F (36.6 C)  Resp: 17     Body mass index is 22.25 kg/(m^2).    ECOG FS:1 - Symptomatic but completely ambulatory  Sclerae unicteric, EOMs intact Oropharynx clear and moist No cervical or supraclavicular adenopathy Lungs no rales or rhonchi Heart regular rate and rhythm Abd soft, nontender, positive bowel sounds MSK no focal spinal tenderness, no upper extremity lymphedema Neuro: nonfocal, well oriented, appropriate affect Breasts: Deferred   LAB RESULTS:  CMP     Component Value Date/Time   NA 140 01/08/2016 1034   NA 138  05/25/2015 0332   K 3.6 01/08/2016 1034   K 4.1 05/25/2015 0332   CL 104 05/25/2015 0332   CO2 28 01/08/2016 1034   CO2 28 05/25/2015 0332   GLUCOSE 101 01/08/2016 1034   GLUCOSE 96 05/25/2015 0332   BUN 13.2 01/08/2016 1034   BUN 17 05/25/2015 0332   CREATININE 0.9 01/08/2016 1034   CREATININE 0.93 05/25/2015 0332   CALCIUM 9.2 01/08/2016 1034   CALCIUM 8.8* 05/25/2015 0332   PROT 6.6 01/08/2016 1034   PROT 6.3* 05/24/2015 0415   ALBUMIN 3.5 01/08/2016 1034   ALBUMIN 3.4* 05/24/2015 0415   AST 18 01/08/2016 1034   AST 15 05/24/2015 0415   ALT 14 01/08/2016 1034   ALT 11* 05/24/2015 0415   ALKPHOS 69 01/08/2016 1034   ALKPHOS 56 05/24/2015 0415   BILITOT 0.53 01/08/2016 1034   BILITOT 1.3* 05/24/2015 0415   GFRNONAA 58* 05/25/2015 0332   GFRAA >60 05/25/2015 0332    INo results found for: SPEP, UPEP  Lab Results  Component Value Date   WBC 3.6* 01/08/2016   NEUTROABS 2.1 01/08/2016   HGB 11.8 01/08/2016   HCT 35.1 01/08/2016   MCV 94.5 01/08/2016   PLT 316 01/08/2016      Chemistry        Component Value Date/Time   NA 140 01/08/2016 1034   NA 138 05/25/2015 0332   K 3.6 01/08/2016 1034   K 4.1 05/25/2015 0332   CL 104 05/25/2015 0332   CO2 28 01/08/2016 1034   CO2 28 05/25/2015 0332   BUN 13.2 01/08/2016 1034   BUN 17 05/25/2015 0332   CREATININE 0.9 01/08/2016 1034   CREATININE 0.93 05/25/2015 0332      Component Value Date/Time   CALCIUM 9.2 01/08/2016 1034   CALCIUM 8.8* 05/25/2015 0332   ALKPHOS 69 01/08/2016 1034   ALKPHOS 56 05/24/2015 0415   AST 18 01/08/2016 1034   AST 15 05/24/2015 0415   ALT 14 01/08/2016 1034   ALT 11* 05/24/2015 0415   BILITOT 0.53 01/08/2016 1034   BILITOT 1.3* 05/24/2015 0415       No results found for: LABCA2  No components found for: LABCA125  No results for input(s): INR in Alisha last 168 hours.  Urinalysis    Component Value Date/Time   COLORURINE YELLOW 04/21/2012 0946   APPEARANCEUR CLOUDY* 04/21/2012 0946   LABSPEC 1.019 04/21/2012 0946   PHURINE 7.0 04/21/2012 0946   GLUCOSEU NEGATIVE 04/21/2012 0946   HGBUR NEGATIVE 04/21/2012 0946   BILIRUBINUR NEGATIVE 04/21/2012 0946   KETONESUR NEGATIVE 04/21/2012 0946   PROTEINUR NEGATIVE 04/21/2012 0946   UROBILINOGEN 1.0 04/21/2012 0946   NITRITE NEGATIVE 04/21/2012 0946   LEUKOCYTESUR NEGATIVE 04/21/2012 0946    STUDIES: No results found. .  ASSESSMENT: 80 y.o. [place] woman with acute myeloid leukemia diagnosed by bone marrow biopsy 05/25/2015, with Alisha blast count between 17 and 30% depending on Alisha method of determination.   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, with gain of 2 and 19  (b) consider allogenic transplant  (c) all transfusion products to be irradiated  (1) started sQ azacitidine 06/05/2015, receiving 7 doses every 28 day cycle  (2) immune thrombocytopenic purpura  (a) s/p IVIG 07/06/2015 (1g/kg x 2d) with excellent response  (b) romiplostim first dose 07/07/2015, started weekly as of 08/04/2015  (3)  biopsy of an enlarged right axillary lymph node at [facility] 11/13/2015 was nondiagnostic.  PLAN:  Zaylee  had some mistakes in her schedule--she was set up for "shots" instead of in [facility] infusion area. We are getting that fixed.  She is a little bit down because of family issues, but is pulling herself up and she tells me when she gets her [place] it "perks her up."  We are continuing to check her lab work every Friday and she receives romiplostim as needed. She has had a wonderful response as far as her platelet count is concerned  She will see me again at [facility] beginning of Alisha next cycle and she will have a bone marrow biopsy after that, before meeting with Dr. Nadara Mustard in September.  She knows to call for any problems that may develop before her next visit here.   [name], MD   01/08/2016 6:23 PM

## 2016-01-08 ENCOUNTER — Ambulatory Visit (HOSPITAL_BASED_OUTPATIENT_CLINIC_OR_DEPARTMENT_OTHER): Payer: Medicare Other

## 2016-01-08 ENCOUNTER — Other Ambulatory Visit (HOSPITAL_BASED_OUTPATIENT_CLINIC_OR_DEPARTMENT_OTHER): Payer: Medicare Other

## 2016-01-08 ENCOUNTER — Ambulatory Visit (HOSPITAL_BASED_OUTPATIENT_CLINIC_OR_DEPARTMENT_OTHER): Payer: Medicare Other | Admitting: Oncology

## 2016-01-08 ENCOUNTER — Ambulatory Visit: Payer: Medicare Other

## 2016-01-08 VITALS — BP 135/60 | HR 73 | Temp 97.8°F | Resp 17 | Ht 68.0 in | Wt 146.3 lb

## 2016-01-08 DIAGNOSIS — C9200 Acute myeloblastic leukemia, not having achieved remission: Secondary | ICD-10-CM

## 2016-01-08 DIAGNOSIS — D693 Immune thrombocytopenic purpura: Secondary | ICD-10-CM

## 2016-01-08 DIAGNOSIS — D696 Thrombocytopenia, unspecified: Secondary | ICD-10-CM

## 2016-01-08 DIAGNOSIS — Z5111 Encounter for antineoplastic chemotherapy: Secondary | ICD-10-CM

## 2016-01-08 DIAGNOSIS — C92Z0 Other myeloid leukemia not having achieved remission: Secondary | ICD-10-CM

## 2016-01-08 DIAGNOSIS — D61818 Other pancytopenia: Secondary | ICD-10-CM

## 2016-01-08 LAB — COMPREHENSIVE METABOLIC PANEL
ALT: 14 U/L (ref 0–55)
AST: 18 U/L (ref 5–34)
Albumin: 3.5 g/dL (ref 3.5–5.0)
Alkaline Phosphatase: 69 U/L (ref 40–150)
Anion Gap: 9 mEq/L (ref 3–11)
BUN: 13.2 mg/dL (ref 7.0–26.0)
CHLORIDE: 103 meq/L (ref 98–109)
CO2: 28 mEq/L (ref 22–29)
Calcium: 9.2 mg/dL (ref 8.4–10.4)
Creatinine: 0.9 mg/dL (ref 0.6–1.1)
EGFR: 59 mL/min/{1.73_m2} — ABNORMAL LOW (ref >90)
GLUCOSE: 101 mg/dL (ref 70–140)
POTASSIUM: 3.6 meq/L (ref 3.5–5.1)
SODIUM: 140 meq/L (ref 136–145)
Total Bilirubin: 0.53 mg/dL (ref 0.20–1.20)
Total Protein: 6.6 g/dL (ref 6.4–8.3)

## 2016-01-08 LAB — CBC WITH DIFFERENTIAL/PLATELET
BASO%: 2.3 % — ABNORMAL HIGH (ref 0.0–2.0)
BASOS ABS: 0.1 10*3/uL (ref 0.0–0.1)
EOS%: 1.6 % (ref 0.0–7.0)
Eosinophils Absolute: 0.1 10*3/uL (ref 0.0–0.5)
HCT: 35.1 % (ref 34.8–46.6)
HGB: 11.8 g/dL (ref 11.6–15.9)
LYMPH%: 32.6 % (ref 14.0–49.7)
MCH: 31.6 pg (ref 25.1–34.0)
MCHC: 33.5 g/dL (ref 31.5–36.0)
MCV: 94.5 fL (ref 79.5–101.0)
MONO#: 0.2 10*3/uL (ref 0.1–0.9)
MONO%: 5 % (ref 0.0–14.0)
NEUT#: 2.1 10*3/uL (ref 1.5–6.5)
NEUT%: 58.5 % (ref 38.4–76.8)
Platelets: 316 10*3/uL (ref 145–400)
RBC: 3.72 10*6/uL (ref 3.70–5.45)
RDW: 17 % — ABNORMAL HIGH (ref 11.2–14.5)
WBC: 3.6 10*3/uL — ABNORMAL LOW (ref 3.9–10.3)
lymph#: 1.2 10*3/uL (ref 0.9–3.3)

## 2016-01-08 LAB — CHCC SMEAR

## 2016-01-08 MED ORDER — ONDANSETRON HCL 8 MG PO TABS
8.0000 mg | ORAL_TABLET | Freq: Once | ORAL | Status: AC
  Administered 2016-01-08: 8 mg via ORAL

## 2016-01-08 MED ORDER — ROMIPLOSTIM 250 MCG ~~LOC~~ SOLR
60.0000 ug | Freq: Once | SUBCUTANEOUS | Status: AC
  Administered 2016-01-08: 60 ug via SUBCUTANEOUS
  Filled 2016-01-08: qty 0.12

## 2016-01-08 MED ORDER — AZACITIDINE CHEMO SQ INJECTION
75.0000 mg/m2 | Freq: Once | INTRAMUSCULAR | Status: AC
  Administered 2016-01-08: 140 mg via SUBCUTANEOUS
  Filled 2016-01-08: qty 5.6

## 2016-01-08 MED ORDER — ONDANSETRON HCL 8 MG PO TABS
ORAL_TABLET | ORAL | Status: AC
  Filled 2016-01-08: qty 1

## 2016-01-08 NOTE — Patient Instructions (Signed)
[facility] Discharge Instructions for Patients Receiving Chemotherapy  Today you received the following chemotherapy agents Vidaza  To help prevent nausea and vomiting after your treatment, we encourage you to take your nausea medication as prescribed.    If you develop nausea and vomiting that is not controlled by your nausea medication, call the clinic.   BELOW ARE SYMPTOMS THAT SHOULD BE REPORTED IMMEDIATELY:  *FEVER GREATER THAN 100.5 F  *CHILLS WITH OR WITHOUT FEVER  NAUSEA AND VOMITING THAT IS NOT CONTROLLED WITH YOUR NAUSEA MEDICATION  *UNUSUAL SHORTNESS OF BREATH  *UNUSUAL BRUISING OR BLEEDING  TENDERNESS IN MOUTH AND THROAT WITH OR WITHOUT PRESENCE OF ULCERS  *URINARY PROBLEMS  *BOWEL PROBLEMS  UNUSUAL RASH Items with * indicate a potential emergency and should be followed up as soon as possible.  Feel free to call the clinic you have any questions or concerns. The clinic phone number is [phone].  Please show the CHEMO ALERT CARD at check-in to the Emergency Department and triage nurse.   

## 2016-01-08 NOTE — Patient Instructions (Signed)
Romiplostim injection What is this medicine? ROMIPLOSTIM (roe mi PLOE stim) helps your body make more platelets. This medicine is used to treat low platelets caused by chronic idiopathic thrombocytopenic purpura (ITP). This medicine may be used for other purposes; ask your health care provider or pharmacist if you have questions. What should I tell my health care provider before I take this medicine? They need to know if you have any of these conditions: -cancer or myelodysplastic syndrome -low blood counts, like low white cell, platelet, or red cell counts -take medicines that treat or prevent blood clots -an unusual or allergic reaction to romiplostim, mannitol, other medicines, foods, dyes, or preservatives -pregnant or trying to get pregnant -breast-feeding How should I use this medicine? This medicine is for injection under the skin. It is given by a health care professional in a hospital or clinic setting. A special MedGuide will be given to you before your injection. Read this information carefully each time. Talk to your pediatrician regarding the use of this medicine in children. Special care may be needed. Overdosage: If you think you have taken too much of this medicine contact a poison control center or emergency room at once. NOTE: This medicine is only for you. Do not share this medicine with others. What if I miss a dose? It is important not to miss your dose. Call your doctor or health care professional if you are unable to keep an appointment. What may interact with this medicine? Interactions are not expected. This list may not describe all possible interactions. Give your health care provider a list of all the medicines, herbs, non-prescription drugs, or dietary supplements you use. Also tell them if you smoke, drink alcohol, or use illegal drugs. Some items may interact with your medicine. What should I watch for while using this medicine? Your condition will be monitored  carefully while you are receiving this medicine. Visit your prescriber or health care professional for regular checks on your progress and for the needed blood tests. It is important to keep all appointments. What side effects may I notice from receiving this medicine? Side effects that you should report to your doctor or health care professional as soon as possible: -allergic reactions like skin rash, itching or hives, swelling of the face, lips, or tongue -shortness of breath, chest pain, swelling in a leg -unusual bleeding or bruising Side effects that usually do not require medical attention (report to your doctor or health care professional if they continue or are bothersome): -dizziness -headache -muscle aches -pain in arms and legs -stomach pain -trouble sleeping This list may not describe all possible side effects. Call your doctor for medical advice about side effects. You may report side effects to FDA at 1-800-FDA-1088. Where should I keep my medicine? This drug is given in a hospital or clinic and will not be stored at home. NOTE: This sheet is a summary. It may not cover all possible information. If you have questions about this medicine, talk to your doctor, pharmacist, or health care provider.    2016, Elsevier/Gold Standard. (2008-02-08 15:13:04)  

## 2016-01-09 ENCOUNTER — Ambulatory Visit (HOSPITAL_BASED_OUTPATIENT_CLINIC_OR_DEPARTMENT_OTHER): Payer: Medicare Other

## 2016-01-09 VITALS — BP 144/69 | HR 68 | Temp 98.0°F | Resp 16

## 2016-01-09 DIAGNOSIS — D61818 Other pancytopenia: Secondary | ICD-10-CM

## 2016-01-09 DIAGNOSIS — C92Z0 Other myeloid leukemia not having achieved remission: Secondary | ICD-10-CM | POA: Diagnosis not present

## 2016-01-09 DIAGNOSIS — Z5111 Encounter for antineoplastic chemotherapy: Secondary | ICD-10-CM | POA: Diagnosis not present

## 2016-01-09 DIAGNOSIS — C9200 Acute myeloblastic leukemia, not having achieved remission: Secondary | ICD-10-CM

## 2016-01-09 MED ORDER — ONDANSETRON HCL 8 MG PO TABS
8.0000 mg | ORAL_TABLET | Freq: Once | ORAL | Status: AC
  Administered 2016-01-09: 8 mg via ORAL

## 2016-01-09 MED ORDER — ONDANSETRON HCL 8 MG PO TABS
ORAL_TABLET | ORAL | Status: AC
  Filled 2016-01-09: qty 1

## 2016-01-09 MED ORDER — AZACITIDINE CHEMO SQ INJECTION
75.0000 mg/m2 | Freq: Once | INTRAMUSCULAR | Status: AC
  Administered 2016-01-09: 140 mg via SUBCUTANEOUS
  Filled 2016-01-09: qty 5.6

## 2016-01-09 NOTE — Patient Instructions (Signed)
[facility] Discharge Instructions for Patients Receiving Chemotherapy  Today you received the following chemotherapy agents Vidaza  To help prevent nausea and vomiting after your treatment, we encourage you to take your nausea medication as prescribed.    If you develop nausea and vomiting that is not controlled by your nausea medication, call the clinic.   BELOW ARE SYMPTOMS THAT SHOULD BE REPORTED IMMEDIATELY:  *FEVER GREATER THAN 100.5 F  *CHILLS WITH OR WITHOUT FEVER  NAUSEA AND VOMITING THAT IS NOT CONTROLLED WITH YOUR NAUSEA MEDICATION  *UNUSUAL SHORTNESS OF BREATH  *UNUSUAL BRUISING OR BLEEDING  TENDERNESS IN MOUTH AND THROAT WITH OR WITHOUT PRESENCE OF ULCERS  *URINARY PROBLEMS  *BOWEL PROBLEMS  UNUSUAL RASH Items with * indicate a potential emergency and should be followed up as soon as possible.  Feel free to call the clinic you have any questions or concerns. The clinic phone number is [phone].  Please show the CHEMO ALERT CARD at check-in to the Emergency Department and triage nurse.   

## 2016-01-10 ENCOUNTER — Ambulatory Visit (HOSPITAL_BASED_OUTPATIENT_CLINIC_OR_DEPARTMENT_OTHER): Payer: Medicare Other

## 2016-01-10 VITALS — BP 130/60 | HR 75 | Temp 97.8°F | Resp 18

## 2016-01-10 DIAGNOSIS — D61818 Other pancytopenia: Secondary | ICD-10-CM

## 2016-01-10 DIAGNOSIS — C9200 Acute myeloblastic leukemia, not having achieved remission: Secondary | ICD-10-CM

## 2016-01-10 DIAGNOSIS — Z5111 Encounter for antineoplastic chemotherapy: Secondary | ICD-10-CM

## 2016-01-10 DIAGNOSIS — C92Z0 Other myeloid leukemia not having achieved remission: Secondary | ICD-10-CM | POA: Diagnosis not present

## 2016-01-10 MED ORDER — AZACITIDINE CHEMO SQ INJECTION
75.0000 mg/m2 | Freq: Once | INTRAMUSCULAR | Status: AC
  Administered 2016-01-10: 140 mg via SUBCUTANEOUS
  Filled 2016-01-10: qty 5.6

## 2016-01-10 MED ORDER — ONDANSETRON HCL 8 MG PO TABS
8.0000 mg | ORAL_TABLET | Freq: Once | ORAL | Status: AC
  Administered 2016-01-10: 8 mg via ORAL

## 2016-01-10 MED ORDER — ONDANSETRON HCL 8 MG PO TABS
ORAL_TABLET | ORAL | Status: AC
  Filled 2016-01-10: qty 1

## 2016-01-10 NOTE — Patient Instructions (Signed)
[facility] Discharge Instructions for Patients Receiving Chemotherapy  Today you received the following chemotherapy agents Vidaza  To help prevent nausea and vomiting after your treatment, we encourage you to take your nausea medication as prescribed.    If you develop nausea and vomiting that is not controlled by your nausea medication, call the clinic.   BELOW ARE SYMPTOMS THAT SHOULD BE REPORTED IMMEDIATELY:  *FEVER GREATER THAN 100.5 F  *CHILLS WITH OR WITHOUT FEVER  NAUSEA AND VOMITING THAT IS NOT CONTROLLED WITH YOUR NAUSEA MEDICATION  *UNUSUAL SHORTNESS OF BREATH  *UNUSUAL BRUISING OR BLEEDING  TENDERNESS IN MOUTH AND THROAT WITH OR WITHOUT PRESENCE OF ULCERS  *URINARY PROBLEMS  *BOWEL PROBLEMS  UNUSUAL RASH Items with * indicate a potential emergency and should be followed up as soon as possible.  Feel free to call the clinic you have any questions or concerns. The clinic phone number is [phone].  Please show the CHEMO ALERT CARD at check-in to the Emergency Department and triage nurse.   

## 2016-01-11 ENCOUNTER — Ambulatory Visit (HOSPITAL_BASED_OUTPATIENT_CLINIC_OR_DEPARTMENT_OTHER): Payer: Medicare Other

## 2016-01-11 VITALS — BP 154/73 | HR 66 | Temp 98.4°F | Resp 16

## 2016-01-11 DIAGNOSIS — C9200 Acute myeloblastic leukemia, not having achieved remission: Secondary | ICD-10-CM

## 2016-01-11 DIAGNOSIS — D61818 Other pancytopenia: Secondary | ICD-10-CM

## 2016-01-11 DIAGNOSIS — Z5111 Encounter for antineoplastic chemotherapy: Secondary | ICD-10-CM | POA: Diagnosis not present

## 2016-01-11 DIAGNOSIS — C92Z0 Other myeloid leukemia not having achieved remission: Secondary | ICD-10-CM

## 2016-01-11 MED ORDER — ONDANSETRON HCL 8 MG PO TABS
ORAL_TABLET | ORAL | Status: AC
  Filled 2016-01-11: qty 1

## 2016-01-11 MED ORDER — AZACITIDINE CHEMO SQ INJECTION
75.0000 mg/m2 | Freq: Once | INTRAMUSCULAR | Status: AC
  Administered 2016-01-11: 140 mg via SUBCUTANEOUS
  Filled 2016-01-11: qty 5.6

## 2016-01-11 MED ORDER — ONDANSETRON HCL 8 MG PO TABS
8.0000 mg | ORAL_TABLET | Freq: Once | ORAL | Status: AC
  Administered 2016-01-11: 8 mg via ORAL

## 2016-01-11 NOTE — Patient Instructions (Signed)
[facility] Discharge Instructions for Patients Receiving Chemotherapy  Today you received the following chemotherapy agents: Vidaza   To help prevent nausea and vomiting after your treatment, we encourage you to take your nausea medication as directed.    If you develop nausea and vomiting that is not controlled by your nausea medication, call the clinic.   BELOW ARE SYMPTOMS THAT SHOULD BE REPORTED IMMEDIATELY:  *FEVER GREATER THAN 100.5 F  *CHILLS WITH OR WITHOUT FEVER  NAUSEA AND VOMITING THAT IS NOT CONTROLLED WITH YOUR NAUSEA MEDICATION  *UNUSUAL SHORTNESS OF BREATH  *UNUSUAL BRUISING OR BLEEDING  TENDERNESS IN MOUTH AND THROAT WITH OR WITHOUT PRESENCE OF ULCERS  *URINARY PROBLEMS  *BOWEL PROBLEMS  UNUSUAL RASH Items with * indicate a potential emergency and should be followed up as soon as possible.  Feel free to call the clinic you have any questions or concerns. The clinic phone number is [phone].  Please show the CHEMO ALERT CARD at check-in to the Emergency Department and triage nurse.   

## 2016-01-12 ENCOUNTER — Ambulatory Visit (HOSPITAL_BASED_OUTPATIENT_CLINIC_OR_DEPARTMENT_OTHER): Payer: Medicare Other

## 2016-01-12 ENCOUNTER — Ambulatory Visit: Payer: Medicare Other

## 2016-01-12 ENCOUNTER — Other Ambulatory Visit (HOSPITAL_BASED_OUTPATIENT_CLINIC_OR_DEPARTMENT_OTHER): Payer: Medicare Other

## 2016-01-12 VITALS — BP 158/76 | HR 74 | Temp 97.8°F | Resp 17

## 2016-01-12 DIAGNOSIS — Z5111 Encounter for antineoplastic chemotherapy: Secondary | ICD-10-CM

## 2016-01-12 DIAGNOSIS — C9200 Acute myeloblastic leukemia, not having achieved remission: Secondary | ICD-10-CM

## 2016-01-12 DIAGNOSIS — D696 Thrombocytopenia, unspecified: Secondary | ICD-10-CM

## 2016-01-12 DIAGNOSIS — D61818 Other pancytopenia: Secondary | ICD-10-CM

## 2016-01-12 DIAGNOSIS — C92Z0 Other myeloid leukemia not having achieved remission: Secondary | ICD-10-CM

## 2016-01-12 LAB — CBC WITH DIFFERENTIAL/PLATELET
BASO%: 0.6 % (ref 0.0–2.0)
Basophils Absolute: 0 10*3/uL (ref 0.0–0.1)
EOS%: 1.6 % (ref 0.0–7.0)
Eosinophils Absolute: 0.1 10*3/uL (ref 0.0–0.5)
HCT: 35.8 % (ref 34.8–46.6)
HGB: 11.9 g/dL (ref 11.6–15.9)
LYMPH%: 31.8 % (ref 14.0–49.7)
MCH: 31.6 pg (ref 25.1–34.0)
MCHC: 33.2 g/dL (ref 31.5–36.0)
MCV: 95.2 fL (ref 79.5–101.0)
MONO#: 0.2 10*3/uL (ref 0.1–0.9)
MONO%: 6.7 % (ref 0.0–14.0)
NEUT#: 1.9 10*3/uL (ref 1.5–6.5)
NEUT%: 59.3 % (ref 38.4–76.8)
PLATELETS: 306 10*3/uL (ref 145–400)
RBC: 3.76 10*6/uL (ref 3.70–5.45)
RDW: 15.8 % — ABNORMAL HIGH (ref 11.2–14.5)
WBC: 3.1 10*3/uL — ABNORMAL LOW (ref 3.9–10.3)
lymph#: 1 10*3/uL (ref 0.9–3.3)

## 2016-01-12 LAB — CHCC SMEAR

## 2016-01-12 MED ORDER — ROMIPLOSTIM 250 MCG ~~LOC~~ SOLR: 60.0000 ug | Freq: Once | SUBCUTANEOUS | Status: DC

## 2016-01-12 MED ORDER — ONDANSETRON HCL 8 MG PO TABS
8.0000 mg | ORAL_TABLET | Freq: Once | ORAL | Status: AC
  Administered 2016-01-12: 8 mg via ORAL

## 2016-01-12 MED ORDER — ONDANSETRON HCL 8 MG PO TABS
ORAL_TABLET | ORAL | Status: AC
  Filled 2016-01-12: qty 1

## 2016-01-12 MED ORDER — AZACITIDINE CHEMO SQ INJECTION
75.0000 mg/m2 | Freq: Once | INTRAMUSCULAR | Status: AC
  Administered 2016-01-12: 140 mg via SUBCUTANEOUS
  Filled 2016-01-12: qty 5.6

## 2016-01-12 NOTE — Progress Notes (Signed)
1151:  2nd and 3rd syringe of Vidaza was not able to be injected. Pharmacy mixed up two additional syringes and the medication was administered.

## 2016-01-12 NOTE — Progress Notes (Unsigned)
Pltc = 306 today, MD clarified that he wants to HOLD Nplate today and when pts Pltc <200, resume 60 mcg of Nplate.  Kennith Center, Pharm.D., CPP 01/12/2016@11 :22 AM

## 2016-01-15 ENCOUNTER — Ambulatory Visit (HOSPITAL_BASED_OUTPATIENT_CLINIC_OR_DEPARTMENT_OTHER): Payer: Medicare Other

## 2016-01-15 ENCOUNTER — Ambulatory Visit: Payer: Medicare Other

## 2016-01-15 VITALS — BP 155/72 | HR 77 | Temp 97.8°F | Resp 18

## 2016-01-15 DIAGNOSIS — C92Z0 Other myeloid leukemia not having achieved remission: Secondary | ICD-10-CM

## 2016-01-15 DIAGNOSIS — D61818 Other pancytopenia: Secondary | ICD-10-CM

## 2016-01-15 DIAGNOSIS — C9200 Acute myeloblastic leukemia, not having achieved remission: Secondary | ICD-10-CM

## 2016-01-15 DIAGNOSIS — Z5111 Encounter for antineoplastic chemotherapy: Secondary | ICD-10-CM | POA: Diagnosis not present

## 2016-01-15 MED ORDER — ONDANSETRON HCL 8 MG PO TABS
ORAL_TABLET | ORAL | Status: AC
  Filled 2016-01-15: qty 1

## 2016-01-15 MED ORDER — AZACITIDINE CHEMO SQ INJECTION
75.0000 mg/m2 | Freq: Once | INTRAMUSCULAR | Status: AC
  Administered 2016-01-15: 140 mg via SUBCUTANEOUS
  Filled 2016-01-15: qty 5.6

## 2016-01-15 MED ORDER — ONDANSETRON HCL 8 MG PO TABS
8.0000 mg | ORAL_TABLET | Freq: Once | ORAL | Status: AC
Start: 2016-01-15 — End: 2016-01-15
  Administered 2016-01-15: 8 mg via ORAL

## 2016-01-15 NOTE — Patient Instructions (Signed)
[facility] Discharge Instructions for Patients Receiving Chemotherapy  Today you received the following chemotherapy agents Vidaza  To help prevent nausea and vomiting after your treatment, we encourage you to take your nausea medication as prescribed.    If you develop nausea and vomiting that is not controlled by your nausea medication, call the clinic.   BELOW ARE SYMPTOMS THAT SHOULD BE REPORTED IMMEDIATELY:  *FEVER GREATER THAN 100.5 F  *CHILLS WITH OR WITHOUT FEVER  NAUSEA AND VOMITING THAT IS NOT CONTROLLED WITH YOUR NAUSEA MEDICATION  *UNUSUAL SHORTNESS OF BREATH  *UNUSUAL BRUISING OR BLEEDING  TENDERNESS IN MOUTH AND THROAT WITH OR WITHOUT PRESENCE OF ULCERS  *URINARY PROBLEMS  *BOWEL PROBLEMS  UNUSUAL RASH Items with * indicate a potential emergency and should be followed up as soon as possible.  Feel free to call the clinic you have any questions or concerns. The clinic phone number is [phone].  Please show the CHEMO ALERT CARD at check-in to the Emergency Department and triage nurse.   

## 2016-01-16 ENCOUNTER — Ambulatory Visit (HOSPITAL_BASED_OUTPATIENT_CLINIC_OR_DEPARTMENT_OTHER): Payer: Medicare Other

## 2016-01-16 ENCOUNTER — Ambulatory Visit: Payer: Medicare Other

## 2016-01-16 VITALS — BP 155/70 | HR 73 | Temp 98.1°F | Resp 16

## 2016-01-16 DIAGNOSIS — C92Z0 Other myeloid leukemia not having achieved remission: Secondary | ICD-10-CM | POA: Diagnosis not present

## 2016-01-16 DIAGNOSIS — D61818 Other pancytopenia: Secondary | ICD-10-CM

## 2016-01-16 DIAGNOSIS — Z5111 Encounter for antineoplastic chemotherapy: Secondary | ICD-10-CM | POA: Diagnosis not present

## 2016-01-16 DIAGNOSIS — C9200 Acute myeloblastic leukemia, not having achieved remission: Secondary | ICD-10-CM

## 2016-01-16 MED ORDER — AZACITIDINE CHEMO SQ INJECTION
75.0000 mg/m2 | Freq: Once | INTRAMUSCULAR | Status: AC
  Administered 2016-01-16: 140 mg via SUBCUTANEOUS
  Filled 2016-01-16: qty 5.6

## 2016-01-16 MED ORDER — ONDANSETRON HCL 8 MG PO TABS
8.0000 mg | ORAL_TABLET | Freq: Once | ORAL | Status: AC
  Administered 2016-01-16: 8 mg via ORAL

## 2016-01-16 MED ORDER — ONDANSETRON HCL 8 MG PO TABS
ORAL_TABLET | ORAL | Status: AC
  Filled 2016-01-16: qty 1

## 2016-01-16 NOTE — Patient Instructions (Signed)
[facility] Discharge Instructions for Patients Receiving Chemotherapy  Today you received the following chemotherapy agents vidaza   To help prevent nausea and vomiting after your treatment, we encourage you to take your nausea medication as directed. If you develop nausea and vomiting that is not controlled by your nausea medication, call the clinic.   BELOW ARE SYMPTOMS THAT SHOULD BE REPORTED IMMEDIATELY:  *FEVER GREATER THAN 100.5 F  *CHILLS WITH OR WITHOUT FEVER  NAUSEA AND VOMITING THAT IS NOT CONTROLLED WITH YOUR NAUSEA MEDICATION  *UNUSUAL SHORTNESS OF BREATH  *UNUSUAL BRUISING OR BLEEDING  TENDERNESS IN MOUTH AND THROAT WITH OR WITHOUT PRESENCE OF ULCERS  *URINARY PROBLEMS  *BOWEL PROBLEMS  UNUSUAL RASH Items with * indicate a potential emergency and should be followed up as soon as possible.  Feel free to call the clinic you have any questions or concerns. The clinic phone number is [phone].  

## 2016-01-19 ENCOUNTER — Other Ambulatory Visit (HOSPITAL_BASED_OUTPATIENT_CLINIC_OR_DEPARTMENT_OTHER): Payer: Medicare Other

## 2016-01-19 ENCOUNTER — Ambulatory Visit (HOSPITAL_BASED_OUTPATIENT_CLINIC_OR_DEPARTMENT_OTHER): Payer: Medicare Other

## 2016-01-19 ENCOUNTER — Ambulatory Visit: Payer: Medicare Other

## 2016-01-19 VITALS — BP 172/67 | HR 73 | Temp 97.7°F | Resp 16

## 2016-01-19 DIAGNOSIS — D693 Immune thrombocytopenic purpura: Secondary | ICD-10-CM | POA: Diagnosis not present

## 2016-01-19 DIAGNOSIS — D696 Thrombocytopenia, unspecified: Secondary | ICD-10-CM

## 2016-01-19 DIAGNOSIS — D61818 Other pancytopenia: Secondary | ICD-10-CM

## 2016-01-19 DIAGNOSIS — C9200 Acute myeloblastic leukemia, not having achieved remission: Secondary | ICD-10-CM

## 2016-01-19 DIAGNOSIS — C92Z0 Other myeloid leukemia not having achieved remission: Secondary | ICD-10-CM | POA: Diagnosis not present

## 2016-01-19 LAB — CBC WITH DIFFERENTIAL/PLATELET
BASO%: 0.2 % (ref 0.0–2.0)
BASOS ABS: 0 10*3/uL (ref 0.0–0.1)
EOS%: 2.2 % (ref 0.0–7.0)
Eosinophils Absolute: 0.1 10*3/uL (ref 0.0–0.5)
HEMATOCRIT: 35.1 % (ref 34.8–46.6)
HGB: 11.8 g/dL (ref 11.6–15.9)
LYMPH%: 25.4 % (ref 14.0–49.7)
MCH: 31.4 pg (ref 25.1–34.0)
MCHC: 33.6 g/dL (ref 31.5–36.0)
MCV: 93.4 fL (ref 79.5–101.0)
MONO#: 0.3 10*3/uL (ref 0.1–0.9)
MONO%: 6.2 % (ref 0.0–14.0)
NEUT#: 3.5 10*3/uL (ref 1.5–6.5)
NEUT%: 66 % (ref 38.4–76.8)
PLATELETS: 200 10*3/uL (ref 145–400)
RBC: 3.76 10*6/uL (ref 3.70–5.45)
RDW: 15.8 % — ABNORMAL HIGH (ref 11.2–14.5)
WBC: 5.4 10*3/uL (ref 3.9–10.3)
lymph#: 1.4 10*3/uL (ref 0.9–3.3)

## 2016-01-19 LAB — CHCC SMEAR

## 2016-01-19 MED ORDER — ROMIPLOSTIM 250 MCG ~~LOC~~ SOLR
60.0000 ug | Freq: Once | SUBCUTANEOUS | Status: AC
  Administered 2016-01-19: 60 ug via SUBCUTANEOUS
  Filled 2016-01-19: qty 0.12

## 2016-01-19 NOTE — Patient Instructions (Signed)
Romiplostim injection What is this medicine? ROMIPLOSTIM (roe mi PLOE stim) helps your body make more platelets. This medicine is used to treat low platelets caused by chronic idiopathic thrombocytopenic purpura (ITP). This medicine may be used for other purposes; ask your health care provider or pharmacist if you have questions. What should I tell my health care provider before I take this medicine? They need to know if you have any of these conditions: -cancer or myelodysplastic syndrome -low blood counts, like low white cell, platelet, or red cell counts -take medicines that treat or prevent blood clots -an unusual or allergic reaction to romiplostim, mannitol, other medicines, foods, dyes, or preservatives -pregnant or trying to get pregnant -breast-feeding How should I use this medicine? This medicine is for injection under the skin. It is given by a health care professional in a hospital or clinic setting. A special MedGuide will be given to you before your injection. Read this information carefully each time. Talk to your pediatrician regarding the use of this medicine in children. Special care may be needed. Overdosage: If you think you have taken too much of this medicine contact a poison control center or emergency room at once. NOTE: This medicine is only for you. Do not share this medicine with others. What if I miss a dose? It is important not to miss your dose. Call your doctor or health care professional if you are unable to keep an appointment. What may interact with this medicine? Interactions are not expected. This list may not describe all possible interactions. Give your health care provider a list of all the medicines, herbs, non-prescription drugs, or dietary supplements you use. Also tell them if you smoke, drink alcohol, or use illegal drugs. Some items may interact with your medicine. What should I watch for while using this medicine? Your condition will be monitored  carefully while you are receiving this medicine. Visit your prescriber or health care professional for regular checks on your progress and for the needed blood tests. It is important to keep all appointments. What side effects may I notice from receiving this medicine? Side effects that you should report to your doctor or health care professional as soon as possible: -allergic reactions like skin rash, itching or hives, swelling of the face, lips, or tongue -shortness of breath, chest pain, swelling in a leg -unusual bleeding or bruising Side effects that usually do not require medical attention (report to your doctor or health care professional if they continue or are bothersome): -dizziness -headache -muscle aches -pain in arms and legs -stomach pain -trouble sleeping This list may not describe all possible side effects. Call your doctor for medical advice about side effects. You may report side effects to FDA at 1-800-FDA-1088. Where should I keep my medicine? This drug is given in a hospital or clinic and will not be stored at home. NOTE: This sheet is a summary. It may not cover all possible information. If you have questions about this medicine, talk to your doctor, pharmacist, or health care provider.    2016, Elsevier/Gold Standard. (2008-02-08 15:13:04)  

## 2016-01-26 ENCOUNTER — Ambulatory Visit: Payer: Medicare Other

## 2016-01-26 ENCOUNTER — Ambulatory Visit (HOSPITAL_BASED_OUTPATIENT_CLINIC_OR_DEPARTMENT_OTHER): Payer: Medicare Other

## 2016-01-26 ENCOUNTER — Other Ambulatory Visit (HOSPITAL_BASED_OUTPATIENT_CLINIC_OR_DEPARTMENT_OTHER): Payer: Medicare Other

## 2016-01-26 VITALS — BP 160/70 | HR 71 | Temp 98.2°F | Resp 18

## 2016-01-26 DIAGNOSIS — C9200 Acute myeloblastic leukemia, not having achieved remission: Secondary | ICD-10-CM

## 2016-01-26 DIAGNOSIS — D61818 Other pancytopenia: Secondary | ICD-10-CM

## 2016-01-26 DIAGNOSIS — D693 Immune thrombocytopenic purpura: Secondary | ICD-10-CM

## 2016-01-26 DIAGNOSIS — D696 Thrombocytopenia, unspecified: Secondary | ICD-10-CM

## 2016-01-26 LAB — CBC WITH DIFFERENTIAL/PLATELET
BASO%: 0.7 % (ref 0.0–2.0)
Basophils Absolute: 0 10*3/uL (ref 0.0–0.1)
EOS ABS: 0.1 10*3/uL (ref 0.0–0.5)
EOS%: 2.9 % (ref 0.0–7.0)
HEMATOCRIT: 36.7 % (ref 34.8–46.6)
HGB: 11.9 g/dL (ref 11.6–15.9)
LYMPH%: 28.2 % (ref 14.0–49.7)
MCH: 31.3 pg (ref 25.1–34.0)
MCHC: 32.6 g/dL (ref 31.5–36.0)
MCV: 96.2 fL (ref 79.5–101.0)
MONO#: 0.3 10*3/uL (ref 0.1–0.9)
MONO%: 6.6 % (ref 0.0–14.0)
NEUT%: 61.6 % (ref 38.4–76.8)
NEUTROS ABS: 2.3 10*3/uL (ref 1.5–6.5)
PLATELETS: 134 10*3/uL — AB (ref 145–400)
RBC: 3.81 10*6/uL (ref 3.70–5.45)
RDW: 17.1 % — ABNORMAL HIGH (ref 11.2–14.5)
WBC: 3.8 10*3/uL — AB (ref 3.9–10.3)
lymph#: 1.1 10*3/uL (ref 0.9–3.3)

## 2016-01-26 LAB — CHCC SMEAR

## 2016-01-26 MED ORDER — ROMIPLOSTIM 250 MCG ~~LOC~~ SOLR
60.0000 ug | Freq: Once | SUBCUTANEOUS | Status: AC
Start: 2016-01-26 — End: 2016-01-26
  Administered 2016-01-26: 60 ug via SUBCUTANEOUS
  Filled 2016-01-26: qty 0.12

## 2016-01-26 NOTE — Patient Instructions (Signed)
Romiplostim injection What is this medicine? ROMIPLOSTIM (roe mi PLOE stim) helps your body make more platelets. This medicine is used to treat low platelets caused by chronic idiopathic thrombocytopenic purpura (ITP). This medicine may be used for other purposes; ask your health care provider or pharmacist if you have questions. What should I tell my health care provider before I take this medicine? They need to know if you have any of these conditions: -cancer or myelodysplastic syndrome -low blood counts, like low white cell, platelet, or red cell counts -take medicines that treat or prevent blood clots -an unusual or allergic reaction to romiplostim, mannitol, other medicines, foods, dyes, or preservatives -pregnant or trying to get pregnant -breast-feeding How should I use this medicine? This medicine is for injection under the skin. It is given by a health care professional in a hospital or clinic setting. A special MedGuide will be given to you before your injection. Read this information carefully each time. Talk to your pediatrician regarding the use of this medicine in children. Special care may be needed. Overdosage: If you think you have taken too much of this medicine contact a poison control center or emergency room at once. NOTE: This medicine is only for you. Do not share this medicine with others. What if I miss a dose? It is important not to miss your dose. Call your doctor or health care professional if you are unable to keep an appointment. What may interact with this medicine? Interactions are not expected. This list may not describe all possible interactions. Give your health care provider a list of all the medicines, herbs, non-prescription drugs, or dietary supplements you use. Also tell them if you smoke, drink alcohol, or use illegal drugs. Some items may interact with your medicine. What should I watch for while using this medicine? Your condition will be monitored  carefully while you are receiving this medicine. Visit your prescriber or health care professional for regular checks on your progress and for the needed blood tests. It is important to keep all appointments. What side effects may I notice from receiving this medicine? Side effects that you should report to your doctor or health care professional as soon as possible: -allergic reactions like skin rash, itching or hives, swelling of the face, lips, or tongue -shortness of breath, chest pain, swelling in a leg -unusual bleeding or bruising Side effects that usually do not require medical attention (report to your doctor or health care professional if they continue or are bothersome): -dizziness -headache -muscle aches -pain in arms and legs -stomach pain -trouble sleeping This list may not describe all possible side effects. Call your doctor for medical advice about side effects. You may report side effects to FDA at 1-800-FDA-1088. Where should I keep my medicine? This drug is given in a hospital or clinic and will not be stored at home. NOTE: This sheet is a summary. It may not cover all possible information. If you have questions about this medicine, talk to your doctor, pharmacist, or health care provider.    2016, Elsevier/Gold Standard. (2008-02-08 15:13:04)  

## 2016-02-02 ENCOUNTER — Other Ambulatory Visit: Payer: Medicare Other

## 2016-02-02 ENCOUNTER — Ambulatory Visit (HOSPITAL_BASED_OUTPATIENT_CLINIC_OR_DEPARTMENT_OTHER): Payer: Medicare Other

## 2016-02-02 ENCOUNTER — Inpatient Hospital Stay: Payer: Medicare Other

## 2016-02-02 ENCOUNTER — Ambulatory Visit: Payer: Medicare Other

## 2016-02-02 VITALS — BP 147/61 | HR 81 | Temp 97.7°F | Resp 20

## 2016-02-02 DIAGNOSIS — D693 Immune thrombocytopenic purpura: Secondary | ICD-10-CM

## 2016-02-02 DIAGNOSIS — C92Z0 Other myeloid leukemia not having achieved remission: Secondary | ICD-10-CM

## 2016-02-02 DIAGNOSIS — C9200 Acute myeloblastic leukemia, not having achieved remission: Secondary | ICD-10-CM

## 2016-02-02 DIAGNOSIS — D61818 Other pancytopenia: Secondary | ICD-10-CM

## 2016-02-02 DIAGNOSIS — D696 Thrombocytopenia, unspecified: Secondary | ICD-10-CM

## 2016-02-02 LAB — CBC WITH DIFFERENTIAL/PLATELET
BASO%: 0.7 % (ref 0.0–2.0)
Basophils Absolute: 0 10*3/uL (ref 0.0–0.1)
EOS ABS: 0 10*3/uL (ref 0.0–0.5)
EOS%: 1.2 % (ref 0.0–7.0)
HEMATOCRIT: 37.8 % (ref 34.8–46.6)
HGB: 12.2 g/dL (ref 11.6–15.9)
LYMPH#: 1.3 10*3/uL (ref 0.9–3.3)
LYMPH%: 38 % (ref 14.0–49.7)
MCH: 31 pg (ref 25.1–34.0)
MCHC: 32.3 g/dL (ref 31.5–36.0)
MCV: 95.9 fL (ref 79.5–101.0)
MONO#: 0.1 10*3/uL (ref 0.1–0.9)
MONO%: 2.3 % (ref 0.0–14.0)
NEUT%: 57.8 % (ref 38.4–76.8)
NEUTROS ABS: 2 10*3/uL (ref 1.5–6.5)
Platelets: 207 10*3/uL (ref 145–400)
RBC: 3.94 10*6/uL (ref 3.70–5.45)
RDW: 17.9 % — ABNORMAL HIGH (ref 11.2–14.5)
WBC: 3.4 10*3/uL — AB (ref 3.9–10.3)

## 2016-02-02 LAB — CHCC SMEAR

## 2016-02-02 MED ORDER — ROMIPLOSTIM 250 MCG ~~LOC~~ SOLR
60.0000 ug | Freq: Once | SUBCUTANEOUS | Status: AC
  Administered 2016-02-02: 60 ug via SUBCUTANEOUS
  Filled 2016-02-02: qty 0.12

## 2016-02-02 NOTE — Patient Instructions (Signed)
Romiplostim injection What is this medicine? ROMIPLOSTIM (roe mi PLOE stim) helps your body make more platelets. This medicine is used to treat low platelets caused by chronic idiopathic thrombocytopenic purpura (ITP). This medicine may be used for other purposes; ask your health care provider or pharmacist if you have questions. What should I tell my health care provider before I take this medicine? They need to know if you have any of these conditions: -cancer or myelodysplastic syndrome -low blood counts, like low white cell, platelet, or red cell counts -take medicines that treat or prevent blood clots -an unusual or allergic reaction to romiplostim, mannitol, other medicines, foods, dyes, or preservatives -pregnant or trying to get pregnant -breast-feeding How should I use this medicine? This medicine is for injection under the skin. It is given by a health care professional in a hospital or clinic setting. A special MedGuide will be given to you before your injection. Read this information carefully each time. Talk to your pediatrician regarding the use of this medicine in children. Special care may be needed. Overdosage: If you think you have taken too much of this medicine contact a poison control center or emergency room at once. NOTE: This medicine is only for you. Do not share this medicine with others. What if I miss a dose? It is important not to miss your dose. Call your doctor or health care professional if you are unable to keep an appointment. What may interact with this medicine? Interactions are not expected. This list may not describe all possible interactions. Give your health care provider a list of all the medicines, herbs, non-prescription drugs, or dietary supplements you use. Also tell them if you smoke, drink alcohol, or use illegal drugs. Some items may interact with your medicine. What should I watch for while using this medicine? Your condition will be monitored  carefully while you are receiving this medicine. Visit your prescriber or health care professional for regular checks on your progress and for the needed blood tests. It is important to keep all appointments. What side effects may I notice from receiving this medicine? Side effects that you should report to your doctor or health care professional as soon as possible: -allergic reactions like skin rash, itching or hives, swelling of the face, lips, or tongue -shortness of breath, chest pain, swelling in a leg -unusual bleeding or bruising Side effects that usually do not require medical attention (report to your doctor or health care professional if they continue or are bothersome): -dizziness -headache -muscle aches -pain in arms and legs -stomach pain -trouble sleeping This list may not describe all possible side effects. Call your doctor for medical advice about side effects. You may report side effects to FDA at 1-800-FDA-1088. Where should I keep my medicine? This drug is given in a hospital or clinic and will not be stored at home. NOTE: This sheet is a summary. It may not cover all possible information. If you have questions about this medicine, talk to your doctor, pharmacist, or health care provider.    2016, Elsevier/Gold Standard. (2008-02-08 15:13:04)  

## 2016-02-04 NOTE — Progress Notes (Signed)
[place]  [phone]) [phone] [phone]) [phone]   ID: [name] DOB: 1935-08-17  MR#: [id number]  BMW#:[id number]  Patient Care Team: [facility], FNP as PCP - General (Family Medicine) PCP: [name], FNP, [name] GYN: SU:  OTHER MD: [name], [name], [name]  CHIEF COMPLAINT: Acute myeloid leukemia; ITP  CURRENT TREATMENT: Azacitidine, romiplostim  HISTORY OF PRESENT ILLNESS: From the 05/22/2025 consult note:  "The patient was evaluated at her PCP's office 05/22/2015 with a complaint of DOE worsening over several months. Labwork was obtained including a CBC, whioch showed WBC 1.8, platelets 14K, and Hb 5.9 with an MCV of 97.5. DDimer was 1.04. Accordingly the patient was referred to the ED and was admitted 05/23/2015.   Labwork since admission includes a repeat CBC with WBC 1.7, HB 5.4, MCV 100.6 and platelets 10K. Creat was 1.04 with GFR 50. Reticulocytes are not elevated with abs 39.3 (1.7%) and LDH is normal at 188. Other labs are pending.  The patient tells me because of peripheral neuropathy she has been receiving monthly B-12 shots since 2005."  Bone marrow biopsy 05/25/2015 showed acute myeloid leukemia, with 17% blasts by flow cytometry, 24% by aspirate counts, and 20-30% by immunohistochemistry ([id number], and [id number]). The blasts were positive for CD 117 and focally for MPO. There were also myelodysplasia related changes in all 3 cell lines.  The patient's subsequent history is as detailed below.  INTERVAL HISTORY: [name] returns today for follow-up of her myelodysplasia/acute myeloid leukemia. Today is day 1 cycle 9 of azacitidine, given for 7 doses (with an intervening weekend) every 28 days. She also receives romiplostim as needed, with lab tests every Friday.  REVIEW OF SYSTEMS: [name] continues to do remarkably well. She has no symptoms related to her myelodysplasia or its treatment that she is aware  of. Her big concern is that her 78 year old husband has been readmitted again with significant heart issues. This is a second admission in the last 30 days a second problem is that their children sold the family beach house and they have brought a lot of furniture to her home which is not fluttering everything. This is causing some anxiety. Aside from those issues she complains that her appetite is poor, she has a little bit of a cough when she lies down at night, but not otherwise, she has stress urinary incontinence, easy bruising, arthritis pains here and there, and a little bit of forgetfulness and anxiety. None of these are new problems and none of them are significantly more intense than before. A detailed review of systems otherwise was stable  PAST MEDICAL HISTORY: Past Medical History:  Diagnosis Date  . Anemia   . Anxiety   . Arthritis    "hand joints; hips; back" (05/23/2015)  . Chronic lower back pain   . Depression   . GERD (gastroesophageal reflux disease)   . History of blood transfusion 05/23/2015   "Hgb 5.9"  . Hypertension   . Shortness of breath dyspnea    with exertion    PAST SURGICAL HISTORY: Past Surgical History:  Procedure Laterality Date  . Bone spur     R thigh  . CATARACT EXTRACTION W/ INTRAOCULAR LENS  IMPLANT, BILATERAL  ~ 2005  . DILATION AND CURETTAGE OF UTERUS    . ESOPHAGOGASTRODUODENOSCOPY (EGD) WITH PROPOFOL N/A 11/08/2014   Procedure: ESOPHAGOGASTRODUODENOSCOPY (EGD) WITH PROPOFOL;  Surgeon: Garlan Fair, MD;  Location: [facility];  Service: Endoscopy;  Laterality: N/A;  .  JOINT REPLACEMENT    . TONSILLECTOMY  ~ 1950  . TOTAL KNEE ARTHROPLASTY  04/27/2012   Procedure: TOTAL KNEE ARTHROPLASTY;  Surgeon: Mauri Pole, MD;  Location: [facility];  Service: Orthopedics;  Laterality: Right;  . TOTAL SHOULDER ARTHROPLASTY  09/13/2011   Procedure: TOTAL SHOULDER ARTHROPLASTY;  Surgeon: Augustin Schooling, MD;  Location: [facility];  Service: Orthopedics;   Laterality: Left;  LEFT SHOULDER REVERSED TOTAL SHOULDER ARTHROPLASTY    FAMILY HISTORY Family History  Problem Relation Age of Onset  . Heart attack Mother 60    Died age 57  . Heart disease Brother     Atrial fib  Patient's father died age 61 with prostate cancer; mother died atv31 with an MI. One brother, alive at 34; one sister with parkinson's. No blood or cancer problems otherwise in family  GYNECOLOGIC HISTORY:  No LMP recorded. Patient is postmenopausal. Menarche age 85, first live birth age 14, she is GXP3; menopause age 60, no HR  SOCIAL HISTORY:  Grade school Pharmacist, hospital, retired; husband was a Higher education careers adviser. He has severe heart disease and she is his main caretaker. Son [name] works for [employer] and sings in the [employer]; daughter [name] lives in [place], a homemaker; daughter [name] works at the surgical center in [employer] as an Therapist, sports. The patient has 7 gch. She is a Psychologist, forensic    ADVANCED DIRECTIVES: in place; the patient's son [name] is her [place]: Social History  Substance Use Topics  . Smoking status: Never Smoker  . Smokeless tobacco: Never Used  . Alcohol use No     Colonoscopy:  PAP:  Bone density:  Lipid panel:  Allergies  Allergen Reactions  . Cozaar [Losartan] Other (See Comments)    Cause pt to lose her taste for foods  . Codeine Itching and Other (See Comments)    Makes feel crazy  PT STATES SHE ALSO CAN NOT TAKE THE SYNTHETIC CODEINES  . Hydrocodone Itching    Tolerates with benadryl  . Oxycodone Itching  . Tetanus Toxoids Swelling and Rash    Current Outpatient Prescriptions  Medication Sig Dispense Refill  . amLODipine (NORVASC) 2.5 MG tablet TAKE ONE TABLET BY MOUTH ONCE DAILY 90 tablet 5  . Cyanocobalamin 1000 MCG/ML KIT Inject 1,000 mcg as directed every 30 (thirty) days. B 12 shot 10 kit 1  . hydrochlorothiazide (HYDRODIURIL) 25 MG tablet TAKE ONE TABLET BY MOUTH ONCE DAILY 90 tablet 0  . Melatonin 5 MG TABS Take 2.5 mg  by mouth at bedtime.    . Multiple Vitamin (MULTIVITAMIN WITH MINERALS) TABS tablet Take 1 tablet by mouth daily.    . pantoprazole (PROTONIX) 40 MG tablet Take 40 mg by mouth every morning.    . valACYclovir (VALTREX) 1000 MG tablet Take 1,000 mg by mouth 2 (two) times daily.     . Venlafaxine HCl 150 MG TB24 TAKE ONE TABLET BY MOUTH ONCE DAILY 30 tablet 5  . Vitamin D, Ergocalciferol, (DRISDOL) 50000 units CAPS capsule Take 1 capsule (50,000 Units total) by mouth See admin instructions. Every other week. 30 capsule 1   No current facility-administered medications for this visit.     OBJECTIVE: Older white woman in no acute distress  Vitals:   02/05/16 1120  BP: 134/60  Pulse: 74  Resp: 18  Temp: 98.4 F (36.9 C)     Body mass index is 21.5 kg/m.    ECOG FS:1 - Symptomatic but completely ambulatory  Sclerae unicteric,  pupils round and equal Oropharynx clear and moist-- no thrush or other lesions No cervical or supraclavicular adenopathy Lungs no rales or rhonchi Heart regular rate and rhythm Abd soft, nontender, positive bowel sounds, no palpable splenomegaly  MSK no focal spinal tenderness, walks with a cane Neuro: nonfocal, well oriented, appropriate affect Breasts: Deferred   LAB RESULTS:  CMP     Component Value Date/Time   NA 140 01/08/2016 1034   K 3.6 01/08/2016 1034   CL 104 05/25/2015 0332   CO2 28 01/08/2016 1034   GLUCOSE 101 01/08/2016 1034   BUN 13.2 01/08/2016 1034   CREATININE 0.9 01/08/2016 1034   CALCIUM 9.2 01/08/2016 1034   PROT 6.6 01/08/2016 1034   ALBUMIN 3.5 01/08/2016 1034   AST 18 01/08/2016 1034   ALT 14 01/08/2016 1034   ALKPHOS 69 01/08/2016 1034   BILITOT 0.53 01/08/2016 1034   GFRNONAA 58 (L) 05/25/2015 0332   GFRAA >60 05/25/2015 0332    INo results found for: SPEP, UPEP  Lab Results  Component Value Date   WBC 2.9 (L) 02/05/2016   NEUTROABS 1.7 02/05/2016   HGB 12.7 02/05/2016   HCT 39.0 02/05/2016   MCV 95.4 02/05/2016     PLT 271 02/05/2016      Chemistry      Component Value Date/Time   NA 140 01/08/2016 1034   K 3.6 01/08/2016 1034   CL 104 05/25/2015 0332   CO2 28 01/08/2016 1034   BUN 13.2 01/08/2016 1034   CREATININE 0.9 01/08/2016 1034      Component Value Date/Time   CALCIUM 9.2 01/08/2016 1034   ALKPHOS 69 01/08/2016 1034   AST 18 01/08/2016 1034   ALT 14 01/08/2016 1034   BILITOT 0.53 01/08/2016 1034       No results found for: LABCA2  No components found for: LABCA125  No results for input(s): INR in the last 168 hours.  Urinalysis    Component Value Date/Time   COLORURINE YELLOW 04/21/2012 0946   APPEARANCEUR CLOUDY (A) 04/21/2012 0946   LABSPEC 1.019 04/21/2012 0946   PHURINE 7.0 04/21/2012 0946   GLUCOSEU NEGATIVE 04/21/2012 0946   HGBUR NEGATIVE 04/21/2012 0946   BILIRUBINUR NEGATIVE 04/21/2012 0946   KETONESUR NEGATIVE 04/21/2012 0946   PROTEINUR NEGATIVE 04/21/2012 0946   UROBILINOGEN 1.0 04/21/2012 0946   NITRITE NEGATIVE 04/21/2012 0946   LEUKOCYTESUR NEGATIVE 04/21/2012 0946    STUDIES: No results found. .  ASSESSMENT: 80 y.o. [place] woman with acute myeloid leukemia diagnosed by bone marrow biopsy 05/25/2015, with the blast count between 17 and 30% depending on the method of determination.   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, with gain of 2 and 19  (b) consider allogenic transplant  (c) all transfusion products to be irradiated  (1) started sQ azacitidine 06/05/2015, receiving 7 doses every 28 day cycle  (2) immune thrombocytopenic purpura  (a) s/p IVIG 07/06/2015 (1g/kg x 2d) with excellent response  (b) romiplostim first dose 07/07/2015, started weekly as of 08/04/2015  (3) biopsy of an enlarged right axillary lymph node at [facility] 11/13/2015 was nondiagnostic.  PLAN:  Roman is proceeding to her ninth cycle of azacitidine today. In general she has had a remarkable response, and in particular her severe  thrombocytopenia resolved with romiplostim, which we are continuing as needed. She wanted to know whether she was in remission and I said from my point of view she certainly S, although we have scheduled her  for a bone marrow biopsy 02/27/2016 and that will tell us in more detail what is actually going on  She already has an appointment with Dr. Nadara Mustard for 03/07/2016. Accordingly we are going to hold off on her 10th cycle of azacitabine until September 18, so we can have the benefit of Dr. Lyman Speller recommendations after she reviews the bone marrow results  Keela knows to call us for any fever or bleeding issues, or any other problems that may develop before her next visit here.   [name], MD   02/05/2016 11:33 AM

## 2016-02-05 ENCOUNTER — Other Ambulatory Visit (HOSPITAL_BASED_OUTPATIENT_CLINIC_OR_DEPARTMENT_OTHER): Payer: Medicare Other

## 2016-02-05 ENCOUNTER — Ambulatory Visit (HOSPITAL_BASED_OUTPATIENT_CLINIC_OR_DEPARTMENT_OTHER): Payer: Medicare Other | Admitting: Oncology

## 2016-02-05 ENCOUNTER — Ambulatory Visit (HOSPITAL_BASED_OUTPATIENT_CLINIC_OR_DEPARTMENT_OTHER): Payer: Medicare Other

## 2016-02-05 ENCOUNTER — Telehealth: Payer: Self-pay | Admitting: Oncology

## 2016-02-05 ENCOUNTER — Ambulatory Visit: Payer: Medicare Other

## 2016-02-05 VITALS — BP 134/60 | HR 74 | Temp 98.4°F | Resp 18 | Ht 68.0 in | Wt 141.4 lb

## 2016-02-05 DIAGNOSIS — C92Z0 Other myeloid leukemia not having achieved remission: Secondary | ICD-10-CM | POA: Diagnosis not present

## 2016-02-05 DIAGNOSIS — D61818 Other pancytopenia: Secondary | ICD-10-CM

## 2016-02-05 DIAGNOSIS — Z5111 Encounter for antineoplastic chemotherapy: Secondary | ICD-10-CM | POA: Diagnosis not present

## 2016-02-05 DIAGNOSIS — C9201 Acute myeloblastic leukemia, in remission: Secondary | ICD-10-CM

## 2016-02-05 DIAGNOSIS — C9200 Acute myeloblastic leukemia, not having achieved remission: Secondary | ICD-10-CM

## 2016-02-05 DIAGNOSIS — D693 Immune thrombocytopenic purpura: Secondary | ICD-10-CM | POA: Diagnosis not present

## 2016-02-05 LAB — COMPREHENSIVE METABOLIC PANEL
ALBUMIN: 3.8 g/dL (ref 3.5–5.0)
ALK PHOS: 70 U/L (ref 40–150)
ALT: 15 U/L (ref 0–55)
AST: 21 U/L (ref 5–34)
Anion Gap: 10 mEq/L (ref 3–11)
BILIRUBIN TOTAL: 0.87 mg/dL (ref 0.20–1.20)
BUN: 12.1 mg/dL (ref 7.0–26.0)
CALCIUM: 9.7 mg/dL (ref 8.4–10.4)
CO2: 27 mEq/L (ref 22–29)
CREATININE: 0.9 mg/dL (ref 0.6–1.1)
Chloride: 102 mEq/L (ref 98–109)
EGFR: 60 mL/min/{1.73_m2} — ABNORMAL LOW (ref >90)
GLUCOSE: 94 mg/dL (ref 70–140)
POTASSIUM: 3.7 meq/L (ref 3.5–5.1)
SODIUM: 138 meq/L (ref 136–145)
TOTAL PROTEIN: 6.9 g/dL (ref 6.4–8.3)

## 2016-02-05 LAB — CBC WITH DIFFERENTIAL/PLATELET
BASO%: 1.2 % (ref 0.0–2.0)
BASOS ABS: 0 10*3/uL (ref 0.0–0.1)
EOS%: 2.3 % (ref 0.0–7.0)
Eosinophils Absolute: 0.1 10*3/uL (ref 0.0–0.5)
HEMATOCRIT: 39 % (ref 34.8–46.6)
HEMOGLOBIN: 12.7 g/dL (ref 11.6–15.9)
LYMPH#: 1 10*3/uL (ref 0.9–3.3)
LYMPH%: 35.1 % (ref 14.0–49.7)
MCH: 31.1 pg (ref 25.1–34.0)
MCHC: 32.6 g/dL (ref 31.5–36.0)
MCV: 95.4 fL (ref 79.5–101.0)
MONO#: 0.1 10*3/uL (ref 0.1–0.9)
MONO%: 4.1 % (ref 0.0–14.0)
NEUT#: 1.7 10*3/uL (ref 1.5–6.5)
NEUT%: 57.3 % (ref 38.4–76.8)
PLATELETS: 271 10*3/uL (ref 145–400)
RBC: 4.09 10*6/uL (ref 3.70–5.45)
RDW: 17.4 % — AB (ref 11.2–14.5)
WBC: 2.9 10*3/uL — ABNORMAL LOW (ref 3.9–10.3)

## 2016-02-05 LAB — CHCC SMEAR

## 2016-02-05 MED ORDER — ONDANSETRON HCL 8 MG PO TABS
ORAL_TABLET | ORAL | Status: AC
  Filled 2016-02-05: qty 1

## 2016-02-05 MED ORDER — AZACITIDINE CHEMO SQ INJECTION
75.0000 mg/m2 | Freq: Once | INTRAMUSCULAR | Status: AC
  Administered 2016-02-05: 140 mg via SUBCUTANEOUS
  Filled 2016-02-05: qty 5.6

## 2016-02-05 MED ORDER — ONDANSETRON HCL 8 MG PO TABS
8.0000 mg | ORAL_TABLET | Freq: Once | ORAL | Status: AC
  Administered 2016-02-05: 8 mg via ORAL

## 2016-02-05 NOTE — Telephone Encounter (Signed)
appt made and avs printed °

## 2016-02-05 NOTE — Patient Instructions (Signed)
[facility]charge [facility] for [facility] Receiving [facility]otherapy  Today you received the following chemotherapy agents Vidaza  To help prevent nausea and vomiting after your treatment, we encourage you to take your nausea medication   If you develop nausea and vomiting that is not controlled by your nausea medication, call the clinic.   BELOW ARE SYMPTOMS THAT SHOULD BE REPORTED IMMEDIATELY:  *FEVER GREATER THAN 100.5 F  *CHILLS WITH OR WITHOUT FEVER  NAUSEA AND VOMITING THAT IS NOT CONTROLLED WITH YOUR NAUSEA MEDICATION  *UNUSUAL SHORTNESS OF BREATH  *UNUSUAL BRUISING OR BLEEDING  TENDERNESS IN MOUTH AND THROAT WITH OR WITHOUT PRESENCE OF ULCERS  *URINARY PROBLEMS  *BOWEL PROBLEMS  UNUSUAL RASH Items with * indicate a potential emergency and should be followed up as soon as possible.  Feel free to call the clinic you have any questions or concerns. The clinic phone number is [phone].  Please show the CHEMO ALERT CARD at check-in to the Emergency Department and triage nurse.   

## 2016-02-06 ENCOUNTER — Ambulatory Visit: Payer: Medicare Other

## 2016-02-06 ENCOUNTER — Ambulatory Visit (HOSPITAL_BASED_OUTPATIENT_CLINIC_OR_DEPARTMENT_OTHER): Payer: Medicare Other

## 2016-02-06 VITALS — BP 143/68 | HR 71 | Temp 97.7°F | Resp 18

## 2016-02-06 DIAGNOSIS — C92Z0 Other myeloid leukemia not having achieved remission: Secondary | ICD-10-CM | POA: Diagnosis not present

## 2016-02-06 DIAGNOSIS — C9200 Acute myeloblastic leukemia, not having achieved remission: Secondary | ICD-10-CM

## 2016-02-06 DIAGNOSIS — D61818 Other pancytopenia: Secondary | ICD-10-CM

## 2016-02-06 DIAGNOSIS — Z5111 Encounter for antineoplastic chemotherapy: Secondary | ICD-10-CM | POA: Diagnosis not present

## 2016-02-06 MED ORDER — ONDANSETRON HCL 8 MG PO TABS
ORAL_TABLET | ORAL | Status: AC
  Filled 2016-02-06: qty 1

## 2016-02-06 MED ORDER — ONDANSETRON HCL 8 MG PO TABS
8.0000 mg | ORAL_TABLET | Freq: Once | ORAL | Status: AC
  Administered 2016-02-06: 8 mg via ORAL

## 2016-02-06 MED ORDER — AZACITIDINE CHEMO SQ INJECTION
75.0000 mg/m2 | Freq: Once | INTRAMUSCULAR | Status: AC
  Administered 2016-02-06: 140 mg via SUBCUTANEOUS
  Filled 2016-02-06: qty 5.6

## 2016-02-06 NOTE — Patient Instructions (Signed)
[facility] Discharge Instructions for Patients Receiving Chemotherapy  Today you received the following chemotherapy agents: Vidaza   To help prevent nausea and vomiting after your treatment, we encourage you to take your nausea medication as directed.    If you develop nausea and vomiting that is not controlled by your nausea medication, call the clinic.   BELOW ARE SYMPTOMS THAT SHOULD BE REPORTED IMMEDIATELY:  *FEVER GREATER THAN 100.5 F  *CHILLS WITH OR WITHOUT FEVER  NAUSEA AND VOMITING THAT IS NOT CONTROLLED WITH YOUR NAUSEA MEDICATION  *UNUSUAL SHORTNESS OF BREATH  *UNUSUAL BRUISING OR BLEEDING  TENDERNESS IN MOUTH AND THROAT WITH OR WITHOUT PRESENCE OF ULCERS  *URINARY PROBLEMS  *BOWEL PROBLEMS  UNUSUAL RASH Items with * indicate a potential emergency and should be followed up as soon as possible.  Feel free to call the clinic you have any questions or concerns. The clinic phone number is [phone].  Please show the CHEMO ALERT CARD at check-in to the Emergency Department and triage nurse.   

## 2016-02-07 ENCOUNTER — Ambulatory Visit (HOSPITAL_BASED_OUTPATIENT_CLINIC_OR_DEPARTMENT_OTHER): Payer: Medicare Other

## 2016-02-07 ENCOUNTER — Ambulatory Visit: Payer: Medicare Other

## 2016-02-07 VITALS — BP 162/85 | HR 74 | Temp 98.0°F | Resp 20

## 2016-02-07 DIAGNOSIS — C92Z0 Other myeloid leukemia not having achieved remission: Secondary | ICD-10-CM

## 2016-02-07 DIAGNOSIS — Z5111 Encounter for antineoplastic chemotherapy: Secondary | ICD-10-CM

## 2016-02-07 DIAGNOSIS — D61818 Other pancytopenia: Secondary | ICD-10-CM

## 2016-02-07 DIAGNOSIS — C9200 Acute myeloblastic leukemia, not having achieved remission: Secondary | ICD-10-CM

## 2016-02-07 MED ORDER — AZACITIDINE CHEMO SQ INJECTION
75.0000 mg/m2 | Freq: Once | INTRAMUSCULAR | Status: AC
  Administered 2016-02-07: 140 mg via SUBCUTANEOUS
  Filled 2016-02-07: qty 5.6

## 2016-02-07 MED ORDER — ONDANSETRON HCL 8 MG PO TABS
8.0000 mg | ORAL_TABLET | Freq: Once | ORAL | Status: AC
  Administered 2016-02-07: 8 mg via ORAL

## 2016-02-07 MED ORDER — ONDANSETRON HCL 8 MG PO TABS
ORAL_TABLET | ORAL | Status: AC
  Filled 2016-02-07: qty 1

## 2016-02-07 NOTE — Patient Instructions (Signed)
[facility] Discharge Instructions for Patients Receiving Chemotherapy  Today you received the following chemotherapy agents: Vidaza   To help prevent nausea and vomiting after your treatment, we encourage you to take your nausea medication as directed.    If you develop nausea and vomiting that is not controlled by your nausea medication, call the clinic.   BELOW ARE SYMPTOMS THAT SHOULD BE REPORTED IMMEDIATELY:  *FEVER GREATER THAN 100.5 F  *CHILLS WITH OR WITHOUT FEVER  NAUSEA AND VOMITING THAT IS NOT CONTROLLED WITH YOUR NAUSEA MEDICATION  *UNUSUAL SHORTNESS OF BREATH  *UNUSUAL BRUISING OR BLEEDING  TENDERNESS IN MOUTH AND THROAT WITH OR WITHOUT PRESENCE OF ULCERS  *URINARY PROBLEMS  *BOWEL PROBLEMS  UNUSUAL RASH Items with * indicate a potential emergency and should be followed up as soon as possible.  Feel free to call the clinic you have any questions or concerns. The clinic phone number is [phone].  Please show the CHEMO ALERT CARD at check-in to the Emergency Department and triage nurse.   

## 2016-02-08 ENCOUNTER — Ambulatory Visit (HOSPITAL_BASED_OUTPATIENT_CLINIC_OR_DEPARTMENT_OTHER): Payer: Medicare Other

## 2016-02-08 ENCOUNTER — Ambulatory Visit: Payer: Medicare Other

## 2016-02-08 VITALS — BP 148/72 | HR 77 | Temp 97.9°F | Resp 18

## 2016-02-08 DIAGNOSIS — D61818 Other pancytopenia: Secondary | ICD-10-CM

## 2016-02-08 DIAGNOSIS — C92Z0 Other myeloid leukemia not having achieved remission: Secondary | ICD-10-CM | POA: Diagnosis not present

## 2016-02-08 DIAGNOSIS — Z5111 Encounter for antineoplastic chemotherapy: Secondary | ICD-10-CM

## 2016-02-08 DIAGNOSIS — C9200 Acute myeloblastic leukemia, not having achieved remission: Secondary | ICD-10-CM

## 2016-02-08 MED ORDER — ONDANSETRON HCL 8 MG PO TABS
8.0000 mg | ORAL_TABLET | Freq: Once | ORAL | Status: AC
  Administered 2016-02-08: 8 mg via ORAL

## 2016-02-08 MED ORDER — AZACITIDINE CHEMO SQ INJECTION
75.0000 mg/m2 | Freq: Once | INTRAMUSCULAR | Status: AC
  Administered 2016-02-08: 140 mg via SUBCUTANEOUS
  Filled 2016-02-08: qty 5.6

## 2016-02-08 MED ORDER — ONDANSETRON HCL 8 MG PO TABS
ORAL_TABLET | ORAL | Status: AC
  Filled 2016-02-08: qty 1

## 2016-02-08 NOTE — Patient Instructions (Signed)
[facility] Discharge Instructions for Patients Receiving Chemotherapy  Today you received the following chemotherapy agents: Vidaza   To help prevent nausea and vomiting after your treatment, we encourage you to take your nausea medication as directed.    If you develop nausea and vomiting that is not controlled by your nausea medication, call the clinic.   BELOW ARE SYMPTOMS THAT SHOULD BE REPORTED IMMEDIATELY:  *FEVER GREATER THAN 100.5 F  *CHILLS WITH OR WITHOUT FEVER  NAUSEA AND VOMITING THAT IS NOT CONTROLLED WITH YOUR NAUSEA MEDICATION  *UNUSUAL SHORTNESS OF BREATH  *UNUSUAL BRUISING OR BLEEDING  TENDERNESS IN MOUTH AND THROAT WITH OR WITHOUT PRESENCE OF ULCERS  *URINARY PROBLEMS  *BOWEL PROBLEMS  UNUSUAL RASH Items with * indicate a potential emergency and should be followed up as soon as possible.  Feel free to call the clinic you have any questions or concerns. The clinic phone number is [phone].  Please show the CHEMO ALERT CARD at check-in to the Emergency Department and triage nurse.   

## 2016-02-09 ENCOUNTER — Ambulatory Visit (HOSPITAL_BASED_OUTPATIENT_CLINIC_OR_DEPARTMENT_OTHER): Payer: Medicare Other

## 2016-02-09 ENCOUNTER — Other Ambulatory Visit (HOSPITAL_BASED_OUTPATIENT_CLINIC_OR_DEPARTMENT_OTHER): Payer: Medicare Other

## 2016-02-09 ENCOUNTER — Ambulatory Visit: Payer: Medicare Other

## 2016-02-09 ENCOUNTER — Other Ambulatory Visit: Payer: Self-pay | Admitting: Oncology

## 2016-02-09 VITALS — BP 141/71 | HR 73 | Temp 98.1°F | Resp 17

## 2016-02-09 DIAGNOSIS — C92Z0 Other myeloid leukemia not having achieved remission: Secondary | ICD-10-CM

## 2016-02-09 DIAGNOSIS — D61818 Other pancytopenia: Secondary | ICD-10-CM

## 2016-02-09 DIAGNOSIS — Z5111 Encounter for antineoplastic chemotherapy: Secondary | ICD-10-CM

## 2016-02-09 DIAGNOSIS — C9200 Acute myeloblastic leukemia, not having achieved remission: Secondary | ICD-10-CM

## 2016-02-09 DIAGNOSIS — D693 Immune thrombocytopenic purpura: Secondary | ICD-10-CM

## 2016-02-09 DIAGNOSIS — D696 Thrombocytopenia, unspecified: Secondary | ICD-10-CM

## 2016-02-09 LAB — CBC WITH DIFFERENTIAL/PLATELET
BASO%: 2 % (ref 0.0–2.0)
Basophils Absolute: 0.1 10*3/uL (ref 0.0–0.1)
EOS ABS: 0.1 10*3/uL (ref 0.0–0.5)
EOS%: 3.1 % (ref 0.0–7.0)
HEMATOCRIT: 36.1 % (ref 34.8–46.6)
HGB: 11.9 g/dL (ref 11.6–15.9)
LYMPH#: 0.9 10*3/uL (ref 0.9–3.3)
LYMPH%: 35.7 % (ref 14.0–49.7)
MCH: 31.6 pg (ref 25.1–34.0)
MCHC: 32.9 g/dL (ref 31.5–36.0)
MCV: 95.8 fL (ref 79.5–101.0)
MONO#: 0.2 10*3/uL (ref 0.1–0.9)
MONO%: 7.6 % (ref 0.0–14.0)
NEUT%: 51.6 % (ref 38.4–76.8)
NEUTROS ABS: 1.3 10*3/uL — AB (ref 1.5–6.5)
PLATELETS: 306 10*3/uL (ref 145–400)
RBC: 3.76 10*6/uL (ref 3.70–5.45)
RDW: 17 % — ABNORMAL HIGH (ref 11.2–14.5)
WBC: 2.5 10*3/uL — ABNORMAL LOW (ref 3.9–10.3)

## 2016-02-09 LAB — CHCC SMEAR

## 2016-02-09 MED ORDER — AZACITIDINE CHEMO SQ INJECTION
75.0000 mg/m2 | Freq: Once | INTRAMUSCULAR | Status: AC
  Administered 2016-02-09: 140 mg via SUBCUTANEOUS
  Filled 2016-02-09: qty 5.6

## 2016-02-09 MED ORDER — ROMIPLOSTIM 250 MCG ~~LOC~~ SOLR
60.0000 ug | Freq: Once | SUBCUTANEOUS | Status: AC
Start: 2016-02-09 — End: 2016-02-09
  Administered 2016-02-09: 60 ug via SUBCUTANEOUS
  Filled 2016-02-09: qty 0.12

## 2016-02-09 MED ORDER — ONDANSETRON HCL 8 MG PO TABS
8.0000 mg | ORAL_TABLET | Freq: Once | ORAL | Status: AC
  Administered 2016-02-09: 8 mg via ORAL

## 2016-02-09 MED ORDER — ONDANSETRON HCL 8 MG PO TABS
ORAL_TABLET | ORAL | Status: AC
  Filled 2016-02-09: qty 1

## 2016-02-12 ENCOUNTER — Ambulatory Visit (HOSPITAL_BASED_OUTPATIENT_CLINIC_OR_DEPARTMENT_OTHER): Payer: Medicare Other

## 2016-02-12 ENCOUNTER — Ambulatory Visit: Payer: Medicare Other

## 2016-02-12 VITALS — BP 146/76 | HR 68 | Temp 97.6°F | Resp 16

## 2016-02-12 DIAGNOSIS — C92Z0 Other myeloid leukemia not having achieved remission: Secondary | ICD-10-CM | POA: Diagnosis not present

## 2016-02-12 DIAGNOSIS — C9200 Acute myeloblastic leukemia, not having achieved remission: Secondary | ICD-10-CM

## 2016-02-12 DIAGNOSIS — Z5111 Encounter for antineoplastic chemotherapy: Secondary | ICD-10-CM | POA: Diagnosis not present

## 2016-02-12 DIAGNOSIS — D61818 Other pancytopenia: Secondary | ICD-10-CM

## 2016-02-12 MED ORDER — ONDANSETRON HCL 8 MG PO TABS
ORAL_TABLET | ORAL | Status: AC
  Filled 2016-02-12: qty 1

## 2016-02-12 MED ORDER — AZACITIDINE CHEMO SQ INJECTION
75.0000 mg/m2 | Freq: Once | INTRAMUSCULAR | Status: AC
  Administered 2016-02-12: 140 mg via SUBCUTANEOUS
  Filled 2016-02-12: qty 5.6

## 2016-02-12 MED ORDER — ONDANSETRON HCL 8 MG PO TABS
8.0000 mg | ORAL_TABLET | Freq: Once | ORAL | Status: AC
  Administered 2016-02-12: 8 mg via ORAL

## 2016-02-13 ENCOUNTER — Ambulatory Visit: Payer: Medicare Other

## 2016-02-13 ENCOUNTER — Encounter: Payer: Self-pay | Admitting: Oncology

## 2016-02-13 ENCOUNTER — Ambulatory Visit (HOSPITAL_BASED_OUTPATIENT_CLINIC_OR_DEPARTMENT_OTHER): Payer: Medicare Other

## 2016-02-13 VITALS — BP 156/74 | HR 71 | Temp 98.1°F | Resp 16

## 2016-02-13 DIAGNOSIS — Z5111 Encounter for antineoplastic chemotherapy: Secondary | ICD-10-CM | POA: Diagnosis not present

## 2016-02-13 DIAGNOSIS — C92Z0 Other myeloid leukemia not having achieved remission: Secondary | ICD-10-CM | POA: Diagnosis not present

## 2016-02-13 DIAGNOSIS — C9200 Acute myeloblastic leukemia, not having achieved remission: Secondary | ICD-10-CM

## 2016-02-13 DIAGNOSIS — D61818 Other pancytopenia: Secondary | ICD-10-CM

## 2016-02-13 LAB — HM MAMMOGRAPHY

## 2016-02-13 MED ORDER — ONDANSETRON HCL 8 MG PO TABS
8.0000 mg | ORAL_TABLET | Freq: Once | ORAL | Status: AC
  Administered 2016-02-13: 8 mg via ORAL

## 2016-02-13 MED ORDER — AZACITIDINE CHEMO SQ INJECTION
75.0000 mg/m2 | Freq: Once | INTRAMUSCULAR | Status: AC
  Administered 2016-02-13: 140 mg via SUBCUTANEOUS
  Filled 2016-02-13: qty 5.6

## 2016-02-13 MED ORDER — ONDANSETRON HCL 8 MG PO TABS
ORAL_TABLET | ORAL | Status: AC
  Filled 2016-02-13: qty 1

## 2016-02-13 NOTE — Progress Notes (Signed)
RN called Darlena @ [phone] to inquire about authorization, left message.

## 2016-02-16 ENCOUNTER — Other Ambulatory Visit (HOSPITAL_BASED_OUTPATIENT_CLINIC_OR_DEPARTMENT_OTHER): Payer: Medicare Other

## 2016-02-16 ENCOUNTER — Ambulatory Visit (HOSPITAL_BASED_OUTPATIENT_CLINIC_OR_DEPARTMENT_OTHER): Payer: Medicare Other

## 2016-02-16 ENCOUNTER — Ambulatory Visit: Payer: Medicare Other

## 2016-02-16 VITALS — BP 127/62 | HR 78 | Temp 98.7°F | Resp 17

## 2016-02-16 DIAGNOSIS — C92Z0 Other myeloid leukemia not having achieved remission: Secondary | ICD-10-CM | POA: Diagnosis not present

## 2016-02-16 DIAGNOSIS — C9200 Acute myeloblastic leukemia, not having achieved remission: Secondary | ICD-10-CM

## 2016-02-16 DIAGNOSIS — D696 Thrombocytopenia, unspecified: Secondary | ICD-10-CM

## 2016-02-16 DIAGNOSIS — D693 Immune thrombocytopenic purpura: Secondary | ICD-10-CM

## 2016-02-16 DIAGNOSIS — D61818 Other pancytopenia: Secondary | ICD-10-CM

## 2016-02-16 LAB — CBC WITH DIFFERENTIAL/PLATELET
BASO%: 0.2 % (ref 0.0–2.0)
Basophils Absolute: 0 10*3/uL (ref 0.0–0.1)
EOS ABS: 0.1 10*3/uL (ref 0.0–0.5)
EOS%: 1.7 % (ref 0.0–7.0)
HCT: 35.6 % (ref 34.8–46.6)
HGB: 12.2 g/dL (ref 11.6–15.9)
LYMPH%: 19.8 % (ref 14.0–49.7)
MCH: 31.9 pg (ref 25.1–34.0)
MCHC: 34.3 g/dL (ref 31.5–36.0)
MCV: 93 fL (ref 79.5–101.0)
MONO#: 0.4 10*3/uL (ref 0.1–0.9)
MONO%: 7 % (ref 0.0–14.0)
NEUT#: 4.1 10*3/uL (ref 1.5–6.5)
NEUT%: 71.3 % (ref 38.4–76.8)
PLATELETS: 207 10*3/uL (ref 145–400)
RBC: 3.83 10*6/uL (ref 3.70–5.45)
RDW: 15.7 % — ABNORMAL HIGH (ref 11.2–14.5)
WBC: 5.8 10*3/uL (ref 3.9–10.3)
lymph#: 1.1 10*3/uL (ref 0.9–3.3)

## 2016-02-16 LAB — CHCC SMEAR

## 2016-02-16 MED ORDER — ROMIPLOSTIM 250 MCG ~~LOC~~ SOLR
60.0000 ug | Freq: Once | SUBCUTANEOUS | Status: AC
  Administered 2016-02-16: 60 ug via SUBCUTANEOUS
  Filled 2016-02-16: qty 0.12

## 2016-02-16 NOTE — Patient Instructions (Signed)
Romiplostim injection What is this medicine? ROMIPLOSTIM (roe mi PLOE stim) helps your body make more platelets. This medicine is used to treat low platelets caused by chronic idiopathic thrombocytopenic purpura (ITP). This medicine may be used for other purposes; ask your health care provider or pharmacist if you have questions. What should I tell my health care provider before I take this medicine? They need to know if you have any of these conditions: -cancer or myelodysplastic syndrome -low blood counts, like low white cell, platelet, or red cell counts -take medicines that treat or prevent blood clots -an unusual or allergic reaction to romiplostim, mannitol, other medicines, foods, dyes, or preservatives -pregnant or trying to get pregnant -breast-feeding How should I use this medicine? This medicine is for injection under the skin. It is given by a health care professional in a hospital or clinic setting. A special MedGuide will be given to you before your injection. Read this information carefully each time. Talk to your pediatrician regarding the use of this medicine in children. Special care may be needed. Overdosage: If you think you have taken too much of this medicine contact a poison control center or emergency room at once. NOTE: This medicine is only for you. Do not share this medicine with others. What if I miss a dose? It is important not to miss your dose. Call your doctor or health care professional if you are unable to keep an appointment. What may interact with this medicine? Interactions are not expected. This list may not describe all possible interactions. Give your health care provider a list of all the medicines, herbs, non-prescription drugs, or dietary supplements you use. Also tell them if you smoke, drink alcohol, or use illegal drugs. Some items may interact with your medicine. What should I watch for while using this medicine? Your condition will be monitored  carefully while you are receiving this medicine. Visit your prescriber or health care professional for regular checks on your progress and for the needed blood tests. It is important to keep all appointments. What side effects may I notice from receiving this medicine? Side effects that you should report to your doctor or health care professional as soon as possible: -allergic reactions like skin rash, itching or hives, swelling of the face, lips, or tongue -shortness of breath, chest pain, swelling in a leg -unusual bleeding or bruising Side effects that usually do not require medical attention (report to your doctor or health care professional if they continue or are bothersome): -dizziness -headache -muscle aches -pain in arms and legs -stomach pain -trouble sleeping This list may not describe all possible side effects. Call your doctor for medical advice about side effects. You may report side effects to FDA at 1-800-FDA-1088. Where should I keep my medicine? This drug is given in a hospital or clinic and will not be stored at home. NOTE: This sheet is a summary. It may not cover all possible information. If you have questions about this medicine, talk to your doctor, pharmacist, or health care provider.    2016, Elsevier/Gold Standard. (2008-02-08 15:13:04)  

## 2016-02-18 LAB — HM MAMMOGRAPHY

## 2016-02-19 ENCOUNTER — Encounter: Payer: Self-pay | Admitting: Family

## 2016-02-23 ENCOUNTER — Ambulatory Visit: Payer: Medicare Other

## 2016-02-23 ENCOUNTER — Ambulatory Visit (HOSPITAL_BASED_OUTPATIENT_CLINIC_OR_DEPARTMENT_OTHER): Payer: Medicare Other

## 2016-02-23 ENCOUNTER — Other Ambulatory Visit (HOSPITAL_BASED_OUTPATIENT_CLINIC_OR_DEPARTMENT_OTHER): Payer: Medicare Other

## 2016-02-23 VITALS — HR 68 | Temp 98.3°F | Resp 20

## 2016-02-23 DIAGNOSIS — D696 Thrombocytopenia, unspecified: Secondary | ICD-10-CM

## 2016-02-23 DIAGNOSIS — D693 Immune thrombocytopenic purpura: Secondary | ICD-10-CM

## 2016-02-23 DIAGNOSIS — C92Z0 Other myeloid leukemia not having achieved remission: Secondary | ICD-10-CM

## 2016-02-23 DIAGNOSIS — D61818 Other pancytopenia: Secondary | ICD-10-CM

## 2016-02-23 DIAGNOSIS — C9200 Acute myeloblastic leukemia, not having achieved remission: Secondary | ICD-10-CM

## 2016-02-23 LAB — CBC WITH DIFFERENTIAL/PLATELET
BASO%: 0.7 % (ref 0.0–2.0)
Basophils Absolute: 0 10*3/uL (ref 0.0–0.1)
EOS ABS: 0.1 10*3/uL (ref 0.0–0.5)
EOS%: 2.5 % (ref 0.0–7.0)
HCT: 35.8 % (ref 34.8–46.6)
HGB: 11.9 g/dL (ref 11.6–15.9)
LYMPH%: 29.3 % (ref 14.0–49.7)
MCH: 31.8 pg (ref 25.1–34.0)
MCHC: 33.2 g/dL (ref 31.5–36.0)
MCV: 95.8 fL (ref 79.5–101.0)
MONO#: 0.2 10*3/uL (ref 0.1–0.9)
MONO%: 6.1 % (ref 0.0–14.0)
NEUT%: 61.4 % (ref 38.4–76.8)
NEUTROS ABS: 2.2 10*3/uL (ref 1.5–6.5)
PLATELETS: 162 10*3/uL (ref 145–400)
RBC: 3.73 10*6/uL (ref 3.70–5.45)
RDW: 17.4 % — ABNORMAL HIGH (ref 11.2–14.5)
WBC: 3.5 10*3/uL — AB (ref 3.9–10.3)
lymph#: 1 10*3/uL (ref 0.9–3.3)

## 2016-02-23 LAB — CHCC SMEAR

## 2016-02-23 MED ORDER — ROMIPLOSTIM 250 MCG ~~LOC~~ SOLR
60.0000 ug | Freq: Once | SUBCUTANEOUS | Status: AC
  Administered 2016-02-23: 60 ug via SUBCUTANEOUS
  Filled 2016-02-23: qty 0.12

## 2016-02-23 NOTE — Patient Instructions (Signed)
Romiplostim injection What is this medicine? ROMIPLOSTIM (roe mi PLOE stim) helps your body make more platelets. This medicine is used to treat low platelets caused by chronic idiopathic thrombocytopenic purpura (ITP). This medicine may be used for other purposes; ask your health care provider or pharmacist if you have questions. What should I tell my health care provider before I take this medicine? They need to know if you have any of these conditions: -cancer or myelodysplastic syndrome -low blood counts, like low white cell, platelet, or red cell counts -take medicines that treat or prevent blood clots -an unusual or allergic reaction to romiplostim, mannitol, other medicines, foods, dyes, or preservatives -pregnant or trying to get pregnant -breast-feeding How should I use this medicine? This medicine is for injection under the skin. It is given by a health care professional in a hospital or clinic setting. A special MedGuide will be given to you before your injection. Read this information carefully each time. Talk to your pediatrician regarding the use of this medicine in children. Special care may be needed. Overdosage: If you think you have taken too much of this medicine contact a poison control center or emergency room at once. NOTE: This medicine is only for you. Do not share this medicine with others. What if I miss a dose? It is important not to miss your dose. Call your doctor or health care professional if you are unable to keep an appointment. What may interact with this medicine? Interactions are not expected. This list may not describe all possible interactions. Give your health care provider a list of all the medicines, herbs, non-prescription drugs, or dietary supplements you use. Also tell them if you smoke, drink alcohol, or use illegal drugs. Some items may interact with your medicine. What should I watch for while using this medicine? Your condition will be monitored  carefully while you are receiving this medicine. Visit your prescriber or health care professional for regular checks on your progress and for the needed blood tests. It is important to keep all appointments. What side effects may I notice from receiving this medicine? Side effects that you should report to your doctor or health care professional as soon as possible: -allergic reactions like skin rash, itching or hives, swelling of the face, lips, or tongue -shortness of breath, chest pain, swelling in a leg -unusual bleeding or bruising Side effects that usually do not require medical attention (report to your doctor or health care professional if they continue or are bothersome): -dizziness -headache -muscle aches -pain in arms and legs -stomach pain -trouble sleeping This list may not describe all possible side effects. Call your doctor for medical advice about side effects. You may report side effects to FDA at 1-800-FDA-1088. Where should I keep my medicine? This drug is given in a hospital or clinic and will not be stored at home. NOTE: This sheet is a summary. It may not cover all possible information. If you have questions about this medicine, talk to your doctor, pharmacist, or health care provider.    2016, Elsevier/Gold Standard. (2008-02-08 15:13:04)  

## 2016-02-27 ENCOUNTER — Ambulatory Visit (HOSPITAL_COMMUNITY): Payer: Medicare Other

## 2016-02-27 ENCOUNTER — Other Ambulatory Visit: Payer: Self-pay | Admitting: Radiology

## 2016-02-28 ENCOUNTER — Ambulatory Visit (HOSPITAL_COMMUNITY): Payer: Medicare Other

## 2016-02-28 ENCOUNTER — Ambulatory Visit: Admission: RE | Admit: 2016-02-28 | Payer: Medicare Other | Source: Ambulatory Visit

## 2016-02-29 ENCOUNTER — Ambulatory Visit (HOSPITAL_COMMUNITY)
Admission: RE | Admit: 2016-02-29 | Discharge: 2016-02-29 | Disposition: A | Discharge status: Home / Self Care | Payer: Medicare Other | Source: Ambulatory Visit | Attending: Oncology | Admitting: Oncology

## 2016-02-29 ENCOUNTER — Encounter (HOSPITAL_COMMUNITY): Payer: Self-pay

## 2016-02-29 DIAGNOSIS — Z96651 Presence of right artificial knee joint: Secondary | ICD-10-CM | POA: Diagnosis not present

## 2016-02-29 DIAGNOSIS — I10 Essential (primary) hypertension: Secondary | ICD-10-CM | POA: Diagnosis not present

## 2016-02-29 DIAGNOSIS — Z9221 Personal history of antineoplastic chemotherapy: Secondary | ICD-10-CM | POA: Insufficient documentation

## 2016-02-29 DIAGNOSIS — F419 Anxiety disorder, unspecified: Secondary | ICD-10-CM | POA: Insufficient documentation

## 2016-02-29 DIAGNOSIS — D649 Anemia, unspecified: Secondary | ICD-10-CM | POA: Diagnosis not present

## 2016-02-29 DIAGNOSIS — K219 Gastro-esophageal reflux disease without esophagitis: Secondary | ICD-10-CM | POA: Insufficient documentation

## 2016-02-29 DIAGNOSIS — F329 Major depressive disorder, single episode, unspecified: Secondary | ICD-10-CM | POA: Insufficient documentation

## 2016-02-29 DIAGNOSIS — M1990 Unspecified osteoarthritis, unspecified site: Secondary | ICD-10-CM | POA: Insufficient documentation

## 2016-02-29 DIAGNOSIS — D693 Immune thrombocytopenic purpura: Secondary | ICD-10-CM | POA: Diagnosis not present

## 2016-02-29 DIAGNOSIS — C9201 Acute myeloblastic leukemia, in remission: Secondary | ICD-10-CM

## 2016-02-29 DIAGNOSIS — D72819 Decreased white blood cell count, unspecified: Secondary | ICD-10-CM | POA: Diagnosis not present

## 2016-02-29 DIAGNOSIS — Z96612 Presence of left artificial shoulder joint: Secondary | ICD-10-CM | POA: Insufficient documentation

## 2016-02-29 DIAGNOSIS — R0602 Shortness of breath: Secondary | ICD-10-CM | POA: Insufficient documentation

## 2016-02-29 HISTORY — DX: Malignant (primary) neoplasm, unspecified: C80.1

## 2016-02-29 LAB — CBC WITH DIFFERENTIAL/PLATELET
BASOS ABS: 0 10*3/uL (ref 0.0–0.1)
Basophils Relative: 1 %
EOS PCT: 2 %
Eosinophils Absolute: 0.1 10*3/uL (ref 0.0–0.7)
HEMATOCRIT: 35.8 % — AB (ref 36.0–46.0)
Hemoglobin: 12 g/dL (ref 12.0–15.0)
LYMPHS ABS: 0.8 10*3/uL (ref 0.7–4.0)
LYMPHS PCT: 30 %
MCH: 31.9 pg (ref 26.0–34.0)
MCHC: 33.5 g/dL (ref 30.0–36.0)
MCV: 95.2 fL (ref 78.0–100.0)
MONO ABS: 0 10*3/uL — AB (ref 0.1–1.0)
Monocytes Relative: 1 %
NEUTROS ABS: 1.8 10*3/uL (ref 1.7–7.7)
Neutrophils Relative %: 66 %
PLATELETS: 228 10*3/uL (ref 150–400)
RBC: 3.76 MIL/uL — AB (ref 3.87–5.11)
RDW: 16.3 % — ABNORMAL HIGH (ref 11.5–15.5)
WBC: 2.7 10*3/uL — AB (ref 4.0–10.5)

## 2016-02-29 LAB — BASIC METABOLIC PANEL
ANION GAP: 9 (ref 5–15)
BUN: 16 mg/dL (ref 6–20)
CHLORIDE: 103 mmol/L (ref 101–111)
CO2: 25 mmol/L (ref 22–32)
Calcium: 9.1 mg/dL (ref 8.9–10.3)
Creatinine, Ser: 0.89 mg/dL (ref 0.44–1.00)
GFR calc Af Amer: >60 mL/min (ref >60)
GFR calc non Af Amer: >60 mL/min (ref >60)
GLUCOSE: 89 mg/dL (ref 65–99)
POTASSIUM: 4.9 mmol/L (ref 3.5–5.1)
Sodium: 137 mmol/L (ref 135–145)

## 2016-02-29 LAB — PROTIME-INR
INR: 1.13
Prothrombin Time: 14.5 seconds (ref 11.4–15.2)

## 2016-02-29 LAB — BONE MARROW EXAM

## 2016-02-29 MED ORDER — FENTANYL CITRATE (PF) 100 MCG/2ML IJ SOLN
INTRAMUSCULAR | Status: AC
  Filled 2016-02-29: qty 2

## 2016-02-29 MED ORDER — MIDAZOLAM HCL 2 MG/2ML IJ SOLN
INTRAMUSCULAR | Status: AC
  Filled 2016-02-29: qty 2

## 2016-02-29 MED ORDER — MIDAZOLAM HCL 2 MG/2ML IJ SOLN
INTRAMUSCULAR | Status: AC | PRN
  Administered 2016-02-29: 1 mg via INTRAVENOUS
  Administered 2016-02-29: 0.5 mg via INTRAVENOUS

## 2016-02-29 MED ORDER — FENTANYL CITRATE (PF) 100 MCG/2ML IJ SOLN
INTRAMUSCULAR | Status: AC | PRN
  Administered 2016-02-29: 50 ug via INTRAVENOUS
  Administered 2016-02-29: 25 ug via INTRAVENOUS

## 2016-02-29 MED ORDER — SODIUM CHLORIDE 0.9 % IV SOLN
INTRAVENOUS | Status: DC
  Administered 2016-02-29: 09:00:00 via INTRAVENOUS

## 2016-02-29 NOTE — Progress Notes (Signed)
Patient ID: [name], female   DOB: Dec 21, 1935, 80 y.o.   MRN: [id number]    Referring Physician(s): Lowella Dell  Supervising Physician: Gilmer Mor  Patient Status:  Outpatient  Chief Complaint:  "I'm having another bone marrow biopsy"  Subjective: Patient familiar to IR service from prior bone marrow biopsy on 05/25/15. She has a history of AML/ITP and status post chemotherapy. She presents again today for restaging bone marrow biopsy. He currently denies fever, headache, cough, abdominal/back pain, nausea, vomiting or abnormal bleeding. She does have some mild left lateral chest/rib discomfort following recent fall at home. Additional history as below. Past Medical History:  Diagnosis Date  . Anemia   . Anxiety   . Arthritis    "hand joints; hips; back" (05/23/2015)  . Cancer (HCC)    dec. 2016-leukemia  . Chronic lower back pain   . Depression   . GERD (gastroesophageal reflux disease)   . History of blood transfusion 05/23/2015   "Hgb 5.9"  . Hypertension   . Shortness of breath dyspnea    with exertion   Past Surgical History:  Procedure Laterality Date  . Bone spur     R thigh  . CATARACT EXTRACTION W/ INTRAOCULAR LENS  IMPLANT, BILATERAL  ~ 2005  . DILATION AND CURETTAGE OF UTERUS    . ESOPHAGOGASTRODUODENOSCOPY (EGD) WITH PROPOFOL N/A 11/08/2014   Procedure: ESOPHAGOGASTRODUODENOSCOPY (EGD) WITH PROPOFOL;  Surgeon: Charolett Bumpers, MD;  Location: [facility];  Service: Endoscopy;  Laterality: N/A;  . JOINT REPLACEMENT    . TONSILLECTOMY  ~ 1950  . TOTAL KNEE ARTHROPLASTY  04/27/2012   Procedure: TOTAL KNEE ARTHROPLASTY;  Surgeon: Shelda Pal, MD;  Location: [facility];  Service: Orthopedics;  Laterality: Right;  . TOTAL SHOULDER ARTHROPLASTY  09/13/2011   Procedure: TOTAL SHOULDER ARTHROPLASTY;  Surgeon: Verlee Rossetti, MD;  Location: [facility];  Service: Orthopedics;  Laterality: Left;  LEFT SHOULDER REVERSED TOTAL SHOULDER ARTHROPLASTY      Allergies: Cozaar [losartan]; Codeine; Hydrocodone; Oxycodone; and Tetanus toxoids  Medications: Prior to Admission medications   Medication Sig Start Date End Date Taking? Authorizing Provider  amLODipine (NORVASC) 2.5 MG tablet TAKE ONE TABLET BY MOUTH ONCE DAILY 10/02/15  Yes Rollene Rotunda, MD  Cyanocobalamin 1000 MCG/ML KIT Inject 1,000 mcg as directed every 30 (thirty) days. B 12 shot 08/22/15  Yes Veryl Speak, FNP  hydrochlorothiazide (HYDRODIURIL) 25 MG tablet TAKE ONE TABLET BY MOUTH ONCE DAILY 11/21/15  Yes Veryl Speak, FNP  Melatonin 5 MG TABS Take 2.5 mg by mouth at bedtime.   Yes Historical Provider, MD  Multiple Vitamin (MULTIVITAMIN WITH MINERALS) TABS tablet Take 1 tablet by mouth daily.   Yes Historical Provider, MD  pantoprazole (PROTONIX) 40 MG tablet Take 40 mg by mouth every morning.   Yes Historical Provider, MD  Venlafaxine HCl 150 MG TB24 TAKE ONE TABLET BY MOUTH ONCE DAILY 10/17/15  Yes Veryl Speak, FNP  Vitamin D, Ergocalciferol, (DRISDOL) 50000 units CAPS capsule Take 1 capsule (50,000 Units total) by mouth See admin instructions. Every other week. 07/21/15  Yes Veryl Speak, FNP  valACYclovir (VALTREX) 1000 MG tablet Take 1,000 mg by mouth 2 (two) times daily.  06/30/15   Historical Provider, MD     Vital Signs: BP (!) 147/68 (BP Location: Left Arm)   Pulse 75   Temp 97.8 F (36.6 C) (Oral)   Resp 18   SpO2 97%   Physical Exam patient awake, alert. Chest with few  fine basilar crackles on right, left clear. Heart with regular rate and rhythm. Abdomen soft, positive bowel sounds, nontender. Lower extremities with 1+ edema bilat  Imaging: No results found.  Labs:  CBC:  Recent Labs  02/09/16 1041 02/16/16 1032 02/23/16 1123 02/29/16 0820  WBC 2.5* 5.8 3.5* 2.7*  HGB 11.9 12.2 11.9 12.0  HCT 36.1 35.6 35.8 35.8*  PLT 306 207 162 228    COAGS:  Recent Labs  05/23/15 1140 05/24/15 0415 02/29/16 0820  INR 1.33 1.29 1.13   APTT 33  --   --     BMP:  Recent Labs  05/23/15 1141 05/24/15 0415 05/25/15 0332  12/29/15 1111 01/08/16 1034 02/05/16 1055 02/29/16 0820  NA 137 138 138  < > 141 140 138 137  K 3.6 3.1* 4.1  < > 3.6 3.6 3.7 4.9  CL 104 103 104  --   --   --   --  103  CO2 '26 29 28  '$ < > '27 28 27 25  '$ GLUCOSE 99 123* 96  < > 87 101 94 89  BUN '17 16 17  '$ < > 16.6 13.2 12.1 16  CALCIUM 8.9 8.8* 8.8*  < > 9.1 9.2 9.7 9.1  CREATININE 1.04* 0.99 0.93  < > 1.0 0.9 0.9 0.89  GFRNONAA 50* 54* 58*  --   --   --   --  >60  GFRAA 59* >60 >60  --   --   --   --  >60  < > = values in this interval not displayed.  LIVER FUNCTION TESTS:  Recent Labs  12/08/15 1217 12/29/15 1111 01/08/16 1034 02/05/16 1055  BILITOT 0.57 0.63 0.53 0.87  AST '20 17 18 21  '$ ALT '15 13 14 15  '$ ALKPHOS 66 67 69 70  PROT 6.8 6.4 6.6 6.9  ALBUMIN 3.6 3.5 3.5 3.8    Assessment and Plan: Pt with history of AML/ITP ;prior BM bx 05/2015;  status post chemotherapy. She presents again today for restaging bone marrow biopsy. Risks and benefits discussed with the patient including, but not limited to bleeding, infection, damage to adjacent structures or low yield requiring additional tests. All of the patient's questions were answered, patient is agreeable to proceed. Consent signed and in chart.     Electronically Signed: D. Rowe Robert 02/29/2016, 10:24 AM   I spent a total of 15 minutes at the the patient's bedside AND on the patient's hospital floor or unit, greater than 50% of which was counseling/coordinating care for CT-guided bone marrow biopsy

## 2016-02-29 NOTE — Discharge Instructions (Signed)
Bone Marrow Aspiration and Bone Marrow Biopsy, Care After °Refer to this sheet in the next few weeks. These instructions provide you with information about caring for yourself after your procedure. Your health care provider may also give you more specific instructions. Your treatment has been planned according to current medical practices, but problems sometimes occur. Call your health care provider if you have any problems or questions after your procedure. °WHAT TO EXPECT AFTER THE PROCEDURE °After your procedure, it is common to have: °· Soreness or tenderness around the puncture site. °· Bruising. °HOME CARE INSTRUCTIONS °· Take medicines only as directed by your health care provider. °· Follow your health care provider's instructions about: °¨ Puncture site care. °¨ Bandage (dressing) changes and removal. °· Bathe and shower as directed by your health care provider. °· Check your puncture site every day for signs of infection. Watch for: °¨ Redness, swelling, or pain. °¨ Fluid, blood, or pus. °· Return to your normal activities as directed by your health care provider. °· Keep all follow-up visits as directed by your health care provider. This is important. °SEEK MEDICAL CARE IF: °· You have a fever. °· You have uncontrollable bleeding. °· You have redness, swelling, or pain at the site of your puncture. °· You have fluid, blood, or pus coming from your puncture site. °  °This information is not intended to replace advice given to you by your health care provider. Make sure you discuss any questions you have with your health care provider. °  °Document Released: 12/28/2004 Document Revised: 10/25/2014 Document Reviewed: 06/01/2014 °Elsevier Interactive Patient Education ©2016 [employer] °Moderate Conscious Sedation, Adult, Care After °Refer to this sheet in the next few weeks. These instructions provide you with information on caring for yourself after your procedure. Your health care provider may also give  you more specific instructions. Your treatment has been planned according to current medical practices, but problems sometimes occur. Call your health care provider if you have any problems or questions after your procedure. °WHAT TO EXPECT AFTER THE PROCEDURE  °After your procedure: °· You may feel sleepy, clumsy, and have poor balance for several hours. °· Vomiting may occur if you eat too soon after the procedure. °HOME CARE INSTRUCTIONS °· Do not participate in any activities where you could become injured for at least 24 hours. Do not: °¨ Drive. °¨ Swim. °¨ Ride a bicycle. °¨ Operate heavy machinery. °¨ Cook. °¨ Use power tools. °¨ Climb ladders. °¨ Work from a high place. °· Do not make important decisions or sign legal documents until you are improved. °· If you vomit, drink water, juice, or soup when you can drink without vomiting. Make sure you have little or no nausea before eating solid foods. °· Only take over-the-counter or prescription medicines for pain, discomfort, or fever as directed by your health care provider. °· Make sure you and your family fully understand everything about the medicines given to you, including what side effects may occur. °· You should not drink alcohol, take sleeping pills, or take medicines that cause drowsiness for at least 24 hours. °· If you smoke, do not smoke without supervision. °· If you are feeling better, you may resume normal activities 24 hours after you were sedated. °· Keep all appointments with your health care provider. °SEEK MEDICAL CARE IF: °· Your skin is pale or bluish in color. °· You continue to feel nauseous or vomit. °· Your pain is getting worse and is not helped by medicine. °·   You have bleeding or swelling. °· You are still sleepy or feeling clumsy after 24 hours. °SEEK IMMEDIATE MEDICAL CARE IF: °· You develop a rash. °· You have difficulty breathing. °· You develop any type of allergic problem. °· You have a fever. °MAKE SURE YOU: °· Understand  these instructions. °· Will watch your condition. °· Will get help right away if you are not doing well or get worse. °  °This information is not intended to replace advice given to you by your health care provider. Make sure you discuss any questions you have with your health care provider. °  °Document Released: 03/31/2013 Document Revised: 07/01/2014 Document Reviewed: 03/31/2013 °Elsevier Interactive Patient Education ©2016 Elsevier Inc. ° ° °

## 2016-02-29 NOTE — Procedures (Signed)
Interventional Radiology Procedure Note  Procedure: CT guided aspirate and core biopsy of right posterior iliac bone Complications: None Recommendations: - Bedrest supine x 1 hrs - OTC's PRN  Pain - Follow biopsy results  Signed,  Tuere Nwosu S. Hollin Crewe, DO    

## 2016-03-01 ENCOUNTER — Other Ambulatory Visit (HOSPITAL_BASED_OUTPATIENT_CLINIC_OR_DEPARTMENT_OTHER): Payer: Medicare Other

## 2016-03-01 ENCOUNTER — Ambulatory Visit (HOSPITAL_BASED_OUTPATIENT_CLINIC_OR_DEPARTMENT_OTHER): Payer: Medicare Other

## 2016-03-01 ENCOUNTER — Other Ambulatory Visit: Payer: Self-pay | Admitting: Family

## 2016-03-01 ENCOUNTER — Other Ambulatory Visit: Payer: Medicare Other

## 2016-03-01 ENCOUNTER — Ambulatory Visit: Payer: Medicare Other

## 2016-03-01 VITALS — BP 149/73 | HR 72 | Temp 98.0°F | Resp 20

## 2016-03-01 DIAGNOSIS — C92Z0 Other myeloid leukemia not having achieved remission: Secondary | ICD-10-CM | POA: Diagnosis not present

## 2016-03-01 DIAGNOSIS — D61818 Other pancytopenia: Secondary | ICD-10-CM

## 2016-03-01 DIAGNOSIS — C9200 Acute myeloblastic leukemia, not having achieved remission: Secondary | ICD-10-CM

## 2016-03-01 DIAGNOSIS — D693 Immune thrombocytopenic purpura: Secondary | ICD-10-CM | POA: Diagnosis not present

## 2016-03-01 DIAGNOSIS — D696 Thrombocytopenia, unspecified: Secondary | ICD-10-CM

## 2016-03-01 LAB — COMPREHENSIVE METABOLIC PANEL
ALT: 13 U/L (ref 0–55)
ANION GAP: 9 meq/L (ref 3–11)
AST: 18 U/L (ref 5–34)
Albumin: 3.5 g/dL (ref 3.5–5.0)
Alkaline Phosphatase: 71 U/L (ref 40–150)
BILIRUBIN TOTAL: 0.83 mg/dL (ref 0.20–1.20)
BUN: 12.6 mg/dL (ref 7.0–26.0)
CALCIUM: 9.6 mg/dL (ref 8.4–10.4)
CO2: 29 mEq/L (ref 22–29)
CREATININE: 0.8 mg/dL (ref 0.6–1.1)
Chloride: 102 mEq/L (ref 98–109)
EGFR: 68 mL/min/{1.73_m2} — ABNORMAL LOW (ref >90)
Glucose: 82 mg/dl (ref 70–140)
Potassium: 3.6 mEq/L (ref 3.5–5.1)
Sodium: 139 mEq/L (ref 136–145)
TOTAL PROTEIN: 6.9 g/dL (ref 6.4–8.3)

## 2016-03-01 LAB — CBC WITH DIFFERENTIAL/PLATELET
BASO%: 0.8 % (ref 0.0–2.0)
Basophils Absolute: 0 10*3/uL (ref 0.0–0.1)
EOS%: 1.9 % (ref 0.0–7.0)
Eosinophils Absolute: 0.1 10*3/uL (ref 0.0–0.5)
HEMATOCRIT: 35.9 % (ref 34.8–46.6)
HEMOGLOBIN: 11.8 g/dL (ref 11.6–15.9)
LYMPH#: 0.9 10*3/uL (ref 0.9–3.3)
LYMPH%: 31 % (ref 14.0–49.7)
MCH: 31.7 pg (ref 25.1–34.0)
MCHC: 33 g/dL (ref 31.5–36.0)
MCV: 96 fL (ref 79.5–101.0)
MONO#: 0.1 10*3/uL (ref 0.1–0.9)
MONO%: 1.8 % (ref 0.0–14.0)
NEUT%: 64.5 % (ref 38.4–76.8)
NEUTROS ABS: 1.9 10*3/uL (ref 1.5–6.5)
PLATELETS: 238 10*3/uL (ref 145–400)
RBC: 3.74 10*6/uL (ref 3.70–5.45)
RDW: 17.6 % — AB (ref 11.2–14.5)
WBC: 2.9 10*3/uL — AB (ref 3.9–10.3)

## 2016-03-01 LAB — CHCC SMEAR

## 2016-03-01 MED ORDER — ROMIPLOSTIM 250 MCG ~~LOC~~ SOLR
60.0000 ug | Freq: Once | SUBCUTANEOUS | Status: AC
  Administered 2016-03-01: 60 ug via SUBCUTANEOUS
  Filled 2016-03-01: qty 0.12

## 2016-03-01 NOTE — Patient Instructions (Signed)
Romiplostim injection What is this medicine? ROMIPLOSTIM (roe mi PLOE stim) helps your body make more platelets. This medicine is used to treat low platelets caused by chronic idiopathic thrombocytopenic purpura (ITP). This medicine may be used for other purposes; ask your health care provider or pharmacist if you have questions. What should I tell my health care provider before I take this medicine? They need to know if you have any of these conditions: -cancer or myelodysplastic syndrome -low blood counts, like low white cell, platelet, or red cell counts -take medicines that treat or prevent blood clots -an unusual or allergic reaction to romiplostim, mannitol, other medicines, foods, dyes, or preservatives -pregnant or trying to get pregnant -breast-feeding How should I use this medicine? This medicine is for injection under the skin. It is given by a health care professional in a hospital or clinic setting. A special MedGuide will be given to you before your injection. Read this information carefully each time. Talk to your pediatrician regarding the use of this medicine in children. Special care may be needed. Overdosage: If you think you have taken too much of this medicine contact a poison control center or emergency room at once. NOTE: This medicine is only for you. Do not share this medicine with others. What if I miss a dose? It is important not to miss your dose. Call your doctor or health care professional if you are unable to keep an appointment. What may interact with this medicine? Interactions are not expected. This list may not describe all possible interactions. Give your health care provider a list of all the medicines, herbs, non-prescription drugs, or dietary supplements you use. Also tell them if you smoke, drink alcohol, or use illegal drugs. Some items may interact with your medicine. What should I watch for while using this medicine? Your condition will be monitored  carefully while you are receiving this medicine. Visit your prescriber or health care professional for regular checks on your progress and for the needed blood tests. It is important to keep all appointments. What side effects may I notice from receiving this medicine? Side effects that you should report to your doctor or health care professional as soon as possible: -allergic reactions like skin rash, itching or hives, swelling of the face, lips, or tongue -shortness of breath, chest pain, swelling in a leg -unusual bleeding or bruising Side effects that usually do not require medical attention (report to your doctor or health care professional if they continue or are bothersome): -dizziness -headache -muscle aches -pain in arms and legs -stomach pain -trouble sleeping This list may not describe all possible side effects. Call your doctor for medical advice about side effects. You may report side effects to FDA at 1-800-FDA-1088. Where should I keep my medicine? This drug is given in a hospital or clinic and will not be stored at home. NOTE: This sheet is a summary. It may not cover all possible information. If you have questions about this medicine, talk to your doctor, pharmacist, or health care provider.    2016, Elsevier/Gold Standard. (2008-02-08 15:13:04)  

## 2016-03-04 ENCOUNTER — Other Ambulatory Visit: Payer: Self-pay | Admitting: Oncology

## 2016-03-07 ENCOUNTER — Telehealth: Payer: Self-pay | Admitting: *Deleted

## 2016-03-07 NOTE — Telephone Encounter (Signed)
Per desk RN I ave moved 9/21 appt to earlier in the morning. Appt moved and desk RN to call the patient

## 2016-03-08 ENCOUNTER — Other Ambulatory Visit: Payer: Medicare Other

## 2016-03-08 ENCOUNTER — Ambulatory Visit: Payer: Medicare Other

## 2016-03-11 ENCOUNTER — Ambulatory Visit (HOSPITAL_BASED_OUTPATIENT_CLINIC_OR_DEPARTMENT_OTHER): Payer: Medicare Other | Admitting: Oncology

## 2016-03-11 ENCOUNTER — Other Ambulatory Visit: Payer: Self-pay | Admitting: Family

## 2016-03-11 ENCOUNTER — Other Ambulatory Visit (HOSPITAL_BASED_OUTPATIENT_CLINIC_OR_DEPARTMENT_OTHER): Payer: Medicare Other

## 2016-03-11 ENCOUNTER — Ambulatory Visit (HOSPITAL_BASED_OUTPATIENT_CLINIC_OR_DEPARTMENT_OTHER): Payer: Medicare Other

## 2016-03-11 VITALS — BP 160/72 | HR 83 | Temp 98.5°F | Resp 18 | Ht 68.0 in | Wt 144.8 lb

## 2016-03-11 DIAGNOSIS — C9200 Acute myeloblastic leukemia, not having achieved remission: Secondary | ICD-10-CM

## 2016-03-11 DIAGNOSIS — D469 Myelodysplastic syndrome, unspecified: Secondary | ICD-10-CM | POA: Insufficient documentation

## 2016-03-11 DIAGNOSIS — C92Z0 Other myeloid leukemia not having achieved remission: Secondary | ICD-10-CM

## 2016-03-11 DIAGNOSIS — Z5111 Encounter for antineoplastic chemotherapy: Secondary | ICD-10-CM

## 2016-03-11 DIAGNOSIS — C9201 Acute myeloblastic leukemia, in remission: Secondary | ICD-10-CM

## 2016-03-11 DIAGNOSIS — D61818 Other pancytopenia: Secondary | ICD-10-CM

## 2016-03-11 DIAGNOSIS — D693 Immune thrombocytopenic purpura: Secondary | ICD-10-CM | POA: Diagnosis not present

## 2016-03-11 LAB — CBC WITH DIFFERENTIAL/PLATELET
BASO%: 0.7 % (ref 0.0–2.0)
BASOS ABS: 0 10*3/uL (ref 0.0–0.1)
EOS%: 2.1 % (ref 0.0–7.0)
Eosinophils Absolute: 0.1 10*3/uL (ref 0.0–0.5)
HEMATOCRIT: 35.3 % (ref 34.8–46.6)
HGB: 12 g/dL (ref 11.6–15.9)
LYMPH#: 0.9 10*3/uL (ref 0.9–3.3)
LYMPH%: 32.3 % (ref 14.0–49.7)
MCH: 31.8 pg (ref 25.1–34.0)
MCHC: 34 g/dL (ref 31.5–36.0)
MCV: 93.6 fL (ref 79.5–101.0)
MONO#: 0.4 10*3/uL (ref 0.1–0.9)
MONO%: 12.6 % (ref 0.0–14.0)
NEUT#: 1.5 10*3/uL (ref 1.5–6.5)
NEUT%: 52.3 % (ref 38.4–76.8)
PLATELETS: 324 10*3/uL (ref 145–400)
RBC: 3.77 10*6/uL (ref 3.70–5.45)
RDW: 15.6 % — ABNORMAL HIGH (ref 11.2–14.5)
WBC: 2.9 10*3/uL — ABNORMAL LOW (ref 3.9–10.3)

## 2016-03-11 LAB — COMPREHENSIVE METABOLIC PANEL
ALK PHOS: 111 U/L (ref 40–150)
ALT: 15 U/L (ref 0–55)
ANION GAP: 8 meq/L (ref 3–11)
AST: 21 U/L (ref 5–34)
Albumin: 3.4 g/dL — ABNORMAL LOW (ref 3.5–5.0)
BUN: 13.7 mg/dL (ref 7.0–26.0)
CALCIUM: 9.5 mg/dL (ref 8.4–10.4)
CO2: 29 mEq/L (ref 22–29)
Chloride: 103 mEq/L (ref 98–109)
Creatinine: 0.8 mg/dL (ref 0.6–1.1)
EGFR: 69 mL/min/{1.73_m2} — AB (ref >90)
Glucose: 79 mg/dl (ref 70–140)
POTASSIUM: 3.8 meq/L (ref 3.5–5.1)
Sodium: 140 mEq/L (ref 136–145)
Total Bilirubin: 0.36 mg/dL (ref 0.20–1.20)
Total Protein: 6.6 g/dL (ref 6.4–8.3)

## 2016-03-11 LAB — CHCC SMEAR

## 2016-03-11 MED ORDER — AZACITIDINE CHEMO SQ INJECTION
75.0000 mg/m2 | Freq: Once | INTRAMUSCULAR | Status: AC
  Administered 2016-03-11: 140 mg via SUBCUTANEOUS
  Filled 2016-03-11: qty 5.6

## 2016-03-11 MED ORDER — ONDANSETRON HCL 8 MG PO TABS
8.0000 mg | ORAL_TABLET | Freq: Once | ORAL | Status: AC
  Administered 2016-03-11: 8 mg via ORAL

## 2016-03-11 MED ORDER — ONDANSETRON HCL 8 MG PO TABS
ORAL_TABLET | ORAL | Status: AC
  Filled 2016-03-11: qty 1

## 2016-03-11 NOTE — Patient Instructions (Signed)
[facility] Discharge Instructions for Patients Receiving Chemotherapy  Today you received the following chemotherapy agents: Vidaza   To help prevent nausea and vomiting after your treatment, we encourage you to take your nausea medication as directed.    If you develop nausea and vomiting that is not controlled by your nausea medication, call the clinic.   BELOW ARE SYMPTOMS THAT SHOULD BE REPORTED IMMEDIATELY:  *FEVER GREATER THAN 100.5 F  *CHILLS WITH OR WITHOUT FEVER  NAUSEA AND VOMITING THAT IS NOT CONTROLLED WITH YOUR NAUSEA MEDICATION  *UNUSUAL SHORTNESS OF BREATH  *UNUSUAL BRUISING OR BLEEDING  TENDERNESS IN MOUTH AND THROAT WITH OR WITHOUT PRESENCE OF ULCERS  *URINARY PROBLEMS  *BOWEL PROBLEMS  UNUSUAL RASH Items with * indicate a potential emergency and should be followed up as soon as possible.  Feel free to call the clinic you have any questions or concerns. The clinic phone number is [phone].  Please show the CHEMO ALERT CARD at check-in to the Emergency Department and triage nurse.   

## 2016-03-11 NOTE — Progress Notes (Signed)
[place]  [phone] [phone]   ID: [name] DOB: 02-13-1936  MR#: [id number]  OIN#:[id number]  Patient Care Team: [place], FNP as PCP - General (Family Medicine) PCP: [name], FNP, [name] GYN: SU:  OTHER MD: [name], [name], [name]  CHIEF COMPLAINT: Acute myeloid leukemia; ITP  CURRENT TREATMENT: Azacitidine, romiplostim  HISTORY OF PRESENT ILLNESS: From the 05/22/2025 consult note:  "The patient was evaluated at her PCP's office 78/78/2016 with a complaint of DOE worsening over several months. Labwork was obtained including a CBC, whioch showed WBC 1.8, platelets 14K, and Hb 5.9 with an MCV of 97.5. DDimer was 1.04. Accordingly the patient was referred to the ED and was admitted 05/23/2015.   Labwork since admission includes a repeat CBC with WBC 1.7, HB 5.4, MCV 100.6 and platelets 10K. Creat was 1.04 with GFR 50. Reticulocytes are not elevated with abs 39.3 (1.7%) and LDH is normal at 188. Other labs are pending.  The patient tells me because of peripheral neuropathy she has been receiving monthly B-12 shots since 2005."  Bone marrow biopsy 05/25/2015 showed acute myeloid leukemia, with 17% blasts by flow cytometry, 24% by aspirate counts, and 20-30% by immunohistochemistry ([id number], and 898). The blasts were positive for CD 117 and focally for MPO. There were also myelodysplasia related changes in all 3 cell lines.  The patient's subsequent history is as detailed below.  INTERVAL HISTORY: [name] returns today for follow-up of her myelodysplasia/acute myeloid leukemia. Today is day 1 cycle 10 of azacitidine, given for 7 doses (with an intervening weekend) every 28 days. She also receives romiplostim as needed for her associated severe thrombocytopenia, which however has been remarkably stable over the past few months.  On 02/29/2016 she had a repeat bone marrow biopsy which showed  no increase in the last. There was mild dysplasia and mild hypercellularity.  On 03/07/2016 she saw Dr Nadara Mustard at [facility]; for our records I am copying her comments:  "AML with multilineage dysplasia Responding very well to [place], now s/p 10 cycles - she indicated she has had a marrow biopsy that showed "no evidence of disease" per her report. We will work to get those results from Dr. Virgie Dad office. With good response, median duration of response is 12-18 mos survival. Reiterated to her that this is giving her good QOL for now and is recommended as optimal treatment of her disease. However, it is a given that Vidaza will stop working at some point.   I did discuss with them that there are institutions offering transplant at almost any age if a patient is physically fit enough. I think some of her physical performance limits are a risk factor for her wrt outcomes with transplant. I don't think offering her a [facility] NMA transplant is to her advantage - there are significant issues with GVHD prophylaxis and treatment emerging due to toxicity of our standard GVHD prevention/treatment drugs in this age group. I recommended if she wanted to pursue transplant then a referral to an institution that is researching optimal strategies in advanced age, particularly wrt modulating the cellular product to reduce GVHD impact, is recommended.  Possible ITP Getting romiplostim; now with platelet count >300. She asked whether she should get the dose tomorrow and I advised her to skip the dose and discuss with Dr. Jana Hakim since administration with platelet count >300 is not recommended. She may need the dose decreased.  Would recommend rechecking marrow after 12  cycles of Vidaza which will both provide information wrt benefit against the AML/MDS but also monitor for clonal evolution and/or fibrosis - both observed in MDS patients treated with a similar drug (eltrombopag).  Followup She will RTC again in 6 mos. If there are  any changes prior to that time, I am happy to see her sooner."   REVIEW OF SYSTEMS: Bryonna tolerates is azacitidine well, and denies problems with skin reactions, fever, rash, bleeding, or fatigue. She has pain in her hip joints, a bit of a runny nose, and stress urinary incontinence problems. Sometimes she feels depressed although she tells me recently her husband is doing a little bit better on a complex combination of anti-coagulates. A detailed review of systems today was otherwise stable  PAST MEDICAL HISTORY: Past Medical History:  Diagnosis Date  . Anemia   . Anxiety   . Arthritis    "hand joints; hips; back" (05/23/2015)  . Cancer ([place])    dec. 2016-leukemia  . Chronic lower back pain   . Depression   . GERD (gastroesophageal reflux disease)   . History of blood transfusion 05/23/2015   "Hgb 5.9"  . Hypertension   . Shortness of breath dyspnea    with exertion    PAST SURGICAL HISTORY: Past Surgical History:  Procedure Laterality Date  . Bone spur     R thigh  . CATARACT EXTRACTION W/ INTRAOCULAR LENS  IMPLANT, BILATERAL  ~ 2005  . DILATION AND CURETTAGE OF UTERUS    . ESOPHAGOGASTRODUODENOSCOPY (EGD) WITH PROPOFOL N/A 11/08/2014   Procedure: ESOPHAGOGASTRODUODENOSCOPY (EGD) WITH PROPOFOL;  Surgeon: Garlan Fair, MD;  Location: [facility];  Service: Endoscopy;  Laterality: N/A;  . JOINT REPLACEMENT    . TONSILLECTOMY  ~ 1950  . TOTAL KNEE ARTHROPLASTY  04/27/2012   Procedure: TOTAL KNEE ARTHROPLASTY;  Surgeon: Mauri Pole, MD;  Location: [facility];  Service: Orthopedics;  Laterality: Right;  . TOTAL SHOULDER ARTHROPLASTY  09/13/2011   Procedure: TOTAL SHOULDER ARTHROPLASTY;  Surgeon: Augustin Schooling, MD;  Location: [facility];  Service: Orthopedics;  Laterality: Left;  LEFT SHOULDER REVERSED TOTAL SHOULDER ARTHROPLASTY    FAMILY HISTORY Family History  Problem Relation Age of Onset  . Heart attack Mother 31    Died age 4  . Heart disease Brother     Atrial fib    Patient's father died age 36 with prostate cancer; mother died atv71 with an MI. One brother, alive at 38; one sister with parkinson's. No blood or cancer problems otherwise in family  GYNECOLOGIC HISTORY:  No LMP recorded. Patient is postmenopausal. Menarche age 23, first live birth age 7, she is GXP3; menopause age 27, no HR  SOCIAL HISTORY:  Grade school Pharmacist, hospital, retired; husband was a Higher education careers adviser. He has severe heart disease and she is his main caretaker. Son [name] works for [employer] and sings in the [employer]; daughter Ashland lives in [place], a homemaker; daughter [name] works at the surgical center in [employer] as an Therapist, sports. The patient has 7 gch. She is a Psychologist, forensic    ADVANCED DIRECTIVES: in place; the patient's son [name] is her [place]: Social History  Substance Use Topics  . Smoking status: Never Smoker  . Smokeless tobacco: Never Used  . Alcohol use No     Colonoscopy:  PAP:  Bone density:  Lipid panel:  Allergies  Allergen Reactions  . Cozaar [Losartan] Other (See Comments)    Cause pt to lose her taste  for foods  . Codeine Itching and Other (See Comments)    Makes feel crazy  PT STATES SHE ALSO CAN NOT TAKE THE SYNTHETIC CODEINES  . Hydrocodone Itching    Tolerates with benadryl  . Oxycodone Itching  . Tetanus Toxoids Swelling and Rash    Current Outpatient Prescriptions  Medication Sig Dispense Refill  . amLODipine (NORVASC) 2.5 MG tablet TAKE ONE TABLET BY MOUTH ONCE DAILY 90 tablet 5  . Cyanocobalamin 1000 MCG/ML KIT Inject 1,000 mcg as directed every 30 (thirty) days. B 12 shot 10 kit 1  . hydrochlorothiazide (HYDRODIURIL) 25 MG tablet Take 1 tablet (25 mg total) by mouth daily. Yearly physical due in Nov must see MD for refills 90 tablet 0  . hydrochlorothiazide (HYDRODIURIL) 25 MG tablet Take 1 tablet (25 mg total) by mouth daily. Yearly physical due in Nov must see MD for refills 90 tablet 0  . Melatonin 5 MG TABS Take 2.5 mg by  mouth at bedtime.    . Multiple Vitamin (MULTIVITAMIN WITH MINERALS) TABS tablet Take 1 tablet by mouth daily.    . pantoprazole (PROTONIX) 40 MG tablet Take 40 mg by mouth every morning.    . valACYclovir (VALTREX) 1000 MG tablet Take 1,000 mg by mouth 2 (two) times daily.     . Venlafaxine HCl 150 MG TB24 TAKE ONE TABLET BY MOUTH ONCE DAILY 30 tablet 5  . Vitamin D, Ergocalciferol, (DRISDOL) 50000 units CAPS capsule Take 1 capsule (50,000 Units total) by mouth See admin instructions. Every other week. 30 capsule 1   No current facility-administered medications for this visit.     OBJECTIVE: Older white woman Who appears stated age 80:   03/11/16 1211 03/11/16 1214  BP: (!) 160/72 (!) 160/72  Pulse: 83 83  Resp: 18 18  Temp: 98.5 F (36.9 C) 98.5 F (36.9 C)     Body mass index is 22.02 kg/m.    ECOG FS:1 - Symptomatic but completely ambulatory  Sclerae unicteric, EOMs intact Oropharynx clear and moist No cervical or supraclavicular adenopathy Lungs no rales or rhonchi Heart regular rate and rhythm Abd soft, nontender, positive bowel sounds MSK no focal spinal tenderness, no upper extremity lymphedema, walks with a cane Neuro: nonfocal, well oriented, appropriate affect Breasts: Deferred    LAB RESULTS:  CMP     Component Value Date/Time   NA 140 03/11/2016 1137   K 3.8 03/11/2016 1137   CL 103 02/29/2016 0820   CO2 29 03/11/2016 1137   GLUCOSE 79 03/11/2016 1137   BUN 13.7 03/11/2016 1137   CREATININE 0.8 03/11/2016 1137   CALCIUM 9.5 03/11/2016 1137   PROT 6.6 03/11/2016 1137   ALBUMIN 3.4 (L) 03/11/2016 1137   AST 21 03/11/2016 1137   ALT 15 03/11/2016 1137   ALKPHOS 111 03/11/2016 1137   BILITOT 0.36 03/11/2016 1137   GFRNONAA >60 02/29/2016 0820   GFRAA >60 02/29/2016 0820    INo results found for: SPEP, UPEP  Lab Results  Component Value Date   WBC 2.9 (L) 03/11/2016   NEUTROABS 1.5 03/11/2016   HGB 12.0 03/11/2016   HCT 35.3 03/11/2016    MCV 93.6 03/11/2016   PLT 324 03/11/2016      Chemistry      Component Value Date/Time   NA 140 03/11/2016 1137   K 3.8 03/11/2016 1137   CL 103 02/29/2016 0820   CO2 29 03/11/2016 1137   BUN 13.7 03/11/2016 1137   CREATININE 0.8 03/11/2016 1137  Component Value Date/Time   CALCIUM 9.5 03/11/2016 1137   ALKPHOS 111 03/11/2016 1137   AST 21 03/11/2016 1137   ALT 15 03/11/2016 1137   BILITOT 0.36 03/11/2016 1137       No results found for: LABCA2  No components found for: LABCA125  No results for input(s): INR in the last 168 hours.  Urinalysis    Component Value Date/Time   COLORURINE YELLOW 04/21/2012 0946   APPEARANCEUR CLOUDY (A) 04/21/2012 0946   LABSPEC 1.019 04/21/2012 0946   PHURINE 7.0 04/21/2012 0946   GLUCOSEU NEGATIVE 04/21/2012 0946   HGBUR NEGATIVE 04/21/2012 0946   BILIRUBINUR NEGATIVE 04/21/2012 0946   KETONESUR NEGATIVE 04/21/2012 0946   PROTEINUR NEGATIVE 04/21/2012 0946   UROBILINOGEN 1.0 04/21/2012 0946   NITRITE NEGATIVE 04/21/2012 0946   LEUKOCYTESUR NEGATIVE 04/21/2012 0946    STUDIES: Patient: JOBY, HERSHKOWITZ Collected: 02/29/2016 Client: [facility] Accession: [id number] Received: 02/29/2016 Corrie Mckusick, DO DOB: [birth year] Age: 89 Gender: F Reported: 03/04/2016 [place] Patient Ph: [phone] MRN #: [id number] [place], [place] [address] Visit #: [id number] Chart #: Phone: [phone] Fax: CC: Lurline Del, MD BONE MARROW REPORT FINAL DIAGNOSIS Diagnosis Bone Marrow, Aspirate,Biopsy, and Clot - MILDLY HYPERCELLULAR MARROW WITH DYSPOIESIS. - NO INCREASE IN BLASTS. - SEE COMMENT. PERIPHERAL BLOOD: - MILD LEUKOPENIA. Diagnosis Note The marrow is mildly hypercellular with dysplasia in the erythroid and megakaryocytic lineages. There is no increase in blasts in the aspirate, by CD34, or by flow cytometry. Given the patient's history of acute myeloid leukemia with myelodysplasia related changes,  the current findings likely represent reversion to an underlying myelodysplastic syndrome. Correlation with cytogenetics is recommended. Vicente Males MD Pathologist, Electronic Signature (Case signed 03/04/2016) GROSS AND MICROSCOPIC INFORMATION  Ct Biopsy  Result Date: 02/29/2016 INDICATION: 80 year old female with a history of idiopathic thrombus cytopenia. EXAM: CT BIOPSY MEDICATIONS: None. ANESTHESIA/SEDATION: Moderate (conscious) sedation was employed during this procedure. A total of Versed 1.5 mg and Fentanyl 75 mcg was administered intravenously. Moderate Sedation Time: 9 minutes. The patient's level of consciousness and vital signs were monitored continuously by radiology nursing throughout the procedure under my direct supervision. FLUOROSCOPY TIME:  CT COMPLICATIONS: None PROCEDURE: The procedure risks, benefits, and alternatives were explained to the patient. Questions regarding the procedure were encouraged and answered. The patient understands and consents to the procedure. Scout CT of the pelvis was performed for surgical planning purposes. The posterior pelvis was prepped with Betadinein a sterile fashion, and a sterile drape was applied covering the operative field. A sterile gown and sterile gloves were used for the procedure. Local anesthesia was provided with 1% Lidocaine. We targeted the right posterior iliac bone for biopsy. The skin and subcutaneous tissues were infiltrated with 1% lidocaine without epinephrine. A small stab incision was made with an 11 blade scalpel, and an 11 gauge Murphy needle was advanced with CT guidance to the posterior cortex. Manual forced was used to advance the needle through the posterior cortex and the stylet was removed. A bone marrow aspirate was retrieved and passed to a cytotechnologist in the room. The Murphy needle was then advanced without the stylet for a core biopsy. The core biopsy was retrieved and also passed to a cytotechnologist. Manual  pressure was used for hemostasis and a sterile dressing was placed. No complications were encountered no significant blood loss was encountered. Patient tolerated the procedure well and remained hemodynamically stable throughout. IMPRESSION: Status post CT-guided bone marrow biopsy, with tissue specimen sent to pathology for  complete histopathologic analysis Signed, Dulcy Fanny. Earleen Newport, DO [facility] Radiology Electronically Signed   By: Corrie Mckusick D.O.   On: 02/29/2016 15:00   .  ASSESSMENT: 80 y.o. [place] woman with acute myeloid leukemia diagnosed by bone marrow biopsy 05/25/2015, with the blast count between 17 and 30% depending on the method of determination.   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, with gain of 2 and 19  (b) consider allogenic transplant  (c) all transfusion products to be irradiated  (1) started sQ azacitidine 06/05/2015, receiving 7 doses every 28 day cycle  (a) repeat bone marrow biopsy 02/29/2016 showed a mildly hypercellular marrow with dyspoiesis, but no increase in blasts either histologically or by flow cytometry  (2) immune thrombocytopenic purpura  (a) s/p IVIG 07/06/2015 (1g/kg x 2d) with excellent response  (b) romiplostim first dose 07/07/2015, started weekly as of 08/04/2015  (3) biopsy of an enlarged right axillary lymph node at [facility] 11/13/2015 was nondiagnostic.  PLAN:  Mckensie is very pleased with her bone marrow results, as am I. At this point the plan is to continue this indefinitely. Dr. Nadara Mustard has suggested that we repeat a bone marrow biopsy after 3 more cycles which we can easily do. The patient will be seen again at [facility] 09/09/2016.  Dr. Nadara Mustard also suggested we try to decrease the use of romiplostim, which can cause more fibrosis. I change the order so that the patient will not receive Nplate for a platelet count of 300 or more.  Otherwise Marleni is  proceeding to cycle #10 today. I set her up for repeat lab work later this week and to see me again in 28 days for cycle #11  She knows to call for any problems that may develop before that visit.   [name], MD   03/11/2016 7:47 PM

## 2016-03-12 ENCOUNTER — Ambulatory Visit (HOSPITAL_BASED_OUTPATIENT_CLINIC_OR_DEPARTMENT_OTHER): Payer: Medicare Other

## 2016-03-12 ENCOUNTER — Encounter: Payer: Self-pay | Admitting: Oncology

## 2016-03-12 VITALS — BP 162/65 | HR 97 | Temp 98.2°F

## 2016-03-12 DIAGNOSIS — Z5111 Encounter for antineoplastic chemotherapy: Secondary | ICD-10-CM | POA: Diagnosis not present

## 2016-03-12 DIAGNOSIS — C92Z0 Other myeloid leukemia not having achieved remission: Secondary | ICD-10-CM

## 2016-03-12 DIAGNOSIS — C9200 Acute myeloblastic leukemia, not having achieved remission: Secondary | ICD-10-CM

## 2016-03-12 DIAGNOSIS — D61818 Other pancytopenia: Secondary | ICD-10-CM

## 2016-03-12 MED ORDER — ONDANSETRON HCL 8 MG PO TABS
ORAL_TABLET | ORAL | Status: AC
  Filled 2016-03-12: qty 1

## 2016-03-12 MED ORDER — ONDANSETRON HCL 8 MG PO TABS
8.0000 mg | ORAL_TABLET | Freq: Once | ORAL | Status: AC
  Administered 2016-03-12: 8 mg via ORAL

## 2016-03-12 MED ORDER — AZACITIDINE CHEMO SQ INJECTION
75.0000 mg/m2 | Freq: Once | INTRAMUSCULAR | Status: AC
  Administered 2016-03-12: 140 mg via SUBCUTANEOUS
  Filled 2016-03-12: qty 5.6

## 2016-03-12 NOTE — Progress Notes (Signed)
Patient expressed financial concern at check-in. Gave my card to [facility] to give patient to have her stop by my office when done with treatment. Reviewed patient 's billing to see if assistance available. Found a charge in self-pay where patient should not have been billed. Emailed [place] in billing. Elmyra Ricks emailed back who states she wouldhave a Secretary/administrator through insurance and correct. Called patient to follow up and provide this information as well. She states she had someone coming to her home and did not have time to stop by and she was busy at the moment and could not talk. Asked patient if she still had my contact information to call me back at her earliest convenience. She states yes and thanked me for calling. Will also see if patient would like to apply for co-pay assistance through [facility] when she returns call. If so, will need to obtain household income information.

## 2016-03-13 ENCOUNTER — Ambulatory Visit (HOSPITAL_BASED_OUTPATIENT_CLINIC_OR_DEPARTMENT_OTHER): Payer: Medicare Other

## 2016-03-13 ENCOUNTER — Telehealth: Payer: Self-pay | Admitting: Oncology

## 2016-03-13 VITALS — BP 151/66 | HR 67 | Temp 97.4°F | Resp 18

## 2016-03-13 DIAGNOSIS — Z5111 Encounter for antineoplastic chemotherapy: Secondary | ICD-10-CM | POA: Diagnosis not present

## 2016-03-13 DIAGNOSIS — D61818 Other pancytopenia: Secondary | ICD-10-CM

## 2016-03-13 DIAGNOSIS — C92Z0 Other myeloid leukemia not having achieved remission: Secondary | ICD-10-CM | POA: Diagnosis not present

## 2016-03-13 DIAGNOSIS — C9200 Acute myeloblastic leukemia, not having achieved remission: Secondary | ICD-10-CM

## 2016-03-13 LAB — TISSUE HYBRIDIZATION (BONE MARROW)-NCBH

## 2016-03-13 LAB — CHROMOSOME ANALYSIS, BONE MARROW

## 2016-03-13 MED ORDER — AZACITIDINE CHEMO SQ INJECTION
75.0000 mg/m2 | Freq: Once | INTRAMUSCULAR | Status: AC
  Administered 2016-03-13: 140 mg via SUBCUTANEOUS
  Filled 2016-03-13: qty 5.6

## 2016-03-13 MED ORDER — ONDANSETRON HCL 8 MG PO TABS
8.0000 mg | ORAL_TABLET | Freq: Once | ORAL | Status: AC
  Administered 2016-03-13: 8 mg via ORAL

## 2016-03-13 MED ORDER — ONDANSETRON HCL 8 MG PO TABS
ORAL_TABLET | ORAL | Status: AC
  Filled 2016-03-13: qty 1

## 2016-03-13 NOTE — Patient Instructions (Signed)
[facility] Discharge Instructions for Patients Receiving Chemotherapy  Today you received the following chemotherapy agents Vidaza  To help prevent nausea and vomiting after your treatment, we encourage you to take your nausea medication as prescribed.    If you develop nausea and vomiting that is not controlled by your nausea medication, call the clinic.   BELOW ARE SYMPTOMS THAT SHOULD BE REPORTED IMMEDIATELY:  *FEVER GREATER THAN 100.5 F  *CHILLS WITH OR WITHOUT FEVER  NAUSEA AND VOMITING THAT IS NOT CONTROLLED WITH YOUR NAUSEA MEDICATION  *UNUSUAL SHORTNESS OF BREATH  *UNUSUAL BRUISING OR BLEEDING  TENDERNESS IN MOUTH AND THROAT WITH OR WITHOUT PRESENCE OF ULCERS  *URINARY PROBLEMS  *BOWEL PROBLEMS  UNUSUAL RASH Items with * indicate a potential emergency and should be followed up as soon as possible.  Feel free to call the clinic you have any questions or concerns. The clinic phone number is [phone].  Please show the CHEMO ALERT CARD at check-in to the Emergency Department and triage nurse.   

## 2016-03-13 NOTE — Telephone Encounter (Signed)
Faxed 02/29/16 path report to [facility]

## 2016-03-14 ENCOUNTER — Ambulatory Visit (HOSPITAL_BASED_OUTPATIENT_CLINIC_OR_DEPARTMENT_OTHER): Payer: Medicare Other

## 2016-03-14 VITALS — BP 144/75 | HR 71 | Temp 98.7°F | Resp 14

## 2016-03-14 DIAGNOSIS — Z5111 Encounter for antineoplastic chemotherapy: Secondary | ICD-10-CM

## 2016-03-14 DIAGNOSIS — C92Z0 Other myeloid leukemia not having achieved remission: Secondary | ICD-10-CM | POA: Diagnosis not present

## 2016-03-14 DIAGNOSIS — C9200 Acute myeloblastic leukemia, not having achieved remission: Secondary | ICD-10-CM

## 2016-03-14 DIAGNOSIS — D61818 Other pancytopenia: Secondary | ICD-10-CM

## 2016-03-14 MED ORDER — AZACITIDINE CHEMO SQ INJECTION
75.0000 mg/m2 | Freq: Once | INTRAMUSCULAR | Status: AC
  Administered 2016-03-14: 140 mg via SUBCUTANEOUS
  Filled 2016-03-14: qty 5.6

## 2016-03-14 MED ORDER — ONDANSETRON HCL 8 MG PO TABS
ORAL_TABLET | ORAL | Status: AC
  Filled 2016-03-14: qty 1

## 2016-03-14 MED ORDER — ONDANSETRON HCL 8 MG PO TABS
8.0000 mg | ORAL_TABLET | Freq: Once | ORAL | Status: AC
  Administered 2016-03-14: 8 mg via ORAL

## 2016-03-14 NOTE — Patient Instructions (Signed)
[facility] Discharge Instructions for Patients Receiving Chemotherapy  Today you received the following chemotherapy agents Vidaza  To help prevent nausea and vomiting after your treatment, we encourage you to take your nausea medication as prescribed.    If you develop nausea and vomiting that is not controlled by your nausea medication, call the clinic.   BELOW ARE SYMPTOMS THAT SHOULD BE REPORTED IMMEDIATELY:  *FEVER GREATER THAN 100.5 F  *CHILLS WITH OR WITHOUT FEVER  NAUSEA AND VOMITING THAT IS NOT CONTROLLED WITH YOUR NAUSEA MEDICATION  *UNUSUAL SHORTNESS OF BREATH  *UNUSUAL BRUISING OR BLEEDING  TENDERNESS IN MOUTH AND THROAT WITH OR WITHOUT PRESENCE OF ULCERS  *URINARY PROBLEMS  *BOWEL PROBLEMS  UNUSUAL RASH Items with * indicate a potential emergency and should be followed up as soon as possible.  Feel free to call the clinic you have any questions or concerns. The clinic phone number is [phone].  Please show the CHEMO ALERT CARD at check-in to the Emergency Department and triage nurse.   

## 2016-03-15 ENCOUNTER — Ambulatory Visit (HOSPITAL_BASED_OUTPATIENT_CLINIC_OR_DEPARTMENT_OTHER): Payer: Medicare Other

## 2016-03-15 ENCOUNTER — Ambulatory Visit: Payer: Medicare Other

## 2016-03-15 ENCOUNTER — Other Ambulatory Visit: Payer: Medicare Other

## 2016-03-15 ENCOUNTER — Other Ambulatory Visit (HOSPITAL_BASED_OUTPATIENT_CLINIC_OR_DEPARTMENT_OTHER): Payer: Medicare Other

## 2016-03-15 VITALS — BP 134/73 | HR 70 | Temp 98.0°F | Resp 18

## 2016-03-15 DIAGNOSIS — C9200 Acute myeloblastic leukemia, not having achieved remission: Secondary | ICD-10-CM

## 2016-03-15 DIAGNOSIS — D61818 Other pancytopenia: Secondary | ICD-10-CM

## 2016-03-15 DIAGNOSIS — D693 Immune thrombocytopenic purpura: Secondary | ICD-10-CM | POA: Diagnosis not present

## 2016-03-15 DIAGNOSIS — C92Z0 Other myeloid leukemia not having achieved remission: Secondary | ICD-10-CM

## 2016-03-15 DIAGNOSIS — Z5111 Encounter for antineoplastic chemotherapy: Secondary | ICD-10-CM | POA: Diagnosis not present

## 2016-03-15 DIAGNOSIS — D696 Thrombocytopenia, unspecified: Secondary | ICD-10-CM

## 2016-03-15 LAB — CBC WITH DIFFERENTIAL/PLATELET
BASO%: 1 % (ref 0.0–2.0)
Basophils Absolute: 0 10*3/uL (ref 0.0–0.1)
EOS%: 1.8 % (ref 0.0–7.0)
Eosinophils Absolute: 0.1 10*3/uL (ref 0.0–0.5)
HEMATOCRIT: 36.3 % (ref 34.8–46.6)
HEMOGLOBIN: 12 g/dL (ref 11.6–15.9)
LYMPH#: 0.9 10*3/uL (ref 0.9–3.3)
LYMPH%: 20.2 % (ref 14.0–49.7)
MCH: 31.2 pg (ref 25.1–34.0)
MCHC: 33 g/dL (ref 31.5–36.0)
MCV: 94.7 fL (ref 79.5–101.0)
MONO#: 0.4 10*3/uL (ref 0.1–0.9)
MONO%: 7.9 % (ref 0.0–14.0)
NEUT#: 3.2 10*3/uL (ref 1.5–6.5)
NEUT%: 69.1 % (ref 38.4–76.8)
Platelets: 261 10*3/uL (ref 145–400)
RBC: 3.84 10*6/uL (ref 3.70–5.45)
RDW: 16.7 % — AB (ref 11.2–14.5)
WBC: 4.6 10*3/uL (ref 3.9–10.3)

## 2016-03-15 MED ORDER — ONDANSETRON HCL 8 MG PO TABS
8.0000 mg | ORAL_TABLET | Freq: Once | ORAL | Status: AC
  Administered 2016-03-15: 8 mg via ORAL

## 2016-03-15 MED ORDER — ROMIPLOSTIM 250 MCG ~~LOC~~ SOLR
60.0000 ug | Freq: Once | SUBCUTANEOUS | Status: AC
  Administered 2016-03-15: 60 ug via SUBCUTANEOUS
  Filled 2016-03-15: qty 0.12

## 2016-03-15 MED ORDER — ONDANSETRON HCL 8 MG PO TABS
ORAL_TABLET | ORAL | Status: AC
  Filled 2016-03-15: qty 1

## 2016-03-15 MED ORDER — AZACITIDINE CHEMO SQ INJECTION
75.0000 mg/m2 | Freq: Once | INTRAMUSCULAR | Status: AC
  Administered 2016-03-15: 140 mg via SUBCUTANEOUS
  Filled 2016-03-15: qty 1.6

## 2016-03-15 NOTE — Patient Instructions (Signed)
[facility] Discharge Instructions for Patients Receiving Chemotherapy  Today you received the following chemotherapy agents Vidaza  To help prevent nausea and vomiting after your treatment, we encourage you to take your nausea medication as prescribed.    If you develop nausea and vomiting that is not controlled by your nausea medication, call the clinic.   BELOW ARE SYMPTOMS THAT SHOULD BE REPORTED IMMEDIATELY:  *FEVER GREATER THAN 100.5 F  *CHILLS WITH OR WITHOUT FEVER  NAUSEA AND VOMITING THAT IS NOT CONTROLLED WITH YOUR NAUSEA MEDICATION  *UNUSUAL SHORTNESS OF BREATH  *UNUSUAL BRUISING OR BLEEDING  TENDERNESS IN MOUTH AND THROAT WITH OR WITHOUT PRESENCE OF ULCERS  *URINARY PROBLEMS  *BOWEL PROBLEMS  UNUSUAL RASH Items with * indicate a potential emergency and should be followed up as soon as possible.  Feel free to call the clinic you have any questions or concerns. The clinic phone number is [phone].  Please show the CHEMO ALERT CARD at check-in to the Emergency Department and triage nurse.   

## 2016-03-18 ENCOUNTER — Encounter (HOSPITAL_COMMUNITY): Payer: Self-pay

## 2016-03-18 ENCOUNTER — Ambulatory Visit (HOSPITAL_BASED_OUTPATIENT_CLINIC_OR_DEPARTMENT_OTHER): Payer: Medicare Other

## 2016-03-18 VITALS — BP 158/75 | HR 72 | Temp 97.5°F | Resp 18

## 2016-03-18 DIAGNOSIS — C92Z0 Other myeloid leukemia not having achieved remission: Secondary | ICD-10-CM

## 2016-03-18 DIAGNOSIS — C9200 Acute myeloblastic leukemia, not having achieved remission: Secondary | ICD-10-CM

## 2016-03-18 DIAGNOSIS — Z5111 Encounter for antineoplastic chemotherapy: Secondary | ICD-10-CM

## 2016-03-18 DIAGNOSIS — D61818 Other pancytopenia: Secondary | ICD-10-CM

## 2016-03-18 MED ORDER — ONDANSETRON HCL 8 MG PO TABS
8.0000 mg | ORAL_TABLET | Freq: Once | ORAL | Status: AC
  Administered 2016-03-18: 8 mg via ORAL

## 2016-03-18 MED ORDER — ONDANSETRON HCL 8 MG PO TABS
ORAL_TABLET | ORAL | Status: AC
  Filled 2016-03-18: qty 1

## 2016-03-18 MED ORDER — AZACITIDINE CHEMO SQ INJECTION
75.0000 mg/m2 | Freq: Once | INTRAMUSCULAR | Status: AC
  Administered 2016-03-18: 140 mg via SUBCUTANEOUS
  Filled 2016-03-18: qty 5.6

## 2016-03-18 NOTE — Progress Notes (Signed)
Per Val RN per Dr. Jana Hakim okay to treat with Cmet from 03/11/16 and CBC from 03/15/16.

## 2016-03-18 NOTE — Patient Instructions (Signed)
[place] Discharge Instructions for Patients Receiving Chemotherapy  Today you received the following chemotherapy agents, Vidaza  To help prevent nausea and vomiting after your treatment, we encourage you to take your nausea medication as directed.  If you develop nausea and vomiting that is not controlled by your nausea medication, call the clinic.   BELOW ARE SYMPTOMS THAT SHOULD BE REPORTED IMMEDIATELY:  *FEVER GREATER THAN 100.5 F  *CHILLS WITH OR WITHOUT FEVER  NAUSEA AND VOMITING THAT IS NOT CONTROLLED WITH YOUR NAUSEA MEDICATION  *UNUSUAL SHORTNESS OF BREATH  *UNUSUAL BRUISING OR BLEEDING  TENDERNESS IN MOUTH AND THROAT WITH OR WITHOUT PRESENCE OF ULCERS  *URINARY PROBLEMS  *BOWEL PROBLEMS  UNUSUAL RASH Items with * indicate a potential emergency and should be followed up as soon as possible.  Feel free to call the clinic you have any questions or concerns. The clinic phone number is [phone].  Please show the [place] at check-in to the Emergency Department and triage nurse.

## 2016-03-19 ENCOUNTER — Ambulatory Visit (HOSPITAL_BASED_OUTPATIENT_CLINIC_OR_DEPARTMENT_OTHER): Payer: Medicare Other

## 2016-03-19 VITALS — BP 160/63 | HR 75 | Temp 98.1°F | Resp 17

## 2016-03-19 DIAGNOSIS — Z5111 Encounter for antineoplastic chemotherapy: Secondary | ICD-10-CM | POA: Diagnosis not present

## 2016-03-19 DIAGNOSIS — D61818 Other pancytopenia: Secondary | ICD-10-CM

## 2016-03-19 DIAGNOSIS — C92Z0 Other myeloid leukemia not having achieved remission: Secondary | ICD-10-CM | POA: Diagnosis not present

## 2016-03-19 DIAGNOSIS — C9200 Acute myeloblastic leukemia, not having achieved remission: Secondary | ICD-10-CM

## 2016-03-19 LAB — HM MAMMOGRAPHY

## 2016-03-19 MED ORDER — ONDANSETRON HCL 8 MG PO TABS
8.0000 mg | ORAL_TABLET | Freq: Once | ORAL | Status: AC
Start: 2016-03-19 — End: 2016-03-19
  Administered 2016-03-19: 8 mg via ORAL

## 2016-03-19 MED ORDER — ONDANSETRON HCL 8 MG PO TABS
ORAL_TABLET | ORAL | Status: AC
  Filled 2016-03-19: qty 1

## 2016-03-19 MED ORDER — AZACITIDINE CHEMO SQ INJECTION
75.0000 mg/m2 | Freq: Once | INTRAMUSCULAR | Status: AC
  Administered 2016-03-19: 140 mg via SUBCUTANEOUS
  Filled 2016-03-19: qty 1.6

## 2016-03-19 NOTE — Patient Instructions (Signed)
[facility] Discharge Instructions for Patients Receiving Chemotherapy  Today you received the following chemotherapy agents:  Vidaza (azacitidine)  To help prevent nausea and vomiting after your treatment, we encourage you to take your nausea medication as prescribed.   If you develop nausea and vomiting that is not controlled by your nausea medication, call the clinic.   BELOW ARE SYMPTOMS THAT SHOULD BE REPORTED IMMEDIATELY:  *FEVER GREATER THAN 100.5 F  *CHILLS WITH OR WITHOUT FEVER  NAUSEA AND VOMITING THAT IS NOT CONTROLLED WITH YOUR NAUSEA MEDICATION  *UNUSUAL SHORTNESS OF BREATH  *UNUSUAL BRUISING OR BLEEDING  TENDERNESS IN MOUTH AND THROAT WITH OR WITHOUT PRESENCE OF ULCERS  *URINARY PROBLEMS  *BOWEL PROBLEMS  UNUSUAL RASH Items with * indicate a potential emergency and should be followed up as soon as possible.  Feel free to call the clinic you have any questions or concerns. The clinic phone number is [phone].  Please show the CHEMO ALERT CARD at check-in to the Emergency Department and triage nurse.   

## 2016-03-25 ENCOUNTER — Encounter: Payer: Self-pay | Admitting: Family

## 2016-03-29 ENCOUNTER — Ambulatory Visit (HOSPITAL_BASED_OUTPATIENT_CLINIC_OR_DEPARTMENT_OTHER): Payer: Medicare Other

## 2016-03-29 ENCOUNTER — Ambulatory Visit (INDEPENDENT_AMBULATORY_CARE_PROVIDER_SITE_OTHER): Payer: Medicare Other

## 2016-03-29 VITALS — BP 152/69 | HR 76 | Temp 98.1°F | Resp 18

## 2016-03-29 DIAGNOSIS — C9200 Acute myeloblastic leukemia, not having achieved remission: Secondary | ICD-10-CM

## 2016-03-29 DIAGNOSIS — D693 Immune thrombocytopenic purpura: Secondary | ICD-10-CM

## 2016-03-29 DIAGNOSIS — C92Z0 Other myeloid leukemia not having achieved remission: Secondary | ICD-10-CM | POA: Diagnosis not present

## 2016-03-29 DIAGNOSIS — Z23 Encounter for immunization: Secondary | ICD-10-CM | POA: Diagnosis not present

## 2016-03-29 DIAGNOSIS — D61818 Other pancytopenia: Secondary | ICD-10-CM

## 2016-03-29 DIAGNOSIS — D696 Thrombocytopenia, unspecified: Secondary | ICD-10-CM

## 2016-03-29 LAB — CBC WITH DIFFERENTIAL/PLATELET
BASO%: 0.2 % (ref 0.0–2.0)
BASOS ABS: 0 10*3/uL (ref 0.0–0.1)
EOS ABS: 0.1 10*3/uL (ref 0.0–0.5)
EOS%: 1.8 % (ref 0.0–7.0)
HEMATOCRIT: 30.7 % — AB (ref 34.8–46.6)
HEMOGLOBIN: 10.4 g/dL — AB (ref 11.6–15.9)
LYMPH%: 19.2 % (ref 14.0–49.7)
MCH: 31.8 pg (ref 25.1–34.0)
MCHC: 33.9 g/dL (ref 31.5–36.0)
MCV: 93.9 fL (ref 79.5–101.0)
MONO#: 0.3 10*3/uL (ref 0.1–0.9)
MONO%: 4.9 % (ref 0.0–14.0)
NEUT#: 3.8 10*3/uL (ref 1.5–6.5)
NEUT%: 73.9 % (ref 38.4–76.8)
NRBC: 0 % (ref 0–0)
RBC: 3.27 10*6/uL — AB (ref 3.70–5.45)
RDW: 15.7 % — AB (ref 11.2–14.5)
WBC: 5.1 10*3/uL (ref 3.9–10.3)
lymph#: 1 10*3/uL (ref 0.9–3.3)

## 2016-03-29 LAB — CHCC SMEAR

## 2016-03-29 MED ORDER — ROMIPLOSTIM 250 MCG ~~LOC~~ SOLR
60.0000 ug | Freq: Once | SUBCUTANEOUS | Status: AC
  Administered 2016-03-29: 60 ug via SUBCUTANEOUS
  Filled 2016-03-29: qty 0.12

## 2016-04-03 NOTE — Progress Notes (Signed)
Per Dr. Jana Hakim, platelets do not need to be re-checked next week.  Pt can wait until appt on 10/16 for lab check.

## 2016-04-05 ENCOUNTER — Other Ambulatory Visit (INDEPENDENT_AMBULATORY_CARE_PROVIDER_SITE_OTHER): Payer: Medicare Other

## 2016-04-05 ENCOUNTER — Encounter: Payer: Self-pay | Admitting: Family

## 2016-04-05 ENCOUNTER — Ambulatory Visit (INDEPENDENT_AMBULATORY_CARE_PROVIDER_SITE_OTHER): Payer: Medicare Other | Admitting: Family

## 2016-04-05 VITALS — BP 122/68 | HR 76 | Temp 98.0°F | Resp 16 | Ht 68.0 in | Wt 141.0 lb

## 2016-04-05 DIAGNOSIS — Z0000 Encounter for general adult medical examination without abnormal findings: Secondary | ICD-10-CM

## 2016-04-05 DIAGNOSIS — C9201 Acute myeloblastic leukemia, in remission: Secondary | ICD-10-CM | POA: Diagnosis not present

## 2016-04-05 DIAGNOSIS — I10 Essential (primary) hypertension: Secondary | ICD-10-CM | POA: Diagnosis not present

## 2016-04-05 LAB — LIPID PANEL
CHOLESTEROL: 194 mg/dL (ref 0–200)
HDL: 62.7 mg/dL (ref >39.00)
LDL CALC: 114 mg/dL — AB (ref 0–99)
NonHDL: 131.52
Total CHOL/HDL Ratio: 3
Triglycerides: 89 mg/dL (ref 0.0–149.0)
VLDL: 17.8 mg/dL (ref 0.0–40.0)

## 2016-04-05 LAB — HEMOGLOBIN A1C: HEMOGLOBIN A1C: 5.3 % (ref 4.6–6.5)

## 2016-04-05 NOTE — Assessment & Plan Note (Signed)
Reviewed and updated patient's medical, surgical, family and social history. Medications and allergies were also reviewed. Basic screenings for depression, activities of daily living, hearing, cognition and safety were performed. Provider list was updated and health plan was provided to the patient.  

## 2016-04-05 NOTE — Assessment & Plan Note (Signed)
1) Anticipatory Guidance: Discussed importance of wearing a seatbelt while driving and not texting while driving; changing batteries in smoke detector at least once annually; wearing suntan lotion when outside; eating a balanced and moderate diet; getting physical activity at least 30 minutes per day.  2) Immunizations / Screenings / Labs:  Decline tetanus andt Zostavax today. All other immunizations are up to date per recommendations. Obtain Vitamin D for vitamin D deficiency screening. Due for dental and vision exam encouraged to be completed independently. Obtain A1c for diabetes screen. All other screenings are up to date per recommendations. Previous blood work reviewed with kidney, liver, and electrolyte function appearing stable. 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  Overall well exam with risk factors for cardiovascular disease including hypertension. Blood pressure appears medically controlled with current regimen and no adverse side effects. Does have a fall within the past year and declines recommendation for physical therapy evaluation for strengthening and balance improvement. 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 Continue other healthy lifestyle behaviors and choices. Follow up prevention exam in 1 year. Follow up office visit for chronic conditions pending blood work.

## 2016-04-05 NOTE — Patient Instructions (Signed)
Thank you for choosing [facility].  SUMMARY AND INSTRUCTIONS:  Medication:  Your prescription(s) have been submitted to your pharmacy or been printed and provided for you. Please take as directed and contact our office if you believe you are having problem(s) with the medication(s) or have any questions.  Labs:  Please stop by the lab on the lower level of the building for your blood work. Your results will be released to [facility] (or called to you) after review, usually within 72 hours after test completion. If any changes need to be made, you will be notified at that same time.  1.) The lab is open from 7:30am to 5:30 pm Monday-Friday 2.) No appointment is necessary 3.) Fasting (if needed) is 6-8 hours after food and drink; black coffee and water are okay   Follow up:  If your symptoms worsen or fail to improve, please contact our office for further instruction, or in case of emergency go directly to the emergency room at the closest medical facility.    Health Maintenance  Topic Date Due  . TETANUS/TDAP  04/11/1955  . ZOSTAVAX  04/11/1998  . INFLUENZA VACCINE  Completed  . DEXA SCAN  Completed  . PNA vac Low Risk Adult  Completed   Health Maintenance, Female Adopting a healthy lifestyle and getting preventive care can go a long way to promote health and wellness. Talk with your health care provider about what schedule of regular examinations is right for you. This is a good chance for you to check in with your provider about disease prevention and staying healthy. In between checkups, there are plenty of things you can do on your own. Experts have done a lot of research about which lifestyle changes and preventive measures are most likely to keep you healthy. Ask your health care provider for more information. WEIGHT AND DIET  Eat a healthy diet  Be sure to include plenty of vegetables, fruits, low-fat dairy products, and lean protein.  Do not eat a lot of foods high in  solid fats, added sugars, or salt.  Get regular exercise. This is one of the most important things you can do for your health.  Most adults should exercise for at least 150 minutes each week. The exercise should increase your heart rate and make you sweat (moderate-intensity exercise).  Most adults should also do strengthening exercises at least twice a week. This is in addition to the moderate-intensity exercise.  Maintain a healthy weight  Body mass index (BMI) is a measurement that can be used to identify possible weight problems. It estimates body fat based on height and weight. Your health care provider can help determine your BMI and help you achieve or maintain a healthy weight.  For females 30 years of age and older:   A BMI below 18.5 is considered underweight.  A BMI of 18.5 to 24.9 is normal.  A BMI of 25 to 29.9 is considered overweight.  A BMI of 30 and above is considered obese.  Watch levels of cholesterol and blood lipids  You should start having your blood tested for lipids and cholesterol at 78 years of age, then have this test every 5 years.  You may need to have your cholesterol levels checked more often if:  Your lipid or cholesterol levels are high.  You are older than 78 years of age.  You are at high risk for heart disease.  CANCER SCREENING   Lung Cancer  Lung cancer screening is recommended for  adults 81-82 years old who are at high risk for lung cancer because of a history of smoking.  A yearly low-dose CT scan of the lungs is recommended for people who:  Currently smoke.  Have quit within the past 15 years.  Have at least a 30-pack-year history of smoking. A pack year is smoking an average of one pack of cigarettes a day for 1 year.  Yearly screening should continue until it has been 15 years since you quit.  Yearly screening should stop if you develop a health problem that would prevent you from having lung cancer treatment.  Breast  Cancer  Practice breast self-awareness. This means understanding how your breasts normally appear and feel.  It also means doing regular breast self-exams. Let your health care provider know about any changes, no matter how small.  If you are in your 20s or 30s, you should have a clinical breast exam (CBE) by a health care provider every 1-3 years as part of a regular health exam.  If you are 40 or older, have a CBE every year. Also consider having a breast X-ray (mammogram) every year.  If you have a family history of breast cancer, talk to your health care provider about genetic screening.  If you are at high risk for breast cancer, talk to your health care provider about having an MRI and a mammogram every year.  Breast cancer gene (BRCA) assessment is recommended for women who have family members with BRCA-related cancers. BRCA-related cancers include:  Breast.  Ovarian.  Tubal.  Peritoneal cancers.  Results of the assessment will determine the need for genetic counseling and BRCA1 and BRCA2 testing. Cervical Cancer Your health care provider may recommend that you be screened regularly for cancer of the pelvic organs (ovaries, uterus, and vagina). This screening involves a pelvic examination, including checking for microscopic changes to the surface of your cervix (Pap test). You may be encouraged to have this screening done every 3 years, beginning at age 27.  For women ages 20-65, health care providers may recommend pelvic exams and Pap testing every 3 years, or they may recommend the Pap and pelvic exam, combined with testing for human papilloma virus (HPV), every 5 years. Some types of HPV increase your risk of cervical cancer. Testing for HPV may also be done on women of any age with unclear Pap test results.  Other health care providers may not recommend any screening for nonpregnant women who are considered low risk for pelvic cancer and who do not have symptoms. Ask your  health care provider if a screening pelvic exam is right for you.  If you have had past treatment for cervical cancer or a condition that could lead to cancer, you need Pap tests and screening for cancer for at least 20 years after your treatment. If Pap tests have been discontinued, your risk factors (such as having a new sexual partner) need to be reassessed to determine if screening should resume. Some women have medical problems that increase the chance of getting cervical cancer. In these cases, your health care provider may recommend more frequent screening and Pap tests. Colorectal Cancer  This type of cancer can be detected and often prevented.  Routine colorectal cancer screening usually begins at 78 years of age and continues through 78 years of age.  Your health care provider may recommend screening at an earlier age if you have risk factors for colon cancer.  Your health care provider may also recommend using home  test kits to check for hidden blood in the stool.  A small camera at the end of a tube can be used to examine your colon directly (sigmoidoscopy or colonoscopy). This is done to check for the earliest forms of colorectal cancer.  Routine screening usually begins at age 16.  Direct examination of the colon should be repeated every 5-10 years through 78 years of age. However, you may need to be screened more often if early forms of precancerous polyps or small growths are found. Skin Cancer  Check your skin from head to toe regularly.  Tell your health care provider about any new moles or changes in moles, especially if there is a change in a mole's shape or color.  Also tell your health care provider if you have a mole that is larger than the size of a pencil eraser.  Always use sunscreen. Apply sunscreen liberally and repeatedly throughout the day.  Protect yourself by wearing long sleeves, pants, a wide-brimmed hat, and sunglasses whenever you are outside. HEART  DISEASE, DIABETES, AND HIGH BLOOD PRESSURE   High blood pressure causes heart disease and increases the risk of stroke. High blood pressure is more likely to develop in:  People who have blood pressure in the high end of the normal range (130-139/85-89 mm Hg).  People who are overweight or obese.  People who are African American.  If you are 33-36 years of age, have your blood pressure checked every 3-5 years. If you are 76 years of age or older, have your blood pressure checked every year. You should have your blood pressure measured twice--once when you are at a hospital or clinic, and once when you are not at a hospital or clinic. Record the average of the two measurements. To check your blood pressure when you are not at a hospital or clinic, you can use:  An automated blood pressure machine at a pharmacy.  A home blood pressure monitor.  If you are between 62 years and 29 years old, ask your health care provider if you should take aspirin to prevent strokes.  Have regular diabetes screenings. This involves taking a blood sample to check your fasting blood sugar level.  If you are at a normal weight and have a low risk for diabetes, have this test once every three years after 78 years of age.  If you are overweight and have a high risk for diabetes, consider being tested at a younger age or more often. PREVENTING INFECTION  Hepatitis B  If you have a higher risk for hepatitis B, you should be screened for this virus. You are considered at high risk for hepatitis B if:  You were born in a country where hepatitis B is common. Ask your health care provider which countries are considered high risk.  Your parents were born in a high-risk country, and you have not been immunized against hepatitis B (hepatitis B vaccine).  You have HIV or AIDS.  You use needles to inject street drugs.  You live with someone who has hepatitis B.  You have had sex with someone who has hepatitis  B.  You get hemodialysis treatment.  You take certain medicines for conditions, including cancer, organ transplantation, and autoimmune conditions. Hepatitis C  Blood testing is recommended for:  Everyone born from [birth year] through 1963.  Anyone with known risk factors for hepatitis C. Sexually transmitted infections (STIs)  You should be screened for sexually transmitted infections (STIs) including gonorrhea and chlamydia if:  You are sexually active and are younger than 78 years of age.  You are older than 78 years of age and your health care provider tells you that you are at risk for this type of infection.  Your sexual activity has changed since you were last screened and you are at an increased risk for chlamydia or gonorrhea. Ask your health care provider if you are at risk.  If you do not have HIV, but are at risk, it may be recommended that you take a prescription medicine daily to prevent HIV infection. This is called pre-exposure prophylaxis (PrEP). You are considered at risk if:  You are sexually active and do not regularly use condoms or know the HIV status of your partner(s).  You take drugs by injection.  You are sexually active with a partner who has HIV. Talk with your health care provider about whether you are at high risk of being infected with HIV. If you choose to begin PrEP, you should first be tested for HIV. You should then be tested every 3 months for as long as you are taking PrEP.  PREGNANCY   If you are premenopausal and you may become pregnant, ask your health care provider about preconception counseling.  If you may become pregnant, take 400 to 800 micrograms (mcg) of folic acid every day.  If you want to prevent pregnancy, talk to your health care provider about birth control (contraception). OSTEOPOROSIS AND MENOPAUSE   Osteoporosis is a disease in which the bones lose minerals and strength with aging. This can result in serious bone fractures. Your  risk for osteoporosis can be identified using a bone density scan.  If you are 70 years of age or older, or if you are at risk for osteoporosis and fractures, ask your health care provider if you should be screened.  Ask your health care provider whether you should take a calcium or vitamin D supplement to lower your risk for osteoporosis.  Menopause may have certain physical symptoms and risks.  Hormone replacement therapy may reduce some of these symptoms and risks. Talk to your health care provider about whether hormone replacement therapy is right for you.  HOME CARE INSTRUCTIONS   Schedule regular health, dental, and eye exams.  Stay current with your immunizations.   Do not use any tobacco products including cigarettes, chewing tobacco, or electronic cigarettes.  If you are pregnant, do not drink alcohol.  If you are breastfeeding, limit how much and how often you drink alcohol.  Limit alcohol intake to no more than 1 drink per day for nonpregnant women. One drink equals 12 ounces of beer, 5 ounces of wine, or 1 ounces of hard liquor.  Do not use street drugs.  Do not share needles.  Ask your health care provider for help if you need support or information about quitting drugs.  Tell your health care provider if you often feel depressed.  Tell your health care provider if you have ever been abused or do not feel safe at home.   This information is not intended to replace advice given to you by your health care provider. Make sure you discuss any questions you have with your health care provider.   Document Released: 12/24/2010 Document Revised: 07/01/2014 Document Reviewed: 05/12/2013 Elsevier Interactive Patient Education [employer].

## 2016-04-05 NOTE — Progress Notes (Signed)
Subjective:    Patient ID: [name], female    DOB: April 09, 1936, 80 y.o.   MRN: [id number]  Chief Complaint  Patient presents with  . Follow-up    was diagnosed with leukemia since last office visit     HPI:  [name] is a 80 y.o. female who presents today for a Medicare Annual Wellness/Physical exam.    1) Health Maintenance -   Diet - Averages about 2 meals per day consisting of a regular diet. Caffeine intake 3-4 cups per day  Exercise - No structured exercise outside of activities of daily living   2) Preventative Exams / Immunizations:  Dental -- Due for exam  Vision -- Due for exam    Health Maintenance  Topic Date Due  . TETANUS/TDAP  04/11/1957  . ZOSTAVAX  04/11/1998  . INFLUENZA VACCINE  Completed  . DEXA SCAN  Completed  . PNA vac Low Risk Adult  Completed     Immunization History  Administered Date(s) Administered  . Influenza, High Dose Seasonal PF 03/29/2016  . Influenza-Unspecified 04/03/2015  . Pneumococcal Conjugate-13 03/29/2016  . Pneumococcal Polysaccharide-23 03/25/2007    RISK FACTORS  Tobacco History  Smoking Status  . Never Smoker  Smokeless Tobacco  . Never Used     Cardiac risk factors: advanced age (older than 25 for men, 60 for women) and hypertension.  Depression Screen  Depression screen [facility] 2/9 04/20/2015  Decreased Interest 3  Down, Depressed, Hopeless 3  PHQ - 2 Score 6  Altered sleeping 3  Tired, decreased energy 3  Change in appetite 0  Feeling bad or failure about yourself  3  Trouble concentrating 0  Moving slowly or fidgety/restless 0  Suicidal thoughts 1  PHQ-9 Score 16  Difficult doing work/chores Very difficult     Activities of Daily Living In your present state of health, do you have any difficulty performing the following activities?:  Driving? No Managing money?  No Feeding yourself? No Getting from bed to chair? No Climbing a flight of stairs? No Preparing food and  eating?: No Bathing or showering? No Getting dressed: No Getting to the toilet? No Using the toilet: No Moving around from place to place: No In the past year have you fallen or had a near fall?: Yes - secondary to neuropathy of feet. Ambulates with a cane   Home Safety Has smoke detector and wears seat belts. No firearms. No excess sun exposure. Are there smokers in your home (other than you)?  No Do you feel safe at home?  Yes  Hearing Difficulties: No Do you often ask people to speak up or repeat themselves? No Do you experience ringing or noises in your ears? No  Do you have difficulty understanding soft or whispered voices? No    Cognitive Testing  Alert? Yes   Normal Appearance? Yes  Oriented to person? Yes  Place? Yes   Time? Yes  Recall of three objects?  Yes  Can perform simple calculations? Yes  Displays appropriate judgment? Yes  Can read the correct time from a watch face? Yes  Do you feel that you have a problem with memory? No  Do you often misplace items? No   Advanced Directives have been discussed with the patient? Yes   Current Physicians/Providers and Suppliers  1. Terri Piedra, FNP - Internal Medicine 2. Lurline Del, MD - Oncology   Indicate any recent Medical Services you may have received from other than [facility] providers in  the past year (date may be approximate).  All answers were reviewed with the patient and necessary referrals were made:  [name], [place]   04/05/2016    Allergies  Allergen Reactions  . Cozaar [Losartan] Other (See Comments)    Cause pt to lose her taste for foods  . Codeine Itching and Other (See Comments)    Makes feel crazy  PT STATES SHE ALSO CAN NOT TAKE THE SYNTHETIC CODEINES  . Hydrocodone Itching    Tolerates with benadryl  . Oxycodone Itching  . Tetanus Toxoids Swelling and Rash     Outpatient Medications Prior to Visit  Medication Sig Dispense Refill  . amLODipine (NORVASC) 2.5 MG tablet TAKE ONE  TABLET BY MOUTH ONCE DAILY 90 tablet 5  . Cyanocobalamin 1000 MCG/ML KIT Inject 1,000 mcg as directed every 30 (thirty) days. B 12 shot 10 kit 1  . hydrochlorothiazide (HYDRODIURIL) 25 MG tablet Take 1 tablet (25 mg total) by mouth daily. Yearly physical due in Nov must see MD for refills 90 tablet 0  . Melatonin 5 MG TABS Take 2.5 mg by mouth at bedtime.    . Multiple Vitamin (MULTIVITAMIN WITH MINERALS) TABS tablet Take 1 tablet by mouth daily.    . pantoprazole (PROTONIX) 40 MG tablet Take 40 mg by mouth every morning.    . valACYclovir (VALTREX) 1000 MG tablet Take 1,000 mg by mouth 2 (two) times daily.     . Venlafaxine HCl 150 MG TB24 TAKE ONE TABLET BY MOUTH ONCE DAILY 30 tablet 5  . Vitamin D, Ergocalciferol, (DRISDOL) 50000 units CAPS capsule Take 1 capsule (50,000 Units total) by mouth See admin instructions. Every other week. 30 capsule 1  . hydrochlorothiazide (HYDRODIURIL) 25 MG tablet Take 1 tablet (25 mg total) by mouth daily. Yearly physical due in Nov must see MD for refills 90 tablet 0   No facility-administered medications prior to visit.      Past Medical History:  Diagnosis Date  . Anemia   . Anxiety   . Arthritis    "hand joints; hips; back" (05/23/2015)  . Cancer ([place])    dec. 2016-leukemia  . Chronic lower back pain   . Depression   . GERD (gastroesophageal reflux disease)   . History of blood transfusion 05/23/2015   "Hgb 5.9"  . Hypertension   . Shortness of breath dyspnea    with exertion     Past Surgical History:  Procedure Laterality Date  . Bone spur     R thigh  . CATARACT EXTRACTION W/ INTRAOCULAR LENS  IMPLANT, BILATERAL  ~ 2005  . DILATION AND CURETTAGE OF UTERUS    . ESOPHAGOGASTRODUODENOSCOPY (EGD) WITH PROPOFOL N/A 11/08/2014   Procedure: ESOPHAGOGASTRODUODENOSCOPY (EGD) WITH PROPOFOL;  Surgeon: Garlan Fair, MD;  Location: [facility];  Service: Endoscopy;  Laterality: N/A;  . JOINT REPLACEMENT    . TONSILLECTOMY  ~ 1950  . TOTAL  KNEE ARTHROPLASTY  04/27/2012   Procedure: TOTAL KNEE ARTHROPLASTY;  Surgeon: Mauri Pole, MD;  Location: [facility];  Service: Orthopedics;  Laterality: Right;  . TOTAL SHOULDER ARTHROPLASTY  09/13/2011   Procedure: TOTAL SHOULDER ARTHROPLASTY;  Surgeon: Augustin Schooling, MD;  Location: [place];  Service: Orthopedics;  Laterality: Left;  LEFT SHOULDER REVERSED TOTAL SHOULDER ARTHROPLASTY     Family History  Problem Relation Age of Onset  . Heart attack Mother 68    Died age 68  . Heart disease Brother     Atrial fib  Social History   Social History  . Marital status: Married    Spouse name: N/A  . Number of children: 3  . Years of education: 33   Occupational History  . Not on file.   Social History Main Topics  . Smoking status: Never Smoker  . Smokeless tobacco: Never Used  . Alcohol use No  . Drug use: No  . Sexual activity: Not Currently   Other Topics Concern  . Not on file   Social History Narrative   Retired Pharmacist, hospital   Denies abuse and feels safe at home    Review of Systems  Constitutional: Denies fever, chills, fatigue, or significant weight gain/loss. HENT: Head: Denies headache or neck pain Ears: Denies changes in hearing, ringing in ears, earache, drainage Nose: Denies discharge, stuffiness, itching, nosebleed, sinus pain Throat: Denies sore throat, hoarseness, dry mouth, sores, thrush Eyes: Denies loss/changes in vision, pain, redness, blurry/double vision, flashing lights Cardiovascular: Denies chest pain/discomfort, tightness, palpitations, shortness of breath with activity, difficulty lying down, swelling, sudden awakening with shortness of breath Respiratory: Denies shortness of breath, cough, sputum production, wheezing Gastrointestinal: Denies dysphasia, heartburn, change in appetite, nausea, change in bowel habits, rectal bleeding, constipation, diarrhea, yellow skin or eyes Genitourinary: Denies frequency, urgency, burning/pain, blood in urine,  incontinence, change in urinary strength. Musculoskeletal: Denies muscle/joint pain, stiffness, back pain, redness or swelling of joints, trauma Skin: Denies rashes, lumps, itching, dryness, color changes, or hair/nail changes Neurological: Denies dizziness, fainting, seizures, weakness, numbness, tingling, tremor Psychiatric - Denies nervousness, stress, depression or memory loss Endocrine: Denies heat or cold intolerance, sweating, frequent urination, excessive thirst, changes in appetite Hematologic: Denies ease of bruising or bleeding    Objective:     BP 122/68 (BP Location: Left Arm, Patient Position: Sitting, Cuff Size: Normal)   Pulse 76   Temp 98 F (36.7 C) (Oral)   Resp 16   Ht '5\' 8"'$  (1.727 m)   Wt 141 lb (64 kg)   SpO2 95%   BMI 21.44 kg/m  Nursing note and vital signs reviewed.  Physical Exam  Constitutional: She is oriented to person, place, and time. She appears well-developed and well-nourished.  HENT:  Head: Normocephalic.  Right Ear: Hearing, tympanic membrane, external ear and ear canal normal.  Left Ear: Hearing, tympanic membrane, external ear and ear canal normal.  Nose: Nose normal.  Mouth/Throat: Uvula is midline, oropharynx is clear and moist and mucous membranes are normal.  Eyes: Conjunctivae and EOM are normal. Pupils are equal, round, and reactive to light.  Neck: Neck supple. No JVD present. No tracheal deviation present. No thyromegaly present.  Cardiovascular: Normal rate, regular rhythm, normal heart sounds and intact distal pulses.   Pulmonary/Chest: Effort normal and breath sounds normal.  Abdominal: Soft. Bowel sounds are normal. She exhibits no distension and no mass. There is no tenderness. There is no rebound and no guarding.  Musculoskeletal: Normal range of motion. She exhibits no edema or tenderness.  Lymphadenopathy:    She has no cervical adenopathy.  Neurological: She is alert and oriented to person, place, and time. She has normal  reflexes. No cranial nerve deficit. She exhibits normal muscle tone. Coordination normal.  Skin: Skin is warm and dry.  Psychiatric: She has a normal mood and affect. Her behavior is normal. Judgment and thought content normal.       Assessment & Plan:   During the course of the visit the patient was educated and counseled about appropriate screening and preventive services  including:    Pneumococcal vaccine   Influenza vaccine  Diabetes screening  Glaucoma screening  Nutrition counseling   Diet review for nutrition referral? Yes ____  Not Indicated _X___   Patient Instructions (the written plan) was given to the patient.  Medicare Attestation I have personally reviewed: The patient's medical and social history Their use of alcohol, tobacco or illicit drugs Their current medications and supplements The patient's functional ability including ADLs,fall risks, home safety risks, cognitive, and hearing and visual impairment Diet and physical activities Evidence for depression or mood disorders  The patient's weight, height, BMI,  have been recorded in the chart.  I have made referrals, counseling, and provided education to the patient based on review of the above and I have provided the patient with a written personalized care plan for preventive services.     Problem List Items Addressed This Visit      Cardiovascular and Mediastinum   HTN (hypertension)    Hypertension adequately controlled with current regimen and no adverse wide effects. Continue to monitor.         Other   AML (acute myeloid leukemia) ([place])    Currently in remission and followed by oncology. Continue management and changes per oncology. Continue to monitor.       Routine general medical examination at a health care facility    1) Anticipatory Guidance: Discussed importance of wearing a seatbelt while driving and not texting while driving; changing batteries in smoke detector at least once  annually; wearing suntan lotion when outside; eating a balanced and moderate diet; getting physical activity at least 30 minutes per day.  2) Immunizations / Screenings / Labs:  Decline tetanus andt Zostavax today. All other immunizations are up to date per recommendations. Obtain Vitamin D for vitamin D deficiency screening. Due for dental and vision exam encouraged to be completed independently. Obtain A1c for diabetes screen. All other screenings are up to date per recommendations. Previous blood work reviewed with kidney, liver, and electrolyte function appearing stable. 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  Overall well exam with risk factors for cardiovascular disease including hypertension. Blood pressure appears medically controlled with current regimen and no adverse side effects. Does have a fall within the past year and declines recommendation for physical therapy evaluation for strengthening and balance improvement. She is the primary caregiver of her husband with multiple medical conditions and does receive some help from [facility]. Continue other healthy lifestyle behaviors and choices. Follow up prevention exam in 1 year. Follow up office visit for chronic conditions pending blood work.        Relevant Orders   Lipid panel   VITAMIN D 25 Hydroxy (Vit-D Deficiency, Fractures)   Hemoglobin A1c   Medicare annual wellness visit, subsequent - Primary    Reviewed and updated patient's medical, surgical, family and social history. Medications and allergies were also reviewed. Basic screenings for depression, activities of daily living, hearing, cognition and safety were performed. Provider list was updated and health plan was provided to the patient.        Other Visit Diagnoses   None.      I am having Ms. Kotch maintain her pantoprazole, multivitamin with minerals, Melatonin, valACYclovir, Vitamin D (Ergocalciferol), Cyanocobalamin, amLODipine, Venlafaxine  HCl, and hydrochlorothiazide.   Follow-up: Return in about 4 months (around 08/06/2016), or if symptoms worsen or fail to improve.   [name], FNP

## 2016-04-05 NOTE — Assessment & Plan Note (Signed)
Hypertension adequately controlled with current regimen and no adverse wide effects. Continue to monitor.

## 2016-04-05 NOTE — Assessment & Plan Note (Signed)
Currently in remission and followed by oncology. Continue management and changes per oncology. Continue to monitor.

## 2016-04-07 NOTE — Progress Notes (Signed)
[place]    NB: Jasmene did not show for her appointment and start of treatment in 04/08/2016 because of an emergency with her husband at home. We have moved the start of the current cycle of her treatment to 04/09/2016.    [phone] [phone]   ID: MALINA GEERS DOB: [birth year]  MR#: [id number]  CSN#:[id number]  Patient Care Team: [place], FNP as PCP - General (Family Medicine) PCP: [name], FNP, [name] GYN: SU:  OTHER MD: [name], [name], [name]  CHIEF COMPLAINT: Acute myeloid leukemia; ITP  CURRENT TREATMENT: Azacitidine, romiplostim  HISTORY OF PRESENT ILLNESS: From the 05/22/2025 consult note:  "The patient was evaluated at her PCP's office 05/22/2015 with a complaint of DOE worsening over several months. Labwork was obtained including a CBC, whioch showed WBC 1.8, platelets 14K, and Hb 5.9 with an MCV of 97.5. DDimer was 1.04. Accordingly the patient was referred to the ED and was admitted 05/23/2015.   Labwork since admission includes a repeat CBC with WBC 1.7, HB 5.4, MCV 100.6 and platelets 10K. Creat was 1.04 with GFR 50. Reticulocytes are not elevated with abs 39.3 (1.7%) and LDH is normal at 188. Other labs are pending.  The patient tells me because of peripheral neuropathy she has been receiving monthly B-12 shots since 2005."  Bone marrow biopsy 05/25/2015 showed acute myeloid leukemia, with 17% blasts by flow cytometry, 24% by aspirate counts, and 20-30% by immunohistochemistry ([id number], and 898). The blasts were positive for CD 117 and focally for MPO. There were also myelodysplasia related changes in all 3 cell lines.  The patient's subsequent history is as detailed below.  INTERVAL HISTORY:   REVIEW OF SYSTEMS:  PAST MEDICAL HISTORY: Past Medical History:  Diagnosis Date  . Anemia   . Anxiety   . Arthritis    "hand joints; hips; back" (05/23/2015)  . Cancer  ([place])    dec. 2016-leukemia  . Chronic lower back pain   . Depression   . GERD (gastroesophageal reflux disease)   . History of blood transfusion 05/23/2015   "Hgb 5.9"  . Hypertension   . Shortness of breath dyspnea    with exertion    PAST SURGICAL HISTORY: Past Surgical History:  Procedure Laterality Date  . Bone spur     R thigh  . CATARACT EXTRACTION W/ INTRAOCULAR LENS  IMPLANT, BILATERAL  ~ 2005  . DILATION AND CURETTAGE OF UTERUS    . ESOPHAGOGASTRODUODENOSCOPY (EGD) WITH PROPOFOL N/A 11/08/2014   Procedure: ESOPHAGOGASTRODUODENOSCOPY (EGD) WITH PROPOFOL;  Surgeon: Garlan Fair, MD;  [facility];  Service: Endoscopy;  Laterality: N/A;  . JOINT REPLACEMENT    . TONSILLECTOMY  ~ 1950  . TOTAL KNEE ARTHROPLASTY  04/27/2012   Procedure: TOTAL KNEE ARTHROPLASTY;  Surgeon: Mauri Pole, MD;  [facility];  Service: Orthopedics;  Laterality: Right;  . TOTAL SHOULDER ARTHROPLASTY  09/13/2011   Procedure: TOTAL SHOULDER ARTHROPLASTY;  Surgeon: Augustin Schooling, MD;  Location: [facility];  Service: Orthopedics;  Laterality: Left;  LEFT SHOULDER REVERSED TOTAL SHOULDER ARTHROPLASTY    FAMILY HISTORY Family History  Problem Relation Age of Onset  . Heart attack Mother 61    Died age 20  . Heart disease Brother     Atrial fib  Patient's father died age 27 with prostate cancer; mother died atv45 with an MI. One brother, alive at 105; one sister with parkinson's. No blood or cancer problems  otherwise in family  GYNECOLOGIC HISTORY:  No LMP recorded. Patient is postmenopausal. Menarche age 80, first live birth age 42, she is GXP3; menopause age 37, no HR  SOCIAL HISTORY:  Grade school Pharmacist, hospital, retired; husband was a Higher education careers adviser. He has severe heart disease and she is his main caretaker. Son [name] works for [employer] and sings in the [employer]; daughter Elgin lives in [place], a homemaker; daughter [name] works at the surgical center in [employer] as an Therapist, sports. The patient  has 7 gch. She is a Psychologist, forensic    ADVANCED DIRECTIVES: in place; the patient's son [name] is her Gould: Social History  Substance Use Topics  . Smoking status: Never Smoker  . Smokeless tobacco: Never Used  . Alcohol use No     Colonoscopy:  PAP:  Bone density:  Lipid panel:  Allergies  Allergen Reactions  . Cozaar [Losartan] Other (See Comments)    Cause pt to lose her taste for foods  . Codeine Itching and Other (See Comments)    Makes feel crazy  PT STATES SHE ALSO CAN NOT TAKE THE SYNTHETIC CODEINES  . Hydrocodone Itching    Tolerates with benadryl  . Oxycodone Itching  . Tetanus Toxoids Swelling and Rash    Current Outpatient Prescriptions  Medication Sig Dispense Refill  . amLODipine (NORVASC) 2.5 MG tablet TAKE ONE TABLET BY MOUTH ONCE DAILY 90 tablet 5  . Cyanocobalamin 1000 MCG/ML KIT Inject 1,000 mcg as directed every 30 (thirty) days. B 12 shot 10 kit 1  . hydrochlorothiazide (HYDRODIURIL) 25 MG tablet Take 1 tablet (25 mg total) by mouth daily. Yearly physical due in Nov must see MD for refills 90 tablet 0  . Melatonin 5 MG TABS Take 2.5 mg by mouth at bedtime.    . Multiple Vitamin (MULTIVITAMIN WITH MINERALS) TABS tablet Take 1 tablet by mouth daily.    . pantoprazole (PROTONIX) 40 MG tablet Take 40 mg by mouth every morning.    . valACYclovir (VALTREX) 1000 MG tablet Take 1,000 mg by mouth 2 (two) times daily.     . Venlafaxine HCl 150 MG TB24 TAKE ONE TABLET BY MOUTH ONCE DAILY 30 tablet 5  . Vitamin D, Ergocalciferol, (DRISDOL) 50000 units CAPS capsule Take 1 capsule (50,000 Units total) by mouth See admin instructions. Every other week. 30 capsule 1   No current facility-administered medications for this visit.     OBJECTIVE: Older white woman Who appears stated age There were no vitals filed for this visit.   There is no height or weight on file to calculate BMI.    ECOG FS:1 - Symptomatic but completely ambulatory   LAB  RESULTS:  CMP     Component Value Date/Time   NA 140 03/11/2016 1137   K 3.8 03/11/2016 1137   CL 103 02/29/2016 0820   CO2 29 03/11/2016 1137   GLUCOSE 79 03/11/2016 1137   BUN 13.7 03/11/2016 1137   CREATININE 0.8 03/11/2016 1137   CALCIUM 9.5 03/11/2016 1137   PROT 6.6 03/11/2016 1137   ALBUMIN 3.4 (L) 03/11/2016 1137   AST 21 03/11/2016 1137   ALT 15 03/11/2016 1137   ALKPHOS 111 03/11/2016 1137   BILITOT 0.36 03/11/2016 1137   GFRNONAA >60 02/29/2016 0820   GFRAA >60 02/29/2016 0820    INo results found for: SPEP, UPEP  Lab Results  Component Value Date   WBC 5.1 03/29/2016   NEUTROABS 3.8 03/29/2016   HGB 10.4 (L)  03/29/2016   HCT 30.7 (L) 03/29/2016   MCV 93.9 03/29/2016   PLT 78 Large platelets present (L) 03/29/2016      Chemistry      Component Value Date/Time   NA 140 03/11/2016 1137   K 3.8 03/11/2016 1137   CL 103 02/29/2016 0820   CO2 29 03/11/2016 1137   BUN 13.7 03/11/2016 1137   CREATININE 0.8 03/11/2016 1137      Component Value Date/Time   CALCIUM 9.5 03/11/2016 1137   ALKPHOS 111 03/11/2016 1137   AST 21 03/11/2016 1137   ALT 15 03/11/2016 1137   BILITOT 0.36 03/11/2016 1137       No results found for: LABCA2  No components found for: LABCA125  No results for input(s): INR in the last 168 hours.  Urinalysis    Component Value Date/Time   COLORURINE YELLOW 04/21/2012 0946   APPEARANCEUR CLOUDY (A) 04/21/2012 0946   LABSPEC 1.019 04/21/2012 0946   PHURINE 7.0 04/21/2012 0946   GLUCOSEU NEGATIVE 04/21/2012 0946   HGBUR NEGATIVE 04/21/2012 0946   BILIRUBINUR NEGATIVE 04/21/2012 0946   KETONESUR NEGATIVE 04/21/2012 0946   PROTEINUR NEGATIVE 04/21/2012 0946   UROBILINOGEN 1.0 04/21/2012 0946   NITRITE NEGATIVE 04/21/2012 0946   LEUKOCYTESUR NEGATIVE 04/21/2012 0946    STUDIES: Patient: EVANELL, REDLICH Collected: 02/29/2016 Client: [facility] Accession: [id number] Received: 02/29/2016 Corrie Mckusick, DO DOB:  06-30-1935 Age: 22 Gender: F Reported: 03/04/2016 [place] Patient Ph: [phone] MRN #: [id number] [place], [place] [address] Visit #: [id number] Chart #: Phone: [phone] Fax: CC: Lurline Del, MD BONE MARROW REPORT FINAL DIAGNOSIS Diagnosis Bone Marrow, Aspirate,Biopsy, and Clot - MILDLY HYPERCELLULAR MARROW WITH DYSPOIESIS. - NO INCREASE IN BLASTS. - SEE COMMENT. PERIPHERAL BLOOD: - MILD LEUKOPENIA. Diagnosis Note The marrow is mildly hypercellular with dysplasia in the erythroid and megakaryocytic lineages. There is no increase in blasts in the aspirate, by CD34, or by flow cytometry. Given the patient's history of acute myeloid leukemia with myelodysplasia related changes, the current findings likely represent reversion to an underlying myelodysplastic syndrome. Correlation with cytogenetics is recommended. Vicente Males MD Pathologist, Electronic Signature (Case signed 03/04/2016) GROSS AND MICROSCOPIC INFORMATION  No results found. .  ASSESSMENT: 80 y.o. [place] woman with acute myeloid leukemia diagnosed by bone marrow biopsy 05/25/2015, with the blast count between 17 and 30% depending on the method of determination.   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, with gain of 2 and 19  (b) consider allogenic transplant  (c) all transfusion products to be irradiated  (1) started sQ azacitidine 06/05/2015, receiving 7 doses every 28 day cycle  (a) repeat bone marrow biopsy 02/29/2016 showed a mildly hypercellular marrow with dyspoiesis, but no increase in blasts either histologically or by flow cytometry  (2) immune thrombocytopenic purpura  (a) s/p IVIG 07/06/2015 (1g/kg x 2d) with excellent response  (b) romiplostim first dose 07/07/2015, started weekly as of 08/04/2015  (3) biopsy of an enlarged right axillary lymph node at [facility] 11/13/2015 was nondiagnostic.  PLAN:    [name], MD   04/07/2016 11:32 AM

## 2016-04-08 ENCOUNTER — Telehealth: Payer: Self-pay | Admitting: *Deleted

## 2016-04-08 ENCOUNTER — Ambulatory Visit: Payer: Medicare Other | Admitting: Oncology

## 2016-04-08 ENCOUNTER — Other Ambulatory Visit: Payer: Medicare Other

## 2016-04-08 ENCOUNTER — Inpatient Hospital Stay: Payer: Medicare Other

## 2016-04-08 LAB — VITAMIN D 25 HYDROXY (VIT D DEFICIENCY, FRACTURES): VITD: 69.32 ng/mL (ref 30.00–100.00)

## 2016-04-08 NOTE — Telephone Encounter (Signed)
"  I have three appointments today I need to cancel.  I've called 911 for my husband who's not breathing well and can't get in the car.  Please tell them I can't come in for lab, Dr, Jana Hakim and vidaza."   Advised she call to reschedule.  "I'll be tere tomorrow because he will be admitted."    Noted cancellation for today only.

## 2016-04-09 ENCOUNTER — Ambulatory Visit (HOSPITAL_BASED_OUTPATIENT_CLINIC_OR_DEPARTMENT_OTHER): Payer: Medicare Other

## 2016-04-09 VITALS — BP 123/78 | HR 75 | Temp 98.2°F | Resp 18

## 2016-04-09 DIAGNOSIS — Z5111 Encounter for antineoplastic chemotherapy: Secondary | ICD-10-CM | POA: Diagnosis not present

## 2016-04-09 DIAGNOSIS — C9200 Acute myeloblastic leukemia, not having achieved remission: Secondary | ICD-10-CM

## 2016-04-09 DIAGNOSIS — D61818 Other pancytopenia: Secondary | ICD-10-CM

## 2016-04-09 LAB — COMPREHENSIVE METABOLIC PANEL
ALBUMIN: 3.6 g/dL (ref 3.5–5.0)
ALK PHOS: 89 U/L (ref 40–150)
ALT: 12 U/L (ref 0–55)
ANION GAP: 10 meq/L (ref 3–11)
AST: 17 U/L (ref 5–34)
BUN: 15.3 mg/dL (ref 7.0–26.0)
CALCIUM: 9.4 mg/dL (ref 8.4–10.4)
CHLORIDE: 103 meq/L (ref 98–109)
CO2: 27 mEq/L (ref 22–29)
CREATININE: 0.9 mg/dL (ref 0.6–1.1)
EGFR: 59 mL/min/{1.73_m2} — ABNORMAL LOW (ref >90)
Glucose: 78 mg/dl (ref 70–140)
POTASSIUM: 3.2 meq/L — AB (ref 3.5–5.1)
Sodium: 139 mEq/L (ref 136–145)
Total Bilirubin: 0.47 mg/dL (ref 0.20–1.20)
Total Protein: 6.9 g/dL (ref 6.4–8.3)

## 2016-04-09 LAB — CBC WITH DIFFERENTIAL/PLATELET
BASO%: 1.1 % (ref 0.0–2.0)
BASOS ABS: 0 10*3/uL (ref 0.0–0.1)
EOS ABS: 0.1 10*3/uL (ref 0.0–0.5)
EOS%: 1.8 % (ref 0.0–7.0)
HCT: 35 % (ref 34.8–46.6)
HEMOGLOBIN: 11.5 g/dL — AB (ref 11.6–15.9)
LYMPH%: 33.7 % (ref 14.0–49.7)
MCH: 31.3 pg (ref 25.1–34.0)
MCHC: 32.9 g/dL (ref 31.5–36.0)
MCV: 95.1 fL (ref 79.5–101.0)
MONO#: 0.1 10*3/uL (ref 0.1–0.9)
MONO%: 3.5 % (ref 0.0–14.0)
NEUT#: 1.7 10*3/uL (ref 1.5–6.5)
NEUT%: 59.9 % (ref 38.4–76.8)
Platelets: 167 10*3/uL (ref 145–400)
RBC: 3.68 10*6/uL — ABNORMAL LOW (ref 3.70–5.45)
RDW: 16.7 % — AB (ref 11.2–14.5)
WBC: 2.8 10*3/uL — ABNORMAL LOW (ref 3.9–10.3)
lymph#: 1 10*3/uL (ref 0.9–3.3)

## 2016-04-09 LAB — CHCC SMEAR

## 2016-04-09 MED ORDER — ONDANSETRON HCL 8 MG PO TABS
8.0000 mg | ORAL_TABLET | Freq: Once | ORAL | Status: AC
  Administered 2016-04-09: 8 mg via ORAL

## 2016-04-09 MED ORDER — AZACITIDINE CHEMO SQ INJECTION
75.0000 mg/m2 | Freq: Once | INTRAMUSCULAR | Status: AC
  Administered 2016-04-09: 140 mg via SUBCUTANEOUS
  Filled 2016-04-09: qty 5.6

## 2016-04-09 MED ORDER — ONDANSETRON HCL 8 MG PO TABS
ORAL_TABLET | ORAL | Status: AC
  Filled 2016-04-09: qty 1

## 2016-04-09 NOTE — Patient Instructions (Signed)
[facility] Discharge Instructions for Patients Receiving Chemotherapy  Today you received the following chemotherapy agents Vidaza  To help prevent nausea and vomiting after your treatment, we encourage you to take your nausea medication   If you develop nausea and vomiting that is not controlled by your nausea medication, call the clinic.   BELOW ARE SYMPTOMS THAT SHOULD BE REPORTED IMMEDIATELY:  *FEVER GREATER THAN 100.5 F  *CHILLS WITH OR WITHOUT FEVER  NAUSEA AND VOMITING THAT IS NOT CONTROLLED WITH YOUR NAUSEA MEDICATION  *UNUSUAL SHORTNESS OF BREATH  *UNUSUAL BRUISING OR BLEEDING  TENDERNESS IN MOUTH AND THROAT WITH OR WITHOUT PRESENCE OF ULCERS  *URINARY PROBLEMS  *BOWEL PROBLEMS  UNUSUAL RASH Items with * indicate a potential emergency and should be followed up as soon as possible.  Feel free to call the clinic you have any questions or concerns. The clinic phone number is [phone].  Please show the CHEMO ALERT CARD at check-in to the Emergency Department and triage nurse.   

## 2016-04-10 ENCOUNTER — Ambulatory Visit (HOSPITAL_BASED_OUTPATIENT_CLINIC_OR_DEPARTMENT_OTHER): Payer: Medicare Other

## 2016-04-10 VITALS — BP 154/68 | HR 76 | Temp 97.8°F | Resp 18

## 2016-04-10 DIAGNOSIS — Z5111 Encounter for antineoplastic chemotherapy: Secondary | ICD-10-CM

## 2016-04-10 DIAGNOSIS — C9200 Acute myeloblastic leukemia, not having achieved remission: Secondary | ICD-10-CM

## 2016-04-10 DIAGNOSIS — D61818 Other pancytopenia: Secondary | ICD-10-CM

## 2016-04-10 MED ORDER — ONDANSETRON HCL 8 MG PO TABS
ORAL_TABLET | ORAL | Status: AC
  Filled 2016-04-10: qty 1

## 2016-04-10 MED ORDER — AZACITIDINE CHEMO SQ INJECTION
75.0000 mg/m2 | Freq: Once | INTRAMUSCULAR | Status: AC
  Administered 2016-04-10: 140 mg via SUBCUTANEOUS
  Filled 2016-04-10: qty 5.6

## 2016-04-10 MED ORDER — ONDANSETRON HCL 8 MG PO TABS
8.0000 mg | ORAL_TABLET | Freq: Once | ORAL | Status: AC
  Administered 2016-04-10: 8 mg via ORAL

## 2016-04-10 NOTE — Patient Instructions (Signed)
[facility] Discharge Instructions for Patients Receiving Chemotherapy  Today you received the following chemotherapy agents: Vidaza   To help prevent nausea and vomiting after your treatment, we encourage you to take your nausea medication as directed.    If you develop nausea and vomiting that is not controlled by your nausea medication, call the clinic.   BELOW ARE SYMPTOMS THAT SHOULD BE REPORTED IMMEDIATELY:  *FEVER GREATER THAN 100.5 F  *CHILLS WITH OR WITHOUT FEVER  NAUSEA AND VOMITING THAT IS NOT CONTROLLED WITH YOUR NAUSEA MEDICATION  *UNUSUAL SHORTNESS OF BREATH  *UNUSUAL BRUISING OR BLEEDING  TENDERNESS IN MOUTH AND THROAT WITH OR WITHOUT PRESENCE OF ULCERS  *URINARY PROBLEMS  *BOWEL PROBLEMS  UNUSUAL RASH Items with * indicate a potential emergency and should be followed up as soon as possible.  Feel free to call the clinic you have any questions or concerns. The clinic phone number is [phone].  Please show the CHEMO ALERT CARD at check-in to the Emergency Department and triage nurse.   

## 2016-04-11 ENCOUNTER — Inpatient Hospital Stay: Payer: Medicare Other

## 2016-04-11 ENCOUNTER — Telehealth: Payer: Self-pay | Admitting: Oncology

## 2016-04-11 NOTE — Telephone Encounter (Signed)
10/19 and 10/20 appointments canceled per patient request. The patient called to cancel because her husband passed on 05/09/2023.

## 2016-04-12 ENCOUNTER — Other Ambulatory Visit: Payer: Medicare Other

## 2016-04-12 ENCOUNTER — Other Ambulatory Visit: Payer: Self-pay | Admitting: *Deleted

## 2016-04-12 ENCOUNTER — Inpatient Hospital Stay: Payer: Medicare Other

## 2016-04-12 ENCOUNTER — Ambulatory Visit (HOSPITAL_BASED_OUTPATIENT_CLINIC_OR_DEPARTMENT_OTHER): Payer: Medicare Other

## 2016-04-12 DIAGNOSIS — C9200 Acute myeloblastic leukemia, not having achieved remission: Secondary | ICD-10-CM | POA: Diagnosis not present

## 2016-04-12 DIAGNOSIS — D61818 Other pancytopenia: Secondary | ICD-10-CM

## 2016-04-12 DIAGNOSIS — D696 Thrombocytopenia, unspecified: Secondary | ICD-10-CM

## 2016-04-12 LAB — CBC WITH DIFFERENTIAL/PLATELET
BASO%: 0.6 % (ref 0.0–2.0)
BASOS ABS: 0 10*3/uL (ref 0.0–0.1)
EOS%: 1.7 % (ref 0.0–7.0)
Eosinophils Absolute: 0.1 10*3/uL (ref 0.0–0.5)
HEMATOCRIT: 33.1 % — AB (ref 34.8–46.6)
HEMOGLOBIN: 10.9 g/dL — AB (ref 11.6–15.9)
LYMPH#: 0.9 10*3/uL (ref 0.9–3.3)
LYMPH%: 27.6 % (ref 14.0–49.7)
MCH: 31.5 pg (ref 25.1–34.0)
MCHC: 32.8 g/dL (ref 31.5–36.0)
MCV: 96.1 fL (ref 79.5–101.0)
MONO#: 0.1 10*3/uL (ref 0.1–0.9)
MONO%: 4.8 % (ref 0.0–14.0)
NEUT#: 2 10*3/uL (ref 1.5–6.5)
NEUT%: 65.3 % (ref 38.4–76.8)
Platelets: 175 10*3/uL (ref 145–400)
RBC: 3.45 10*6/uL — ABNORMAL LOW (ref 3.70–5.45)
RDW: 17.5 % — AB (ref 11.2–14.5)
WBC: 3.1 10*3/uL — ABNORMAL LOW (ref 3.9–10.3)

## 2016-04-12 MED ORDER — ROMIPLOSTIM 250 MCG ~~LOC~~ SOLR
60.0000 ug | Freq: Once | SUBCUTANEOUS | Status: DC
Start: 2016-04-12 — End: 2016-08-19
  Filled 2016-04-12: qty 0.12

## 2016-04-15 ENCOUNTER — Inpatient Hospital Stay: Payer: Medicare Other

## 2016-04-15 ENCOUNTER — Other Ambulatory Visit: Payer: Self-pay | Admitting: Oncology

## 2016-04-16 ENCOUNTER — Inpatient Hospital Stay: Payer: Medicare Other

## 2016-04-16 ENCOUNTER — Telehealth: Payer: Self-pay | Admitting: Oncology

## 2016-04-16 NOTE — Telephone Encounter (Signed)
Per pt to cancel 10/27 appts per LOS due to being out of town for husbands funeral services

## 2016-04-19 ENCOUNTER — Inpatient Hospital Stay: Payer: Medicare Other

## 2016-04-19 ENCOUNTER — Other Ambulatory Visit: Payer: Medicare Other

## 2016-04-26 ENCOUNTER — Other Ambulatory Visit (HOSPITAL_BASED_OUTPATIENT_CLINIC_OR_DEPARTMENT_OTHER): Payer: Medicare Other

## 2016-04-26 ENCOUNTER — Other Ambulatory Visit: Payer: Medicare Other

## 2016-04-26 ENCOUNTER — Ambulatory Visit (HOSPITAL_BASED_OUTPATIENT_CLINIC_OR_DEPARTMENT_OTHER): Payer: Medicare Other | Admitting: Oncology

## 2016-04-26 VITALS — BP 156/65 | HR 79 | Temp 97.6°F | Resp 18 | Ht 68.0 in | Wt 140.1 lb

## 2016-04-26 DIAGNOSIS — D693 Immune thrombocytopenic purpura: Secondary | ICD-10-CM | POA: Diagnosis not present

## 2016-04-26 DIAGNOSIS — C9200 Acute myeloblastic leukemia, not having achieved remission: Secondary | ICD-10-CM

## 2016-04-26 DIAGNOSIS — D61818 Other pancytopenia: Secondary | ICD-10-CM

## 2016-04-26 DIAGNOSIS — D696 Thrombocytopenia, unspecified: Secondary | ICD-10-CM

## 2016-04-26 LAB — CBC WITH DIFFERENTIAL/PLATELET
BASO%: 0.6 % (ref 0.0–2.0)
Basophils Absolute: 0 10*3/uL (ref 0.0–0.1)
EOS%: 2.2 % (ref 0.0–7.0)
Eosinophils Absolute: 0.1 10*3/uL (ref 0.0–0.5)
HCT: 35.2 % (ref 34.8–46.6)
HGB: 11.5 g/dL — ABNORMAL LOW (ref 11.6–15.9)
LYMPH%: 27.8 % (ref 14.0–49.7)
MCH: 32 pg (ref 25.1–34.0)
MCHC: 32.8 g/dL (ref 31.5–36.0)
MCV: 97.6 fL (ref 79.5–101.0)
MONO#: 0.3 10*3/uL (ref 0.1–0.9)
MONO%: 6.4 % (ref 0.0–14.0)
NEUT#: 2.5 10*3/uL (ref 1.5–6.5)
NEUT%: 63 % (ref 38.4–76.8)
Platelets: 99 10*3/uL — ABNORMAL LOW (ref 145–400)
RBC: 3.61 10*6/uL — ABNORMAL LOW (ref 3.70–5.45)
RDW: 19.4 % — ABNORMAL HIGH (ref 11.2–14.5)
WBC: 3.9 10*3/uL (ref 3.9–10.3)
lymph#: 1.1 10*3/uL (ref 0.9–3.3)

## 2016-04-26 LAB — TECHNOLOGIST REVIEW

## 2016-04-26 LAB — CHCC SMEAR

## 2016-04-26 MED ORDER — ROMIPLOSTIM 250 MCG ~~LOC~~ SOLR
60.0000 ug | Freq: Once | SUBCUTANEOUS | Status: AC
  Administered 2016-04-26: 60 ug via SUBCUTANEOUS
  Filled 2016-04-26: qty 0.12

## 2016-04-26 MED ORDER — PANTOPRAZOLE SODIUM 40 MG PO TBEC: 40.0000 mg | DELAYED_RELEASE_TABLET | Freq: Every morning | ORAL | 4 refills | Status: DC

## 2016-04-26 NOTE — Progress Notes (Addendum)
[name]    NB: [name] did not show for her appointment and start of treatment in 04/08/2016 because of an emergency with her husband at home. We have moved the start of the current cycle of her treatment to 04/09/2016.    [phone] [phone]   ID: BERLIN VIERECK DOB: 1936-03-31  MR#: [id number]  GYI#:[id number]  Patient Care Team: [facility] Circle, FNP as PCP - General (Family Medicine) PCP: [name], FNP, [name] GYN: SU:  OTHER MD: [name], [name], [name]  CHIEF COMPLAINT: Acute myeloid leukemia; ITP  CURRENT TREATMENT: Azacitidine, romiplostim  HISTORY OF PRESENT ILLNESS: From the 05/22/2025 consult note:  "The patient was evaluated at her PCP's office 05/22/2015 with a complaint of DOE worsening over several months. Labwork was obtained including a CBC, whioch showed WBC 1.8, platelets 14K, and Hb 5.9 with an MCV of 97.5. DDimer was 1.04. Accordingly the patient was referred to the ED and was admitted 05/23/2015.   Labwork since admission includes a repeat CBC with WBC 1.7, HB 5.4, MCV 100.6 and platelets 10K. Creat was 1.04 with GFR 50. Reticulocytes are not elevated with abs 39.3 (1.7%) and LDH is normal at 188. Other labs are pending.  The patient tells me because of peripheral neuropathy she has been receiving monthly B-12 shots since 2005."  Bone marrow biopsy 05/25/2015 showed acute myeloid leukemia, with 17% blasts by flow cytometry, 24% by aspirate counts, and 20-30% by immunohistochemistry ([id number], and [id number]). The blasts were positive for CD 117 and focally for MPO. There were also myelodysplasia related changes in all 3 cell lines.  The patient's subsequent history is as detailed below.  INTERVAL HISTORY: [name] returns today for follow-up of her acute leukemia/myelodysplasia. Her most recent treatment with azacitabine was interrupted by her husband's death Apr 24, 2016. He was cared  for at the [facility]. He had been in [employer], police department, and received a 21 consult lived. She tells me the service was wonderful.  REVIEW OF SYSTEMS: She is grieving appropriately. She has mild insomnia. She has a little bit of a runny nose. She is short of breath when walking any significant distance. She continues to have stress urinary incontinence. She bruises easily. She has mild back pain. She feels forgetful. A detailed review of systems today was otherwise stable.  PAST MEDICAL HISTORY: Past Medical History:  Diagnosis Date  . Anemia   . Anxiety   . Arthritis    "hand joints; hips; back" (05/23/2015)  . Cancer ([place])    dec. 2016-leukemia  . Chronic lower back pain   . Depression   . GERD (gastroesophageal reflux disease)   . History of blood transfusion 05/23/2015   "Hgb 5.9"  . Hypertension   . Shortness of breath dyspnea    with exertion    PAST SURGICAL HISTORY: Past Surgical History:  Procedure Laterality Date  . Bone spur     R thigh  . CATARACT EXTRACTION W/ INTRAOCULAR LENS  IMPLANT, BILATERAL  ~ 2005  . DILATION AND CURETTAGE OF UTERUS    . ESOPHAGOGASTRODUODENOSCOPY (EGD) WITH PROPOFOL N/A 11/08/2014   Procedure: ESOPHAGOGASTRODUODENOSCOPY (EGD) WITH PROPOFOL;  Surgeon: Garlan Fair, MD;  Location: [facility];  Service: Endoscopy;  Laterality: N/A;  . JOINT REPLACEMENT    . TONSILLECTOMY  ~ 1950  . TOTAL KNEE ARTHROPLASTY  04/27/2012   Procedure: TOTAL KNEE ARTHROPLASTY;  Surgeon: Mauri Pole, MD;  Location: [facility];  Service:  Orthopedics;  Laterality: Right;  . TOTAL SHOULDER ARTHROPLASTY  09/13/2011   Procedure: TOTAL SHOULDER ARTHROPLASTY;  Surgeon: Augustin Schooling, MD;  Location: [facility];  Service: Orthopedics;  Laterality: Left;  LEFT SHOULDER REVERSED TOTAL SHOULDER ARTHROPLASTY    FAMILY HISTORY Family History  Problem Relation Age of Onset  . Heart attack Mother 73    Died age 36  . Heart disease Brother     Atrial fib    Patient's father died age 63 with prostate cancer; mother died atv28 with an MI. One brother, alive at 11; one sister with parkinson's. No blood or cancer problems otherwise in family  GYNECOLOGIC HISTORY:  No LMP recorded. Patient is postmenopausal. Menarche age 80, first live birth age 80, she is GXP3; menopause age 80, no HR  SOCIAL HISTORY:  Grade school Pharmacist, hospital, retired; husband was a Higher education careers adviser. He has severe heart disease and she is his main caretaker. Son [name] works for [employer] and sings in the [employer]; daughter Sussex lives in [place], a homemaker; daughter [name] works at the surgical center in [employer] as an Therapist, sports. The patient has 7 gch. She is a Psychologist, forensic    ADVANCED DIRECTIVES: in place; the patient's son [name] is her [place]: Social History  Substance Use Topics  . Smoking status: Never Smoker  . Smokeless tobacco: Never Used  . Alcohol use No     Colonoscopy:  PAP:  Bone density:  Lipid panel:  Allergies  Allergen Reactions  . Cozaar [Losartan] Other (See Comments)    Cause pt to lose her taste for foods  . Codeine Itching and Other (See Comments)    Makes feel crazy  PT STATES SHE ALSO CAN NOT TAKE THE SYNTHETIC CODEINES  . Hydrocodone Itching    Tolerates with benadryl  . Oxycodone Itching  . Tetanus Toxoids Swelling and Rash    Current Outpatient Prescriptions  Medication Sig Dispense Refill  . amLODipine (NORVASC) 2.5 MG tablet TAKE ONE TABLET BY MOUTH ONCE DAILY 90 tablet 5  . Cyanocobalamin 1000 MCG/ML KIT Inject 1,000 mcg as directed every 30 (thirty) days. B 12 shot 10 kit 1  . hydrochlorothiazide (HYDRODIURIL) 25 MG tablet Take 1 tablet (25 mg total) by mouth daily. Yearly physical due in Nov must see MD for refills 90 tablet 0  . Melatonin 5 MG TABS Take 2.5 mg by mouth at bedtime.    . Multiple Vitamin (MULTIVITAMIN WITH MINERALS) TABS tablet Take 1 tablet by mouth daily.    . pantoprazole (PROTONIX) 40 MG tablet Take 1  tablet (40 mg total) by mouth every morning. 90 tablet 4  . valACYclovir (VALTREX) 1000 MG tablet Take 1,000 mg by mouth 2 (two) times daily.     . Venlafaxine HCl 150 MG TB24 TAKE ONE TABLET BY MOUTH ONCE DAILY 30 tablet 5  . Vitamin D, Ergocalciferol, (DRISDOL) 50000 units CAPS capsule Take 1 capsule (50,000 Units total) by mouth See admin instructions. Every other week. 30 capsule 1   No current facility-administered medications for this visit.    Facility-Administered Medications Ordered in Other Visits  Medication Dose Route Frequency Provider Last Rate Last Dose  . romiPLOStim (NPLATE) injection 60 mcg  60 mcg Subcutaneous Once [name], MD        OBJECTIVE: Older white womanIn no acute distress Vitals:   04/26/16 1158  BP: (!) 156/65  Pulse: 79  Resp: 18  Temp: 97.6 F (36.4 C)  Body mass index is 21.3 kg/m.    ECOG FS:1 - Symptomatic but completely ambulatory  Sclerae unicteric, pupils round and equal Oropharynx clear and moist-- no thrush or other lesions No cervical or supraclavicular adenopathy Lungs no rales or rhonchi Heart regular rate and rhythm Abd soft, nontender, positive bowel sounds MSK no focal spinal tenderness, no upper extremity lymphedema Neuro: nonfocal, well oriented, appropriate affect Breasts: Deferred    LAB RESULTS:  CMP     Component Value Date/Time   NA 139 04/09/2016 1122   K 3.2 (L) 04/09/2016 1122   CL 103 02/29/2016 0820   CO2 27 04/09/2016 1122   GLUCOSE 78 04/09/2016 1122   BUN 15.3 04/09/2016 1122   CREATININE 0.9 04/09/2016 1122   CALCIUM 9.4 04/09/2016 1122   PROT 6.9 04/09/2016 1122   ALBUMIN 3.6 04/09/2016 1122   AST 17 04/09/2016 1122   ALT 12 04/09/2016 1122   ALKPHOS 89 04/09/2016 1122   BILITOT 0.47 04/09/2016 1122   GFRNONAA >60 02/29/2016 0820   GFRAA >60 02/29/2016 0820    INo results found for: SPEP, UPEP  Lab Results  Component Value Date   WBC 3.9 04/26/2016   NEUTROABS 2.5 04/26/2016    HGB 11.5 (L) 04/26/2016   HCT 35.2 04/26/2016   MCV 97.6 04/26/2016   PLT 99 (L) 04/26/2016      Chemistry      Component Value Date/Time   NA 139 04/09/2016 1122   K 3.2 (L) 04/09/2016 1122   CL 103 02/29/2016 0820   CO2 27 04/09/2016 1122   BUN 15.3 04/09/2016 1122   CREATININE 0.9 04/09/2016 1122      Component Value Date/Time   CALCIUM 9.4 04/09/2016 1122   ALKPHOS 89 04/09/2016 1122   AST 17 04/09/2016 1122   ALT 12 04/09/2016 1122   BILITOT 0.47 04/09/2016 1122       No results found for: LABCA2  No components found for: LABCA125  No results for input(s): INR in the last 168 hours.  Urinalysis    Component Value Date/Time   COLORURINE YELLOW 04/21/2012 0946   APPEARANCEUR CLOUDY (A) 04/21/2012 0946   LABSPEC 1.019 04/21/2012 0946   PHURINE 7.0 04/21/2012 0946   GLUCOSEU NEGATIVE 04/21/2012 0946   HGBUR NEGATIVE 04/21/2012 0946   BILIRUBINUR NEGATIVE 04/21/2012 0946   KETONESUR NEGATIVE 04/21/2012 0946   PROTEINUR NEGATIVE 04/21/2012 0946   UROBILINOGEN 1.0 04/21/2012 0946   NITRITE NEGATIVE 04/21/2012 0946   LEUKOCYTESUR NEGATIVE 04/21/2012 0946    STUDIES:  AND MICROSCOPIC INFORMATION  No results found. .  ASSESSMENT: 80 y.o. [place] woman with acute myeloid leukemia diagnosed by bone marrow biopsy 05/25/2015, with the blast count between 17 and 30% depending on the method of determination.   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, with gain of 2 and 19  (b) consider allogenic transplant  (c) all transfusion products to be irradiated  (1) started sQ azacitidine 06/05/2015, receiving 7 doses every 28 day cycle  (a) repeat bone marrow biopsy 02/29/2016 showed a mildly hypercellular marrow with dyspoiesis, but no increase in blasts either histologically or by flow cytometry  (2) immune thrombocytopenic purpura  (a) s/p IVIG 07/06/2015 (1g/kg x 2d) with excellent response  (b) romiplostim first dose 07/07/2015,  repeated weekly as of 08/04/2015  (3) biopsy of an enlarged right axillary lymph node at [facility] 11/13/2015 was nondiagnostic.  PLAN:  Eli is grieving appropriately the loss of her husband of more  than 50 years. I think she will benefit from grief counseling and she is planning to obtain that for her church.  As far as her leukemia/myelodysplasia is concerned, she is going to resume treatment 04/29/2016. She continues to tolerate treatment well.  Her ITP is generally controlled on her romiplostim. It is worth the of note that as soon as her shots were interrupted her platelet count dropped. The plan is to continue on this medication indefinitely  She will see me again   [name], MD   04/27/2016 5:37 PM

## 2016-04-29 ENCOUNTER — Ambulatory Visit (HOSPITAL_BASED_OUTPATIENT_CLINIC_OR_DEPARTMENT_OTHER): Payer: Medicare Other

## 2016-04-29 ENCOUNTER — Inpatient Hospital Stay: Payer: Medicare Other

## 2016-04-29 VITALS — BP 162/67 | HR 67 | Temp 97.9°F | Resp 18

## 2016-04-29 DIAGNOSIS — D61818 Other pancytopenia: Secondary | ICD-10-CM

## 2016-04-29 DIAGNOSIS — Z5111 Encounter for antineoplastic chemotherapy: Secondary | ICD-10-CM

## 2016-04-29 DIAGNOSIS — C9200 Acute myeloblastic leukemia, not having achieved remission: Secondary | ICD-10-CM

## 2016-04-29 MED ORDER — ONDANSETRON HCL 8 MG PO TABS
8.0000 mg | ORAL_TABLET | Freq: Once | ORAL | Status: AC
  Administered 2016-04-29: 8 mg via ORAL

## 2016-04-29 MED ORDER — ONDANSETRON HCL 8 MG PO TABS
ORAL_TABLET | ORAL | Status: AC
  Filled 2016-04-29: qty 1

## 2016-04-29 MED ORDER — AZACITIDINE CHEMO SQ INJECTION
75.0000 mg/m2 | Freq: Once | INTRAMUSCULAR | Status: AC
  Administered 2016-04-29: 140 mg via SUBCUTANEOUS
  Filled 2016-04-29: qty 5.6

## 2016-04-29 NOTE — Patient Instructions (Signed)
[facility] Discharge Instructions for Patients Receiving Chemotherapy  Today you received the following chemotherapy agents Vidaza.  To help prevent nausea and vomiting after your treatment, we encourage you to take your nausea medication as instructed.   If you develop nausea and vomiting that is not controlled by your nausea medication, call the clinic.   BELOW ARE SYMPTOMS THAT SHOULD BE REPORTED IMMEDIATELY:  *FEVER GREATER THAN 100.5 F  *CHILLS WITH OR WITHOUT FEVER  NAUSEA AND VOMITING THAT IS NOT CONTROLLED WITH YOUR NAUSEA MEDICATION  *UNUSUAL SHORTNESS OF BREATH  *UNUSUAL BRUISING OR BLEEDING  TENDERNESS IN MOUTH AND THROAT WITH OR WITHOUT PRESENCE OF ULCERS  *URINARY PROBLEMS  *BOWEL PROBLEMS  UNUSUAL RASH Items with * indicate a potential emergency and should be followed up as soon as possible.  Feel free to call the clinic you have any questions or concerns. The clinic phone number is [phone].  Please show the [place] at check-in to the Emergency Department and triage nurse.

## 2016-04-30 ENCOUNTER — Inpatient Hospital Stay: Payer: Medicare Other

## 2016-04-30 ENCOUNTER — Ambulatory Visit (HOSPITAL_BASED_OUTPATIENT_CLINIC_OR_DEPARTMENT_OTHER): Payer: Medicare Other

## 2016-04-30 VITALS — BP 139/67 | HR 70 | Temp 97.3°F | Resp 18

## 2016-04-30 DIAGNOSIS — C9200 Acute myeloblastic leukemia, not having achieved remission: Secondary | ICD-10-CM

## 2016-04-30 DIAGNOSIS — D61818 Other pancytopenia: Secondary | ICD-10-CM

## 2016-04-30 DIAGNOSIS — Z5111 Encounter for antineoplastic chemotherapy: Secondary | ICD-10-CM | POA: Diagnosis not present

## 2016-04-30 MED ORDER — AZACITIDINE CHEMO SQ INJECTION
75.0000 mg/m2 | Freq: Once | INTRAMUSCULAR | Status: AC
  Administered 2016-04-30: 140 mg via SUBCUTANEOUS
  Filled 2016-04-30: qty 5.6

## 2016-04-30 MED ORDER — ONDANSETRON HCL 8 MG PO TABS
ORAL_TABLET | ORAL | Status: AC
  Filled 2016-04-30: qty 1

## 2016-04-30 MED ORDER — ONDANSETRON HCL 8 MG PO TABS
8.0000 mg | ORAL_TABLET | Freq: Once | ORAL | Status: AC
  Administered 2016-04-30: 8 mg via ORAL

## 2016-04-30 NOTE — Patient Instructions (Signed)
[facility] Discharge Instructions for Patients Receiving Chemotherapy  Today you received the following chemotherapy agents: Vidaza.  To help prevent nausea and vomiting after your treatment, we encourage you to take your nausea medication as instructed.   If you develop nausea and vomiting that is not controlled by your nausea medication, call the clinic.   BELOW ARE SYMPTOMS THAT SHOULD BE REPORTED IMMEDIATELY:  *FEVER GREATER THAN 100.5 F  *CHILLS WITH OR WITHOUT FEVER  NAUSEA AND VOMITING THAT IS NOT CONTROLLED WITH YOUR NAUSEA MEDICATION  *UNUSUAL SHORTNESS OF BREATH  *UNUSUAL BRUISING OR BLEEDING  TENDERNESS IN MOUTH AND THROAT WITH OR WITHOUT PRESENCE OF ULCERS  *URINARY PROBLEMS  *BOWEL PROBLEMS  UNUSUAL RASH Items with * indicate a potential emergency and should be followed up as soon as possible.  Feel free to call the clinic you have any questions or concerns. The clinic phone number is [phone].  Please show the [place] at check-in to the Emergency Department and triage nurse.

## 2016-05-01 ENCOUNTER — Inpatient Hospital Stay: Payer: Medicare Other

## 2016-05-01 ENCOUNTER — Ambulatory Visit (HOSPITAL_BASED_OUTPATIENT_CLINIC_OR_DEPARTMENT_OTHER): Payer: Medicare Other

## 2016-05-01 DIAGNOSIS — Z5111 Encounter for antineoplastic chemotherapy: Secondary | ICD-10-CM

## 2016-05-01 DIAGNOSIS — C9200 Acute myeloblastic leukemia, not having achieved remission: Secondary | ICD-10-CM

## 2016-05-01 DIAGNOSIS — D61818 Other pancytopenia: Secondary | ICD-10-CM

## 2016-05-01 MED ORDER — AZACITIDINE CHEMO SQ INJECTION
75.0000 mg/m2 | Freq: Once | INTRAMUSCULAR | Status: AC
  Administered 2016-05-01: 140 mg via SUBCUTANEOUS
  Filled 2016-05-01: qty 5.6

## 2016-05-01 MED ORDER — ONDANSETRON HCL 8 MG PO TABS
8.0000 mg | ORAL_TABLET | Freq: Once | ORAL | Status: AC
  Administered 2016-05-01: 8 mg via ORAL

## 2016-05-01 MED ORDER — ONDANSETRON HCL 8 MG PO TABS
ORAL_TABLET | ORAL | Status: AC
  Filled 2016-05-01: qty 1

## 2016-05-01 NOTE — Patient Instructions (Signed)
[facility] Discharge Instructions for Patients Receiving Chemotherapy  Today you received the following chemotherapy agents Vidaza  To help prevent nausea and vomiting after your treatment, we encourage you to take your nausea medication   If you develop nausea and vomiting that is not controlled by your nausea medication, call the clinic.   BELOW ARE SYMPTOMS THAT SHOULD BE REPORTED IMMEDIATELY:  *FEVER GREATER THAN 100.5 F  *CHILLS WITH OR WITHOUT FEVER  NAUSEA AND VOMITING THAT IS NOT CONTROLLED WITH YOUR NAUSEA MEDICATION  *UNUSUAL SHORTNESS OF BREATH  *UNUSUAL BRUISING OR BLEEDING  TENDERNESS IN MOUTH AND THROAT WITH OR WITHOUT PRESENCE OF ULCERS  *URINARY PROBLEMS  *BOWEL PROBLEMS  UNUSUAL RASH Items with * indicate a potential emergency and should be followed up as soon as possible.  Feel free to call the clinic you have any questions or concerns. The clinic phone number is [phone].  Please show the CHEMO ALERT CARD at check-in to the Emergency Department and triage nurse.   

## 2016-05-02 ENCOUNTER — Inpatient Hospital Stay: Payer: Medicare Other

## 2016-05-02 ENCOUNTER — Ambulatory Visit (HOSPITAL_BASED_OUTPATIENT_CLINIC_OR_DEPARTMENT_OTHER): Payer: Medicare Other

## 2016-05-02 VITALS — BP 157/63 | HR 73 | Temp 98.4°F | Resp 18

## 2016-05-02 DIAGNOSIS — Z5111 Encounter for antineoplastic chemotherapy: Secondary | ICD-10-CM

## 2016-05-02 DIAGNOSIS — C9200 Acute myeloblastic leukemia, not having achieved remission: Secondary | ICD-10-CM

## 2016-05-02 DIAGNOSIS — D61818 Other pancytopenia: Secondary | ICD-10-CM

## 2016-05-02 MED ORDER — ONDANSETRON HCL 8 MG PO TABS
8.0000 mg | ORAL_TABLET | Freq: Once | ORAL | Status: AC
  Administered 2016-05-02: 8 mg via ORAL

## 2016-05-02 MED ORDER — ONDANSETRON HCL 8 MG PO TABS
ORAL_TABLET | ORAL | Status: AC
  Filled 2016-05-02: qty 1

## 2016-05-02 MED ORDER — AZACITIDINE CHEMO SQ INJECTION
75.0000 mg/m2 | Freq: Once | INTRAMUSCULAR | Status: AC
  Administered 2016-05-02: 140 mg via SUBCUTANEOUS
  Filled 2016-05-02: qty 5.6

## 2016-05-03 ENCOUNTER — Inpatient Hospital Stay: Payer: Medicare Other

## 2016-05-03 ENCOUNTER — Ambulatory Visit (HOSPITAL_BASED_OUTPATIENT_CLINIC_OR_DEPARTMENT_OTHER): Payer: Medicare Other

## 2016-05-03 VITALS — BP 152/73 | HR 77 | Temp 98.1°F | Resp 18

## 2016-05-03 DIAGNOSIS — D61818 Other pancytopenia: Secondary | ICD-10-CM

## 2016-05-03 DIAGNOSIS — Z5111 Encounter for antineoplastic chemotherapy: Secondary | ICD-10-CM | POA: Diagnosis not present

## 2016-05-03 DIAGNOSIS — C9200 Acute myeloblastic leukemia, not having achieved remission: Secondary | ICD-10-CM

## 2016-05-03 DIAGNOSIS — D693 Immune thrombocytopenic purpura: Secondary | ICD-10-CM | POA: Diagnosis not present

## 2016-05-03 DIAGNOSIS — D696 Thrombocytopenia, unspecified: Secondary | ICD-10-CM

## 2016-05-03 LAB — COMPREHENSIVE METABOLIC PANEL
ALK PHOS: 88 U/L (ref 40–150)
ALT: 11 U/L (ref 0–55)
ANION GAP: 8 meq/L (ref 3–11)
AST: 16 U/L (ref 5–34)
Albumin: 3.3 g/dL — ABNORMAL LOW (ref 3.5–5.0)
BILIRUBIN TOTAL: 0.58 mg/dL (ref 0.20–1.20)
BUN: 10.6 mg/dL (ref 7.0–26.0)
CALCIUM: 9.6 mg/dL (ref 8.4–10.4)
CO2: 29 mEq/L (ref 22–29)
CREATININE: 0.9 mg/dL (ref 0.6–1.1)
Chloride: 98 mEq/L (ref 98–109)
EGFR: 60 mL/min/{1.73_m2} — AB (ref >90)
Glucose: 113 mg/dl (ref 70–140)
Potassium: 3.5 mEq/L (ref 3.5–5.1)
Sodium: 135 mEq/L — ABNORMAL LOW (ref 136–145)
TOTAL PROTEIN: 6.4 g/dL (ref 6.4–8.3)

## 2016-05-03 LAB — CBC WITH DIFFERENTIAL/PLATELET
BASO%: 0.3 % (ref 0.0–2.0)
Basophils Absolute: 0 10*3/uL (ref 0.0–0.1)
EOS ABS: 0.1 10*3/uL (ref 0.0–0.5)
EOS%: 2.5 % (ref 0.0–7.0)
HEMATOCRIT: 32.8 % — AB (ref 34.8–46.6)
HGB: 11 g/dL — ABNORMAL LOW (ref 11.6–15.9)
LYMPH#: 1.1 10*3/uL (ref 0.9–3.3)
LYMPH%: 34.4 % (ref 14.0–49.7)
MCH: 32.2 pg (ref 25.1–34.0)
MCHC: 33.5 g/dL (ref 31.5–36.0)
MCV: 95.9 fL (ref 79.5–101.0)
MONO#: 0.1 10*3/uL (ref 0.1–0.9)
MONO%: 4.1 % (ref 0.0–14.0)
NEUT%: 58.7 % (ref 38.4–76.8)
NEUTROS ABS: 1.9 10*3/uL (ref 1.5–6.5)
PLATELETS: 56 10*3/uL — AB (ref 145–400)
RBC: 3.42 10*6/uL — ABNORMAL LOW (ref 3.70–5.45)
RDW: 18.1 % — ABNORMAL HIGH (ref 11.2–14.5)
WBC: 3.2 10*3/uL — ABNORMAL LOW (ref 3.9–10.3)

## 2016-05-03 LAB — CHCC SMEAR

## 2016-05-03 MED ORDER — ONDANSETRON HCL 8 MG PO TABS
8.0000 mg | ORAL_TABLET | Freq: Once | ORAL | Status: AC
  Administered 2016-05-03: 8 mg via ORAL

## 2016-05-03 MED ORDER — ONDANSETRON HCL 8 MG PO TABS
ORAL_TABLET | ORAL | Status: AC
  Filled 2016-05-03: qty 1

## 2016-05-03 MED ORDER — AZACITIDINE CHEMO SQ INJECTION
75.0000 mg/m2 | Freq: Once | INTRAMUSCULAR | Status: AC
  Administered 2016-05-03: 140 mg via SUBCUTANEOUS
  Filled 2016-05-03: qty 5.6

## 2016-05-03 MED ORDER — ROMIPLOSTIM 250 MCG ~~LOC~~ SOLR
60.0000 ug | Freq: Once | SUBCUTANEOUS | Status: AC
  Administered 2016-05-03: 60 ug via SUBCUTANEOUS
  Filled 2016-05-03: qty 0.12

## 2016-05-03 NOTE — Patient Instructions (Signed)
[facility] Discharge Instructions for Patients Receiving Chemotherapy  Today you received the following chemotherapy agents Velcade. To help prevent nausea and vomiting after your treatment, we encourage you to take your nausea medication as directed.  If you develop nausea and vomiting that is not controlled by your nausea medication, call the clinic.   BELOW ARE SYMPTOMS THAT SHOULD BE REPORTED IMMEDIATELY:  *FEVER GREATER THAN 100.5 F  *CHILLS WITH OR WITHOUT FEVER  NAUSEA AND VOMITING THAT IS NOT CONTROLLED WITH YOUR NAUSEA MEDICATION  *UNUSUAL SHORTNESS OF BREATH  *UNUSUAL BRUISING OR BLEEDING  TENDERNESS IN MOUTH AND THROAT WITH OR WITHOUT PRESENCE OF ULCERS  *URINARY PROBLEMS  *BOWEL PROBLEMS  UNUSUAL RASH Items with * indicate a potential emergency and should be followed up as soon as possible.  Feel free to call the clinic you have any questions or concerns. The clinic phone number is [phone].  Please show the CHEMO ALERT CARD at check-in to the Emergency Department and triage nurse.    

## 2016-05-06 ENCOUNTER — Inpatient Hospital Stay: Payer: Medicare Other

## 2016-05-06 ENCOUNTER — Ambulatory Visit (HOSPITAL_BASED_OUTPATIENT_CLINIC_OR_DEPARTMENT_OTHER): Payer: Medicare Other

## 2016-05-06 VITALS — BP 137/70 | HR 78 | Temp 98.0°F | Resp 16

## 2016-05-06 DIAGNOSIS — C9200 Acute myeloblastic leukemia, not having achieved remission: Secondary | ICD-10-CM | POA: Diagnosis not present

## 2016-05-06 DIAGNOSIS — D61818 Other pancytopenia: Secondary | ICD-10-CM

## 2016-05-06 DIAGNOSIS — Z5111 Encounter for antineoplastic chemotherapy: Secondary | ICD-10-CM

## 2016-05-06 MED ORDER — ONDANSETRON HCL 8 MG PO TABS
8.0000 mg | ORAL_TABLET | Freq: Once | ORAL | Status: AC
  Administered 2016-05-06: 8 mg via ORAL

## 2016-05-06 MED ORDER — AZACITIDINE CHEMO SQ INJECTION
75.0000 mg/m2 | Freq: Once | INTRAMUSCULAR | Status: AC
  Administered 2016-05-06: 140 mg via SUBCUTANEOUS
  Filled 2016-05-06: qty 5.6

## 2016-05-06 MED ORDER — ONDANSETRON HCL 8 MG PO TABS
ORAL_TABLET | ORAL | Status: AC
  Filled 2016-05-06: qty 1

## 2016-05-06 NOTE — Patient Instructions (Signed)
[facility] Discharge Instructions for Patients Receiving Chemotherapy  Today you received the following chemotherapy agents Vidaza  To help prevent nausea and vomiting after your treatment, we encourage you to take your nausea medication as prescribed.    If you develop nausea and vomiting that is not controlled by your nausea medication, call the clinic.   BELOW ARE SYMPTOMS THAT SHOULD BE REPORTED IMMEDIATELY:  *FEVER GREATER THAN 100.5 F  *CHILLS WITH OR WITHOUT FEVER  NAUSEA AND VOMITING THAT IS NOT CONTROLLED WITH YOUR NAUSEA MEDICATION  *UNUSUAL SHORTNESS OF BREATH  *UNUSUAL BRUISING OR BLEEDING  TENDERNESS IN MOUTH AND THROAT WITH OR WITHOUT PRESENCE OF ULCERS  *URINARY PROBLEMS  *BOWEL PROBLEMS  UNUSUAL RASH Items with * indicate a potential emergency and should be followed up as soon as possible.  Feel free to call the clinic you have any questions or concerns. The clinic phone number is [phone].  Please show the CHEMO ALERT CARD at check-in to the Emergency Department and triage nurse.   

## 2016-05-07 ENCOUNTER — Other Ambulatory Visit: Payer: Self-pay | Admitting: Oncology

## 2016-05-07 ENCOUNTER — Ambulatory Visit (HOSPITAL_BASED_OUTPATIENT_CLINIC_OR_DEPARTMENT_OTHER): Payer: Medicare Other

## 2016-05-07 ENCOUNTER — Inpatient Hospital Stay: Payer: Medicare Other

## 2016-05-07 ENCOUNTER — Telehealth: Payer: Self-pay | Admitting: Oncology

## 2016-05-07 VITALS — BP 145/68 | HR 82 | Temp 98.0°F | Resp 18

## 2016-05-07 DIAGNOSIS — Z5111 Encounter for antineoplastic chemotherapy: Secondary | ICD-10-CM

## 2016-05-07 DIAGNOSIS — C9200 Acute myeloblastic leukemia, not having achieved remission: Secondary | ICD-10-CM | POA: Diagnosis not present

## 2016-05-07 DIAGNOSIS — D61818 Other pancytopenia: Secondary | ICD-10-CM

## 2016-05-07 MED ORDER — AZACITIDINE CHEMO SQ INJECTION
75.0000 mg/m2 | Freq: Once | INTRAMUSCULAR | Status: AC
  Administered 2016-05-07: 140 mg via SUBCUTANEOUS
  Filled 2016-05-07: qty 5.6

## 2016-05-07 MED ORDER — ONDANSETRON HCL 8 MG PO TABS
8.0000 mg | ORAL_TABLET | Freq: Once | ORAL | Status: AC
  Administered 2016-05-07: 8 mg via ORAL

## 2016-05-07 MED ORDER — ONDANSETRON HCL 8 MG PO TABS
ORAL_TABLET | ORAL | Status: AC
  Filled 2016-05-07: qty 1

## 2016-05-07 NOTE — Telephone Encounter (Signed)
lvm to inform pt of added lab/inj appts per LOS. Instructed pt to stop by scheduling to get updated copy of appts date/times at 11/14 visit

## 2016-05-07 NOTE — Patient Instructions (Signed)
[facility] Discharge Instructions for Patients Receiving Chemotherapy  Today you received the following chemotherapy agents Vidaza  To help prevent nausea and vomiting after your treatment, we encourage you to take your nausea medication as prescribed.    If you develop nausea and vomiting that is not controlled by your nausea medication, call the clinic.   BELOW ARE SYMPTOMS THAT SHOULD BE REPORTED IMMEDIATELY:  *FEVER GREATER THAN 100.5 F  *CHILLS WITH OR WITHOUT FEVER  NAUSEA AND VOMITING THAT IS NOT CONTROLLED WITH YOUR NAUSEA MEDICATION  *UNUSUAL SHORTNESS OF BREATH  *UNUSUAL BRUISING OR BLEEDING  TENDERNESS IN MOUTH AND THROAT WITH OR WITHOUT PRESENCE OF ULCERS  *URINARY PROBLEMS  *BOWEL PROBLEMS  UNUSUAL RASH Items with * indicate a potential emergency and should be followed up as soon as possible.  Feel free to call the clinic you have any questions or concerns. The clinic phone number is [phone].  Please show the CHEMO ALERT CARD at check-in to the Emergency Department and triage nurse.   

## 2016-05-08 ENCOUNTER — Inpatient Hospital Stay: Payer: Medicare Other

## 2016-05-09 ENCOUNTER — Inpatient Hospital Stay: Payer: Medicare Other

## 2016-05-10 ENCOUNTER — Ambulatory Visit (HOSPITAL_BASED_OUTPATIENT_CLINIC_OR_DEPARTMENT_OTHER): Payer: Medicare Other

## 2016-05-10 ENCOUNTER — Other Ambulatory Visit (HOSPITAL_BASED_OUTPATIENT_CLINIC_OR_DEPARTMENT_OTHER): Payer: Medicare Other

## 2016-05-10 ENCOUNTER — Inpatient Hospital Stay: Payer: Medicare Other

## 2016-05-10 ENCOUNTER — Other Ambulatory Visit: Payer: Medicare Other

## 2016-05-10 VITALS — BP 138/69 | HR 87 | Temp 98.0°F | Resp 20

## 2016-05-10 DIAGNOSIS — D61818 Other pancytopenia: Secondary | ICD-10-CM

## 2016-05-10 DIAGNOSIS — D693 Immune thrombocytopenic purpura: Secondary | ICD-10-CM | POA: Diagnosis not present

## 2016-05-10 DIAGNOSIS — C9200 Acute myeloblastic leukemia, not having achieved remission: Secondary | ICD-10-CM

## 2016-05-10 DIAGNOSIS — D696 Thrombocytopenia, unspecified: Secondary | ICD-10-CM

## 2016-05-10 LAB — CBC WITH DIFFERENTIAL/PLATELET
BASO%: 0.5 % (ref 0.0–2.0)
BASOS ABS: 0 10*3/uL (ref 0.0–0.1)
EOS%: 2.3 % (ref 0.0–7.0)
Eosinophils Absolute: 0.1 10*3/uL (ref 0.0–0.5)
HEMATOCRIT: 32.8 % — AB (ref 34.8–46.6)
HGB: 11.1 g/dL — ABNORMAL LOW (ref 11.6–15.9)
LYMPH#: 1.6 10*3/uL (ref 0.9–3.3)
LYMPH%: 40.9 % (ref 14.0–49.7)
MCH: 32.8 pg (ref 25.1–34.0)
MCHC: 33.8 g/dL (ref 31.5–36.0)
MCV: 97 fL (ref 79.5–101.0)
MONO#: 0.1 10*3/uL (ref 0.1–0.9)
MONO%: 3.1 % (ref 0.0–14.0)
NEUT#: 2.1 10*3/uL (ref 1.5–6.5)
NEUT%: 53.2 % (ref 38.4–76.8)
PLATELETS: 63 10*3/uL — AB (ref 145–400)
RBC: 3.38 10*6/uL — AB (ref 3.70–5.45)
RDW: 17.9 % — ABNORMAL HIGH (ref 11.2–14.5)
WBC: 3.9 10*3/uL (ref 3.9–10.3)

## 2016-05-10 LAB — CHCC SMEAR

## 2016-05-10 MED ORDER — ROMIPLOSTIM 250 MCG ~~LOC~~ SOLR
60.0000 ug | Freq: Once | SUBCUTANEOUS | Status: AC
  Administered 2016-05-10: 60 ug via SUBCUTANEOUS
  Filled 2016-05-10: qty 0.12

## 2016-05-10 NOTE — Patient Instructions (Signed)
Romiplostim injection What is this medicine? ROMIPLOSTIM (roe mi PLOE stim) helps your body make more platelets. This medicine is used to treat low platelets caused by chronic idiopathic thrombocytopenic purpura (ITP). This medicine may be used for other purposes; ask your health care provider or pharmacist if you have questions. What should I tell my health care provider before I take this medicine? They need to know if you have any of these conditions: -cancer or myelodysplastic syndrome -low blood counts, like low white cell, platelet, or red cell counts -take medicines that treat or prevent blood clots -an unusual or allergic reaction to romiplostim, mannitol, other medicines, foods, dyes, or preservatives -pregnant or trying to get pregnant -breast-feeding How should I use this medicine? This medicine is for injection under the skin. It is given by a health care professional in a hospital or clinic setting. A special MedGuide will be given to you before your injection. Read this information carefully each time. Talk to your pediatrician regarding the use of this medicine in children. Special care may be needed. Overdosage: If you think you have taken too much of this medicine contact a poison control center or emergency room at once. NOTE: This medicine is only for you. Do not share this medicine with others. What if I miss a dose? It is important not to miss your dose. Call your doctor or health care professional if you are unable to keep an appointment. What may interact with this medicine? Interactions are not expected. This list may not describe all possible interactions. Give your health care provider a list of all the medicines, herbs, non-prescription drugs, or dietary supplements you use. Also tell them if you smoke, drink alcohol, or use illegal drugs. Some items may interact with your medicine. What should I watch for while using this medicine? Your condition will be monitored  carefully while you are receiving this medicine. Visit your prescriber or health care professional for regular checks on your progress and for the needed blood tests. It is important to keep all appointments. What side effects may I notice from receiving this medicine? Side effects that you should report to your doctor or health care professional as soon as possible: -allergic reactions like skin rash, itching or hives, swelling of the face, lips, or tongue -shortness of breath, chest pain, swelling in a leg -unusual bleeding or bruising Side effects that usually do not require medical attention (report to your doctor or health care professional if they continue or are bothersome): -dizziness -headache -muscle aches -pain in arms and legs -stomach pain -trouble sleeping This list may not describe all possible side effects. Call your doctor for medical advice about side effects. You may report side effects to FDA at 1-800-FDA-1088. Where should I keep my medicine? This drug is given in a hospital or clinic and will not be stored at home. NOTE: This sheet is a summary. It may not cover all possible information. If you have questions about this medicine, talk to your doctor, pharmacist, or health care provider.    2016, Elsevier/Gold Standard. (2008-02-08 15:13:04)  

## 2016-05-13 ENCOUNTER — Other Ambulatory Visit: Payer: Self-pay | Admitting: Family

## 2016-05-13 ENCOUNTER — Ambulatory Visit: Payer: Medicare Other | Admitting: Oncology

## 2016-05-13 ENCOUNTER — Other Ambulatory Visit: Payer: Medicare Other

## 2016-05-13 ENCOUNTER — Inpatient Hospital Stay: Payer: Medicare Other

## 2016-05-14 ENCOUNTER — Inpatient Hospital Stay: Payer: Medicare Other

## 2016-05-15 ENCOUNTER — Inpatient Hospital Stay: Payer: Medicare Other

## 2016-05-17 ENCOUNTER — Other Ambulatory Visit: Payer: Self-pay | Admitting: *Deleted

## 2016-05-17 ENCOUNTER — Ambulatory Visit (HOSPITAL_BASED_OUTPATIENT_CLINIC_OR_DEPARTMENT_OTHER): Payer: Medicare Other

## 2016-05-17 ENCOUNTER — Other Ambulatory Visit (HOSPITAL_BASED_OUTPATIENT_CLINIC_OR_DEPARTMENT_OTHER): Payer: Medicare Other

## 2016-05-17 ENCOUNTER — Inpatient Hospital Stay: Payer: Medicare Other

## 2016-05-17 VITALS — BP 140/67 | HR 80 | Temp 97.8°F | Resp 20

## 2016-05-17 DIAGNOSIS — C9200 Acute myeloblastic leukemia, not having achieved remission: Secondary | ICD-10-CM

## 2016-05-17 DIAGNOSIS — D61818 Other pancytopenia: Secondary | ICD-10-CM

## 2016-05-17 DIAGNOSIS — D693 Immune thrombocytopenic purpura: Secondary | ICD-10-CM

## 2016-05-17 DIAGNOSIS — C9201 Acute myeloblastic leukemia, in remission: Secondary | ICD-10-CM

## 2016-05-17 DIAGNOSIS — D696 Thrombocytopenia, unspecified: Secondary | ICD-10-CM

## 2016-05-17 LAB — CBC WITH DIFFERENTIAL/PLATELET
BASO%: 1.2 % (ref 0.0–2.0)
BASOS ABS: 0 10*3/uL (ref 0.0–0.1)
EOS ABS: 0.2 10*3/uL (ref 0.0–0.5)
EOS%: 7.1 % — AB (ref 0.0–7.0)
HCT: 30.7 % — ABNORMAL LOW (ref 34.8–46.6)
HGB: 10.1 g/dL — ABNORMAL LOW (ref 11.6–15.9)
LYMPH%: 49 % (ref 14.0–49.7)
MCH: 32.6 pg (ref 25.1–34.0)
MCHC: 33 g/dL (ref 31.5–36.0)
MCV: 98.8 fL (ref 79.5–101.0)
MONO#: 0.1 10*3/uL (ref 0.1–0.9)
MONO%: 4.3 % (ref 0.0–14.0)
NEUT#: 0.8 10*3/uL — ABNORMAL LOW (ref 1.5–6.5)
NEUT%: 38.4 % (ref 38.4–76.8)
Platelets: 49 10*3/uL — ABNORMAL LOW (ref 145–400)
RBC: 3.11 10*6/uL — AB (ref 3.70–5.45)
RDW: 20.7 % — AB (ref 11.2–14.5)
WBC: 2.2 10*3/uL — ABNORMAL LOW (ref 3.9–10.3)
lymph#: 1.1 10*3/uL (ref 0.9–3.3)

## 2016-05-17 LAB — CHCC SMEAR

## 2016-05-17 MED ORDER — ROMIPLOSTIM 250 MCG ~~LOC~~ SOLR
120.0000 ug | Freq: Once | SUBCUTANEOUS | Status: AC
  Administered 2016-05-17: 120 ug via SUBCUTANEOUS
  Filled 2016-05-17: qty 0.24

## 2016-05-17 NOTE — Patient Instructions (Signed)
Romiplostim injection What is this medicine? ROMIPLOSTIM (roe mi PLOE stim) helps your body make more platelets. This medicine is used to treat low platelets caused by chronic idiopathic thrombocytopenic purpura (ITP). This medicine may be used for other purposes; ask your health care provider or pharmacist if you have questions. What should I tell my health care provider before I take this medicine? They need to know if you have any of these conditions: -cancer or myelodysplastic syndrome -low blood counts, like low white cell, platelet, or red cell counts -take medicines that treat or prevent blood clots -an unusual or allergic reaction to romiplostim, mannitol, other medicines, foods, dyes, or preservatives -pregnant or trying to get pregnant -breast-feeding How should I use this medicine? This medicine is for injection under the skin. It is given by a health care professional in a hospital or clinic setting. A special MedGuide will be given to you before your injection. Read this information carefully each time. Talk to your pediatrician regarding the use of this medicine in children. Special care may be needed. Overdosage: If you think you have taken too much of this medicine contact a poison control center or emergency room at once. NOTE: This medicine is only for you. Do not share this medicine with others. What if I miss a dose? It is important not to miss your dose. Call your doctor or health care professional if you are unable to keep an appointment. What may interact with this medicine? Interactions are not expected. This list may not describe all possible interactions. Give your health care provider a list of all the medicines, herbs, non-prescription drugs, or dietary supplements you use. Also tell them if you smoke, drink alcohol, or use illegal drugs. Some items may interact with your medicine. What should I watch for while using this medicine? Your condition will be monitored  carefully while you are receiving this medicine. Visit your prescriber or health care professional for regular checks on your progress and for the needed blood tests. It is important to keep all appointments. What side effects may I notice from receiving this medicine? Side effects that you should report to your doctor or health care professional as soon as possible: -allergic reactions like skin rash, itching or hives, swelling of the face, lips, or tongue -shortness of breath, chest pain, swelling in a leg -unusual bleeding or bruising Side effects that usually do not require medical attention (report to your doctor or health care professional if they continue or are bothersome): -dizziness -headache -muscle aches -pain in arms and legs -stomach pain -trouble sleeping This list may not describe all possible side effects. Call your doctor for medical advice about side effects. You may report side effects to FDA at 1-800-FDA-1088. Where should I keep my medicine? This drug is given in a hospital or clinic and will not be stored at home. NOTE: This sheet is a summary. It may not cover all possible information. If you have questions about this medicine, talk to your doctor, pharmacist, or health care provider.    2016, Elsevier/Gold Standard. (2008-02-08 15:13:04)  

## 2016-05-19 ENCOUNTER — Other Ambulatory Visit: Payer: Self-pay | Admitting: Oncology

## 2016-05-20 ENCOUNTER — Ambulatory Visit: Payer: Medicare Other | Admitting: Oncology

## 2016-05-20 ENCOUNTER — Other Ambulatory Visit: Payer: Medicare Other

## 2016-05-20 ENCOUNTER — Inpatient Hospital Stay: Payer: Medicare Other

## 2016-05-21 ENCOUNTER — Inpatient Hospital Stay: Payer: Medicare Other

## 2016-05-24 ENCOUNTER — Ambulatory Visit (HOSPITAL_BASED_OUTPATIENT_CLINIC_OR_DEPARTMENT_OTHER): Payer: Medicare Other

## 2016-05-24 ENCOUNTER — Other Ambulatory Visit: Payer: Self-pay | Admitting: *Deleted

## 2016-05-24 ENCOUNTER — Other Ambulatory Visit: Payer: Medicare Other

## 2016-05-24 ENCOUNTER — Other Ambulatory Visit (HOSPITAL_BASED_OUTPATIENT_CLINIC_OR_DEPARTMENT_OTHER): Payer: Medicare Other

## 2016-05-24 VITALS — BP 160/72 | HR 78 | Temp 98.0°F | Resp 18

## 2016-05-24 DIAGNOSIS — D61818 Other pancytopenia: Secondary | ICD-10-CM

## 2016-05-24 DIAGNOSIS — C9200 Acute myeloblastic leukemia, not having achieved remission: Secondary | ICD-10-CM

## 2016-05-24 DIAGNOSIS — D693 Immune thrombocytopenic purpura: Secondary | ICD-10-CM

## 2016-05-24 DIAGNOSIS — D696 Thrombocytopenia, unspecified: Secondary | ICD-10-CM

## 2016-05-24 DIAGNOSIS — C9201 Acute myeloblastic leukemia, in remission: Secondary | ICD-10-CM

## 2016-05-24 LAB — CBC WITH DIFFERENTIAL/PLATELET
BASO%: 2 % (ref 0.0–2.0)
Basophils Absolute: 0 10*3/uL (ref 0.0–0.1)
EOS ABS: 0.1 10*3/uL (ref 0.0–0.5)
EOS%: 3.4 % (ref 0.0–7.0)
HCT: 30.1 % — ABNORMAL LOW (ref 34.8–46.6)
HEMOGLOBIN: 10 g/dL — AB (ref 11.6–15.9)
LYMPH#: 0.9 10*3/uL (ref 0.9–3.3)
LYMPH%: 58 % — ABNORMAL HIGH (ref 14.0–49.7)
MCH: 33.3 pg (ref 25.1–34.0)
MCHC: 33.2 g/dL (ref 31.5–36.0)
MCV: 100.2 fL (ref 79.5–101.0)
MONO#: 0 10*3/uL — ABNORMAL LOW (ref 0.1–0.9)
MONO%: 1.7 % (ref 0.0–14.0)
NEUT%: 34.9 % — ABNORMAL LOW (ref 38.4–76.8)
NEUTROS ABS: 0.6 10*3/uL — AB (ref 1.5–6.5)
Platelets: 37 10*3/uL — ABNORMAL LOW (ref 145–400)
RBC: 3.01 10*6/uL — ABNORMAL LOW (ref 3.70–5.45)
RDW: 21.5 % — AB (ref 11.2–14.5)
WBC: 1.6 10*3/uL — AB (ref 3.9–10.3)

## 2016-05-24 LAB — CHCC SMEAR

## 2016-05-24 MED ORDER — ROMIPLOSTIM 250 MCG ~~LOC~~ SOLR
190.0000 ug | Freq: Once | SUBCUTANEOUS | Status: AC
  Administered 2016-05-24: 190 ug via SUBCUTANEOUS
  Filled 2016-05-24: qty 0.38

## 2016-05-24 NOTE — Patient Instructions (Signed)
Romiplostim injection What is this medicine? ROMIPLOSTIM (roe mi PLOE stim) helps your body make more platelets. This medicine is used to treat low platelets caused by chronic idiopathic thrombocytopenic purpura (ITP). This medicine may be used for other purposes; ask your health care provider or pharmacist if you have questions. What should I tell my health care provider before I take this medicine? They need to know if you have any of these conditions: -cancer or myelodysplastic syndrome -low blood counts, like low white cell, platelet, or red cell counts -take medicines that treat or prevent blood clots -an unusual or allergic reaction to romiplostim, mannitol, other medicines, foods, dyes, or preservatives -pregnant or trying to get pregnant -breast-feeding How should I use this medicine? This medicine is for injection under the skin. It is given by a health care professional in a hospital or clinic setting. A special MedGuide will be given to you before your injection. Read this information carefully each time. Talk to your pediatrician regarding the use of this medicine in children. Special care may be needed. Overdosage: If you think you have taken too much of this medicine contact a poison control center or emergency room at once. NOTE: This medicine is only for you. Do not share this medicine with others. What if I miss a dose? It is important not to miss your dose. Call your doctor or health care professional if you are unable to keep an appointment. What may interact with this medicine? Interactions are not expected. This list may not describe all possible interactions. Give your health care provider a list of all the medicines, herbs, non-prescription drugs, or dietary supplements you use. Also tell them if you smoke, drink alcohol, or use illegal drugs. Some items may interact with your medicine. What should I watch for while using this medicine? Your condition will be monitored  carefully while you are receiving this medicine. Visit your prescriber or health care professional for regular checks on your progress and for the needed blood tests. It is important to keep all appointments. What side effects may I notice from receiving this medicine? Side effects that you should report to your doctor or health care professional as soon as possible: -allergic reactions like skin rash, itching or hives, swelling of the face, lips, or tongue -shortness of breath, chest pain, swelling in a leg -unusual bleeding or bruising Side effects that usually do not require medical attention (report to your doctor or health care professional if they continue or are bothersome): -dizziness -headache -muscle aches -pain in arms and legs -stomach pain -trouble sleeping This list may not describe all possible side effects. Call your doctor for medical advice about side effects. You may report side effects to FDA at 1-800-FDA-1088. Where should I keep my medicine? This drug is given in a hospital or clinic and will not be stored at home. NOTE: This sheet is a summary. It may not cover all possible information. If you have questions about this medicine, talk to your doctor, pharmacist, or health care provider.    2016, Elsevier/Gold Standard. (2008-02-08 15:13:04)  

## 2016-05-27 ENCOUNTER — Inpatient Hospital Stay: Payer: Medicare Other

## 2016-05-27 ENCOUNTER — Other Ambulatory Visit (HOSPITAL_BASED_OUTPATIENT_CLINIC_OR_DEPARTMENT_OTHER): Payer: Medicare Other

## 2016-05-27 ENCOUNTER — Other Ambulatory Visit: Payer: Self-pay | Admitting: Emergency Medicine

## 2016-05-27 ENCOUNTER — Encounter: Payer: Self-pay | Admitting: Emergency Medicine

## 2016-05-27 ENCOUNTER — Ambulatory Visit (HOSPITAL_BASED_OUTPATIENT_CLINIC_OR_DEPARTMENT_OTHER): Payer: Medicare Other | Admitting: Oncology

## 2016-05-27 VITALS — BP 138/75 | HR 78 | Temp 97.5°F | Resp 17 | Ht 68.0 in | Wt 139.3 lb

## 2016-05-27 DIAGNOSIS — D613 Idiopathic aplastic anemia: Secondary | ICD-10-CM

## 2016-05-27 DIAGNOSIS — C9200 Acute myeloblastic leukemia, not having achieved remission: Secondary | ICD-10-CM

## 2016-05-27 DIAGNOSIS — C9201 Acute myeloblastic leukemia, in remission: Secondary | ICD-10-CM

## 2016-05-27 DIAGNOSIS — D469 Myelodysplastic syndrome, unspecified: Secondary | ICD-10-CM

## 2016-05-27 LAB — COMPREHENSIVE METABOLIC PANEL
ALT: 13 U/L (ref 0–55)
AST: 17 U/L (ref 5–34)
Albumin: 3.4 g/dL — ABNORMAL LOW (ref 3.5–5.0)
Alkaline Phosphatase: 77 U/L (ref 40–150)
Anion Gap: 9 mEq/L (ref 3–11)
BUN: 9.3 mg/dL (ref 7.0–26.0)
CHLORIDE: 104 meq/L (ref 98–109)
CO2: 27 meq/L (ref 22–29)
CREATININE: 0.8 mg/dL (ref 0.6–1.1)
Calcium: 9.7 mg/dL (ref 8.4–10.4)
EGFR: 67 mL/min/{1.73_m2} — ABNORMAL LOW (ref >90)
GLUCOSE: 76 mg/dL (ref 70–140)
Potassium: 3.5 mEq/L (ref 3.5–5.1)
SODIUM: 140 meq/L (ref 136–145)
Total Bilirubin: 0.65 mg/dL (ref 0.20–1.20)
Total Protein: 6.4 g/dL (ref 6.4–8.3)

## 2016-05-27 LAB — CBC WITH DIFFERENTIAL/PLATELET
BASO%: 0 % (ref 0.0–2.0)
Basophils Absolute: 0 10*3/uL (ref 0.0–0.1)
EOS%: 4 % (ref 0.0–7.0)
Eosinophils Absolute: 0.1 10*3/uL (ref 0.0–0.5)
HEMATOCRIT: 30.5 % — AB (ref 34.8–46.6)
HGB: 10.3 g/dL — ABNORMAL LOW (ref 11.6–15.9)
LYMPH#: 1 10*3/uL (ref 0.9–3.3)
LYMPH%: 67.8 % — ABNORMAL HIGH (ref 14.0–49.7)
MCH: 33.4 pg (ref 25.1–34.0)
MCHC: 33.8 g/dL (ref 31.5–36.0)
MCV: 99 fL (ref 79.5–101.0)
MONO#: 0 10*3/uL — ABNORMAL LOW (ref 0.1–0.9)
MONO%: 2 % (ref 0.0–14.0)
NEUT#: 0.4 10*3/uL — CL (ref 1.5–6.5)
NEUT%: 26.2 % — AB (ref 38.4–76.8)
Platelets: 34 10*3/uL — ABNORMAL LOW (ref 145–400)
RBC: 3.08 10*6/uL — AB (ref 3.70–5.45)
RDW: 19.6 % — ABNORMAL HIGH (ref 11.2–14.5)
WBC: 1.5 10*3/uL — ABNORMAL LOW (ref 3.9–10.3)

## 2016-05-27 NOTE — Progress Notes (Signed)
Patient's ANC 0.4; Dr Jana Hakim aware; no vidaza this week; will continue N-plate scheduled for [id number].

## 2016-05-27 NOTE — Progress Notes (Signed)
[place]    NB: [name] did not show for her appointment and start of treatment in 04/08/2016 because of an emergency with her husband at home. We have moved the start of the current cycle of her treatment to 04/09/2016.    [phone] [phone]   ID: [name] DOB: July 12, 1935  MR#: [id number]  JYN#:[id number]  Patient Care Team: Golden Circle, FNP as PCP - General (Family Medicine) PCP: [name], FNP, [name] GYN: SU:  OTHER MD: [name], [name], [name]  CHIEF COMPLAINT: Acute myeloid leukemia; ITP  CURRENT TREATMENT: Azacitidine, romiplostim  HISTORY OF PRESENT ILLNESS: From the 05/22/2025 consult note:  "The patient was evaluated at her PCP's office 05/22/2015 with a complaint of DOE worsening over several months. Labwork was obtained including a CBC, whioch showed WBC 1.8, platelets 14K, and Hb 5.9 with an MCV of 97.5. DDimer was 1.04. Accordingly the patient was referred to the ED and was admitted 05/23/2015.   Labwork since admission includes a repeat CBC with WBC 1.7, HB 5.4, MCV 100.6 and platelets 10K. Creat was 1.04 with GFR 50. Reticulocytes are not elevated with abs 39.3 (1.7%) and LDH is normal at 188. Other labs are pending.  The patient tells me because of peripheral neuropathy she has been receiving monthly B-12 shots since 2005."  Bone marrow biopsy 05/25/2015 showed acute myeloid leukemia, with 17% blasts by flow cytometry, 24% by aspirate counts, and 20-30% by immunohistochemistry ([id number], and [id number]). The blasts were positive for CD 117 and focally for MPO. There were also myelodysplasia related changes in all 3 cell lines.  The patient's subsequent history is as detailed below.  INTERVAL HISTORY: [name] returns today for follow-up of her acute leukemia/myelodysplasia. She found the holidays without her husband "bittersweet", but she feels very low lady" hollow inside" after  all those years of marriage staying alone in the house. She does have a daughter in town and her son of course is not far in [place].  REVIEW OF SYSTEMS: She denies any bleeding or fevers. She has a good appetite and her weight is stable. Just a little bit of a dry cough at times. She has stress urinary incontinence. She has joint pains here and there which are not more intense or persistent than before. She feels a bit forgetful. A detailed review of systems today was otherwise stable  PAST MEDICAL HISTORY: Past Medical History:  Diagnosis Date  . Anemia   . Anxiety   . Arthritis    "hand joints; hips; back" (05/23/2015)  . Cancer ([place])    dec. 2016-leukemia  . Chronic lower back pain   . Depression   . GERD (gastroesophageal reflux disease)   . History of blood transfusion 05/23/2015   "Hgb 5.9"  . Hypertension   . Shortness of breath dyspnea    with exertion    PAST SURGICAL HISTORY: Past Surgical History:  Procedure Laterality Date  . Bone spur     R thigh  . CATARACT EXTRACTION W/ INTRAOCULAR LENS  IMPLANT, BILATERAL  ~ 2005  . DILATION AND CURETTAGE OF UTERUS    . ESOPHAGOGASTRODUODENOSCOPY (EGD) WITH PROPOFOL N/A 11/08/2014   Procedure: ESOPHAGOGASTRODUODENOSCOPY (EGD) WITH PROPOFOL;  Surgeon: Garlan Fair, MD;  Location[facility];  Service: Endoscopy;  Laterality: N/A;  . JOINT REPLACEMENT    . TONSILLECTOMY  ~ 1950  . TOTAL KNEE ARTHROPLASTY  04/27/2012   Procedure: TOTAL KNEE ARTHROPLASTY;  Surgeon: Pietro Cassis  Alvan Dame, MD;  Location: [facility];  Service: Orthopedics;  Laterality: Right;  . TOTAL SHOULDER ARTHROPLASTY  09/13/2011   Procedure: TOTAL SHOULDER ARTHROPLASTY;  Surgeon: Augustin Schooling, MD;  Location: [place];  Service: Orthopedics;  Laterality: Left;  LEFT SHOULDER REVERSED TOTAL SHOULDER ARTHROPLASTY    FAMILY HISTORY Family History  Problem Relation Age of Onset  . Heart attack Mother 7    Died age 45  . Heart disease Brother     Atrial fib  Patient's  father died age 42 with prostate cancer; mother died atv24 with an MI. One brother, alive at 44; one sister with parkinson's. No blood or cancer problems otherwise in family  GYNECOLOGIC HISTORY:  No LMP recorded. Patient is postmenopausal. Menarche age 60, first live birth age 81, she is GXP3; menopause age 6, no HR  SOCIAL HISTORY:  Grade school Pharmacist, hospital, retired; husband was a Higher education careers adviser. He has severe heart disease and she is his main caretaker. Son [name] works for [employer] and sings in the [employer]; daughter Piedra lives in [place], a homemaker; daughter [name] works at the surgical center in [employer] as an Therapist, sports. The patient has 7 gch. She is a Psychologist, forensic    ADVANCED DIRECTIVES: in place; the patient's son [name] is her Lily: Social History  Substance Use Topics  . Smoking status: Never Smoker  . Smokeless tobacco: Never Used  . Alcohol use No     Colonoscopy:  PAP:  Bone density:  Lipid panel:  Allergies  Allergen Reactions  . Cozaar [Losartan] Other (See Comments)    Cause pt to lose her taste for foods  . Codeine Itching and Other (See Comments)    Makes feel crazy  PT STATES SHE ALSO CAN NOT TAKE THE SYNTHETIC CODEINES  . Hydrocodone Itching    Tolerates with benadryl  . Oxycodone Itching  . Tetanus Toxoids Swelling and Rash    Current Outpatient Prescriptions  Medication Sig Dispense Refill  . amLODipine (NORVASC) 2.5 MG tablet TAKE ONE TABLET BY MOUTH ONCE DAILY 90 tablet 5  . Cyanocobalamin 1000 MCG/ML KIT Inject 1,000 mcg as directed every 30 (thirty) days. B 12 shot 10 kit 1  . hydrochlorothiazide (HYDRODIURIL) 25 MG tablet Take 1 tablet (25 mg total) by mouth daily. Yearly physical due in Nov must see MD for refills 90 tablet 0  . Melatonin 5 MG TABS Take 2.5 mg by mouth at bedtime.    . Multiple Vitamin (MULTIVITAMIN WITH MINERALS) TABS tablet Take 1 tablet by mouth daily.    . pantoprazole (PROTONIX) 40 MG tablet Take 1 tablet (40  mg total) by mouth every morning. 90 tablet 4  . valACYclovir (VALTREX) 1000 MG tablet Take 1,000 mg by mouth 2 (two) times daily.     . Venlafaxine HCl 150 MG TB24 TAKE ONE TABLET BY MOUTH ONCE DAILY 30 tablet 5  . Vitamin D, Ergocalciferol, (DRISDOL) 50000 units CAPS capsule Take 1 capsule (50,000 Units total) by mouth See admin instructions. Every other week. 30 capsule 1   No current facility-administered medications for this visit.    Facility-Administered Medications Ordered in Other Visits  Medication Dose Route Frequency Provider Last Rate Last Dose  . romiPLOStim (NPLATE) injection 60 mcg  60 mcg Subcutaneous Once [name], MD        OBJECTIVE: Older white woman who appears stated age 49:   05/27/16 1031  BP: 138/75  Pulse: 78  Resp: 17  Temp: 97.5  F (36.4 C)     Body mass index is 21.18 kg/m.    ECOG FS:1 - Symptomatic but completely ambulatory  Sclerae unicteric, EOMs intact Oropharynx clear and moist No cervical or supraclavicular adenopathy Lungs no rales or rhonchi Heart regular rate and rhythm Abd soft, nontender, positive bowel sounds MSK no focal spinal tenderness, no upper extremity lymphedema, uses a cane for balance Neuro: nonfocal, well oriented, appropriate affect Breasts: Deferred    LAB RESULTS:  CMP     Component Value Date/Time   NA 135 (L) 05/03/2016 0937   K 3.5 05/03/2016 0937   CL 103 02/29/2016 0820   CO2 29 05/03/2016 0937   GLUCOSE 113 05/03/2016 0937   BUN 10.6 05/03/2016 0937   CREATININE 0.9 05/03/2016 0937   CALCIUM 9.6 05/03/2016 0937   PROT 6.4 05/03/2016 0937   ALBUMIN 3.3 (L) 05/03/2016 0937   AST 16 05/03/2016 0937   ALT 11 05/03/2016 0937   ALKPHOS 88 05/03/2016 0937   BILITOT 0.58 05/03/2016 0937   GFRNONAA >60 02/29/2016 0820   GFRAA >60 02/29/2016 0820    INo results found for: SPEP, UPEP  Lab Results  Component Value Date   WBC 1.5 (L) 05/27/2016   NEUTROABS 0.4 (LL) 05/27/2016   HGB 10.3 (L)  05/27/2016   HCT 30.5 (L) 05/27/2016   MCV 99.0 05/27/2016   PLT 34 (L) 05/27/2016      Chemistry      Component Value Date/Time   NA 135 (L) 05/03/2016 0937   K 3.5 05/03/2016 0937   CL 103 02/29/2016 0820   CO2 29 05/03/2016 0937   BUN 10.6 05/03/2016 0937   CREATININE 0.9 05/03/2016 0937      Component Value Date/Time   CALCIUM 9.6 05/03/2016 0937   ALKPHOS 88 05/03/2016 0937   AST 16 05/03/2016 0937   ALT 11 05/03/2016 0937   BILITOT 0.58 05/03/2016 0937       No results found for: LABCA2  No components found for: OIZTI458  No results for input(s): INR in the last 168 hours.  Urinalysis    Component Value Date/Time   COLORURINE YELLOW 04/21/2012 0946   APPEARANCEUR CLOUDY (A) 04/21/2012 0946   LABSPEC 1.019 04/21/2012 0946   PHURINE 7.0 04/21/2012 0946   GLUCOSEU NEGATIVE 04/21/2012 0946   HGBUR NEGATIVE 04/21/2012 0946   BILIRUBINUR NEGATIVE 04/21/2012 0946   KETONESUR NEGATIVE 04/21/2012 0946   PROTEINUR NEGATIVE 04/21/2012 0946   UROBILINOGEN 1.0 04/21/2012 0946   NITRITE NEGATIVE 04/21/2012 0946   LEUKOCYTESUR NEGATIVE 04/21/2012 0946    STUDIES:  AND MICROSCOPIC INFORMATION  No results found. .  ASSESSMENT: 80 y.o. [place] woman with acute myeloid leukemia diagnosed by bone marrow biopsy 05/25/2015, with the blast count between 17 and 30% depending on the method of determination.   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, with gain of 2 and 19  (b) consider allogenic transplant  (c) all transfusion products to be irradiated  (1) started sQ azacitidine 06/05/2015, receiving 7 doses every 28 day cycle  (a) repeat bone marrow biopsy 02/29/2016 showed a mildly hypercellular marrow with dyspoiesis, but no increase in blasts either histologically or by flow cytometry  (2) immune thrombocytopenic purpura  (a) s/p IVIG 07/06/2015 (1g/kg x 2d) with excellent response  (b) romiplostim first dose 07/07/2015, repeated weekly  as of 08/04/2015  (3) biopsy of an enlarged right axillary lymph node at [facility] 11/13/2015 was nondiagnostic.  PLAN:  Sameerah's blood  counts continue to drop despite her treatments including the romiplostim. I think she may be having progression of disease and we need to repeat a bone marrow biopsy to assess  wherewe are.  Accordingly we are holding the [place] this week. She will continue the romiplostim shots every Friday. She will see me again 06/07/2016. By then she should have had her bone marrow biopsy and flow and possibly cytogenetics results. I will also have had an opportunity to discuss her case with Dr. Nadara Mustard at [facility] [place] has a good understanding of this plan. She agrees with it. She will call with any problems that may develop before her return visit here.   [name], MD   05/27/2016 10:50 AM

## 2016-05-28 ENCOUNTER — Inpatient Hospital Stay: Payer: Medicare Other

## 2016-05-29 ENCOUNTER — Inpatient Hospital Stay: Payer: Medicare Other

## 2016-05-30 ENCOUNTER — Inpatient Hospital Stay: Payer: Medicare Other

## 2016-05-31 ENCOUNTER — Other Ambulatory Visit: Payer: Self-pay | Admitting: General Surgery

## 2016-05-31 ENCOUNTER — Ambulatory Visit (HOSPITAL_BASED_OUTPATIENT_CLINIC_OR_DEPARTMENT_OTHER): Payer: Medicare Other

## 2016-05-31 ENCOUNTER — Other Ambulatory Visit (HOSPITAL_BASED_OUTPATIENT_CLINIC_OR_DEPARTMENT_OTHER): Payer: Medicare Other

## 2016-05-31 ENCOUNTER — Inpatient Hospital Stay: Payer: Medicare Other

## 2016-05-31 VITALS — BP 138/71 | HR 88 | Temp 97.7°F | Resp 18

## 2016-05-31 DIAGNOSIS — C9200 Acute myeloblastic leukemia, not having achieved remission: Secondary | ICD-10-CM

## 2016-05-31 DIAGNOSIS — C9201 Acute myeloblastic leukemia, in remission: Secondary | ICD-10-CM

## 2016-05-31 DIAGNOSIS — D696 Thrombocytopenia, unspecified: Secondary | ICD-10-CM

## 2016-05-31 DIAGNOSIS — D693 Immune thrombocytopenic purpura: Secondary | ICD-10-CM | POA: Diagnosis not present

## 2016-05-31 DIAGNOSIS — D61818 Other pancytopenia: Secondary | ICD-10-CM

## 2016-05-31 LAB — CBC WITH DIFFERENTIAL/PLATELET
BASO%: 1.3 % (ref 0.0–2.0)
BASOS ABS: 0 10*3/uL (ref 0.0–0.1)
EOS ABS: 0.1 10*3/uL (ref 0.0–0.5)
EOS%: 6.6 % (ref 0.0–7.0)
HEMATOCRIT: 31.2 % — AB (ref 34.8–46.6)
HGB: 10.6 g/dL — ABNORMAL LOW (ref 11.6–15.9)
LYMPH#: 1.2 10*3/uL (ref 0.9–3.3)
LYMPH%: 78.8 % — ABNORMAL HIGH (ref 14.0–49.7)
MCH: 33.7 pg (ref 25.1–34.0)
MCHC: 34 g/dL (ref 31.5–36.0)
MCV: 99 fL (ref 79.5–101.0)
MONO#: 0 10*3/uL — ABNORMAL LOW (ref 0.1–0.9)
MONO%: 0.7 % (ref 0.0–14.0)
NEUT#: 0.2 10*3/uL — CL (ref 1.5–6.5)
NEUT%: 12.6 % — AB (ref 38.4–76.8)
Platelets: 62 10*3/uL — ABNORMAL LOW (ref 145–400)
RBC: 3.15 10*6/uL — ABNORMAL LOW (ref 3.70–5.45)
RDW: 19.8 % — ABNORMAL HIGH (ref 11.2–14.5)
WBC: 1.5 10*3/uL — ABNORMAL LOW (ref 3.9–10.3)

## 2016-05-31 LAB — CHCC SMEAR

## 2016-05-31 MED ORDER — ROMIPLOSTIM 250 MCG ~~LOC~~ SOLR
3.0000 ug/kg | Freq: Once | SUBCUTANEOUS | Status: AC
  Administered 2016-05-31: 190 ug via SUBCUTANEOUS
  Filled 2016-05-31: qty 0.38

## 2016-05-31 NOTE — Patient Instructions (Signed)
Romiplostim injection What is this medicine? ROMIPLOSTIM (roe mi PLOE stim) helps your body make more platelets. This medicine is used to treat low platelets caused by chronic idiopathic thrombocytopenic purpura (ITP). This medicine may be used for other purposes; ask your health care provider or pharmacist if you have questions. What should I tell my health care provider before I take this medicine? They need to know if you have any of these conditions: -cancer or myelodysplastic syndrome -low blood counts, like low white cell, platelet, or red cell counts -take medicines that treat or prevent blood clots -an unusual or allergic reaction to romiplostim, mannitol, other medicines, foods, dyes, or preservatives -pregnant or trying to get pregnant -breast-feeding How should I use this medicine? This medicine is for injection under the skin. It is given by a health care professional in a hospital or clinic setting. A special MedGuide will be given to you before your injection. Read this information carefully each time. Talk to your pediatrician regarding the use of this medicine in children. Special care may be needed. Overdosage: If you think you have taken too much of this medicine contact a poison control center or emergency room at once. NOTE: This medicine is only for you. Do not share this medicine with others. What if I miss a dose? It is important not to miss your dose. Call your doctor or health care professional if you are unable to keep an appointment. What may interact with this medicine? Interactions are not expected. This list may not describe all possible interactions. Give your health care provider a list of all the medicines, herbs, non-prescription drugs, or dietary supplements you use. Also tell them if you smoke, drink alcohol, or use illegal drugs. Some items may interact with your medicine. What should I watch for while using this medicine? Your condition will be monitored  carefully while you are receiving this medicine. Visit your prescriber or health care professional for regular checks on your progress and for the needed blood tests. It is important to keep all appointments. What side effects may I notice from receiving this medicine? Side effects that you should report to your doctor or health care professional as soon as possible: -allergic reactions like skin rash, itching or hives, swelling of the face, lips, or tongue -shortness of breath, chest pain, swelling in a leg -unusual bleeding or bruising Side effects that usually do not require medical attention (report to your doctor or health care professional if they continue or are bothersome): -dizziness -headache -muscle aches -pain in arms and legs -stomach pain -trouble sleeping This list may not describe all possible side effects. Call your doctor for medical advice about side effects. You may report side effects to FDA at 1-800-FDA-1088. Where should I keep my medicine? This drug is given in a hospital or clinic and will not be stored at home. NOTE: This sheet is a summary. It may not cover all possible information. If you have questions about this medicine, talk to your doctor, pharmacist, or health care provider.    2016, Elsevier/Gold Standard. (2008-02-08 15:13:04)  

## 2016-06-03 ENCOUNTER — Ambulatory Visit (HOSPITAL_COMMUNITY)
Admission: RE | Admit: 2016-06-03 | Discharge: 2016-06-03 | Disposition: A | Discharge status: Home / Self Care | Payer: Medicare Other | Source: Ambulatory Visit | Attending: Oncology | Admitting: Oncology

## 2016-06-03 ENCOUNTER — Encounter (HOSPITAL_COMMUNITY): Payer: Self-pay

## 2016-06-03 ENCOUNTER — Inpatient Hospital Stay: Payer: Medicare Other

## 2016-06-03 ENCOUNTER — Other Ambulatory Visit: Payer: Self-pay | Admitting: Oncology

## 2016-06-03 DIAGNOSIS — I10 Essential (primary) hypertension: Secondary | ICD-10-CM | POA: Insufficient documentation

## 2016-06-03 DIAGNOSIS — D696 Thrombocytopenia, unspecified: Secondary | ICD-10-CM | POA: Diagnosis not present

## 2016-06-03 DIAGNOSIS — K219 Gastro-esophageal reflux disease without esophagitis: Secondary | ICD-10-CM | POA: Diagnosis not present

## 2016-06-03 DIAGNOSIS — F329 Major depressive disorder, single episode, unspecified: Secondary | ICD-10-CM | POA: Insufficient documentation

## 2016-06-03 DIAGNOSIS — Z888 Allergy status to other drugs, medicaments and biological substances status: Secondary | ICD-10-CM | POA: Insufficient documentation

## 2016-06-03 DIAGNOSIS — Z9889 Other specified postprocedural states: Secondary | ICD-10-CM | POA: Diagnosis not present

## 2016-06-03 DIAGNOSIS — Z79899 Other long term (current) drug therapy: Secondary | ICD-10-CM | POA: Diagnosis not present

## 2016-06-03 DIAGNOSIS — Z96651 Presence of right artificial knee joint: Secondary | ICD-10-CM | POA: Insufficient documentation

## 2016-06-03 DIAGNOSIS — D61818 Other pancytopenia: Secondary | ICD-10-CM | POA: Insufficient documentation

## 2016-06-03 DIAGNOSIS — C9200 Acute myeloblastic leukemia, not having achieved remission: Secondary | ICD-10-CM | POA: Diagnosis not present

## 2016-06-03 DIAGNOSIS — F419 Anxiety disorder, unspecified: Secondary | ICD-10-CM | POA: Insufficient documentation

## 2016-06-03 LAB — BONE MARROW EXAM

## 2016-06-03 LAB — CBC
HEMATOCRIT: 27.3 % — AB (ref 36.0–46.0)
HEMOGLOBIN: 9.7 g/dL — AB (ref 12.0–15.0)
MCH: 34 pg (ref 26.0–34.0)
MCHC: 35.5 g/dL (ref 30.0–36.0)
MCV: 95.8 fL (ref 78.0–100.0)
Platelets: 71 10*3/uL — ABNORMAL LOW (ref 150–400)
RBC: 2.85 MIL/uL — ABNORMAL LOW (ref 3.87–5.11)
RDW: 19.5 % — ABNORMAL HIGH (ref 11.5–15.5)
WBC: 1.3 10*3/uL — AB (ref 4.0–10.5)

## 2016-06-03 LAB — PROTIME-INR
INR: 1.02
Prothrombin Time: 13.4 seconds (ref 11.4–15.2)

## 2016-06-03 LAB — APTT: APTT: 32 s (ref 24–36)

## 2016-06-03 MED ORDER — FLUMAZENIL 0.5 MG/5ML IV SOLN
INTRAVENOUS | Status: AC
  Filled 2016-06-03: qty 5

## 2016-06-03 MED ORDER — MIDAZOLAM HCL 2 MG/2ML IJ SOLN
INTRAMUSCULAR | Status: AC | PRN
  Administered 2016-06-03 (×2): 1 mg via INTRAVENOUS

## 2016-06-03 MED ORDER — NALOXONE HCL 0.4 MG/ML IJ SOLN
INTRAMUSCULAR | Status: AC
  Filled 2016-06-03: qty 1

## 2016-06-03 MED ORDER — FENTANYL CITRATE (PF) 100 MCG/2ML IJ SOLN
INTRAMUSCULAR | Status: AC
  Filled 2016-06-03: qty 2

## 2016-06-03 MED ORDER — FENTANYL CITRATE (PF) 100 MCG/2ML IJ SOLN
INTRAMUSCULAR | Status: AC | PRN
  Administered 2016-06-03 (×2): 50 ug via INTRAVENOUS

## 2016-06-03 MED ORDER — SODIUM CHLORIDE 0.9 % IV SOLN
INTRAVENOUS | Status: DC
  Administered 2016-06-03: 08:00:00 via INTRAVENOUS

## 2016-06-03 MED ORDER — MIDAZOLAM HCL 2 MG/2ML IJ SOLN
INTRAMUSCULAR | Status: AC
  Filled 2016-06-03: qty 4

## 2016-06-03 NOTE — H&P (Signed)
Referring Physician(s): [name]  Supervising Physician: Daryll Brod  Patient Status:  [facility]  Chief Complaint:  "I'm here for another bone marrow biopsy"  Subjective: Patient familiar to IR service from prior bone marrow biopsies in December 2016 and September 2017. She has a history of AML and continues to have a drop in her blood counts despite treatment. She presents again today for repeat CT-guided bone marrow biopsy to rule out progression of disease. She is currently asymptomatic.  Past Medical History:  Diagnosis Date  . Anemia   . Anxiety   . Arthritis    "hand joints; hips; back" (05/23/2015)  . Cancer ([place])    dec. 2016-leukemia  . Chronic lower back pain   . Depression   . GERD (gastroesophageal reflux disease)   . History of blood transfusion 05/23/2015   "Hgb 5.9"  . Hypertension   . Shortness of breath dyspnea    with exertion   Past Surgical History:  Procedure Laterality Date  . Bone spur     R thigh  . CATARACT EXTRACTION W/ INTRAOCULAR LENS  IMPLANT, BILATERAL  ~ 2005  . DILATION AND CURETTAGE OF UTERUS    . ESOPHAGOGASTRODUODENOSCOPY (EGD) WITH PROPOFOL N/A 11/08/2014   Procedure: ESOPHAGOGASTRODUODENOSCOPY (EGD) WITH PROPOFOL;  Surgeon: Garlan Fair, MD;  [facility];  Service: Endoscopy;  Laterality: N/A;  . JOINT REPLACEMENT    . TONSILLECTOMY  ~ 1950  . TOTAL KNEE ARTHROPLASTY  04/27/2012   Procedure: TOTAL KNEE ARTHROPLASTY;  Surgeon: Mauri Pole, MD;  Location[facility];  Service: Orthopedics;  Laterality: Right;  . TOTAL SHOULDER ARTHROPLASTY  09/13/2011   Procedure: TOTAL SHOULDER ARTHROPLASTY;  Surgeon: Augustin Schooling, MD;  Location: [place];  Service: Orthopedics;  Laterality: Left;  LEFT SHOULDER REVERSED TOTAL SHOULDER ARTHROPLASTY     Allergies: Cozaar [losartan]; Codeine; Hydrocodone; Oxycodone; and Tetanus toxoids  Medications: Prior to Admission medications   Medication Sig Start Date End Date Taking?  Authorizing Provider  amLODipine (NORVASC) 2.5 MG tablet TAKE ONE TABLET BY MOUTH ONCE DAILY 10/02/15  Yes Minus Breeding, MD  Cyanocobalamin 1000 MCG/ML KIT Inject 1,000 mcg as directed every 30 (thirty) days. B 12 shot 08/22/15  Yes Golden Circle, FNP  hydrochlorothiazide (HYDRODIURIL) 25 MG tablet Take 1 tablet (25 mg total) by mouth daily. Yearly physical due in Nov must see MD for refills 03/11/16  Yes Golden Circle, FNP  Melatonin 5 MG TABS Take 2.5 mg by mouth at bedtime.   Yes Historical Provider, MD  Multiple Vitamin (MULTIVITAMIN WITH MINERALS) TABS tablet Take 1 tablet by mouth daily.   Yes Historical Provider, MD  pantoprazole (PROTONIX) 40 MG tablet Take 1 tablet (40 mg total) by mouth every morning. 04/26/16  Yes [name], MD  Venlafaxine HCl 150 MG TB24 TAKE ONE TABLET BY MOUTH ONCE DAILY 05/13/16  Yes Golden Circle, FNP  Vitamin D, Ergocalciferol, (DRISDOL) 50000 units CAPS capsule Take 1 capsule (50,000 Units total) by mouth See admin instructions. Every other week. 07/21/15  Yes Golden Circle, FNP  valACYclovir (VALTREX) 1000 MG tablet Take 1,000 mg by mouth 2 (two) times daily.  06/30/15   Historical Provider, MD     Vital Signs: BP 115/63 (BP Location: Left Arm)   Pulse 81   Temp 97.7 F (36.5 C) (Oral)   Resp 16   SpO2 99%   Physical Exam Patient awake, alert. Chest clear to auscultation bilaterally. Heart with regular rate and  rhythm. Abdomen soft, positive bowel sounds, nontender. Lower extremities- no edema.  Imaging: No results found.  Labs:  CBC:  Recent Labs  05/17/16 1007 05/24/16 1124 05/27/16 1021 05/31/16 1032  WBC 2.2* 1.6* 1.5* 1.5*  HGB 10.1* 10.0* 10.3* 10.6*  HCT 30.7* 30.1* 30.5* 31.2*  PLT 49* 37* 34* 62*    COAGS:  Recent Labs  02/29/16 0820  INR 1.13    BMP:  Recent Labs  02/29/16 0820  03/11/16 1137 04/09/16 1122 05/03/16 0937 05/27/16 1021  NA 137  < > 140 139 135* 140  K 4.9  < > 3.8 3.2* 3.5 3.5  CL  103  --   --   --   --   --   CO2 25  < > '29 27 29 27  '$ GLUCOSE 89  < > 79 78 113 76  BUN 16  < > 13.7 15.3 10.6 9.3  CALCIUM 9.1  < > 9.5 9.4 9.6 9.7  CREATININE 0.89  < > 0.8 0.9 0.9 0.8  GFRNONAA >60  --   --   --   --   --   GFRAA >60  --   --   --   --   --   < > = values in this interval not displayed.  LIVER FUNCTION TESTS:  Recent Labs  03/11/16 1137 04/09/16 1122 05/03/16 0937 05/27/16 1021  BILITOT 0.36 0.47 0.58 0.65  AST '21 17 16 17  '$ ALT '15 12 11 13  '$ ALKPHOS 111 89 88 77  PROT 6.6 6.9 6.4 6.4  ALBUMIN 3.4* 3.6 3.3* 3.4*    Assessment and Plan: Pt with history of AML who continues to have  drop in her blood counts despite treatment. She presents again today for repeat CT-guided bone marrow biopsy to rule out progression of disease.Risks and benefits discussed with the patient/daughter including, but not limited to bleeding, infection, damage to adjacent structures or low yield requiring additional tests.All of the patient's questions were answered, patient is agreeable to proceed.Consent signed and in chart.      Electronically Signed: D. Rowe Robert 06/03/2016, 8:20 AM   I spent a total of 20 minutes at the the patient's bedside AND on the patient's hospital floor or unit, greater than 50% of which was counseling/coordinating care for CT guided bone marrow biopsy

## 2016-06-03 NOTE — Discharge Instructions (Signed)
Bone Marrow Aspiration and Bone Marrow Biopsy, Adult, Care After °This sheet gives you information about how to care for yourself after your procedure. Your health care provider may also give you more specific instructions. If you have problems or questions, contact your health care provider. °What can I expect after the procedure? °After the procedure, it is common to have: °· Mild pain and tenderness. °· Swelling. °· Bruising. °Follow these instructions at home: °· Take over-the-counter or prescription medicines only as told by your health care provider. °· Do not take baths, swim, or use a hot tub until your health care provider approves. Ask if you can take a shower or have a sponge bath. °· Follow instructions from your health care provider about how to take care of the puncture site. Make sure you: °¨ Wash your hands with soap and water before you change your bandage (dressing). If soap and water are not available, use hand sanitizer. °¨ Change your dressing as told by your health care provider. °· Check your puncture site every day for signs of infection. Check for: °¨ More redness, swelling, or pain. °¨ More fluid or blood. °¨ Warmth. °¨ Pus or a bad smell. °· Return to your normal activities as told by your health care provider. Ask your health care provider what activities are safe for you. °· Do not drive for 24 hours if you were given a medicine to help you relax (sedative). °· Keep all follow-up visits as told by your health care provider. This is important. °Contact a health care provider if: °· You have more redness, swelling, or pain around the puncture site. °· You have more fluid or blood coming from the puncture site. °· Your puncture site feels warm to the touch. °· You have pus or a bad smell coming from the puncture site. °· You have a fever. °· Your pain is not controlled with medicine. °This information is not intended to replace advice given to you by your health care provider. Make sure you  discuss any questions you have with your health care provider. °Document Released: 12/28/2004 Document Revised: 12/29/2015 Document Reviewed: 11/22/2015 °Elsevier Interactive Patient Education © 2017 Elsevier Inc[employer] °Moderate Conscious Sedation, Adult, Care After °These instructions provide you with information about caring for yourself after your procedure. Your health care provider may also give you more specific instructions. Your treatment has been planned according to current medical practices, but problems sometimes occur. Call your health care provider if you have any problems or questions after your procedure. °What can I expect after the procedure? °After your procedure, it is common: °· To feel sleepy for several hours. °· To feel clumsy and have poor balance for several hours. °· To have poor judgment for several hours. °· To vomit if you eat too soon. °Follow these instructions at home: °For at least 24 hours after the procedure:  °· Do not: °¨ Participate in activities where you could fall or become injured. °¨ Drive. °¨ Use heavy machinery. °¨ Drink alcohol. °¨ Take sleeping pills or medicines that cause drowsiness. °¨ Make important decisions or sign legal documents. °¨ Take care of children on your own. °· Rest. °Eating and drinking °· Follow the diet recommended by your health care provider. °· If you vomit: °¨ Drink water, juice, or soup when you can drink without vomiting. °¨ Make sure you have little or no nausea before eating solid foods. °General instructions °· Have a responsible adult stay with you until you are awake and   alert.  Take over-the-counter and prescription medicines only as told by your health care provider.  If you smoke, do not smoke without supervision.  Keep all follow-up visits as told by your health care provider. This is important. Contact a health care provider if:  You keep feeling nauseous or you keep vomiting.  You feel light-headed.  You develop a  rash.  You have a fever. Get help right away if:  You have trouble breathing. This information is not intended to replace advice given to you by your health care provider. Make sure you discuss any questions you have with your health care provider. Document Released: 03/31/2013 Document Revised: 11/13/2015 Document Reviewed: 09/30/2015 Elsevier Interactive Patient Education  2017 [employer].

## 2016-06-03 NOTE — Procedures (Signed)
Leukemia, thrombocytopenia  S/p CT BM ASP AND CORE BX  No comp Stable Path pending Full report in PACS

## 2016-06-03 NOTE — Progress Notes (Signed)
CRITICAL VALUE ALERT  Critical value received:  WBC 1.3  Date of notification:  06/03/16  Time of notification:  08:23  Critical value read back: YES  Nurse who received alert:  Lavon Paganini  MD notified (1st page):  Rowe Robert, PA  Time of first page:  08:23  MD notified (2nd page):  Time of second page:  Responding MD:  Rowe Robert  Time MD responded:  08:23

## 2016-06-04 ENCOUNTER — Inpatient Hospital Stay: Payer: Medicare Other

## 2016-06-05 ENCOUNTER — Encounter: Payer: Self-pay | Admitting: Oncology

## 2016-06-05 ENCOUNTER — Other Ambulatory Visit: Payer: Self-pay | Admitting: Oncology

## 2016-06-05 DIAGNOSIS — Z7189 Other specified counseling: Secondary | ICD-10-CM | POA: Insufficient documentation

## 2016-06-05 MED ORDER — LENALIDOMIDE 10 MG PO CAPS: 10.0000 mg | ORAL_CAPSULE | Freq: Every day | ORAL | 6 refills | Status: DC

## 2016-06-07 ENCOUNTER — Other Ambulatory Visit (HOSPITAL_BASED_OUTPATIENT_CLINIC_OR_DEPARTMENT_OTHER): Payer: Medicare Other

## 2016-06-07 ENCOUNTER — Ambulatory Visit (HOSPITAL_BASED_OUTPATIENT_CLINIC_OR_DEPARTMENT_OTHER): Payer: Medicare Other

## 2016-06-07 ENCOUNTER — Ambulatory Visit (HOSPITAL_BASED_OUTPATIENT_CLINIC_OR_DEPARTMENT_OTHER): Payer: Medicare Other | Admitting: Oncology

## 2016-06-07 ENCOUNTER — Other Ambulatory Visit: Payer: Self-pay | Admitting: *Deleted

## 2016-06-07 VITALS — BP 136/65 | HR 85 | Temp 97.6°F | Resp 18 | Ht 68.0 in | Wt 141.8 lb

## 2016-06-07 DIAGNOSIS — C9201 Acute myeloblastic leukemia, in remission: Secondary | ICD-10-CM

## 2016-06-07 DIAGNOSIS — D693 Immune thrombocytopenic purpura: Secondary | ICD-10-CM

## 2016-06-07 DIAGNOSIS — C9200 Acute myeloblastic leukemia, not having achieved remission: Secondary | ICD-10-CM

## 2016-06-07 DIAGNOSIS — D61818 Other pancytopenia: Secondary | ICD-10-CM

## 2016-06-07 DIAGNOSIS — D696 Thrombocytopenia, unspecified: Secondary | ICD-10-CM

## 2016-06-07 DIAGNOSIS — C9202 Acute myeloblastic leukemia, in relapse: Secondary | ICD-10-CM

## 2016-06-07 LAB — CBC WITH DIFFERENTIAL/PLATELET
BASO%: 0.9 % (ref 0.0–2.0)
BASOS ABS: 0 10*3/uL (ref 0.0–0.1)
EOS ABS: 0 10*3/uL (ref 0.0–0.5)
EOS%: 3.3 % (ref 0.0–7.0)
HCT: 30.7 % — ABNORMAL LOW (ref 34.8–46.6)
HGB: 10.1 g/dL — ABNORMAL LOW (ref 11.6–15.9)
LYMPH%: 53.8 % — AB (ref 14.0–49.7)
MCH: 34 pg (ref 25.1–34.0)
MCHC: 33.1 g/dL (ref 31.5–36.0)
MCV: 102.9 fL — AB (ref 79.5–101.0)
MONO#: 0.1 10*3/uL (ref 0.1–0.9)
MONO%: 4.9 % (ref 0.0–14.0)
NEUT#: 0.5 10*3/uL — CL (ref 1.5–6.5)
NEUT%: 37.1 % — AB (ref 38.4–76.8)
PLATELETS: 85 10*3/uL — AB (ref 145–400)
RBC: 2.98 10*6/uL — ABNORMAL LOW (ref 3.70–5.45)
RDW: 22.7 % — ABNORMAL HIGH (ref 11.2–14.5)
WBC: 1.5 10*3/uL — ABNORMAL LOW (ref 3.9–10.3)
lymph#: 0.8 10*3/uL — ABNORMAL LOW (ref 0.9–3.3)

## 2016-06-07 LAB — CHCC SMEAR

## 2016-06-07 MED ORDER — ROMIPLOSTIM 250 MCG ~~LOC~~ SOLR
3.0000 ug/kg | Freq: Once | SUBCUTANEOUS | Status: AC
  Administered 2016-06-07: 195 ug via SUBCUTANEOUS
  Filled 2016-06-07: qty 0.39

## 2016-06-07 MED ORDER — LENALIDOMIDE 10 MG PO CAPS: 10.0000 mg | ORAL_CAPSULE | Freq: Every day | ORAL | 0 refills | Status: DC

## 2016-06-07 NOTE — Patient Instructions (Signed)
Romiplostim injection What is this medicine? ROMIPLOSTIM (roe mi PLOE stim) helps your body make more platelets. This medicine is used to treat low platelets caused by chronic idiopathic thrombocytopenic purpura (ITP). This medicine may be used for other purposes; ask your health care provider or pharmacist if you have questions. What should I tell my health care provider before I take this medicine? They need to know if you have any of these conditions: -cancer or myelodysplastic syndrome -low blood counts, like low white cell, platelet, or red cell counts -take medicines that treat or prevent blood clots -an unusual or allergic reaction to romiplostim, mannitol, other medicines, foods, dyes, or preservatives -pregnant or trying to get pregnant -breast-feeding How should I use this medicine? This medicine is for injection under the skin. It is given by a health care professional in a hospital or clinic setting. A special MedGuide will be given to you before your injection. Read this information carefully each time. Talk to your pediatrician regarding the use of this medicine in children. Special care may be needed. Overdosage: If you think you have taken too much of this medicine contact a poison control center or emergency room at once. NOTE: This medicine is only for you. Do not share this medicine with others. What if I miss a dose? It is important not to miss your dose. Call your doctor or health care professional if you are unable to keep an appointment. What may interact with this medicine? Interactions are not expected. This list may not describe all possible interactions. Give your health care provider a list of all the medicines, herbs, non-prescription drugs, or dietary supplements you use. Also tell them if you smoke, drink alcohol, or use illegal drugs. Some items may interact with your medicine. What should I watch for while using this medicine? Your condition will be monitored  carefully while you are receiving this medicine. Visit your prescriber or health care professional for regular checks on your progress and for the needed blood tests. It is important to keep all appointments. What side effects may I notice from receiving this medicine? Side effects that you should report to your doctor or health care professional as soon as possible: -allergic reactions like skin rash, itching or hives, swelling of the face, lips, or tongue -shortness of breath, chest pain, swelling in a leg -unusual bleeding or bruising Side effects that usually do not require medical attention (report to your doctor or health care professional if they continue or are bothersome): -dizziness -headache -muscle aches -pain in arms and legs -stomach pain -trouble sleeping This list may not describe all possible side effects. Call your doctor for medical advice about side effects. You may report side effects to FDA at 1-800-FDA-1088. Where should I keep my medicine? This drug is given in a hospital or clinic and will not be stored at home. NOTE: This sheet is a summary. It may not cover all possible information. If you have questions about this medicine, talk to your doctor, pharmacist, or health care provider.    2016, Elsevier/Gold Standard. (2008-02-08 15:13:04)  

## 2016-06-07 NOTE — Telephone Encounter (Signed)
This RN registered pt per [employer] for Revlimid.  Pt's [employer] register is [id number] password [id number]  Obtained authorization # [id number]  E scribed new prescription to [facility] in [place].

## 2016-06-08 NOTE — Progress Notes (Signed)
[name]     ID: [name] DOB: [birth year]  MR#: [id number]  OAI#:[id number]  Patient Care Team: Veryl Speak, FNP as PCP - General (Family Medicine) PCP: Jeanine Luz, FNP, Cheryll Cockayne GYN: SU:  OTHER MD: Durene Romans, Danise Edge, Dianna Dorene Sorrow  CHIEF COMPLAINT: Acute myeloid leukemia; ITP  CURRENT TREATMENT: lenalidomide, romiplostim  HISTORY OF PRESENT ILLNESS: From the 05/22/2025 consult note:  "The patient was evaluated at her PCP's office 05/22/2015 with a complaint of DOE worsening over several months. Labwork was obtained including a CBC, whioch showed WBC 1.8, platelets 14K, and Hb 5.9 with an MCV of 97.5. DDimer was 1.04. Accordingly the patient was referred to the ED and was admitted 05/23/2015.   Labwork since admission includes a repeat CBC with WBC 1.7, HB 5.4, MCV 100.6 and platelets 10K. Creat was 1.04 with GFR 50. Reticulocytes are not elevated with abs 39.3 (1.7%) and LDH is normal at 188. Other labs are pending.  The patient tells me because of peripheral neuropathy she has been receiving monthly B-12 shots since 2005."  Bone marrow biopsy 05/25/2015 showed acute myeloid leukemia, with 17% blasts by flow cytometry, 24% by aspirate counts, and 20-30% by immunohistochemistry ([id number], and 898). The blasts were positive for CD 117 and focally for MPO. There were also myelodysplasia related changes in all 3 cell lines.  The patient's subsequent history is as detailed below.  INTERVAL HISTORY: [name] returns today for follow-up of her acute leukemia/myelodysplasia accompanied by her friend [name]. The patient's 3 children [name], who lives in [place], [name] who lives in [place], and [name] who lives in [place]) participated through the meeting via Facetime.[name] tolerated her bone marrow biopsy 06/03/2016 with no side effects or complications. The pathology ([id number], [id number]) showed a slightly hypercellular  marrow with recurrent acute myeloid leukemia. The aspirate showed 27% blastic cells. There were no hAuer rods. Flow cytometry found 17% abnormal cells which we are HLA-DR, CD11c, CD13, CD22, CD33, CD34 and CD 117 positive. [name] is here today to discuss those results.  REVIEW OF SYSTEMS: She feels remarkably well. She has some heartburn problems, some stress urinary incontinence, and some difficulty walking, which is not new. She uses a cane chronically. She is a little forgetful and a little depressed by her own account. Of course she lost her husband within the last 2 months. She has had no intercurrent fever or bleeding problems. A detailed review of systems today was otherwise stable  PAST MEDICAL HISTORY: Past Medical History:  Diagnosis Date  . Anemia   . Anxiety   . Arthritis    "hand joints; hips; back" (05/23/2015)  . Cancer (HCC)    dec. 2016-leukemia  . Chronic lower back pain   . Depression   . GERD (gastroesophageal reflux disease)   . History of blood transfusion 05/23/2015   "Hgb 5.9"  . Hypertension   . Shortness of breath dyspnea    with exertion    PAST SURGICAL HISTORY: Past Surgical History:  Procedure Laterality Date  . Bone spur     R thigh  . CATARACT EXTRACTION W/ INTRAOCULAR LENS  IMPLANT, BILATERAL  ~ 2005  . DILATION AND CURETTAGE OF UTERUS    . ESOPHAGOGASTRODUODENOSCOPY (EGD) WITH PROPOFOL N/A 11/08/2014   Procedure: ESOPHAGOGASTRODUODENOSCOPY (EGD) WITH PROPOFOL;  Surgeon: Charolett Bumpers, MD;  Location: [facility];  Service: Endoscopy;  Laterality: N/A;  . JOINT REPLACEMENT    . TONSILLECTOMY  ~  [place] ARTHROPLASTY  04/27/2012   Procedure: TOTAL KNEE ARTHROPLASTY;  Surgeon: Mauri Pole, MD;  Location: [facility];  Service: Orthopedics;  Laterality: Right;  . TOTAL SHOULDER ARTHROPLASTY  09/13/2011   Procedure: TOTAL SHOULDER ARTHROPLASTY;  Surgeon: Augustin Schooling, MD;  Location: [facility];  Service: Orthopedics;  Laterality: Left;  LEFT  SHOULDER REVERSED TOTAL SHOULDER ARTHROPLASTY    FAMILY HISTORY Family History  Problem Relation Age of Onset  . Heart attack Mother 21    Died age 54  . Heart disease Brother     Atrial fib  Patient's father died age 71 with prostate cancer; mother died atv79 with an MI. One brother, alive at 21; one sister with parkinson's. No blood or cancer problems otherwise in family  GYNECOLOGIC HISTORY:  No LMP recorded. Patient is postmenopausal. Menarche age 49, first live birth age 13, she is GXP3; menopause age 80, no HR  SOCIAL HISTORY:  Grade school Pharmacist, hospital, retired; husband was a Higher education careers adviser. He has severe heart disease and she is his main caretaker. Son [name] works for [employer] and sings in the [employer]; daughter [name] lives in [place], a homemaker; daughter [name] works at the [employer] in [employer] as an Therapist, sports. The patient has 7 gch. She is a Psychologist, forensic    ADVANCED DIRECTIVES: in place; the patient's son [name] is her Council: Social History  Substance Use Topics  . Smoking status: Never Smoker  . Smokeless tobacco: Never Used  . Alcohol use No     Colonoscopy:  PAP:  Bone density:  Lipid panel:  Allergies  Allergen Reactions  . Cozaar [Losartan] Other (See Comments)    Cause pt to lose her taste for foods  . Codeine Itching and Other (See Comments)    Makes feel crazy  PT STATES SHE ALSO CAN NOT TAKE THE SYNTHETIC CODEINES  . Hydrocodone Itching    Tolerates with benadryl  . Oxycodone Itching  . Tetanus Toxoids Swelling and Rash    Current Outpatient Prescriptions  Medication Sig Dispense Refill  . amLODipine (NORVASC) 2.5 MG tablet TAKE ONE TABLET BY MOUTH ONCE DAILY 90 tablet 5  . Cyanocobalamin 1000 MCG/ML KIT Inject 1,000 mcg as directed every 30 (thirty) days. B 12 shot 10 kit 1  . hydrochlorothiazide (HYDRODIURIL) 25 MG tablet Take 1 tablet (25 mg total) by mouth daily. Yearly physical due in Nov must see MD for refills 90 tablet 0    . lenalidomide (REVLIMID) 10 MG capsule Take 1 capsule (10 mg total) by mouth daily. 21 capsule 0  . Melatonin 5 MG TABS Take 2.5 mg by mouth at bedtime.    . Multiple Vitamin (MULTIVITAMIN WITH MINERALS) TABS tablet Take 1 tablet by mouth daily.    . pantoprazole (PROTONIX) 40 MG tablet Take 1 tablet (40 mg total) by mouth every morning. 90 tablet 4  . valACYclovir (VALTREX) 1000 MG tablet Take 1,000 mg by mouth 2 (two) times daily.     . Venlafaxine HCl 150 MG TB24 TAKE ONE TABLET BY MOUTH ONCE DAILY 30 tablet 5  . Vitamin D, Ergocalciferol, (DRISDOL) 50000 units CAPS capsule Take 1 capsule (50,000 Units total) by mouth See admin instructions. Every other week. 30 capsule 1   No current facility-administered medications for this visit.    Facility-Administered Medications Ordered in Other Visits  Medication Dose Route Frequency Provider Last Rate Last Dose  . romiPLOStim (NPLATE) injection 60 mcg  60 mcg  Subcutaneous Once [name], MD        OBJECTIVE: Older white woman In no acute distress  Vitals:   06/07/16 1108  BP: 136/65  Pulse: 85  Resp: 18  Temp: 97.6 F (36.4 C)     Body mass index is 21.56 kg/m.    ECOG FS:1 - Symptomatic but completely ambulatory  Sclerae unicteric, pupils round and equal Oropharynx clear and moist-- no thrush or other lesions No cervical or supraclavicular adenopathy Lungs no rales or rhonchi Heart regular rate and rhythm Abd soft, nontender, positive bowel sounds MSK no focal spinal tenderness, no upper extremity lymphedema Neuro: nonfocal, well oriented, appropriate affect Breasts: Deferred  LAB RESULTS:  CMP     Component Value Date/Time   NA 140 05/27/2016 1021   K 3.5 05/27/2016 1021   CL 103 02/29/2016 0820   CO2 27 05/27/2016 1021   GLUCOSE 76 05/27/2016 1021   BUN 9.3 05/27/2016 1021   CREATININE 0.8 05/27/2016 1021   CALCIUM 9.7 05/27/2016 1021   PROT 6.4 05/27/2016 1021   ALBUMIN 3.4 (L) 05/27/2016 1021   AST 17  05/27/2016 1021   ALT 13 05/27/2016 1021   ALKPHOS 77 05/27/2016 1021   BILITOT 0.65 05/27/2016 1021   GFRNONAA >60 02/29/2016 0820   GFRAA >60 02/29/2016 0820    INo results found for: SPEP, UPEP  Lab Results  Component Value Date   WBC 1.5 (L) 06/07/2016   NEUTROABS 0.5 (LL) 06/07/2016   HGB 10.1 (L) 06/07/2016   HCT 30.7 (L) 06/07/2016   MCV 102.9 (H) 06/07/2016   PLT 85 (L) 06/07/2016      Chemistry      Component Value Date/Time   NA 140 05/27/2016 1021   K 3.5 05/27/2016 1021   CL 103 02/29/2016 0820   CO2 27 05/27/2016 1021   BUN 9.3 05/27/2016 1021   CREATININE 0.8 05/27/2016 1021      Component Value Date/Time   CALCIUM 9.7 05/27/2016 1021   ALKPHOS 77 05/27/2016 1021   AST 17 05/27/2016 1021   ALT 13 05/27/2016 1021   BILITOT 0.65 05/27/2016 1021       No results found for: LABCA2  No components found for: LABCA125   Recent Labs Lab 06/03/16 0751  INR 1.02    Urinalysis    Component Value Date/Time   COLORURINE YELLOW 04/21/2012 0946   APPEARANCEUR CLOUDY (A) 04/21/2012 0946   LABSPEC 1.019 04/21/2012 0946   PHURINE 7.0 04/21/2012 0946   GLUCOSEU NEGATIVE 04/21/2012 0946   HGBUR NEGATIVE 04/21/2012 0946   BILIRUBINUR NEGATIVE 04/21/2012 0946   KETONESUR NEGATIVE 04/21/2012 0946   PROTEINUR NEGATIVE 04/21/2012 0946   UROBILINOGEN 1.0 04/21/2012 0946   NITRITE NEGATIVE 04/21/2012 0946   LEUKOCYTESUR NEGATIVE 04/21/2012 0946    STUDIES:  AND MICROSCOPIC INFORMATION  Ct Biopsy  Result Date: 06/03/2016 INDICATION: AML, THROMBOCYTOPENIA EXAM: CT GUIDED LEFT ILIAC BONE MARROW ASPIRATION AND CORE BIOPSY Date:  12/11/201712/04/2016 9:47 am Radiologist:  M. Daryll Brod, MD Guidance:  CT FLUOROSCOPY TIME:  Fluoroscopy Time: NONE. MEDICATIONS: 1% LIDOCAINE LOCALLY ANESTHESIA/SEDATION: 2.0 mg IV Versed; 100 mcg IV Fentanyl Moderate Sedation Time:  13 MINUTES The patient was continuously monitored during the procedure by the interventional  radiology nurse under my direct supervision. CONTRAST:  None. COMPLICATIONS: None PROCEDURE: Informed consent was obtained from the patient following explanation of the procedure, risks, benefits and alternatives. The patient understands, agrees and consents for the procedure. All questions were addressed. A time out  was performed. The patient was positioned prone and non-contrast localization CT was performed of the pelvis to demonstrate the iliac marrow spaces. Maximal barrier sterile technique utilized including caps, mask, sterile gowns, sterile gloves, large sterile drape, hand hygiene, and Betadine prep. Under sterile conditions and local anesthesia, an 11 gauge coaxial bone biopsy needle was advanced into the left iliac marrow space. Needle position was confirmed with CT imaging. Initially, bone marrow aspiration was performed. Next, the 11 gauge outer cannula was utilized to obtain a left iliac bone marrow core biopsy. Needle was removed. Hemostasis was obtained with compression. The patient tolerated the procedure well. Samples were prepared with the cytotechnologist. No immediate complications. IMPRESSION: CT guided left iliac bone marrow aspiration and core biopsy. Electronically Signed   By: Judie Petit.  Shick M.D.   On: 06/03/2016 09:55   .  ASSESSMENT: 80 y.o. [place] woman with acute myeloid leukemia diagnosed by bone marrow biopsy 05/25/2015, with the blast count between 17 and 30% depending on the method of determination.   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, with gain of 2 and 19  (b) consider allogenic transplant  (c) all transfusion products to be irradiated  (1) started sQ azacitidine 06/05/2015, receiving 7 doses every 28 day cycle  (a) repeat bone marrow biopsy 02/29/2016 showed a mildly hypercellular marrow with dyspoiesis, but no increase in blasts either histologically or by flow cytometry  (b) repeat bone marrow biopsy 06/03/2016 shows recurrent acute  myeloid leukemia  (2) immune thrombocytopenic purpura  (a) s/p IVIG 07/06/2015 (1g/kg x 2d) with excellent response  (b) romiplostim first dose 07/07/2015, repeated weekly as of 08/04/2015  (3) biopsy of an enlarged right axillary lymph node at [facility] 11/13/2015 was nondiagnostic.  (4) to start lenalidomide at 10 mg daily beginning 06/11/2016  PLAN:  I spent approximately 30 minutes with the patient and her family going over her situation. We reviewed the fact that acute myeloid leukemia arising from myelodysplasia is generally not curable the treatment of acute myeloid leukemia can be very harsh and in someone GYN's age would be inappropriate. Accordingly she received azacitabine which she tolerated well. She benefited greatly from romiplostim, since her platelet count was under 20,000 chronically. As her counts started to drop despite the azacitabine we proceeded to repeat bone marrow biopsy showing recurrent acute myeloid leukemia  We again discussed the 3 questions relevant to stage IV disease namely whether we want to proceed to treatment or move to comfort care under hospice umbrella, . Anvi is very clear that she would like to be treated assuming the treatment does not significantly impact her quality of life. I think this is reasonable.  Accordingly we discussed lenalidomide given that her earlier cytogenetics showed a 5 every minus phenotype. We discussed the possible toxicities, side effects and complications of this agent and also cost issues. We have obtained approval from her insurance company for this and she will have her first shipment mailed to her early next week  At the same time there is a possibility that this chemotherapy will not be effective. If so, Kania's counts will continue to drop (they have improved some since she went off the azacitabine) and patients in that situation generally succumb to either infection or bleeding or other catastrophic developments due to the  leukemia. I went over the fact that resuscitation under those circumstances would be futile and possibly cruel. The patient wishes to have everything done that would be helpful to her  and I think that is reasonable. At the same time there was general agreement that in case of a terminal event there should be no CPR, defibrillation, or intubation.  I think she would benefit from a referral to hospice is in home palliative care program and we will place that referral early next week.  We are continuing the romiplostim weekly. That is helping her platelets stay in a safe zone. Prisilla will see me again 06/21/2016 but she knows to call for any problems that may develop before that visit.  [name], MD   06/08/2016 4:37 PM

## 2016-06-10 ENCOUNTER — Telehealth: Payer: Self-pay | Admitting: Pharmacist

## 2016-06-10 ENCOUNTER — Telehealth: Payer: Self-pay | Admitting: *Deleted

## 2016-06-10 NOTE — Telephone Encounter (Signed)
"  I received a call from [facility].  I am to begin a medicine called lenalidimide which is similar to the thalidomide that caused babies to be born without arms or legs.  Dr. Jana Hakim told me to call if I did not hear anything by tomorrow."  Advised to expect to hear from pharmacy to arrange shipment after prior authorization obtained from insurance and patient survey completed.  No further questions.

## 2016-06-10 NOTE — Telephone Encounter (Signed)
Oral Chemotherapy Pharmacist Encounter  Received notification from Dr. Jana Hakim that he plans to start Revlimid for patient for MDS with 5q deletion. Prescription was e-scribed to [facility] on 06/07/16 Last office note, insurance and demographic information faxed to [facility] on 06/10/16  Oral Oncology Clinic will continue to follow for prior authorization, copayment issues, start date, initial counseling, and toxicity and adherence management.  Johny Drilling, PharmD, BCPS, BCOP 06/10/2016  11:29 AM Oral Oncology Clinic [phone]

## 2016-06-14 ENCOUNTER — Ambulatory Visit (HOSPITAL_BASED_OUTPATIENT_CLINIC_OR_DEPARTMENT_OTHER): Payer: Medicare Other

## 2016-06-14 ENCOUNTER — Other Ambulatory Visit (HOSPITAL_BASED_OUTPATIENT_CLINIC_OR_DEPARTMENT_OTHER): Payer: Medicare Other

## 2016-06-14 VITALS — BP 148/65 | HR 82 | Temp 97.8°F | Resp 20

## 2016-06-14 DIAGNOSIS — C9201 Acute myeloblastic leukemia, in remission: Secondary | ICD-10-CM

## 2016-06-14 DIAGNOSIS — C9200 Acute myeloblastic leukemia, not having achieved remission: Secondary | ICD-10-CM

## 2016-06-14 DIAGNOSIS — D61818 Other pancytopenia: Secondary | ICD-10-CM

## 2016-06-14 DIAGNOSIS — D693 Immune thrombocytopenic purpura: Secondary | ICD-10-CM | POA: Diagnosis not present

## 2016-06-14 DIAGNOSIS — D696 Thrombocytopenia, unspecified: Secondary | ICD-10-CM

## 2016-06-14 LAB — CBC WITH DIFFERENTIAL/PLATELET
BASO%: 0 % (ref 0.0–2.0)
BASOS ABS: 0 10*3/uL (ref 0.0–0.1)
EOS%: 1.8 % (ref 0.0–7.0)
Eosinophils Absolute: 0 10*3/uL (ref 0.0–0.5)
HCT: 30.5 % — ABNORMAL LOW (ref 34.8–46.6)
HEMOGLOBIN: 10.5 g/dL — AB (ref 11.6–15.9)
LYMPH#: 1.2 10*3/uL (ref 0.9–3.3)
LYMPH%: 51.8 % — ABNORMAL HIGH (ref 14.0–49.7)
MCH: 34.5 pg — ABNORMAL HIGH (ref 25.1–34.0)
MCHC: 34.4 g/dL (ref 31.5–36.0)
MCV: 100.3 fL (ref 79.5–101.0)
MONO#: 0.1 10*3/uL (ref 0.1–0.9)
MONO%: 4.9 % (ref 0.0–14.0)
NEUT%: 41.5 % (ref 38.4–76.8)
NEUTROS ABS: 0.9 10*3/uL — AB (ref 1.5–6.5)
NRBC: 0 % (ref 0–0)
Platelets: 64 10*3/uL — ABNORMAL LOW (ref 145–400)
RBC: 3.04 10*6/uL — AB (ref 3.70–5.45)
RDW: 19.5 % — AB (ref 11.2–14.5)
WBC: 2.2 10*3/uL — AB (ref 3.9–10.3)

## 2016-06-14 LAB — CHCC SMEAR

## 2016-06-14 MED ORDER — ROMIPLOSTIM 250 MCG ~~LOC~~ SOLR
3.0000 ug/kg | Freq: Once | SUBCUTANEOUS | Status: AC
  Administered 2016-06-14: 195 ug via SUBCUTANEOUS
  Filled 2016-06-14: qty 0.39

## 2016-06-14 NOTE — Patient Instructions (Signed)
Romiplostim injection What is this medicine? ROMIPLOSTIM (roe mi PLOE stim) helps your body make more platelets. This medicine is used to treat low platelets caused by chronic idiopathic thrombocytopenic purpura (ITP). This medicine may be used for other purposes; ask your health care provider or pharmacist if you have questions. What should I tell my health care provider before I take this medicine? They need to know if you have any of these conditions: -cancer or myelodysplastic syndrome -low blood counts, like low white cell, platelet, or red cell counts -take medicines that treat or prevent blood clots -an unusual or allergic reaction to romiplostim, mannitol, other medicines, foods, dyes, or preservatives -pregnant or trying to get pregnant -breast-feeding How should I use this medicine? This medicine is for injection under the skin. It is given by a health care professional in a hospital or clinic setting. A special MedGuide will be given to you before your injection. Read this information carefully each time. Talk to your pediatrician regarding the use of this medicine in children. Special care may be needed. Overdosage: If you think you have taken too much of this medicine contact a poison control center or emergency room at once. NOTE: This medicine is only for you. Do not share this medicine with others. What if I miss a dose? It is important not to miss your dose. Call your doctor or health care professional if you are unable to keep an appointment. What may interact with this medicine? Interactions are not expected. This list may not describe all possible interactions. Give your health care provider a list of all the medicines, herbs, non-prescription drugs, or dietary supplements you use. Also tell them if you smoke, drink alcohol, or use illegal drugs. Some items may interact with your medicine. What should I watch for while using this medicine? Your condition will be monitored  carefully while you are receiving this medicine. Visit your prescriber or health care professional for regular checks on your progress and for the needed blood tests. It is important to keep all appointments. What side effects may I notice from receiving this medicine? Side effects that you should report to your doctor or health care professional as soon as possible: -allergic reactions like skin rash, itching or hives, swelling of the face, lips, or tongue -shortness of breath, chest pain, swelling in a leg -unusual bleeding or bruising Side effects that usually do not require medical attention (report to your doctor or health care professional if they continue or are bothersome): -dizziness -headache -muscle aches -pain in arms and legs -stomach pain -trouble sleeping This list may not describe all possible side effects. Call your doctor for medical advice about side effects. You may report side effects to FDA at 1-800-FDA-1088. Where should I keep my medicine? This drug is given in a hospital or clinic and will not be stored at home. NOTE: This sheet is a summary. It may not cover all possible information. If you have questions about this medicine, talk to your doctor, pharmacist, or health care provider.    2016, Elsevier/Gold Standard. (2008-02-08 15:13:04)  

## 2016-06-21 ENCOUNTER — Ambulatory Visit (HOSPITAL_BASED_OUTPATIENT_CLINIC_OR_DEPARTMENT_OTHER): Payer: Medicare Other | Admitting: Oncology

## 2016-06-21 ENCOUNTER — Ambulatory Visit (HOSPITAL_BASED_OUTPATIENT_CLINIC_OR_DEPARTMENT_OTHER): Payer: Medicare Other

## 2016-06-21 ENCOUNTER — Other Ambulatory Visit (HOSPITAL_BASED_OUTPATIENT_CLINIC_OR_DEPARTMENT_OTHER): Payer: Medicare Other

## 2016-06-21 VITALS — BP 147/63 | HR 77 | Temp 97.5°F | Resp 18 | Ht 68.0 in | Wt 139.9 lb

## 2016-06-21 DIAGNOSIS — D693 Immune thrombocytopenic purpura: Secondary | ICD-10-CM

## 2016-06-21 DIAGNOSIS — C9200 Acute myeloblastic leukemia, not having achieved remission: Secondary | ICD-10-CM | POA: Diagnosis not present

## 2016-06-21 DIAGNOSIS — C9201 Acute myeloblastic leukemia, in remission: Secondary | ICD-10-CM

## 2016-06-21 DIAGNOSIS — D61818 Other pancytopenia: Secondary | ICD-10-CM

## 2016-06-21 DIAGNOSIS — D696 Thrombocytopenia, unspecified: Secondary | ICD-10-CM

## 2016-06-21 DIAGNOSIS — C9202 Acute myeloblastic leukemia, in relapse: Secondary | ICD-10-CM

## 2016-06-21 DIAGNOSIS — D469 Myelodysplastic syndrome, unspecified: Secondary | ICD-10-CM

## 2016-06-21 LAB — COMPREHENSIVE METABOLIC PANEL
ALBUMIN: 3.8 g/dL (ref 3.5–5.0)
ALK PHOS: 96 U/L (ref 40–150)
ALT: 15 U/L (ref 0–55)
AST: 18 U/L (ref 5–34)
Anion Gap: 10 mEq/L (ref 3–11)
BILIRUBIN TOTAL: 0.59 mg/dL (ref 0.20–1.20)
BUN: 14.1 mg/dL (ref 7.0–26.0)
CALCIUM: 9.3 mg/dL (ref 8.4–10.4)
CO2: 28 mEq/L (ref 22–29)
Chloride: 98 mEq/L (ref 98–109)
Creatinine: 0.8 mg/dL (ref 0.6–1.1)
EGFR: 66 mL/min/{1.73_m2} — AB (ref >90)
Glucose: 88 mg/dl (ref 70–140)
POTASSIUM: 3.7 meq/L (ref 3.5–5.1)
Sodium: 136 mEq/L (ref 136–145)
TOTAL PROTEIN: 6.8 g/dL (ref 6.4–8.3)

## 2016-06-21 LAB — CBC WITH DIFFERENTIAL/PLATELET
BASO%: 0 % (ref 0.0–2.0)
Basophils Absolute: 0 10*3/uL (ref 0.0–0.1)
EOS%: 1.1 % (ref 0.0–7.0)
Eosinophils Absolute: 0 10*3/uL (ref 0.0–0.5)
HEMATOCRIT: 29.3 % — AB (ref 34.8–46.6)
HGB: 10.2 g/dL — ABNORMAL LOW (ref 11.6–15.9)
LYMPH#: 1.1 10*3/uL (ref 0.9–3.3)
LYMPH%: 57.4 % — ABNORMAL HIGH (ref 14.0–49.7)
MCH: 35.1 pg — ABNORMAL HIGH (ref 25.1–34.0)
MCHC: 34.8 g/dL (ref 31.5–36.0)
MCV: 100.7 fL (ref 79.5–101.0)
MONO#: 0 10*3/uL — ABNORMAL LOW (ref 0.1–0.9)
MONO%: 1.1 % (ref 0.0–14.0)
NEUT#: 0.8 10*3/uL — ABNORMAL LOW (ref 1.5–6.5)
NEUT%: 40.4 % (ref 38.4–76.8)
Platelets: 37 10*3/uL — ABNORMAL LOW (ref 145–400)
RBC: 2.91 10*6/uL — ABNORMAL LOW (ref 3.70–5.45)
RDW: 19.6 % — AB (ref 11.2–14.5)
WBC: 1.9 10*3/uL — ABNORMAL LOW (ref 3.9–10.3)

## 2016-06-21 LAB — CHCC SMEAR

## 2016-06-21 LAB — TECHNOLOGIST REVIEW

## 2016-06-21 MED ORDER — ROMIPLOSTIM 250 MCG ~~LOC~~ SOLR
250.0000 ug | Freq: Once | SUBCUTANEOUS | Status: AC
  Administered 2016-06-21: 250 ug via SUBCUTANEOUS
  Filled 2016-06-21: qty 0.5

## 2016-06-21 NOTE — Patient Instructions (Signed)
Romiplostim injection What is this medicine? ROMIPLOSTIM (roe mi PLOE stim) helps your body make more platelets. This medicine is used to treat low platelets caused by chronic idiopathic thrombocytopenic purpura (ITP). This medicine may be used for other purposes; ask your health care provider or pharmacist if you have questions. What should I tell my health care provider before I take this medicine? They need to know if you have any of these conditions: -cancer or myelodysplastic syndrome -low blood counts, like low white cell, platelet, or red cell counts -take medicines that treat or prevent blood clots -an unusual or allergic reaction to romiplostim, mannitol, other medicines, foods, dyes, or preservatives -pregnant or trying to get pregnant -breast-feeding How should I use this medicine? This medicine is for injection under the skin. It is given by a health care professional in a hospital or clinic setting. A special MedGuide will be given to you before your injection. Read this information carefully each time. Talk to your pediatrician regarding the use of this medicine in children. Special care may be needed. Overdosage: If you think you have taken too much of this medicine contact a poison control center or emergency room at once. NOTE: This medicine is only for you. Do not share this medicine with others. What if I miss a dose? It is important not to miss your dose. Call your doctor or health care professional if you are unable to keep an appointment. What may interact with this medicine? Interactions are not expected. This list may not describe all possible interactions. Give your health care provider a list of all the medicines, herbs, non-prescription drugs, or dietary supplements you use. Also tell them if you smoke, drink alcohol, or use illegal drugs. Some items may interact with your medicine. What should I watch for while using this medicine? Your condition will be monitored  carefully while you are receiving this medicine. Visit your prescriber or health care professional for regular checks on your progress and for the needed blood tests. It is important to keep all appointments. What side effects may I notice from receiving this medicine? Side effects that you should report to your doctor or health care professional as soon as possible: -allergic reactions like skin rash, itching or hives, swelling of the face, lips, or tongue -shortness of breath, chest pain, swelling in a leg -unusual bleeding or bruising Side effects that usually do not require medical attention (report to your doctor or health care professional if they continue or are bothersome): -dizziness -headache -muscle aches -pain in arms and legs -stomach pain -trouble sleeping This list may not describe all possible side effects. Call your doctor for medical advice about side effects. You may report side effects to FDA at 1-800-FDA-1088. Where should I keep my medicine? This drug is given in a hospital or clinic and will not be stored at home. NOTE: This sheet is a summary. It may not cover all possible information. If you have questions about this medicine, talk to your doctor, pharmacist, or health care provider.    2016, Elsevier/Gold Standard. (2008-02-08 15:13:04)  

## 2016-06-21 NOTE — Telephone Encounter (Signed)
Oral Chemotherapy Pharmacist Encounter  Per [facility], patient should have received 1st ship,emt of Revlimid on 06/13/16.  Johny Drilling, PharmD, BCPS, BCOP 06/21/2016  12:13 PM Oral Oncology Clinic [phone]

## 2016-06-22 NOTE — Progress Notes (Signed)
[place]     ID: BRICELYN FREESTONE DOB: August 08, 1935  MR#: [id number]  CNO#:[id number]  Patient Care Team: [place], FNP as PCP - General (Family Medicine) PCP: [name], FNP, [name] GYN: SU:  OTHER MD: [name], [name], [name]  CHIEF COMPLAINT: Acute myeloid leukemia; ITP  CURRENT TREATMENT: lenalidomide, romiplostim  HISTORY OF PRESENT ILLNESS: From the 05/22/2025 consult note:  "The patient was evaluated at her PCP's office 05/22/2015 with a complaint of DOE worsening over several months. Labwork was obtained including a CBC, whioch showed WBC 1.8, platelets 14K, and Hb 5.9 with an MCV of 97.5. DDimer was 1.04. Accordingly the patient was referred to the ED and was admitted 05/23/2015.   Labwork since admission includes a repeat CBC with WBC 1.7, HB 5.4, MCV 100.6 and platelets 10K. Creat was 1.04 with GFR 50. Reticulocytes are not elevated with abs 39.3 (1.7%) and LDH is normal at 188. Other labs are pending.  The patient tells me because of peripheral neuropathy she has been receiving monthly B-12 shots since 2005."  Bone marrow biopsy 05/25/2015 showed acute myeloid leukemia, with 17% blasts by flow cytometry, 24% by aspirate counts, and 20-30% by immunohistochemistry ([id number], and [id number]). The blasts were positive for CD 117 and focally for MPO. There were also myelodysplasia related changes in all 3 cell lines.  The patient's subsequent history is as detailed below.  INTERVAL HISTORY: Lynsee returns today for follow-up of her acute leukemia/myelodysplasia. After her last visit here we started her on lenalidomide. She is obtaining this at $100 a month (I have contacted our oral chemotherapy pharmacist to see if we can arrange for any further assistance for her). She took her first dose 06/18/2016. So far she is having absolutely no side effects from it and in particular no problems with constipation, rash, nausea,  fatigue, or other issues.   REVIEW OF SYSTEMS: She greatly enjoyed Christmas which she spent with her son and his family. She complains of some shortness of breath only when she is climbing stairs. She has no cough, phlegm production or pleurisy. She has stress urinary incontinence symptoms which are stable. She has some hip pain which is the reason she uses a cane to walk, but this is unchanged. She feels forgetful and depressed. Her husband's birthday is coming up next week and this of course is a very recent loss. Aside from these issues a detailed review of systems today was stable   PAST MEDICAL HISTORY: Past Medical History:  Diagnosis Date  . Anemia   . Anxiety   . Arthritis    "hand joints; hips; back" (05/23/2015)  . Cancer ([place])    dec. 2016-leukemia  . Chronic lower back pain   . Depression   . GERD (gastroesophageal reflux disease)   . History of blood transfusion 05/23/2015   "Hgb 5.9"  . Hypertension   . Shortness of breath dyspnea    with exertion    PAST SURGICAL HISTORY: Past Surgical History:  Procedure Laterality Date  . Bone spur     R thigh  . CATARACT EXTRACTION W/ INTRAOCULAR LENS  IMPLANT, BILATERAL  ~ 2005  . DILATION AND CURETTAGE OF UTERUS    . ESOPHAGOGASTRODUODENOSCOPY (EGD) WITH PROPOFOL N/A 11/08/2014   Procedure: ESOPHAGOGASTRODUODENOSCOPY (EGD) WITH PROPOFOL;  Surgeon: Garlan Fair, MD;  Location: [facility];  Service: Endoscopy;  Laterality: N/A;  . JOINT REPLACEMENT    . TONSILLECTOMY  ~ 1950  .  TOTAL KNEE ARTHROPLASTY  04/27/2012   Procedure: TOTAL KNEE ARTHROPLASTY;  Surgeon: Mauri Pole, MD;  Location: [facility];  Service: Orthopedics;  Laterality: Right;  . TOTAL SHOULDER ARTHROPLASTY  09/13/2011   Procedure: TOTAL SHOULDER ARTHROPLASTY;  Surgeon: Augustin Schooling, MD;  Location: [facility];  Service: Orthopedics;  Laterality: Left;  LEFT SHOULDER REVERSED TOTAL SHOULDER ARTHROPLASTY    FAMILY HISTORY Family History  Problem Relation Age  of Onset  . Heart attack Mother 15    Died age 37  . Heart disease Brother     Atrial fib  Patient's father died age 31 with prostate cancer; mother died atv11 with an MI. One brother, alive at 44; one sister with parkinson's. No blood or cancer problems otherwise in family  GYNECOLOGIC HISTORY:  No LMP recorded. Patient is postmenopausal. Menarche age 89, first live birth age 51, she is GXP3; menopause age 17, no HR  SOCIAL HISTORY:  Grade school Pharmacist, hospital, retired; husband was a Higher education careers adviser. He has severe heart disease and she is his main caretaker. Son [name] works for [employer] and sings in the [employer]; daughter Hornell lives in [place], a homemaker; daughter [name] works at the surgical center in [employer] as an Therapist, sports. The patient has 7 gch. She is a Psychologist, forensic    ADVANCED DIRECTIVES: in place; the patient's son [name] is her [place]: Social History  Substance Use Topics  . Smoking status: Never Smoker  . Smokeless tobacco: Never Used  . Alcohol use No     Colonoscopy:  PAP:  Bone density:  Lipid panel:  Allergies  Allergen Reactions  . Cozaar [Losartan] Other (See Comments)    Cause pt to lose her taste for foods  . Codeine Itching and Other (See Comments)    Makes feel crazy  PT STATES SHE ALSO CAN NOT TAKE THE SYNTHETIC CODEINES  . Hydrocodone Itching    Tolerates with benadryl  . Oxycodone Itching  . Tetanus Toxoids Swelling and Rash    Current Outpatient Prescriptions  Medication Sig Dispense Refill  . amLODipine (NORVASC) 2.5 MG tablet TAKE ONE TABLET BY MOUTH ONCE DAILY 90 tablet 5  . Cyanocobalamin 1000 MCG/ML KIT Inject 1,000 mcg as directed every 30 (thirty) days. B 12 shot 10 kit 1  . hydrochlorothiazide (HYDRODIURIL) 25 MG tablet Take 1 tablet (25 mg total) by mouth daily. Yearly physical due in Nov must see MD for refills 90 tablet 0  . lenalidomide (REVLIMID) 10 MG capsule Take 1 capsule (10 mg total) by mouth daily. 21 capsule 0  .  Melatonin 5 MG TABS Take 2.5 mg by mouth at bedtime.    . Multiple Vitamin (MULTIVITAMIN WITH MINERALS) TABS tablet Take 1 tablet by mouth daily.    . pantoprazole (PROTONIX) 40 MG tablet Take 1 tablet (40 mg total) by mouth every morning. 90 tablet 4  . valACYclovir (VALTREX) 1000 MG tablet Take 1,000 mg by mouth 2 (two) times daily.     . Venlafaxine HCl 150 MG TB24 TAKE ONE TABLET BY MOUTH ONCE DAILY 30 tablet 5  . Vitamin D, Ergocalciferol, (DRISDOL) 50000 units CAPS capsule Take 1 capsule (50,000 Units total) by mouth See admin instructions. Every other week. 30 capsule 1   No current facility-administered medications for this visit.    Facility-Administered Medications Ordered in Other Visits  Medication Dose Route Frequency Provider Last Rate Last Dose  . romiPLOStim (NPLATE) injection 60 mcg  60 mcg Subcutaneous Once Virgie Dad  Sharna Gabrys, MD        OBJECTIVE: Older white woman Who appears stated age  Vitals:   06/21/16 1428  BP: (!) 147/63  Pulse: 77  Resp: 18  Temp: 97.5 F (36.4 C)     Body mass index is 21.27 kg/m.    ECOG FS:1 - Symptomatic but completely ambulatory  Sclerae unicteric, EOMs intact Oropharynx clear and moist No cervical or supraclavicular adenopathy Lungs no rales or rhonchi Heart regular rate and rhythm Abd soft, nontender, positive bowel sounds MSK no focal spinal tenderness, no upper extremity lymphedema Neuro: nonfocal, well oriented, appropriate affect Breasts: Deferred  LAB RESULTS:  CMP     Component Value Date/Time   NA 136 06/21/2016 1407   K 3.7 06/21/2016 1407   CL 103 02/29/2016 0820   CO2 28 06/21/2016 1407   GLUCOSE 88 06/21/2016 1407   BUN 14.1 06/21/2016 1407   CREATININE 0.8 06/21/2016 1407   CALCIUM 9.3 06/21/2016 1407   PROT 6.8 06/21/2016 1407   ALBUMIN 3.8 06/21/2016 1407   AST 18 06/21/2016 1407   ALT 15 06/21/2016 1407   ALKPHOS 96 06/21/2016 1407   BILITOT 0.59 06/21/2016 1407   GFRNONAA >60 02/29/2016 0820    GFRAA >60 02/29/2016 0820    INo results found for: SPEP, UPEP  Lab Results  Component Value Date   WBC 1.9 (L) 06/21/2016   NEUTROABS 0.8 (L) 06/21/2016   HGB 10.2 (L) 06/21/2016   HCT 29.3 (L) 06/21/2016   MCV 100.7 06/21/2016   PLT 37 (L) 06/21/2016      Chemistry      Component Value Date/Time   NA 136 06/21/2016 1407   K 3.7 06/21/2016 1407   CL 103 02/29/2016 0820   CO2 28 06/21/2016 1407   BUN 14.1 06/21/2016 1407   CREATININE 0.8 06/21/2016 1407      Component Value Date/Time   CALCIUM 9.3 06/21/2016 1407   ALKPHOS 96 06/21/2016 1407   AST 18 06/21/2016 1407   ALT 15 06/21/2016 1407   BILITOT 0.59 06/21/2016 1407       No results found for: LABCA2  No components found for: LABCA125  No results for input(s): INR in the last 168 hours.  Urinalysis    Component Value Date/Time   COLORURINE YELLOW 04/21/2012 0946   APPEARANCEUR CLOUDY (A) 04/21/2012 0946   LABSPEC 1.019 04/21/2012 0946   PHURINE 7.0 04/21/2012 0946   GLUCOSEU NEGATIVE 04/21/2012 0946   HGBUR NEGATIVE 04/21/2012 0946   BILIRUBINUR NEGATIVE 04/21/2012 0946   KETONESUR NEGATIVE 04/21/2012 0946   PROTEINUR NEGATIVE 04/21/2012 0946   UROBILINOGEN 1.0 04/21/2012 0946   NITRITE NEGATIVE 04/21/2012 0946   LEUKOCYTESUR NEGATIVE 04/21/2012 0946    STUDIES:AND MICROSCOPIC INFORMATION  Ct Biopsy  Result Date: 06/03/2016 INDICATION: AML, THROMBOCYTOPENIA EXAM: CT GUIDED LEFT ILIAC BONE MARROW ASPIRATION AND CORE BIOPSY Date:  12/11/201712/04/2016 9:47 am Radiologist:  M. Ruel Favors, MD Guidance:  CT FLUOROSCOPY TIME:  Fluoroscopy Time: NONE. MEDICATIONS: 1% LIDOCAINE LOCALLY ANESTHESIA/SEDATION: 2.0 mg IV Versed; 100 mcg IV Fentanyl Moderate Sedation Time:  13 MINUTES The patient was continuously monitored during the procedure by the interventional radiology nurse under my direct supervision. CONTRAST:  None. COMPLICATIONS: None PROCEDURE: Informed consent was obtained from the patient  following explanation of the procedure, risks, benefits and alternatives. The patient understands, agrees and consents for the procedure. All questions were addressed. A time out was performed. The patient was positioned prone and non-contrast localization CT was performed of the  pelvis to demonstrate the iliac marrow spaces. Maximal barrier sterile technique utilized including caps, mask, sterile gowns, sterile gloves, large sterile drape, hand hygiene, and Betadine prep. Under sterile conditions and local anesthesia, an 11 gauge coaxial bone biopsy needle was advanced into the left iliac marrow space. Needle position was confirmed with CT imaging. Initially, bone marrow aspiration was performed. Next, the 11 gauge outer cannula was utilized to obtain a left iliac bone marrow core biopsy. Needle was removed. Hemostasis was obtained with compression. The patient tolerated the procedure well. Samples were prepared with the cytotechnologist. No immediate complications. IMPRESSION: CT guided left iliac bone marrow aspiration and core biopsy. Electronically Signed   By: Jerilynn Mages.  Shick M.D.   On: 06/03/2016 09:55   .  ASSESSMENT: 80 y.o. [place] woman with acute myeloid leukemia diagnosed by bone marrow biopsy 05/25/2015, with the blast count between 17 and 30% depending on the method of determination.   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, with gain of 2 and 19  (b) considered allogenic transplant--not felt to be a candidate  (c) all transfusion products to be irradiated  (1) started sQ azacitidine 06/05/2015, receiving 7 doses every 28 day cycle  (a) repeat bone marrow biopsy 02/29/2016 showed a mildly hypercellular marrow with dyspoiesis, but no increase in blasts either histologically or by flow cytometry  (b) repeat bone marrow biopsy 06/03/2016 shows recurrent acute myeloid leukemia  (2) immune thrombocytopenic purpura  (a) s/p IVIG 07/06/2015 (1g/kg x 2d) with excellent  response  (b) romiplostim first dose 07/07/2015, repeated weekly as of 08/04/2015  (3) biopsy of an enlarged right axillary lymph node at [facility] 11/13/2015 was nondiagnostic.  (4) started lenalidomide at 10 mg daily beginning 06/18/2016  PLAN:  Malaney is tolerating the lenalidomide well so far but of course she is only had a few days treatment. We discussed that she will take this for 21 days and then be off 7 days area  She is very concerned regarding the possibility of clots. Normally I would put her on aspirin with this medication, but her platelets will not allow it for now. We are increasing the romiplostim dose and if we can get his stable more normal platelet count we will consider the aspirin but for now I have just asked her to keep [place] posted if she develops asymmetric lower extremity [place] or any change in her breathing  She understands that the lenalidomide can actually cause her counts drop. We are already checking her labs on a weekly basis and we will continue that. She is going to see me again at the start of his second cycle, but of course she knows to call for any problems that may develop before that visit.    [name], MD   06/22/2016 9:10 AM

## 2016-06-25 ENCOUNTER — Inpatient Hospital Stay: Payer: Medicare Other

## 2016-06-26 ENCOUNTER — Inpatient Hospital Stay: Payer: Medicare Other

## 2016-06-27 ENCOUNTER — Inpatient Hospital Stay: Payer: Medicare Other

## 2016-06-28 ENCOUNTER — Other Ambulatory Visit (HOSPITAL_BASED_OUTPATIENT_CLINIC_OR_DEPARTMENT_OTHER): Payer: Medicare Other

## 2016-06-28 ENCOUNTER — Ambulatory Visit (HOSPITAL_BASED_OUTPATIENT_CLINIC_OR_DEPARTMENT_OTHER): Payer: Medicare Other

## 2016-06-28 ENCOUNTER — Inpatient Hospital Stay: Payer: Medicare Other

## 2016-06-28 VITALS — BP 152/69 | HR 92 | Temp 97.8°F | Resp 20

## 2016-06-28 DIAGNOSIS — D693 Immune thrombocytopenic purpura: Secondary | ICD-10-CM

## 2016-06-28 DIAGNOSIS — D61818 Other pancytopenia: Secondary | ICD-10-CM

## 2016-06-28 DIAGNOSIS — C9200 Acute myeloblastic leukemia, not having achieved remission: Secondary | ICD-10-CM | POA: Diagnosis not present

## 2016-06-28 DIAGNOSIS — C9201 Acute myeloblastic leukemia, in remission: Secondary | ICD-10-CM

## 2016-06-28 DIAGNOSIS — D696 Thrombocytopenia, unspecified: Secondary | ICD-10-CM

## 2016-06-28 LAB — CHCC SMEAR

## 2016-06-28 LAB — CBC WITH DIFFERENTIAL/PLATELET
BASO%: 0.5 % (ref 0.0–2.0)
BASOS ABS: 0 10*3/uL (ref 0.0–0.1)
EOS ABS: 0 10*3/uL (ref 0.0–0.5)
EOS%: 1.4 % (ref 0.0–7.0)
HCT: 28.7 % — ABNORMAL LOW (ref 34.8–46.6)
HGB: 9.9 g/dL — ABNORMAL LOW (ref 11.6–15.9)
LYMPH%: 57.7 % — AB (ref 14.0–49.7)
MCH: 35 pg — AB (ref 25.1–34.0)
MCHC: 34.5 g/dL (ref 31.5–36.0)
MCV: 101.4 fL — ABNORMAL HIGH (ref 79.5–101.0)
MONO#: 0.2 10*3/uL (ref 0.1–0.9)
MONO%: 8 % (ref 0.0–14.0)
NEUT#: 0.7 10*3/uL — ABNORMAL LOW (ref 1.5–6.5)
NEUT%: 32.4 % — AB (ref 38.4–76.8)
NRBC: 0 % (ref 0–0)
RBC: 2.83 10*6/uL — AB (ref 3.70–5.45)
RDW: 19.5 % — AB (ref 11.2–14.5)
WBC: 2.1 10*3/uL — ABNORMAL LOW (ref 3.9–10.3)
lymph#: 1.2 10*3/uL (ref 0.9–3.3)

## 2016-06-28 MED ORDER — ROMIPLOSTIM INJECTION 500 MCG
5.0000 ug/kg | Freq: Once | SUBCUTANEOUS | Status: AC
  Administered 2016-06-28: 320 ug via SUBCUTANEOUS
  Filled 2016-06-28: qty 0.64

## 2016-06-28 NOTE — Patient Instructions (Signed)
Romiplostim injection What is this medicine? ROMIPLOSTIM (roe mi PLOE stim) helps your body make more platelets. This medicine is used to treat low platelets caused by chronic idiopathic thrombocytopenic purpura (ITP). This medicine may be used for other purposes; ask your health care provider or pharmacist if you have questions. What should I tell my health care provider before I take this medicine? They need to know if you have any of these conditions: -cancer or myelodysplastic syndrome -low blood counts, like low white cell, platelet, or red cell counts -take medicines that treat or prevent blood clots -an unusual or allergic reaction to romiplostim, mannitol, other medicines, foods, dyes, or preservatives -pregnant or trying to get pregnant -breast-feeding How should I use this medicine? This medicine is for injection under the skin. It is given by a health care professional in a hospital or clinic setting. A special MedGuide will be given to you before your injection. Read this information carefully each time. Talk to your pediatrician regarding the use of this medicine in children. Special care may be needed. Overdosage: If you think you have taken too much of this medicine contact a poison control center or emergency room at once. NOTE: This medicine is only for you. Do not share this medicine with others. What if I miss a dose? It is important not to miss your dose. Call your doctor or health care professional if you are unable to keep an appointment. What may interact with this medicine? Interactions are not expected. This list may not describe all possible interactions. Give your health care provider a list of all the medicines, herbs, non-prescription drugs, or dietary supplements you use. Also tell them if you smoke, drink alcohol, or use illegal drugs. Some items may interact with your medicine. What should I watch for while using this medicine? Your condition will be monitored  carefully while you are receiving this medicine. Visit your prescriber or health care professional for regular checks on your progress and for the needed blood tests. It is important to keep all appointments. What side effects may I notice from receiving this medicine? Side effects that you should report to your doctor or health care professional as soon as possible: -allergic reactions like skin rash, itching or hives, swelling of the face, lips, or tongue -shortness of breath, chest pain, swelling in a leg -unusual bleeding or bruising Side effects that usually do not require medical attention (report to your doctor or health care professional if they continue or are bothersome): -dizziness -headache -muscle aches -pain in arms and legs -stomach pain -trouble sleeping This list may not describe all possible side effects. Call your doctor for medical advice about side effects. You may report side effects to FDA at 1-800-FDA-1088. Where should I keep my medicine? This drug is given in a hospital or clinic and will not be stored at home. NOTE: This sheet is a summary. It may not cover all possible information. If you have questions about this medicine, talk to your doctor, pharmacist, or health care provider.    2016, Elsevier/Gold Standard. (2008-02-08 15:13:04)  

## 2016-06-28 NOTE — Progress Notes (Signed)
Low Platelets information reviewed with patient. Instructed to contact office at once if bleeding occurs. Verbalized understanding of directions

## 2016-07-01 ENCOUNTER — Inpatient Hospital Stay: Payer: Medicare Other

## 2016-07-02 ENCOUNTER — Inpatient Hospital Stay: Payer: Medicare Other

## 2016-07-05 ENCOUNTER — Ambulatory Visit (HOSPITAL_BASED_OUTPATIENT_CLINIC_OR_DEPARTMENT_OTHER): Payer: Medicare Other

## 2016-07-05 ENCOUNTER — Other Ambulatory Visit: Payer: Self-pay | Admitting: *Deleted

## 2016-07-05 ENCOUNTER — Other Ambulatory Visit (HOSPITAL_BASED_OUTPATIENT_CLINIC_OR_DEPARTMENT_OTHER): Payer: Medicare Other

## 2016-07-05 ENCOUNTER — Telehealth: Payer: Self-pay | Admitting: *Deleted

## 2016-07-05 ENCOUNTER — Ambulatory Visit (HOSPITAL_COMMUNITY)
Admission: RE | Admit: 2016-07-05 | Discharge: 2016-07-05 | Disposition: A | Discharge status: Home / Self Care | Payer: Medicare Other | Source: Ambulatory Visit | Attending: Oncology | Admitting: Oncology

## 2016-07-05 VITALS — BP 135/56 | HR 81 | Temp 97.8°F | Resp 18

## 2016-07-05 DIAGNOSIS — C9200 Acute myeloblastic leukemia, not having achieved remission: Secondary | ICD-10-CM

## 2016-07-05 DIAGNOSIS — D649 Anemia, unspecified: Secondary | ICD-10-CM

## 2016-07-05 DIAGNOSIS — C9201 Acute myeloblastic leukemia, in remission: Secondary | ICD-10-CM

## 2016-07-05 DIAGNOSIS — D6489 Other specified anemias: Secondary | ICD-10-CM | POA: Diagnosis present

## 2016-07-05 DIAGNOSIS — D693 Immune thrombocytopenic purpura: Secondary | ICD-10-CM | POA: Diagnosis not present

## 2016-07-05 DIAGNOSIS — D696 Thrombocytopenia, unspecified: Secondary | ICD-10-CM

## 2016-07-05 DIAGNOSIS — D61818 Other pancytopenia: Secondary | ICD-10-CM

## 2016-07-05 LAB — CBC WITH DIFFERENTIAL/PLATELET
BASO%: 0.7 % (ref 0.0–2.0)
BASOS ABS: 0 10*3/uL (ref 0.0–0.1)
EOS ABS: 0.1 10*3/uL (ref 0.0–0.5)
EOS%: 4.3 % (ref 0.0–7.0)
HEMATOCRIT: 27.7 % — AB (ref 34.8–46.6)
HEMOGLOBIN: 9.4 g/dL — AB (ref 11.6–15.9)
LYMPH#: 1 10*3/uL (ref 0.9–3.3)
LYMPH%: 70.3 % — ABNORMAL HIGH (ref 14.0–49.7)
MCH: 35.2 pg — ABNORMAL HIGH (ref 25.1–34.0)
MCHC: 33.9 g/dL (ref 31.5–36.0)
MCV: 103.7 fL — ABNORMAL HIGH (ref 79.5–101.0)
MONO#: 0.1 10*3/uL (ref 0.1–0.9)
MONO%: 10.1 % (ref 0.0–14.0)
NEUT%: 14.6 % — ABNORMAL LOW (ref 38.4–76.8)
NEUTROS ABS: 0.2 10*3/uL — AB (ref 1.5–6.5)
NRBC: 0 % (ref 0–0)
PLATELETS: 16 10*3/uL — AB (ref 145–400)
RBC: 2.67 10*6/uL — ABNORMAL LOW (ref 3.70–5.45)
RDW: 19.9 % — AB (ref 11.2–14.5)
WBC: 1.4 10*3/uL — AB (ref 3.9–10.3)

## 2016-07-05 LAB — PREPARE RBC (CROSSMATCH)

## 2016-07-05 LAB — CHCC SMEAR

## 2016-07-05 LAB — TECHNOLOGIST REVIEW: Technologist Review: 2

## 2016-07-05 MED ORDER — ROMIPLOSTIM INJECTION 500 MCG
6.0000 ug/kg | Freq: Once | SUBCUTANEOUS | Status: AC
  Administered 2016-07-05: 380 ug via SUBCUTANEOUS
  Filled 2016-07-05: qty 0.76

## 2016-07-05 NOTE — Patient Instructions (Signed)
Romiplostim injection What is this medicine? ROMIPLOSTIM (roe mi PLOE stim) helps your body make more platelets. This medicine is used to treat low platelets caused by chronic idiopathic thrombocytopenic purpura (ITP). This medicine may be used for other purposes; ask your health care provider or pharmacist if you have questions. What should I tell my health care provider before I take this medicine? They need to know if you have any of these conditions: -cancer or myelodysplastic syndrome -low blood counts, like low white cell, platelet, or red cell counts -take medicines that treat or prevent blood clots -an unusual or allergic reaction to romiplostim, mannitol, other medicines, foods, dyes, or preservatives -pregnant or trying to get pregnant -breast-feeding How should I use this medicine? This medicine is for injection under the skin. It is given by a health care professional in a hospital or clinic setting. A special MedGuide will be given to you before your injection. Read this information carefully each time. Talk to your pediatrician regarding the use of this medicine in children. Special care may be needed. Overdosage: If you think you have taken too much of this medicine contact a poison control center or emergency room at once. NOTE: This medicine is only for you. Do not share this medicine with others. What if I miss a dose? It is important not to miss your dose. Call your doctor or health care professional if you are unable to keep an appointment. What may interact with this medicine? Interactions are not expected. This list may not describe all possible interactions. Give your health care provider a list of all the medicines, herbs, non-prescription drugs, or dietary supplements you use. Also tell them if you smoke, drink alcohol, or use illegal drugs. Some items may interact with your medicine. What should I watch for while using this medicine? Your condition will be monitored  carefully while you are receiving this medicine. Visit your prescriber or health care professional for regular checks on your progress and for the needed blood tests. It is important to keep all appointments. What side effects may I notice from receiving this medicine? Side effects that you should report to your doctor or health care professional as soon as possible: -allergic reactions like skin rash, itching or hives, swelling of the face, lips, or tongue -shortness of breath, chest pain, swelling in a leg -unusual bleeding or bruising Side effects that usually do not require medical attention (report to your doctor or health care professional if they continue or are bothersome): -dizziness -headache -muscle aches -pain in arms and legs -stomach pain -trouble sleeping This list may not describe all possible side effects. Call your doctor for medical advice about side effects. You may report side effects to FDA at 1-800-FDA-1088. Where should I keep my medicine? This drug is given in a hospital or clinic and will not be stored at home. NOTE: This sheet is a summary. It may not cover all possible information. If you have questions about this medicine, talk to your doctor, pharmacist, or health care provider.    2016, Elsevier/Gold Standard. (2008-02-08 15:13:04)  

## 2016-07-08 ENCOUNTER — Ambulatory Visit (HOSPITAL_BASED_OUTPATIENT_CLINIC_OR_DEPARTMENT_OTHER): Payer: Medicare Other

## 2016-07-08 ENCOUNTER — Other Ambulatory Visit: Payer: Self-pay | Admitting: *Deleted

## 2016-07-08 DIAGNOSIS — D6489 Other specified anemias: Secondary | ICD-10-CM | POA: Diagnosis not present

## 2016-07-08 DIAGNOSIS — D649 Anemia, unspecified: Secondary | ICD-10-CM

## 2016-07-08 MED ORDER — DIPHENHYDRAMINE HCL 25 MG PO CAPS
ORAL_CAPSULE | ORAL | Status: AC
  Filled 2016-07-08: qty 1

## 2016-07-08 MED ORDER — ACETAMINOPHEN 325 MG PO TABS
650.0000 mg | ORAL_TABLET | Freq: Once | ORAL | Status: AC
  Administered 2016-07-08: 650 mg via ORAL

## 2016-07-08 MED ORDER — DIPHENHYDRAMINE HCL 25 MG PO CAPS
25.0000 mg | ORAL_CAPSULE | Freq: Once | ORAL | Status: AC
  Administered 2016-07-08: 25 mg via ORAL

## 2016-07-08 MED ORDER — SODIUM CHLORIDE 0.9 % IV SOLN
250.0000 mL | Freq: Once | INTRAVENOUS | Status: AC
  Administered 2016-07-08: 250 mL via INTRAVENOUS

## 2016-07-08 MED ORDER — ACETAMINOPHEN 325 MG PO TABS
ORAL_TABLET | ORAL | Status: AC
  Filled 2016-07-08: qty 2

## 2016-07-08 NOTE — Patient Instructions (Signed)
Blood Transfusion, Adult, Care After This sheet gives you information about how to care for yourself after your procedure. Your health care provider may also give you more specific instructions. If you have problems or questions, contact your health care provider. What can I expect after the procedure? After your procedure, it is common to have:  Bruising and soreness where the IV tube was inserted.  Headache. Follow these instructions at home:  Take over-the-counter and prescription medicines only as told by your health care provider.  Return to your normal activities as told by your health care provider.  Follow instructions from your health care provider about how to take care of your IV insertion site. Make sure you:  Wash your hands with soap and water before you change your bandage (dressing). If soap and water are not available, use hand sanitizer.  Change your dressing as told by your health care provider.  Check your IV insertion site every day for signs of infection. Check for:  More redness, swelling, or pain.  More fluid or blood.  Warmth.  Pus or a bad smell. Contact a health care provider if:  You have more redness, swelling, or pain around the IV insertion site.  You have more fluid or blood coming from the IV insertion site.  Your IV insertion site feels warm to the touch.  You have pus or a bad smell coming from the IV insertion site.  Your urine turns pink, red, or brown.  You feel weak after doing your normal activities. Get help right away if:  You have signs of a serious allergic or immune system reaction, including:  Itchiness.  Hives.  Trouble breathing.  Anxiety.  Chest or lower back pain.  Fever, flushing, and chills.  Rapid pulse.  Rash.  Diarrhea.  Vomiting.  Dark urine.  Serious headache.  Dizziness.  Stiff neck.  Yellow coloration of the face or the white parts of the eyes (jaundice). This information is not  intended to replace advice given to you by your health care provider. Make sure you discuss any questions you have with your health care provider. Document Released: 07/01/2014 Document Revised: 02/07/2016 Document Reviewed: 12/25/2015 Elsevier Interactive Patient Education  2017 [employer]vier [employer].  

## 2016-07-08 NOTE — Progress Notes (Signed)
Pt and VS stable at discharge. 

## 2016-07-09 LAB — TYPE AND SCREEN
ABO/RH(D): O POS
ANTIBODY SCREEN: NEGATIVE
UNIT DIVISION: 0

## 2016-07-11 ENCOUNTER — Telehealth: Payer: Self-pay | Admitting: *Deleted

## 2016-07-11 ENCOUNTER — Telehealth: Payer: Self-pay | Admitting: Oncology

## 2016-07-11 ENCOUNTER — Other Ambulatory Visit: Payer: Self-pay | Admitting: *Deleted

## 2016-07-11 ENCOUNTER — Encounter: Payer: Self-pay | Admitting: *Deleted

## 2016-07-11 MED ORDER — LENALIDOMIDE 10 MG PO CAPS: 10.0000 mg | ORAL_CAPSULE | Freq: Every day | ORAL | 0 refills | Status: DC

## 2016-07-11 NOTE — Telephone Encounter (Signed)
This RN contacted pt and discussed plan to reschedule to Monday.  Overall pt feels well " and much better since getting the unit of blood "

## 2016-07-11 NOTE — Telephone Encounter (Signed)
Patient has lab dr and injection appointment tomorrow 1/19 and cannot make due to inclement weather.  She was not sure what to do about reschedule because she come every Friday

## 2016-07-11 NOTE — Telephone Encounter (Signed)
This RN obtained signed Revlimid prescription and obtained Celegene authorization number,  Prescription refaxed per above.

## 2016-07-12 ENCOUNTER — Ambulatory Visit: Payer: Medicare Other | Admitting: Oncology

## 2016-07-12 ENCOUNTER — Other Ambulatory Visit: Payer: Medicare Other

## 2016-07-12 ENCOUNTER — Ambulatory Visit: Payer: Medicare Other

## 2016-07-12 ENCOUNTER — Telehealth: Payer: Self-pay | Admitting: Oncology

## 2016-07-12 NOTE — Telephone Encounter (Signed)
sw pt to confirm 1/22 appt at 1030 per LOS

## 2016-07-15 ENCOUNTER — Ambulatory Visit (HOSPITAL_BASED_OUTPATIENT_CLINIC_OR_DEPARTMENT_OTHER): Payer: Medicare Other

## 2016-07-15 ENCOUNTER — Ambulatory Visit (HOSPITAL_BASED_OUTPATIENT_CLINIC_OR_DEPARTMENT_OTHER): Payer: Medicare Other | Admitting: Adult Health

## 2016-07-15 ENCOUNTER — Other Ambulatory Visit (HOSPITAL_BASED_OUTPATIENT_CLINIC_OR_DEPARTMENT_OTHER): Payer: Medicare Other

## 2016-07-15 VITALS — BP 147/61 | HR 80 | Temp 97.5°F | Resp 17 | Ht 68.0 in | Wt 138.2 lb

## 2016-07-15 DIAGNOSIS — D469 Myelodysplastic syndrome, unspecified: Secondary | ICD-10-CM

## 2016-07-15 DIAGNOSIS — C9200 Acute myeloblastic leukemia, not having achieved remission: Secondary | ICD-10-CM

## 2016-07-15 DIAGNOSIS — D61818 Other pancytopenia: Secondary | ICD-10-CM

## 2016-07-15 DIAGNOSIS — C9202 Acute myeloblastic leukemia, in relapse: Secondary | ICD-10-CM

## 2016-07-15 DIAGNOSIS — D696 Thrombocytopenia, unspecified: Secondary | ICD-10-CM

## 2016-07-15 DIAGNOSIS — D693 Immune thrombocytopenic purpura: Secondary | ICD-10-CM

## 2016-07-15 DIAGNOSIS — D709 Neutropenia, unspecified: Secondary | ICD-10-CM

## 2016-07-15 DIAGNOSIS — C9201 Acute myeloblastic leukemia, in remission: Secondary | ICD-10-CM

## 2016-07-15 LAB — CBC WITH DIFFERENTIAL/PLATELET
BASO%: 2.2 % — ABNORMAL HIGH (ref 0.0–2.0)
Basophils Absolute: 0.1 10*3/uL (ref 0.0–0.1)
EOS ABS: 0 10*3/uL (ref 0.0–0.5)
EOS%: 0.9 % (ref 0.0–7.0)
HEMATOCRIT: 31.5 % — AB (ref 34.8–46.6)
HEMOGLOBIN: 10.9 g/dL — AB (ref 11.6–15.9)
LYMPH#: 1.5 10*3/uL (ref 0.9–3.3)
LYMPH%: 66.4 % — ABNORMAL HIGH (ref 14.0–49.7)
MCH: 35.1 pg — AB (ref 25.1–34.0)
MCHC: 34.6 g/dL (ref 31.5–36.0)
MCV: 101.6 fL — AB (ref 79.5–101.0)
MONO#: 0.5 10*3/uL (ref 0.1–0.9)
MONO%: 22.8 % — ABNORMAL HIGH (ref 0.0–14.0)
NEUT#: 0.2 10*3/uL — CL (ref 1.5–6.5)
NEUT%: 7.7 % — AB (ref 38.4–76.8)
PLATELETS: 16 10*3/uL — AB (ref 145–400)
RBC: 3.1 10*6/uL — ABNORMAL LOW (ref 3.70–5.45)
RDW: 22.1 % — AB (ref 11.2–14.5)
WBC: 2.3 10*3/uL — ABNORMAL LOW (ref 3.9–10.3)

## 2016-07-15 LAB — CHCC SMEAR

## 2016-07-15 LAB — TECHNOLOGIST REVIEW: Technologist Review: 5

## 2016-07-15 MED ORDER — ROMIPLOSTIM INJECTION 500 MCG
7.0000 ug/kg | Freq: Once | SUBCUTANEOUS | Status: AC
  Administered 2016-07-15: 440 ug via SUBCUTANEOUS
  Filled 2016-07-15: qty 0.88

## 2016-07-15 NOTE — Patient Instructions (Signed)
Romiplostim injection What is this medicine? ROMIPLOSTIM (roe mi PLOE stim) helps your body make more platelets. This medicine is used to treat low platelets caused by chronic idiopathic thrombocytopenic purpura (ITP). This medicine may be used for other purposes; ask your health care provider or pharmacist if you have questions. What should I tell my health care provider before I take this medicine? They need to know if you have any of these conditions: -cancer or myelodysplastic syndrome -low blood counts, like low white cell, platelet, or red cell counts -take medicines that treat or prevent blood clots -an unusual or allergic reaction to romiplostim, mannitol, other medicines, foods, dyes, or preservatives -pregnant or trying to get pregnant -breast-feeding How should I use this medicine? This medicine is for injection under the skin. It is given by a health care professional in a hospital or clinic setting. A special MedGuide will be given to you before your injection. Read this information carefully each time. Talk to your pediatrician regarding the use of this medicine in children. Special care may be needed. Overdosage: If you think you have taken too much of this medicine contact a poison control center or emergency room at once. NOTE: This medicine is only for you. Do not share this medicine with others. What if I miss a dose? It is important not to miss your dose. Call your doctor or health care professional if you are unable to keep an appointment. What may interact with this medicine? Interactions are not expected. This list may not describe all possible interactions. Give your health care provider a list of all the medicines, herbs, non-prescription drugs, or dietary supplements you use. Also tell them if you smoke, drink alcohol, or use illegal drugs. Some items may interact with your medicine. What should I watch for while using this medicine? Your condition will be monitored  carefully while you are receiving this medicine. Visit your prescriber or health care professional for regular checks on your progress and for the needed blood tests. It is important to keep all appointments. What side effects may I notice from receiving this medicine? Side effects that you should report to your doctor or health care professional as soon as possible: -allergic reactions like skin rash, itching or hives, swelling of the face, lips, or tongue -shortness of breath, chest pain, swelling in a leg -unusual bleeding or bruising Side effects that usually do not require medical attention (report to your doctor or health care professional if they continue or are bothersome): -dizziness -headache -muscle aches -pain in arms and legs -stomach pain -trouble sleeping This list may not describe all possible side effects. Call your doctor for medical advice about side effects. You may report side effects to FDA at 1-800-FDA-1088. Where should I keep my medicine? This drug is given in a hospital or clinic and will not be stored at home. NOTE: This sheet is a summary. It may not cover all possible information. If you have questions about this medicine, talk to your doctor, pharmacist, or health care provider.    2016, Elsevier/Gold Standard. (2008-02-08 15:13:04)  

## 2016-07-15 NOTE — Progress Notes (Addendum)
[place]     ID: [name] DOB: [birth year]  MR#: [id number]  SNK#:[id number]  Patient Care Team: Golden Circle, FNP as PCP - General (Family Medicine) PCP: [name], FNP, [name] GYN: SU:  OTHER MD: [name], [name], [name]  CHIEF COMPLAINT: Acute myeloid leukemia; ITP  CURRENT TREATMENT: lenalidomide, romiplostim  HISTORY OF PRESENT ILLNESS: From the 05/23/2015 consult note:  "The patient was evaluated at her PCP's office 05/22/2015 with a complaint of DOE worsening over several months. Labwork was obtained including a CBC, whioch showed WBC 1.8, platelets 14K, and Hb 5.9 with an MCV of 97.5. DDimer was 1.04. Accordingly the patient was referred to the ED and was admitted 05/23/2015.   Labwork since admission includes a repeat CBC with WBC 1.7, HB 5.4, MCV 100.6 and platelets 10K. Creat was 1.04 with GFR 50. Reticulocytes are not elevated with abs 39.3 (1.7%) and LDH is normal at 188. Other labs are pending.  The patient tells me because of peripheral neuropathy she has been receiving monthly B-12 shots since 2005."  Bone marrow biopsy 05/25/2015 showed acute myeloid leukemia, with 17% blasts by flow cytometry, 24% by aspirate counts, and 20-30% by immunohistochemistry ([id number], and [id number]). The blasts were positive for CD 117 and focally for MPO. There were also myelodysplasia related changes in all 3 cell lines.  The patient's subsequent history is as detailed below.  INTERVAL HISTORY: [name] returns today for follow-up of her acute leukemia/myelodysplasia. She received 3 weeks of Lenalidomide '10mg'$  daily for her AML.  This started on 12/26.  She tells me that she has tolerated the treatment quite well with minimal side effects.  She says she couldn't tell that she was taking the medication.  She also continues to receive Romiplostim injections weekly.  She missed her injection Friday due to the weather.  She was short  of breath recently, and that improved tremendously with a transfusion with one unit of blood.  She does have some mild constipation that she contributes to her Venlafaxine.  She does have some continued grief and depression issues since her husband's passing back in October.  REVIEW OF SYSTEMS:  She denies any easy bruising, bleeding, headache, blurred vision, double vision, rash, nausea, vomiting, diarrhea, abdominal pain, chest pain, palpitations, cough, swelling, fevers, chills, or any other concerns.  A complete 14 point ROS was negative.   PAST MEDICAL HISTORY: Past Medical History:  Diagnosis Date  . Anemia   . Anxiety   . Arthritis    "hand joints; hips; back" (05/23/2015)  . Cancer ([place])    dec. 2016-leukemia  . Chronic lower back pain   . Depression   . GERD (gastroesophageal reflux disease)   . History of blood transfusion 05/23/2015   "Hgb 5.9"  . Hypertension   . Shortness of breath dyspnea    with exertion    PAST SURGICAL HISTORY: Past Surgical History:  Procedure Laterality Date  . Bone spur     R thigh  . CATARACT EXTRACTION W/ INTRAOCULAR LENS  IMPLANT, BILATERAL  ~ 2005  . DILATION AND CURETTAGE OF UTERUS    . ESOPHAGOGASTRODUODENOSCOPY (EGD) WITH PROPOFOL N/A 11/08/2014   Procedure: ESOPHAGOGASTRODUODENOSCOPY (EGD) WITH PROPOFOL;  Surgeon: Garlan Fair, MD;  Location: [facility];  Service: Endoscopy;  Laterality: N/A;  . JOINT REPLACEMENT    . TONSILLECTOMY  ~ 1950  . TOTAL KNEE ARTHROPLASTY  04/27/2012   Procedure: TOTAL KNEE ARTHROPLASTY;  Surgeon: Pietro Cassis  Alvan Dame, MD;  Location: [facility];  Service: Orthopedics;  Laterality: Right;  . TOTAL SHOULDER ARTHROPLASTY  09/13/2011   Procedure: TOTAL SHOULDER ARTHROPLASTY;  Surgeon: Augustin Schooling, MD;  Location: [facility];  Service: Orthopedics;  Laterality: Left;  LEFT SHOULDER REVERSED TOTAL SHOULDER ARTHROPLASTY    FAMILY HISTORY Family History  Problem Relation Age of Onset  . Heart attack Mother 2     Died age 67  . Heart disease Brother     Atrial fib  Patient's father died age 73 with prostate cancer; mother died atv74 with an MI. One brother, alive at 61; one sister with parkinson's. No blood or cancer problems otherwise in family  GYNECOLOGIC HISTORY:  No LMP recorded. Patient is postmenopausal. 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 He passed away in the fall of 2016/09/05. Son, [name] works for [employer] and sings in the [employer]; daughter [place] lives in [place], a homemaker; daughter [name] works at the surgical center in [employer] as an Therapist, sports. The patient has 7 gch. She is a Psychologist, forensic.   ADVANCED DIRECTIVES: in place; the patient's son [name] is her [place]: Social History  Substance Use Topics  . Smoking status: Never Smoker  . Smokeless tobacco: Never Used  . Alcohol use No     Colonoscopy:  PAP:  Bone density:  Lipid panel:  Allergies  Allergen Reactions  . Cozaar [Losartan] Other (See Comments)    Cause pt to lose her taste for foods  . Codeine Itching and Other (See Comments)    Makes feel crazy  PT STATES SHE ALSO CAN NOT TAKE THE SYNTHETIC CODEINES  . Hydrocodone Itching    Tolerates with benadryl  . Oxycodone Itching  . Tetanus Toxoids Swelling and Rash    Current Outpatient Prescriptions  Medication Sig Dispense Refill  . amLODipine (NORVASC) 2.5 MG tablet TAKE ONE TABLET BY MOUTH ONCE DAILY 90 tablet 5  . Cyanocobalamin 1000 MCG/ML KIT Inject 1,000 mcg as directed every 30 (thirty) days. B 12 shot 10 kit 1  . hydrochlorothiazide (HYDRODIURIL) 25 MG tablet Take 1 tablet (25 mg total) by mouth daily. Yearly physical due in Nov must see MD for refills 90 tablet 0  . lenalidomide (REVLIMID) 10 MG capsule Take 1 capsule (10 mg total) by mouth daily. 21 capsule 0  . Melatonin 5 MG TABS Take 2.5 mg by mouth at bedtime.    .  Multiple Vitamin (MULTIVITAMIN WITH MINERALS) TABS tablet Take 1 tablet by mouth daily.    . pantoprazole (PROTONIX) 40 MG tablet Take 1 tablet (40 mg total) by mouth every morning. 90 tablet 4  . valACYclovir (VALTREX) 1000 MG tablet Take 1,000 mg by mouth 2 (two) times daily.     . Venlafaxine HCl 150 MG TB24 TAKE ONE TABLET BY MOUTH ONCE DAILY 30 tablet 5  . Vitamin D, Ergocalciferol, (DRISDOL) 50000 units CAPS capsule Take 1 capsule (50,000 Units total) by mouth See admin instructions. Every other week. 30 capsule 1   No current facility-administered medications for this visit.    Facility-Administered Medications Ordered in Other Visits  Medication Dose Route Frequency Provider Last Rate Last Dose  . romiPLOStim (NPLATE) injection 60 mcg  60 mcg Subcutaneous Once [name], MD        OBJECTIVE: Older white woman Who appears stated age  61:  07/15/16 1105  BP: (!) 147/61  Pulse: 80  Resp: 17  Temp: 97.5 F (36.4 C)     Body mass index is 21.01 kg/m.    ECOG FS:1 - Symptomatic but completely ambulatory GENERAL: Patient is a well appearing older female in no acute distress, walks with cane HEENT:  Sclerae anicteric.  Oropharynx clear and moist. No ulcerations or evidence of oropharyngeal candidiasis. Neck is supple.  NODES:  No cervical, supraclavicular, infraclavicular, or axillary lymphadenopathy palpated.  LUNGS:  Clear to auscultation bilaterally.  No wheezes or rhonchi. HEART:  Regular rate and rhythm. No murmur appreciated. ABDOMEN:  Soft, nontender.  Positive, normoactive bowel sounds. No organomegaly palpated. MSK:  No focal spinal tenderness to palpation. Full range of motion bilaterally in the upper extremities. EXTREMITIES:  WWP, No peripheral edema, 2+ DP pulses bilaterally. SKIN:  Clear with no obvious rashes or skin changes. No nail dyscrasia. No signs of bruising. NEURO:  Nonfocal. Well oriented.  Appropriate affect.    LAB RESULTS:  CMP       Component Value Date/Time   NA 136 06/21/2016 1407   K 3.7 06/21/2016 1407   CL 103 02/29/2016 0820   CO2 28 06/21/2016 1407   GLUCOSE 88 06/21/2016 1407   BUN 14.1 06/21/2016 1407   CREATININE 0.8 06/21/2016 1407   CALCIUM 9.3 06/21/2016 1407   PROT 6.8 06/21/2016 1407   ALBUMIN 3.8 06/21/2016 1407   AST 18 06/21/2016 1407   ALT 15 06/21/2016 1407   ALKPHOS 96 06/21/2016 1407   BILITOT 0.59 06/21/2016 1407   GFRNONAA >60 02/29/2016 0820   GFRAA >60 02/29/2016 0820    INo results found for: SPEP, UPEP  Lab Results  Component Value Date   WBC 2.3 (L) 07/15/2016   NEUTROABS 0.2 (LL) 07/15/2016   HGB 10.9 (L) 07/15/2016   HCT 31.5 (L) 07/15/2016   MCV 101.6 (H) 07/15/2016   PLT 16 (L) 07/15/2016      Chemistry      Component Value Date/Time   NA 136 06/21/2016 1407   K 3.7 06/21/2016 1407   CL 103 02/29/2016 0820   CO2 28 06/21/2016 1407   BUN 14.1 06/21/2016 1407   CREATININE 0.8 06/21/2016 1407      Component Value Date/Time   CALCIUM 9.3 06/21/2016 1407   ALKPHOS 96 06/21/2016 1407   AST 18 06/21/2016 1407   ALT 15 06/21/2016 1407   BILITOT 0.59 06/21/2016 1407       No results found for: LABCA2  No components found for: LABCA125  No results for input(s): INR in the last 168 hours.  Urinalysis    Component Value Date/Time   COLORURINE YELLOW 04/21/2012 0946   APPEARANCEUR CLOUDY (A) 04/21/2012 0946   LABSPEC 1.019 04/21/2012 0946   PHURINE 7.0 04/21/2012 0946   GLUCOSEU NEGATIVE 04/21/2012 0946   HGBUR NEGATIVE 04/21/2012 0946   BILIRUBINUR NEGATIVE 04/21/2012 0946   KETONESUR NEGATIVE 04/21/2012 0946   PROTEINUR NEGATIVE 04/21/2012 0946   UROBILINOGEN 1.0 04/21/2012 0946   NITRITE NEGATIVE 04/21/2012 0946   LEUKOCYTESUR NEGATIVE 04/21/2012 0946    STUDIES:AND MICROSCOPIC INFORMATION  No results found. .  ASSESSMENT: 80 y.o. [name] woman with acute myeloid leukemia diagnosed by bone marrow biopsy 05/25/2015, with the blast count  between 17 and 30% depending on the method of determination.   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, with gain of 2 and 19  (b) considered allogenic transplant--not felt to be  a candidate  (c) all transfusion products to be irradiated  (1) started sQ azacitidine 06/05/2015, receiving 7 doses every 28 day cycle  (a) repeat bone marrow biopsy 02/29/2016 showed a mildly hypercellular marrow with dyspoiesis, but no increase in blasts either histologically or by flow cytometry  (b) repeat bone marrow biopsy 06/03/2016 shows recurrent acute myeloid leukemia  (2) immune thrombocytopenic purpura  (a) s/p IVIG 07/06/2015 (1g/kg x 2d) with excellent response  (b) romiplostim first dose 07/07/2015, repeated weekly as of 08/04/2015  (3) biopsy of an enlarged right axillary lymph node at [facility] 11/13/2015 was nondiagnostic.  (4) started lenalidomide at 10 mg daily beginning 06/18/2016 (3 weeks on, 1 week off)  PLAN:   [name] is doing well today, and tolerated the Lenalidomide without difficulty.  She continues to tolerate the Romiplostim well.  I reviewed her blood counts with her today in detail.  She continues to be neutropenic, and thrombocytopenic.  I reviewed that the AML is contributing to this.  I reviewed the patient/patient labs with Dr. Jana Hakim today.  Patient to proceed with Lenalidomide '10mg'$  daily, and continue with weekly Romiplostim.  Patient will f/u with me weekly on Mondays for the next three weeks, then on the fourth week see Dr. Jana Hakim.  She knows to contact us in the interim for any questions or concerns whatsoever.     Charlestine Massed, NP [place]  Charlestine Massed, NP   07/15/2016 12:32 PM    ADDENDUM: [name] is tolerating the lenalidomide remarkably well. I alerted our pharmacy oral chemotherapy specialist to make sure there will be no delays in her starting her next cycle which would be today or tomorrow.  At this point  we are going to have to see [name] on a weekly basis because of her cytopenias. We will transfuse for symptoms which usually occur when she gets a little bit below In her hemoglobin. We will also transfuse for any bleeding or a platelet count of 10,000 or less.  She is well alerted to methods of minimizing exposure to infection with a strong emphasis on hand washing.  I'm going to see her in 1 month. At that point if she wishes we can have a "family conference by phone" as we did previously.  I am concerned that if we do not have a response to this medication we will have a rapid decline. I want to make sure her children and particularly her son who is healthcare part of attorney is aware of the overall plan and prognosis.  I personally saw this patient and performed a substantive portion of this encounter with the listed APP documented above.   [name], MD Medical Oncology and Hematology [facility] [address] [place], [place] [address] Tel. [phone]    Fax. [phone]

## 2016-07-22 ENCOUNTER — Ambulatory Visit (HOSPITAL_BASED_OUTPATIENT_CLINIC_OR_DEPARTMENT_OTHER): Payer: Medicare Other

## 2016-07-22 ENCOUNTER — Ambulatory Visit (HOSPITAL_BASED_OUTPATIENT_CLINIC_OR_DEPARTMENT_OTHER): Payer: Medicare Other | Admitting: Adult Health

## 2016-07-22 ENCOUNTER — Encounter: Payer: Self-pay | Admitting: Adult Health

## 2016-07-22 ENCOUNTER — Other Ambulatory Visit (HOSPITAL_BASED_OUTPATIENT_CLINIC_OR_DEPARTMENT_OTHER): Payer: Medicare Other

## 2016-07-22 VITALS — BP 126/61 | HR 93 | Temp 97.5°F | Resp 18 | Ht 68.0 in | Wt 137.8 lb

## 2016-07-22 DIAGNOSIS — D693 Immune thrombocytopenic purpura: Secondary | ICD-10-CM

## 2016-07-22 DIAGNOSIS — C9200 Acute myeloblastic leukemia, not having achieved remission: Secondary | ICD-10-CM

## 2016-07-22 DIAGNOSIS — D61818 Other pancytopenia: Secondary | ICD-10-CM

## 2016-07-22 DIAGNOSIS — D696 Thrombocytopenia, unspecified: Secondary | ICD-10-CM

## 2016-07-22 DIAGNOSIS — C9202 Acute myeloblastic leukemia, in relapse: Secondary | ICD-10-CM

## 2016-07-22 DIAGNOSIS — C9201 Acute myeloblastic leukemia, in remission: Secondary | ICD-10-CM

## 2016-07-22 LAB — CBC WITH DIFFERENTIAL/PLATELET
BASO%: 0.6 % (ref 0.0–2.0)
BASOS ABS: 0 10*3/uL (ref 0.0–0.1)
EOS%: 1.9 % (ref 0.0–7.0)
Eosinophils Absolute: 0 10*3/uL (ref 0.0–0.5)
HCT: 29.6 % — ABNORMAL LOW (ref 34.8–46.6)
HGB: 10.4 g/dL — ABNORMAL LOW (ref 11.6–15.9)
LYMPH%: 70.1 % — ABNORMAL HIGH (ref 14.0–49.7)
MCH: 34.7 pg — AB (ref 25.1–34.0)
MCHC: 35.1 g/dL (ref 31.5–36.0)
MCV: 99.1 fL (ref 79.5–101.0)
MONO#: 0.3 10*3/uL (ref 0.1–0.9)
MONO%: 20.9 % — AB (ref 0.0–14.0)
NEUT#: 0.1 10*3/uL — CL (ref 1.5–6.5)
NEUT%: 6.5 % — ABNORMAL LOW (ref 38.4–76.8)
Platelets: 12 10*3/uL — ABNORMAL LOW (ref 145–400)
RBC: 2.98 10*6/uL — ABNORMAL LOW (ref 3.70–5.45)
RDW: 21.3 % — AB (ref 11.2–14.5)
WBC: 1.7 10*3/uL — AB (ref 3.9–10.3)
lymph#: 1.2 10*3/uL (ref 0.9–3.3)

## 2016-07-22 LAB — CHCC SMEAR

## 2016-07-22 LAB — TECHNOLOGIST REVIEW

## 2016-07-22 MED ORDER — ROMIPLOSTIM INJECTION 500 MCG
8.0000 ug/kg | Freq: Once | SUBCUTANEOUS | Status: AC
  Administered 2016-07-22: 500 ug via SUBCUTANEOUS
  Filled 2016-07-22: qty 1

## 2016-07-22 NOTE — Patient Instructions (Signed)
Romiplostim injection What is this medicine? ROMIPLOSTIM (roe mi PLOE stim) helps your body make more platelets. This medicine is used to treat low platelets caused by chronic idiopathic thrombocytopenic purpura (ITP). This medicine may be used for other purposes; ask your health care provider or pharmacist if you have questions. What should I tell my health care provider before I take this medicine? They need to know if you have any of these conditions: -cancer or myelodysplastic syndrome -low blood counts, like low white cell, platelet, or red cell counts -take medicines that treat or prevent blood clots -an unusual or allergic reaction to romiplostim, mannitol, other medicines, foods, dyes, or preservatives -pregnant or trying to get pregnant -breast-feeding How should I use this medicine? This medicine is for injection under the skin. It is given by a health care professional in a hospital or clinic setting. A special MedGuide will be given to you before your injection. Read this information carefully each time. Talk to your pediatrician regarding the use of this medicine in children. Special care may be needed. Overdosage: If you think you have taken too much of this medicine contact a poison control center or emergency room at once. NOTE: This medicine is only for you. Do not share this medicine with others. What if I miss a dose? It is important not to miss your dose. Call your doctor or health care professional if you are unable to keep an appointment. What may interact with this medicine? Interactions are not expected. This list may not describe all possible interactions. Give your health care provider a list of all the medicines, herbs, non-prescription drugs, or dietary supplements you use. Also tell them if you smoke, drink alcohol, or use illegal drugs. Some items may interact with your medicine. What should I watch for while using this medicine? Your condition will be monitored  carefully while you are receiving this medicine. Visit your prescriber or health care professional for regular checks on your progress and for the needed blood tests. It is important to keep all appointments. What side effects may I notice from receiving this medicine? Side effects that you should report to your doctor or health care professional as soon as possible: -allergic reactions like skin rash, itching or hives, swelling of the face, lips, or tongue -shortness of breath, chest pain, swelling in a leg -unusual bleeding or bruising Side effects that usually do not require medical attention (report to your doctor or health care professional if they continue or are bothersome): -dizziness -headache -muscle aches -pain in arms and legs -stomach pain -trouble sleeping This list may not describe all possible side effects. Call your doctor for medical advice about side effects. You may report side effects to FDA at 1-800-FDA-1088. Where should I keep my medicine? This drug is given in a hospital or clinic and will not be stored at home. NOTE: This sheet is a summary. It may not cover all possible information. If you have questions about this medicine, talk to your doctor, pharmacist, or health care provider.    2016, Elsevier/Gold Standard. (2008-02-08 15:13:04)  

## 2016-07-22 NOTE — Progress Notes (Signed)
[place]     ID: OAKLIE DURRETT DOB: 06/12/1936  MR#: [id number]  LPF#:[id number]  Patient Care Team: Golden Circle, FNP as PCP - General (Family Medicine) PCP: [name], FNP, [name] GYN: SU:  OTHER MD: [name], [name], [name]  CHIEF COMPLAINT: Acute myeloid leukemia; ITP  CURRENT TREATMENT: lenalidomide, romiplostim  HISTORY OF PRESENT ILLNESS: From the 05/23/2015 consult note:  "The patient was evaluated at her PCP's office 05/22/2015 with a complaint of DOE worsening over several months. Labwork was obtained including a CBC, whioch showed WBC 1.8, platelets 14K, and Hb 5.9 with an MCV of 97.5. DDimer was 1.04. Accordingly the patient was referred to the ED and was admitted 05/23/2015.   Labwork since admission includes a repeat CBC with WBC 1.7, HB 5.4, MCV 100.6 and platelets 10K. Creat was 1.04 with GFR 50. Reticulocytes are not elevated with abs 39.3 (1.7%) and LDH is normal at 188. Other labs are pending.  The patient tells me because of peripheral neuropathy she has been receiving monthly B-12 shots since 2005."  Bone marrow biopsy 05/25/2015 showed acute myeloid leukemia, with 17% blasts by flow cytometry, 24% by aspirate counts, and 20-30% by immunohistochemistry ([id number], and 898). The blasts were positive for CD 117 and focally for MPO. There were also myelodysplasia related changes in all 3 cell lines.  The patient's subsequent history is as detailed below.  INTERVAL HISTORY: Shanda returns today for follow-up of her acute leukemia/myelodysplasia. She was seen last week by myself along with Dr. Jana Hakim.  We reviewed her lab work and she was started on her second cycle of Lenalidomide.  She is doing well today.  She started her second cycle on 1/24 (she is cycle 2 day 7).  She continues to receive Romiplostim weekly on Mondays.  She has not had any difficulty with taking the Lenalidomide.  She did lean  forward and hit her forehead on the corner of her kitchen cabinets a few days ago.  She says that she had a large hematoma that has slowly decreased as time has passed.  She denies any loss of consciousness, headache, blurred/double vision, focal weakness, speech change, dizziness, numbness, or any other difficulty since this happened.   She denies any fevers, chills, abdominal pain, nausea, vomiting, rash, swelling, chest pain/palpitations, or any further concerns.    REVIEW OF SYSTEMS:  A complete 14 point ROS was conducted and was negative.   PAST MEDICAL HISTORY: Past Medical History:  Diagnosis Date  . Anemia   . Anxiety   . Arthritis    "hand joints; hips; back" (05/23/2015)  . Cancer ([facility])    dec. 2016-leukemia  . Chronic lower back pain   . Depression   . GERD (gastroesophageal reflux disease)   . History of blood transfusion 05/23/2015   "Hgb 5.9"  . Hypertension   . Shortness of breath dyspnea    with exertion    PAST SURGICAL HISTORY: Past Surgical History:  Procedure Laterality Date  . Bone spur     R thigh  . CATARACT EXTRACTION W/ INTRAOCULAR LENS  IMPLANT, BILATERAL  ~ 2005  . DILATION AND CURETTAGE OF UTERUS    . ESOPHAGOGASTRODUODENOSCOPY (EGD) WITH PROPOFOL N/A 11/08/2014   Procedure: ESOPHAGOGASTRODUODENOSCOPY (EGD) WITH PROPOFOL;  Surgeon: Garlan Fair, MD;  [facility];  Service: Endoscopy;  Laterality: N/A;  . JOINT REPLACEMENT    . TONSILLECTOMY  ~ 1950  . TOTAL KNEE ARTHROPLASTY  04/27/2012  Procedure: TOTAL KNEE ARTHROPLASTY;  Surgeon: Mauri Pole, MD;  [facility];  Service: Orthopedics;  Laterality: Right;  . TOTAL SHOULDER ARTHROPLASTY  09/13/2011   Procedure: TOTAL SHOULDER ARTHROPLASTY;  Surgeon: Augustin Schooling, MD;  Location: [facility];  Service: Orthopedics;  Laterality: Left;  LEFT SHOULDER REVERSED TOTAL SHOULDER ARTHROPLASTY    FAMILY HISTORY Family History  Problem Relation Age of Onset  . Heart attack Mother 61     Died age 21  . Heart disease Brother     Atrial fib  Patient's father died age 26 with prostate cancer; mother died at 85 with an MI. One brother, alive at 17; one sister with parkinson's. No blood or cancer problems otherwise in family  GYNECOLOGIC HISTORY:  No LMP recorded. Patient is postmenopausal. 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 He passed away in the fall of 09-01-16. Son, [name] works for [employer] and sings in the [employer]; daughter [place] lives in [place], a homemaker; daughter [name] works at the surgical center in [employer] as an Therapist, sports. The patient has 7 gch. She is a Psychologist, forensic.   ADVANCED DIRECTIVES: in place; the patient's son [name] is her [place]: Social History  Substance Use Topics  . Smoking status: Never Smoker  . Smokeless tobacco: Never Used  . Alcohol use No      Allergies  Allergen Reactions  . Cozaar [Losartan] Other (See Comments)    Cause pt to lose her taste for foods  . Codeine Itching and Other (See Comments)    Makes feel crazy  PT STATES SHE ALSO CAN NOT TAKE THE SYNTHETIC CODEINES  . Hydrocodone Itching    Tolerates with benadryl  . Oxycodone Itching  . Tetanus Toxoids Swelling and Rash    Current Outpatient Prescriptions  Medication Sig Dispense Refill  . amLODipine (NORVASC) 2.5 MG tablet TAKE ONE TABLET BY MOUTH ONCE DAILY 90 tablet 5  . Cyanocobalamin 1000 MCG/ML KIT Inject 1,000 mcg as directed every 30 (thirty) days. B 12 shot 10 kit 1  . hydrochlorothiazide (HYDRODIURIL) 25 MG tablet Take 1 tablet (25 mg total) by mouth daily. Yearly physical due in Nov must see MD for refills 90 tablet 0  . lenalidomide (REVLIMID) 10 MG capsule Take 1 capsule (10 mg total) by mouth daily. 21 capsule 0  . Melatonin 5 MG TABS Take 2.5 mg by mouth at bedtime.    . Multiple Vitamin (MULTIVITAMIN WITH MINERALS) TABS tablet  Take 1 tablet by mouth daily.    . pantoprazole (PROTONIX) 40 MG tablet Take 1 tablet (40 mg total) by mouth every morning. 90 tablet 4  . valACYclovir (VALTREX) 1000 MG tablet Take 1,000 mg by mouth 2 (two) times daily.     . Venlafaxine HCl 150 MG TB24 TAKE ONE TABLET BY MOUTH ONCE DAILY 30 tablet 5  . Vitamin D, Ergocalciferol, (DRISDOL) 50000 units CAPS capsule Take 1 capsule (50,000 Units total) by mouth See admin instructions. Every other week. 30 capsule 1   No current facility-administered medications for this visit.    Facility-Administered Medications Ordered in Other Visits  Medication Dose Route Frequency Provider Last Rate Last Dose  . romiPLOStim (NPLATE) injection 60 mcg  60 mcg Subcutaneous Once [name], MD        OBJECTIVE: Older white woman Who appears stated age  9:  07/22/16 1114  BP: 126/61  Pulse: 93  Resp: 18  Temp: 97.5 F (36.4 C)     Body mass index is 20.95 kg/m.    ECOG FS:1 - Symptomatic but completely ambulatory GENERAL: Patient is a well appearing older female in no acute distress, walks with cane, unaccompanied today HEENT:  Sclerae anicteric.  Oropharynx clear and moist. No ulcerations or evidence of oropharyngeal candidiasis. Neck is supple. Large amount of ecchymosis along forehead, PERRL NODES:  No cervical, supraclavicular, infraclavicular, or axillary lymphadenopathy palpated.  LUNGS:  Clear to auscultation bilaterally.  No wheezes or rhonchi. HEART:  Regular rate and rhythm. No murmur appreciated. ABDOMEN:  Soft, nontender.  Positive, normoactive bowel sounds. No organomegaly palpated. MSK:  No focal spinal tenderness to palpation.  EXTREMITIES:  WWP, No peripheral edema, 2+ DP pulses bilaterally. Strength is 5/5 bilaterally x 4 extremities SKIN:  Clear with no obvious rashes or skin changes.  No signs of bruising. NEURO:  Nonfocal. Well oriented.  Appropriate affect.    LAB RESULTS:  CMP     Component Value Date/Time    NA 136 06/21/2016 1407   K 3.7 06/21/2016 1407   CL 103 02/29/2016 0820   CO2 28 06/21/2016 1407   GLUCOSE 88 06/21/2016 1407   BUN 14.1 06/21/2016 1407   CREATININE 0.8 06/21/2016 1407   CALCIUM 9.3 06/21/2016 1407   PROT 6.8 06/21/2016 1407   ALBUMIN 3.8 06/21/2016 1407   AST 18 06/21/2016 1407   ALT 15 06/21/2016 1407   ALKPHOS 96 06/21/2016 1407   BILITOT 0.59 06/21/2016 1407   GFRNONAA >60 02/29/2016 0820   GFRAA >60 02/29/2016 0820    INo results found for: SPEP, UPEP  Lab Results  Component Value Date   WBC 1.7 (L) 07/22/2016   NEUTROABS 0.1 (LL) 07/22/2016   HGB 10.4 (L) 07/22/2016   HCT 29.6 (L) 07/22/2016   MCV 99.1 07/22/2016   PLT 12 (L) 07/22/2016      Chemistry      Component Value Date/Time   NA 136 06/21/2016 1407   K 3.7 06/21/2016 1407   CL 103 02/29/2016 0820   CO2 28 06/21/2016 1407   BUN 14.1 06/21/2016 1407   CREATININE 0.8 06/21/2016 1407      Component Value Date/Time   CALCIUM 9.3 06/21/2016 1407   ALKPHOS 96 06/21/2016 1407   AST 18 06/21/2016 1407   ALT 15 06/21/2016 1407   BILITOT 0.59 06/21/2016 1407       No results found for: LABCA2  No components found for: LABCA125  No results for input(s): INR in the last 168 hours.  Urinalysis    Component Value Date/Time   COLORURINE YELLOW 04/21/2012 0946   APPEARANCEUR CLOUDY (A) 04/21/2012 0946   LABSPEC 1.019 04/21/2012 0946   PHURINE 7.0 04/21/2012 0946   GLUCOSEU NEGATIVE 04/21/2012 0946   HGBUR NEGATIVE 04/21/2012 0946   BILIRUBINUR NEGATIVE 04/21/2012 0946   KETONESUR NEGATIVE 04/21/2012 0946   PROTEINUR NEGATIVE 04/21/2012 0946   UROBILINOGEN 1.0 04/21/2012 0946   NITRITE NEGATIVE 04/21/2012 0946   LEUKOCYTESUR NEGATIVE 04/21/2012 0946    STUDIES:AND MICROSCOPIC INFORMATION  No results found. .  ASSESSMENT: 81 y.o. [name] woman with acute myeloid leukemia diagnosed by bone marrow biopsy 05/25/2015, with the blast count between 17 and 30% depending on the  method of determination.   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, with gain of 2 and 19  (b) considered allogenic transplant--not felt  to be a candidate  (c) all transfusion products to be irradiated  (1) started sQ azacitidine 06/05/2015, receiving 7 doses every 28 day cycle  (a) repeat bone marrow biopsy 02/29/2016 showed a mildly hypercellular marrow with dyspoiesis, but no increase in blasts either histologically or by flow cytometry  (b) repeat bone marrow biopsy 06/03/2016 shows recurrent acute myeloid leukemia  (2) immune thrombocytopenic purpura  (a) s/p IVIG 07/06/2015 (1g/kg x 2d) with excellent response  (b) romiplostim first dose 07/07/2015, repeated weekly as of 08/04/2015  (3) biopsy of an enlarged right axillary lymph node at [facility] 11/13/2015 was nondiagnostic.  (4) started lenalidomide at 10 mg daily beginning 06/18/2016 (3 weeks on, 1 week off)  PLAN:  Patient will continue on Lenalidomide daily for her AML.  I reviewed her labs with her in detail.  Her ANC is 100 today and her platelets are 12.  Her hemoglobin is stable.  This is lower than last week and she is discouraged with these results.  She will receive the Romiplostim today.  She and I reviewed neutropenic precautions and thrombocytopenic precautions in detail today.  She knows when to call or go to ER.  I did review patient with Dr. Jana Hakim along with her labs.  She will return next week for lab and appointment with me.    I spent 30 minutes with patient and 50% was spent counseling patient face to face and coordinating care.     Charlestine Massed, NP [place]  Charlestine Massed, NP   07/22/2016 2:23 PM

## 2016-07-25 ENCOUNTER — Telehealth: Payer: Self-pay

## 2016-07-25 MED ORDER — CIPROFLOXACIN HCL 500 MG PO TABS: 500.0000 mg | ORAL_TABLET | Freq: Two times a day (BID) | ORAL | 0 refills | Status: DC

## 2016-07-25 NOTE — Telephone Encounter (Signed)
Pt called with sx of UTI. Pain with urination worsening over last few days, no blood in urine, she is incontinent so cannot distinguish urgency or frequency. S/w Dr Jana Hakim and rx Cipro to [facility] pharmacy. Pt is appreciative.

## 2016-07-26 LAB — CHROMOSOME ANALYSIS, BONE MARROW

## 2016-07-29 ENCOUNTER — Other Ambulatory Visit (HOSPITAL_BASED_OUTPATIENT_CLINIC_OR_DEPARTMENT_OTHER): Payer: Medicare Other

## 2016-07-29 ENCOUNTER — Ambulatory Visit: Payer: Medicare Other

## 2016-07-29 ENCOUNTER — Ambulatory Visit (HOSPITAL_BASED_OUTPATIENT_CLINIC_OR_DEPARTMENT_OTHER): Payer: Medicare Other

## 2016-07-29 ENCOUNTER — Ambulatory Visit (HOSPITAL_BASED_OUTPATIENT_CLINIC_OR_DEPARTMENT_OTHER): Payer: Medicare Other | Admitting: Adult Health

## 2016-07-29 ENCOUNTER — Encounter: Payer: Self-pay | Admitting: Adult Health

## 2016-07-29 ENCOUNTER — Other Ambulatory Visit: Payer: Self-pay | Admitting: *Deleted

## 2016-07-29 VITALS — BP 115/42 | HR 73 | Temp 98.8°F | Resp 18

## 2016-07-29 VITALS — BP 108/46 | HR 97 | Temp 97.5°F | Resp 18 | Ht 68.0 in | Wt 137.8 lb

## 2016-07-29 DIAGNOSIS — D693 Immune thrombocytopenic purpura: Secondary | ICD-10-CM | POA: Diagnosis not present

## 2016-07-29 DIAGNOSIS — K1230 Oral mucositis (ulcerative), unspecified: Secondary | ICD-10-CM | POA: Diagnosis not present

## 2016-07-29 DIAGNOSIS — C9200 Acute myeloblastic leukemia, not having achieved remission: Secondary | ICD-10-CM

## 2016-07-29 DIAGNOSIS — C9202 Acute myeloblastic leukemia, in relapse: Secondary | ICD-10-CM

## 2016-07-29 DIAGNOSIS — D696 Thrombocytopenia, unspecified: Secondary | ICD-10-CM

## 2016-07-29 DIAGNOSIS — C9201 Acute myeloblastic leukemia, in remission: Secondary | ICD-10-CM

## 2016-07-29 DIAGNOSIS — D61818 Other pancytopenia: Secondary | ICD-10-CM

## 2016-07-29 LAB — MANUAL DIFFERENTIAL
ALC: 1.4 10*3/uL (ref 0.9–3.3)
ANC (CHCC MAN DIFF): 0.1 10*3/uL — AB (ref 1.5–6.5)
BLASTS: 12 % — AB (ref 0–0)
Band Neutrophils: 0 % (ref 0–10)
Basophil: 0 % (ref 0–2)
EOS: 1 % (ref 0–7)
LYMPH: 75 % — AB (ref 14–49)
MONO: 8 % (ref 0–14)
MYELOCYTES: 0 % (ref 0–0)
Metamyelocytes: 0 % (ref 0–0)
NRBC: 0 % (ref 0–0)
Other Cell: 0 % (ref 0–0)
PLT EST: DECREASED
PROMYELO: 0 % (ref 0–0)
SEG: 4 % — ABNORMAL LOW (ref 38–77)
Variant Lymph: 0 % (ref 0–0)

## 2016-07-29 LAB — CHCC SMEAR

## 2016-07-29 LAB — CBC WITH DIFFERENTIAL/PLATELET
HCT: 27 % — ABNORMAL LOW (ref 34.8–46.6)
HEMOGLOBIN: 9.3 g/dL — AB (ref 11.6–15.9)
MCH: 34.4 pg — ABNORMAL HIGH (ref 25.1–34.0)
MCHC: 34.2 g/dL (ref 31.5–36.0)
MCV: 100.4 fL (ref 79.5–101.0)
Platelets: 3 10*3/uL — CL (ref 145–400)
RBC: 2.69 10*6/uL — AB (ref 3.70–5.45)
RDW: 20.9 % — AB (ref 11.2–14.5)
WBC: 1.8 10*3/uL — AB (ref 3.9–10.3)

## 2016-07-29 LAB — SAMPLE TO BLOOD BANK

## 2016-07-29 MED ORDER — VALACYCLOVIR HCL 1 G PO TABS: 1000.0000 mg | ORAL_TABLET | Freq: Two times a day (BID) | ORAL | 0 refills | Status: DC

## 2016-07-29 MED ORDER — ACETAMINOPHEN 325 MG PO TABS
ORAL_TABLET | ORAL | Status: AC
  Filled 2016-07-29: qty 2

## 2016-07-29 MED ORDER — DIPHENHYDRAMINE HCL 25 MG PO CAPS
ORAL_CAPSULE | ORAL | Status: AC
  Filled 2016-07-29: qty 1

## 2016-07-29 MED ORDER — SODIUM CHLORIDE 0.9 % IV SOLN
250.0000 mL | Freq: Once | INTRAVENOUS | Status: AC
  Administered 2016-07-29: 250 mL via INTRAVENOUS

## 2016-07-29 MED ORDER — SODIUM CHLORIDE 0.9 % IV SOLN
INTRAVENOUS | Status: DC
  Administered 2016-07-29: 14:00:00 via INTRAVENOUS

## 2016-07-29 MED ORDER — MAGIC MOUTHWASH W/LIDOCAINE: 5.0000 mL | Freq: Four times a day (QID) | ORAL | 0 refills | Status: DC | PRN

## 2016-07-29 MED ORDER — DIPHENHYDRAMINE HCL 25 MG PO CAPS
25.0000 mg | ORAL_CAPSULE | Freq: Once | ORAL | Status: AC
  Administered 2016-07-29: 25 mg via ORAL

## 2016-07-29 MED ORDER — ACETAMINOPHEN 325 MG PO TABS
650.0000 mg | ORAL_TABLET | Freq: Once | ORAL | Status: AC
  Administered 2016-07-29: 650 mg via ORAL

## 2016-07-29 MED ORDER — ROMIPLOSTIM INJECTION 500 MCG
9.0000 ug/kg | Freq: Once | SUBCUTANEOUS | Status: AC
  Administered 2016-07-29: 565 ug via SUBCUTANEOUS
  Filled 2016-07-29: qty 0.13

## 2016-07-29 NOTE — Progress Notes (Signed)
1600: Charlestine Massed, NP notified of patient's BP.  Okay to discharge patient home.  Notified pt. To not take HCTZ.  Okay to take amlodipine tomorrow per NP if she is not feeling dizzy.  Pt verbalized understanding.  She knows to follow up on Wednesday and will call if she feels worse in the interim.  A friend is coming to drive her home due to taking benadryl prior to platelet transfusion.

## 2016-07-29 NOTE — Addendum Note (Signed)
Addended by: [name] on: 07/29/2016 01:34 PM   Modules accepted: Orders, SmartSet

## 2016-07-29 NOTE — Patient Instructions (Signed)
Platelet Transfusion Introduction A platelet transfusion is a procedure in which you receive donated platelets through an IV tube. Platelets are tiny pieces of blood cells. When a blood vessel is damaged, platelets collect in the damaged area to help form a blood clot. This begins the healing process. If your platelet count gets too low, your blood may have trouble clotting. You may need a platelet transfusion if you have a condition that causes a low number of platelets (thrombocytopenia). A platelet transfusion may be used to stop or prevent bleeding. Tell a health care provider about:  Any allergies you have.  All medicines you are taking, including vitamins, herbs, eye drops, creams, and over-the-counter medicines.  Any problems you or family members have had with anesthetic medicines.  Any blood disorders you have.  Any surgeries you have had.  Any medical conditions you have.  Any reactions you have had during a previous transfusion. What are the risks? Generally, this is a safe procedure. However, problems may occur, including:  Fever with or without chills. The fever usually occurs within the first 4 hours of the transfusion and returns to normal within 48 hours.  Allergic reaction. The reaction is most commonly caused by antibodies your body creates against substances in the transfusion. Signs of an allergic reaction may include itching, hives, difficulty breathing, shock, or low blood pressure.  Sudden (acute) or delayed hemolytic reaction. This rare reaction can occur during the transfusion and up to 28 days after the transfusion. The reaction usually occurs when your body's defense system (immune system) attacks the new platelets. Signs of a hemolytic reaction may include fever, headache, difficulty breathing, low blood pressure, a rapid heartbeat, or pain in your back, abdomen, chest, or IV site.  Transfusion-related acute lung injury (TRALI). TRALI can occur within hours of  a transfusion, or several days later. This is a rare reaction that causes lung damage. The cause is not known.  Infection. Signs of this rare complication may include fever, chills, vomiting, a rapid heartbeat, or low blood pressure. What happens before the procedure?  You may have a blood test to determine your blood type. This is necessary to find out what kind ofplatelets best matches your platelets.  If you have had an allergic reaction to a transfusion in the past, you may be given medicine to help prevent a reaction. Take this medicine only as directed by your health care provider.  Your temperature, blood pressure, and pulse will be monitored before the transfusion. What happens during the procedure?  An IV will be started in your hand or arm.  The transfusion will be attached to your IV tubing. The bag of donated platelets will be attached to your IV tube andgiven into your vein.  Your temperature, blood pressure, and pulse will be monitored regularly during the transfusion. This monitoring is done to help detect early signs of a transfusion reaction.  If you have any signs or symptoms of a reaction, your transfusion will be stopped and you may be given medicine.  When your transfusion is complete, your IV will be removed.  Pressure may be applied to the IV site for a few minutes.  A bandage (dressing) will be applied. The procedure may vary among health care providers and hospitals. What happens after the procedure?  Your blood pressure, temperature, and pulse will be monitored regularly. This information is not intended to replace advice given to you by your health care provider. Make sure you discuss any questions you have  with your health care provider. Document Released: 04/07/2007 Document Revised: 11/16/2015 Document Reviewed: 04/20/2014  2017 Elsevier

## 2016-07-29 NOTE — Progress Notes (Signed)
[name]     ID: [name] DOB: June 11, 1936  MR#: [id number]  [facility]#:[id number]  Patient Care Team: [facility], FNP as PCP - General (Family Medicine) PCP: [name], FNP, [name] GYN: SU:  OTHER MD: [name], [name], [name]  CHIEF COMPLAINT: Acute myeloid leukemia; ITP  CURRENT TREATMENT: lenalidomide, romiplostim  HISTORY OF PRESENT ILLNESS: From the 05/23/2015 consult note:  "The patient was evaluated at her PCP's office 05/22/2015 with a complaint of DOE worsening over several months. Labwork was obtained including a CBC, whioch showed WBC 1.8, platelets 14K, and Hb 5.9 with an MCV of 97.5. DDimer was 1.04. Accordingly the patient was referred to the ED and was admitted 05/23/2015.   Labwork since admission includes a repeat CBC with WBC 1.7, HB 5.4, MCV 100.6 and platelets 10K. Creat was 1.04 with GFR 50. Reticulocytes are not elevated with abs 39.3 (1.7%) and LDH is normal at 188. Other labs are pending.  The patient tells me because of peripheral neuropathy she has been receiving monthly B-12 shots since 2005."  Bone marrow biopsy 05/25/2015 showed acute myeloid leukemia, with 17% blasts by flow cytometry, 24% by aspirate counts, and 20-30% by immunohistochemistry ([id number], and [id number]). The blasts were positive for CD 117 and focally for MPO. There were also myelodysplasia related changes in all 3 cell lines.  The patient's subsequent history is as detailed below.  INTERVAL HISTORY: [name] returns today for follow-up of her acute leukemia/myelodysplasia.  She is not feeling very well today.  She is weak and has developed mouth ulcers over the weekend.  There are two in her buccal mucosa that are bothering her today.  She is dizzy.  She has had decreased oral intake since developing these ulcers and hasn't eaten anything this morning.  She denies fevers, chills, but is taking cipro bid due to a urinary tract  infection she developed on Thursday.  She says that her symptom with the UTI was dysuria, and that has resolved.  She does take HCTZ and Amlodipine daily.    REVIEW OF SYSTEMS:  A complete 14 point ROS was conducted and was negative.   PAST MEDICAL HISTORY: Past Medical History:  Diagnosis Date  . Anemia   . Anxiety   . Arthritis    "hand joints; hips; back" (05/23/2015)  . Cancer ([place])    dec. 2016-leukemia  . Chronic lower back pain   . Depression   . GERD (gastroesophageal reflux disease)   . History of blood transfusion 05/23/2015   "Hgb 5.9"  . Hypertension   . Shortness of breath dyspnea    with exertion    PAST SURGICAL HISTORY: Past Surgical History:  Procedure Laterality Date  . Bone spur     R thigh  . CATARACT EXTRACTION W/ INTRAOCULAR LENS  IMPLANT, BILATERAL  ~ 2005  . DILATION AND CURETTAGE OF UTERUS    . ESOPHAGOGASTRODUODENOSCOPY (EGD) WITH PROPOFOL N/A 11/08/2014   Procedure: ESOPHAGOGASTRODUODENOSCOPY (EGD) WITH PROPOFOL;  Surgeon: Garlan Fair, MD;  Location: [facility];  Service: Endoscopy;  Laterality: N/A;  . JOINT REPLACEMENT    . TONSILLECTOMY  ~ 1950  . TOTAL KNEE ARTHROPLASTY  04/27/2012   Procedure: TOTAL KNEE ARTHROPLASTY;  Surgeon: Mauri Pole, MD;  Location: [facility];  Service: Orthopedics;  Laterality: Right;  . TOTAL SHOULDER ARTHROPLASTY  09/13/2011   Procedure: TOTAL SHOULDER ARTHROPLASTY;  Surgeon: Augustin Schooling, MD;  Location: [employer];  Service: Orthopedics;  Laterality: Left;  LEFT SHOULDER REVERSED TOTAL SHOULDER ARTHROPLASTY    FAMILY HISTORY Family History  Problem Relation Age of Onset  . Heart attack Mother 53    Died age 74  . Heart disease Brother     Atrial fib  Patient's father died age 26 with prostate cancer; mother died at 64 with an MI. One brother, alive at 13; one sister with parkinson's. No blood or cancer problems otherwise in family  GYNECOLOGIC HISTORY:  No LMP recorded. Patient is  postmenopausal. Menarche age 56, first live birth age 17, she is GXP3; menopause age 48, no HR  SOCIAL HISTORY:  Grade school Pharmacist, hospital, retired; husband was a Higher education careers adviser. He passed away in the fall of 2016/09/18. Son, [name] works for [employer] and sings in the [employer]; daughter Newport lives in [place], a homemaker; daughter [name] works at the surgical center in [employer] as an Therapist, sports. The patient has 7 gch. She is a Psychologist, forensic.   ADVANCED DIRECTIVES: in place; the patient's son [name] is her Marshall: Social History  Substance Use Topics  . Smoking status: Never Smoker  . Smokeless tobacco: Never Used  . Alcohol use No      Allergies  Allergen Reactions  . Cozaar [Losartan] Other (See Comments)    Cause pt to lose her taste for foods  . Codeine Itching and Other (See Comments)    Makes feel crazy  PT STATES SHE ALSO CAN NOT TAKE THE SYNTHETIC CODEINES  . Hydrocodone Itching    Tolerates with benadryl  . Oxycodone Itching  . Tetanus Toxoids Swelling and Rash    Current Outpatient Prescriptions  Medication Sig Dispense Refill  . amLODipine (NORVASC) 2.5 MG tablet TAKE ONE TABLET BY MOUTH ONCE DAILY 90 tablet 5  . ciprofloxacin (CIPRO) 500 MG tablet Take 1 tablet (500 mg total) by mouth 2 (two) times daily. 10 tablet 0  . Cyanocobalamin 1000 MCG/ML KIT Inject 1,000 mcg as directed every 30 (thirty) days. B 12 shot 10 kit 1  . hydrochlorothiazide (HYDRODIURIL) 25 MG tablet Take 1 tablet (25 mg total) by mouth daily. Yearly physical due in Nov must see MD for refills 90 tablet 0  . lenalidomide (REVLIMID) 10 MG capsule Take 1 capsule (10 mg total) by mouth daily. 21 capsule 0  . Melatonin 5 MG TABS Take 2.5 mg by mouth at bedtime.    . Multiple Vitamin (MULTIVITAMIN WITH MINERALS) TABS tablet Take 1 tablet by mouth daily.    . pantoprazole (PROTONIX) 40 MG tablet Take 1 tablet (40 mg total) by mouth every morning. 90 tablet 4  . valACYclovir (VALTREX) 1000 MG tablet Take  1,000 mg by mouth 2 (two) times daily.     . Venlafaxine HCl 150 MG TB24 TAKE ONE TABLET BY MOUTH ONCE DAILY 30 tablet 5  . Vitamin D, Ergocalciferol, (DRISDOL) 50000 units CAPS capsule Take 1 capsule (50,000 Units total) by mouth See admin instructions. Every other week. 30 capsule 1   No current facility-administered medications for this visit.    Facility-Administered Medications Ordered in Other Visits  Medication Dose Route Frequency Provider Last Rate Last Dose  . romiPLOStim (NPLATE) injection 60 mcg  60 mcg Subcutaneous Once [name], MD        OBJECTIVE: Older white woman Who appears stated age  89:   07/29/16 1050  BP: (!) 108/46  Pulse: 97  Resp: 18  Temp: 97.5 F (36.4 C)     Body  mass index is 20.95 kg/m.    ECOG FS:1 - Symptomatic but completely ambulatory GENERAL: Patient is a well appearing older female who appears tired and fatigued, walks with cane, unaccompanied today HEENT:  Sclerae anicteric.  Ulcerations noted on right buccal mucosa, and left inner buccal mucosa.  Left buccal lesion is purpuric.  Petechiae noted to upper palate, tongue, along with posterior pharynx. Neck is supple. Ecchymosis has faded on forehead and is now green, however still present, PERRL NODES:  No cervical, supraclavicular, infraclavicular, or axillary lymphadenopathy palpated.  LUNGS:  Clear to auscultation bilaterally.  No wheezes or rhonchi. HEART:  Regular rate and rhythm. No murmur appreciated. ABDOMEN:  Soft, nontender.  Positive, normoactive bowel sounds. No organomegaly palpated. MSK:  No focal spinal tenderness to palpation.  EXTREMITIES:  WWP, No peripheral edema, 2+ DP pulses bilaterally.  SKIN:  Clear with no obvious rashes or skin changes.  No signs of bruising. NEURO:  Nonfocal. Well oriented.  Appropriate affect.    LAB RESULTS:  CMP     Component Value Date/Time   NA 136 06/21/2016 1407   K 3.7 06/21/2016 1407   CL 103 02/29/2016 0820   CO2 28  06/21/2016 1407   GLUCOSE 88 06/21/2016 1407   BUN 14.1 06/21/2016 1407   CREATININE 0.8 06/21/2016 1407   CALCIUM 9.3 06/21/2016 1407   PROT 6.8 06/21/2016 1407   ALBUMIN 3.8 06/21/2016 1407   AST 18 06/21/2016 1407   ALT 15 06/21/2016 1407   ALKPHOS 96 06/21/2016 1407   BILITOT 0.59 06/21/2016 1407   GFRNONAA >60 02/29/2016 0820   GFRAA >60 02/29/2016 0820    INo results found for: SPEP, UPEP  Lab Results  Component Value Date   WBC 1.8 (L) 07/29/2016   NEUTROABS 0.1 (LL) 07/22/2016   HGB 9.3 (L) 07/29/2016   HCT 27.0 (L) 07/29/2016   MCV 100.4 07/29/2016   PLT 3 (LL) 07/29/2016      Chemistry      Component Value Date/Time   NA 136 06/21/2016 1407   K 3.7 06/21/2016 1407   CL 103 02/29/2016 0820   CO2 28 06/21/2016 1407   BUN 14.1 06/21/2016 1407   CREATININE 0.8 06/21/2016 1407      Component Value Date/Time   CALCIUM 9.3 06/21/2016 1407   ALKPHOS 96 06/21/2016 1407   AST 18 06/21/2016 1407   ALT 15 06/21/2016 1407   BILITOT 0.59 06/21/2016 1407       No results found for: LABCA2  No components found for: LABCA125  No results for input(s): INR in the last 168 hours.  Urinalysis    Component Value Date/Time   COLORURINE YELLOW 04/21/2012 0946   APPEARANCEUR CLOUDY (A) 04/21/2012 0946   LABSPEC 1.019 04/21/2012 0946   PHURINE 7.0 04/21/2012 0946   GLUCOSEU NEGATIVE 04/21/2012 0946   HGBUR NEGATIVE 04/21/2012 0946   BILIRUBINUR NEGATIVE 04/21/2012 0946   KETONESUR NEGATIVE 04/21/2012 0946   PROTEINUR NEGATIVE 04/21/2012 0946   UROBILINOGEN 1.0 04/21/2012 0946   NITRITE NEGATIVE 04/21/2012 0946   LEUKOCYTESUR NEGATIVE 04/21/2012 0946    STUDIES:AND MICROSCOPIC INFORMATION  No results found. .  ASSESSMENT: 81 y.o. [place] woman with acute myeloid leukemia diagnosed by bone marrow biopsy 05/25/2015, with the blast count between 17 and 30% depending on the method of determination.   (a) Cytogenetics found 5q- but also 6q- and loss of  12,13,14 and 18, with gain of 2 and 19  (b) considered allogenic transplant--not felt to be a candidate  (  c) all transfusion products to be irradiated  (1) started sQ azacitidine 06/05/2015, receiving 7 doses every 28 day cycle  (a) repeat bone marrow biopsy 02/29/2016 showed a mildly hypercellular marrow with dyspoiesis, but no increase in blasts either histologically or by flow cytometry  (b) repeat bone marrow biopsy 06/03/2016 shows recurrent acute myeloid leukemia  (2) immune thrombocytopenic purpura  (a) s/p IVIG 07/06/2015 (1g/kg x 2d) with excellent response  (b) romiplostim first dose 07/07/2015, repeated weekly  (3) biopsy of an enlarged right axillary lymph node [facility] 11/13/2015 was nondiagnostic.  (4) started lenalidomide at 10 mg daily beginning 06/18/2016 (3 weeks on, 1 week off)  PLAN:    Mrs. Viglione is declining.  She has an increase in blasts in her peripheral blood in addition to mucositis, increased thrombocytopenia, along with neutropenia.  She has decreased oral intake, and a mild element of dehydration.   Her platelet count is 3.  Due to this she will receive platelets today.  She will stop the lenalidomide.  She will receive IV fluids.  I will send in some magic mouthwash and valtrex for or mucositis.  She will return on Wednesday for lab, appointment, and possible fluids.  I explained to her that with these problems, I am very concerned that she could be hospitalized and that we would need to watch her closely this week to keep her out of the hospital.  I encouraged her to eat soft bland foods, in addition to ensure.  At Mrs. Rotolo's request, I called and spoke with her daughter [name] and explained the concerns with her.  I was very honest that I did not think her situation was very optimistic.  [name] will be checking on her mom (Mrs. Wragg) today after she gets off of work.  I reviewed this patient with Dr. Lindi Adie in detail who agrees with above plan.    I  spent 50 minutes with patient and greater than 50% was spent counseling patient face to face and coordinating care.     Charlestine Massed, NP [place]  Charlestine Massed, NP   07/29/2016 11:41 AM

## 2016-07-31 ENCOUNTER — Telehealth: Payer: Self-pay

## 2016-07-31 ENCOUNTER — Ambulatory Visit: Payer: Medicare Other | Admitting: Adult Health

## 2016-07-31 ENCOUNTER — Ambulatory Visit: Payer: Medicare Other

## 2016-07-31 ENCOUNTER — Other Ambulatory Visit: Payer: Medicare Other

## 2016-07-31 NOTE — Telephone Encounter (Signed)
Provided patient with apopintments for 08/01/16.  Pt voiced understanding.

## 2016-07-31 NOTE — Progress Notes (Deleted)
[place]     ID: [name] DOB: Oct 27, 1935  MR#: [id number]  PXT#:[id number]  Patient Care Team: Golden Circle, FNP as PCP - General (Family Medicine) PCP: [name], FNP, [name] GYN: SU:  OTHER MD: [name], [name], [name]  CHIEF COMPLAINT: Acute myeloid leukemia; ITP  CURRENT TREATMENT: lenalidomide, romiplostim  HISTORY OF PRESENT ILLNESS: From the 05/23/2015 consult note:  "The patient was evaluated at her PCP's office 05/22/2015 with a complaint of DOE worsening over several months. Labwork was obtained including a CBC, whioch showed WBC 1.8, platelets 14K, and Hb 5.9 with an MCV of 97.5. DDimer was 1.04. Accordingly the patient was referred to the ED and was admitted 05/23/2015.   Labwork since admission includes a repeat CBC with WBC 1.7, HB 5.4, MCV 100.6 and platelets 10K. Creat was 1.04 with GFR 50. Reticulocytes are not elevated with abs 39.3 (1.7%) and LDH is normal at 188. Other labs are pending.  The patient tells me because of peripheral neuropathy she has been receiving monthly B-12 shots since 2005."  Bone marrow biopsy 05/25/2015 showed acute myeloid leukemia, with 17% blasts by flow cytometry, 24% by aspirate counts, and 20-30% by immunohistochemistry ([id number], and 898). The blasts were positive for CD 117 and focally for MPO. There were also myelodysplasia related changes in all 3 cell lines.  The patient's subsequent history is as detailed below.  INTERVAL HISTORY:   REVIEW OF SYSTEMS:  A complete 14 point ROS was conducted and was negative.   PAST MEDICAL HISTORY: Past Medical History:  Diagnosis Date  . Anemia   . Anxiety   . Arthritis    "hand joints; hips; back" (05/23/2015)  . Cancer ([place])    dec. 2016-leukemia  . Chronic lower back pain   . Depression   . GERD (gastroesophageal reflux disease)   . History of blood transfusion 05/23/2015   "Hgb 5.9"  . Hypertension   .  Shortness of breath dyspnea    with exertion    PAST SURGICAL HISTORY: Past Surgical History:  Procedure Laterality Date  . Bone spur     R thigh  . CATARACT EXTRACTION W/ INTRAOCULAR LENS  IMPLANT, BILATERAL  ~ 2005  . DILATION AND CURETTAGE OF UTERUS    . ESOPHAGOGASTRODUODENOSCOPY (EGD) WITH PROPOFOL N/A 11/08/2014   Procedure: ESOPHAGOGASTRODUODENOSCOPY (EGD) WITH PROPOFOL;  Surgeon: Garlan Fair, MD;  [facility]: [facility];  Service: Endoscopy;  Laterality: N/A;  . JOINT REPLACEMENT    . TONSILLECTOMY  ~ 1950  . TOTAL KNEE ARTHROPLASTY  04/27/2012   Procedure: TOTAL KNEE ARTHROPLASTY;  Surgeon: Mauri Pole, MD;  Location: [facility];  Service: Orthopedics;  Laterality: Right;  . TOTAL SHOULDER ARTHROPLASTY  09/13/2011   Procedure: TOTAL SHOULDER ARTHROPLASTY;  Surgeon: Augustin Schooling, MD;  Location: [place];  Service: Orthopedics;  Laterality: Left;  LEFT SHOULDER REVERSED TOTAL SHOULDER ARTHROPLASTY    FAMILY HISTORY Family History  Problem Relation Age of Onset  . Heart attack Mother 73    Died age 28  . Heart disease Brother     Atrial fib  Patient's father died age 57 with prostate cancer; mother died at 29 with an MI. One brother, alive at 14; one sister with parkinson's. No blood or cancer problems otherwise in family  GYNECOLOGIC HISTORY:  No LMP recorded. Patient is postmenopausal. 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 He passed away in the fall of August 24, 2016. Son, [name] works for [employer] and sings in the [employer]; daughter [place] lives in [place], a homemaker; daughter [name] works at the surgical center in [employer] as an Therapist, sports. The patient has 7 gch. She is a Psychologist, forensic.   ADVANCED DIRECTIVES: in place; the patient's son [name] is her [place]: Social History  Substance Use Topics  . Smoking status: Never Smoker  . Smokeless  tobacco: Never Used  . Alcohol use No      Allergies  Allergen Reactions  . Cozaar [Losartan] Other (See Comments)    Cause pt to lose her taste for foods  . Codeine Itching and Other (See Comments)    Makes feel crazy  PT STATES SHE ALSO CAN NOT TAKE THE SYNTHETIC CODEINES  . Hydrocodone Itching    Tolerates with benadryl  . Oxycodone Itching  . Tetanus Toxoids Swelling and Rash    Current Outpatient Prescriptions  Medication Sig Dispense Refill  . amLODipine (NORVASC) 2.5 MG tablet TAKE ONE TABLET BY MOUTH ONCE DAILY 90 tablet 5  . ciprofloxacin (CIPRO) 500 MG tablet Take 1 tablet (500 mg total) by mouth 2 (two) times daily. 10 tablet 0  . Cyanocobalamin 1000 MCG/ML KIT Inject 1,000 mcg as directed every 30 (thirty) days. B 12 shot 10 kit 1  . hydrochlorothiazide (HYDRODIURIL) 25 MG tablet Take 1 tablet (25 mg total) by mouth daily. Yearly physical due in Nov must see MD for refills 90 tablet 0  . lenalidomide (REVLIMID) 10 MG capsule Take 1 capsule (10 mg total) by mouth daily. 21 capsule 0  . magic mouthwash w/lidocaine SOLN Take 5 mLs by mouth 4 (four) times daily as needed for mouth pain. Swish and spit 240 mL 0  . Melatonin 5 MG TABS Take 2.5 mg by mouth at bedtime.    . Multiple Vitamin (MULTIVITAMIN WITH MINERALS) TABS tablet Take 1 tablet by mouth daily.    . pantoprazole (PROTONIX) 40 MG tablet Take 1 tablet (40 mg total) by mouth every morning. 90 tablet 4  . valACYclovir (VALTREX) 1000 MG tablet Take 1 tablet (1,000 mg total) by mouth 2 (two) times daily. 60 tablet 0  . Venlafaxine HCl 150 MG TB24 TAKE ONE TABLET BY MOUTH ONCE DAILY 30 tablet 5  . Vitamin D, Ergocalciferol, (DRISDOL) 50000 units CAPS capsule Take 1 capsule (50,000 Units total) by mouth See admin instructions. Every other week. 30 capsule 1   No current facility-administered medications for this visit.    Facility-Administered Medications Ordered in Other Visits  Medication Dose Route Frequency Provider  Last Rate Last Dose  . romiPLOStim (NPLATE) injection 60 mcg  60 mcg Subcutaneous Once [name], MD        OBJECTIVE: Older white woman Who appears stated age  There were no vitals filed for this visit.   There is no height or weight on file to calculate BMI.    ECOG FS:1 - Symptomatic but completely ambulatory    LAB RESULTS:  CMP     Component Value Date/Time   NA 136 06/21/2016 1407   K 3.7 06/21/2016 1407   CL 103 02/29/2016 0820   CO2 28 06/21/2016 1407   GLUCOSE 88 06/21/2016 1407   BUN 14.1 06/21/2016 1407   CREATININE 0.8 06/21/2016 1407   CALCIUM 9.3 06/21/2016 1407   PROT 6.8 06/21/2016 1407   ALBUMIN 3.8 06/21/2016 1407  AST 18 06/21/2016 1407   ALT 15 06/21/2016 1407   ALKPHOS 96 06/21/2016 1407   BILITOT 0.59 06/21/2016 1407   GFRNONAA >60 02/29/2016 0820   GFRAA >60 02/29/2016 0820    INo results found for: SPEP, UPEP  Lab Results  Component Value Date   WBC 1.8 (L) 07/29/2016   NEUTROABS 0.1 (LL) 07/22/2016   HGB 9.3 (L) 07/29/2016   HCT 27.0 (L) 07/29/2016   MCV 100.4 07/29/2016   PLT 3 (LL) 07/29/2016      Chemistry      Component Value Date/Time   NA 136 06/21/2016 1407   K 3.7 06/21/2016 1407   CL 103 02/29/2016 0820   CO2 28 06/21/2016 1407   BUN 14.1 06/21/2016 1407   CREATININE 0.8 06/21/2016 1407      Component Value Date/Time   CALCIUM 9.3 06/21/2016 1407   ALKPHOS 96 06/21/2016 1407   AST 18 06/21/2016 1407   ALT 15 06/21/2016 1407   BILITOT 0.59 06/21/2016 1407       No results found for: LABCA2  No components found for: LABCA125  No results for input(s): INR in the last 168 hours.  Urinalysis    Component Value Date/Time   COLORURINE YELLOW 04/21/2012 0946   APPEARANCEUR CLOUDY (A) 04/21/2012 0946   LABSPEC 1.019 04/21/2012 0946   PHURINE 7.0 04/21/2012 0946   GLUCOSEU NEGATIVE 04/21/2012 0946   HGBUR NEGATIVE 04/21/2012 0946   BILIRUBINUR NEGATIVE 04/21/2012 0946   KETONESUR NEGATIVE 04/21/2012 0946    PROTEINUR NEGATIVE 04/21/2012 0946   UROBILINOGEN 1.0 04/21/2012 0946   NITRITE NEGATIVE 04/21/2012 0946   LEUKOCYTESUR NEGATIVE 04/21/2012 0946    STUDIES:AND MICROSCOPIC INFORMATION  No results found. .  ASSESSMENT: 81 y.o. [place] woman with acute myeloid leukemia diagnosed by bone marrow biopsy 05/25/2015, with the blast count between 17 and 30% depending on the method of determination.   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, with gain of 2 and 19  (b) considered allogenic transplant--not felt to be a candidate  (c) all transfusion products to be irradiated  (1) started sQ azacitidine 06/05/2015, receiving 7 doses every 28 day cycle  (a) repeat bone marrow biopsy 02/29/2016 showed a mildly hypercellular marrow with dyspoiesis, but no increase in blasts either histologically or by flow cytometry  (b) repeat bone marrow biopsy 06/03/2016 shows recurrent acute myeloid leukemia  (2) immune thrombocytopenic purpura  (a) s/p IVIG 07/06/2015 (1g/kg x 2d) with excellent response  (b) romiplostim first dose 07/07/2015, repeated weekly  (3) biopsy of an enlarged right axillary lymph node at [facility] 11/13/2015 was nondiagnostic.  (4) started lenalidomide at 10 mg daily beginning 06/18/2016 (3 weeks on, 1 week off)  PLAN:      I spent 50 minutes with patient and greater than 50% was spent counseling patient face to face and coordinating care.     Charlestine Massed, NP [place]  Charlestine Massed, NP   07/31/2016 8:36 AM

## 2016-08-01 ENCOUNTER — Encounter: Payer: Self-pay | Admitting: Adult Health

## 2016-08-01 ENCOUNTER — Other Ambulatory Visit (HOSPITAL_BASED_OUTPATIENT_CLINIC_OR_DEPARTMENT_OTHER): Payer: Medicare Other

## 2016-08-01 ENCOUNTER — Ambulatory Visit (HOSPITAL_BASED_OUTPATIENT_CLINIC_OR_DEPARTMENT_OTHER): Payer: Medicare Other | Admitting: Adult Health

## 2016-08-01 VITALS — BP 155/54 | HR 84 | Temp 97.7°F | Resp 18 | Wt 139.4 lb

## 2016-08-01 DIAGNOSIS — C9200 Acute myeloblastic leukemia, not having achieved remission: Secondary | ICD-10-CM

## 2016-08-01 DIAGNOSIS — D693 Immune thrombocytopenic purpura: Secondary | ICD-10-CM

## 2016-08-01 DIAGNOSIS — C9202 Acute myeloblastic leukemia, in relapse: Secondary | ICD-10-CM | POA: Diagnosis not present

## 2016-08-01 DIAGNOSIS — C9201 Acute myeloblastic leukemia, in remission: Secondary | ICD-10-CM

## 2016-08-01 LAB — MANUAL DIFFERENTIAL
ALC: 2.7 10*3/uL (ref 0.9–3.3)
ANC (CHCC manual diff): 0.1 10*3/uL — CL (ref 1.5–6.5)
BLASTS: 14 % — AB (ref 0–0)
Band Neutrophils: 0 % (ref 0–10)
Basophil: 0 % (ref 0–2)
EOS%: 0 % (ref 0–7)
LYMPH: 80 % — ABNORMAL HIGH (ref 14–49)
MONO: 4 % (ref 0–14)
Metamyelocytes: 0 % (ref 0–0)
Myelocytes: 0 % (ref 0–0)
NRBC: 0 % (ref 0–0)
Other Cell: 0 % (ref 0–0)
PLT EST: DECREASED
PROMYELO: 0 % (ref 0–0)
SEG: 2 % — AB (ref 38–77)
Variant Lymph: 0 % (ref 0–0)

## 2016-08-01 LAB — CBC WITH DIFFERENTIAL/PLATELET
BASO%: 0.3 % (ref 0.0–2.0)
Basophils Absolute: 0 10*3/uL (ref 0.0–0.1)
EOS%: 0.7 % (ref 0.0–7.0)
Eosinophils Absolute: 0 10*3/uL (ref 0.0–0.5)
HEMATOCRIT: 25.1 % — AB (ref 34.8–46.6)
HEMOGLOBIN: 8.8 g/dL — AB (ref 11.6–15.9)
MCH: 34.8 pg — ABNORMAL HIGH (ref 25.1–34.0)
MCHC: 35 g/dL (ref 31.5–36.0)
MCV: 99.5 fL (ref 79.5–101.0)
NEUTROS ABS: 0.1 10*3/uL — AB (ref 1.5–6.5)
PLATELETS: 23 10*3/uL — AB (ref 145–400)
RBC: 2.52 10*6/uL — ABNORMAL LOW (ref 3.70–5.45)
RDW: 21.2 % — ABNORMAL HIGH (ref 11.2–14.5)
WBC: 3.4 10*3/uL — ABNORMAL LOW (ref 3.9–10.3)

## 2016-08-01 LAB — CHCC SMEAR

## 2016-08-01 NOTE — Progress Notes (Signed)
[name]     ID: SHAVAUGHN SEIDL DOB: 1935-09-10  MR#: [id number]  MGN#:[id number]  Patient Care Team: Golden Circle, FNP as PCP - General (Family Medicine) PCP: [name], FNP, [name] GYN: SU:  OTHER MD: [name], [name], [name]  CHIEF COMPLAINT: Acute myeloid leukemia; ITP  CURRENT TREATMENT: lenalidomide, romiplostim  HISTORY OF PRESENT ILLNESS: From the 05/23/2015 consult note:  "The patient was evaluated at her PCP's office 05/22/2015 with a complaint of DOE worsening over several months. Labwork was obtained including a CBC, whioch showed WBC 1.8, platelets 14K, and Hb 5.9 with an MCV of 97.5. DDimer was 1.04. Accordingly the patient was referred to the ED and was admitted 05/23/2015.   Labwork since admission includes a repeat CBC with WBC 1.7, HB 5.4, MCV 100.6 and platelets 10K. Creat was 1.04 with GFR 50. Reticulocytes are not elevated with abs 39.3 (1.7%) and LDH is normal at 188. Other labs are pending.  The patient tells me because of peripheral neuropathy she has been receiving monthly B-12 shots since 2005."  Bone marrow biopsy 05/25/2015 showed acute myeloid leukemia, with 17% blasts by flow cytometry, 24% by aspirate counts, and 20-30% by immunohistochemistry (FZB [id number], and 898). The blasts were positive for CD 117 and focally for MPO. There were also myelodysplasia related changes in all 3 cell lines.  The patient's subsequent history is as detailed below.  INTERVAL HISTORY: [name] is here today for follow up of her AML.  Her therapy with Lenalidomide was stopped on Monday.  She received a platelet transfusion for a plt count of three.  She had mucositis and started taking valtrex bid.  Her mucositis is much improved, along with her plt count is now 23.  She denies any fever or chills.  I discontinued her HCTZ and her blood pressure is much improved along with her dizziness.  She says that today she feels  better than she has felt all week.  No easy bruising or bleeding.   REVIEW OF SYSTEMS:  A complete 14 point ROS was conducted and was negative.   PAST MEDICAL HISTORY: Past Medical History:  Diagnosis Date  . Anemia   . Anxiety   . Arthritis    "hand joints; hips; back" (05/23/2015)  . Cancer ([place])    dec. 2016-leukemia  . Chronic lower back pain   . Depression   . GERD (gastroesophageal reflux disease)   . History of blood transfusion 05/23/2015   "Hgb 5.9"  . Hypertension   . Shortness of breath dyspnea    with exertion    PAST SURGICAL HISTORY: Past Surgical History:  Procedure Laterality Date  . Bone spur     R thigh  . CATARACT EXTRACTION W/ INTRAOCULAR LENS  IMPLANT, BILATERAL  ~ 2005  . DILATION AND CURETTAGE OF UTERUS    . ESOPHAGOGASTRODUODENOSCOPY (EGD) WITH PROPOFOL N/A 11/08/2014   Procedure: ESOPHAGOGASTRODUODENOSCOPY (EGD) WITH PROPOFOL;  Surgeon: Garlan Fair, MD;  [facility];  Service: Endoscopy;  Laterality: N/A;  . JOINT REPLACEMENT    . TONSILLECTOMY  ~ 1950  . TOTAL KNEE ARTHROPLASTY  04/27/2012   Procedure: TOTAL KNEE ARTHROPLASTY;  Surgeon: Mauri Pole, MD;  [facility];  Service: Orthopedics;  Laterality: Right;  . TOTAL SHOULDER ARTHROPLASTY  09/13/2011   Procedure: TOTAL SHOULDER ARTHROPLASTY;  Surgeon: Augustin Schooling, MD;  Location: [place];  Service: Orthopedics;  Laterality: Left;  LEFT SHOULDER REVERSED TOTAL SHOULDER ARTHROPLASTY  FAMILY HISTORY Family History  Problem Relation Age of Onset  . Heart attack Mother 55    Died age 12  . Heart disease Brother     Atrial fib  Patient's father died age 39 with prostate cancer; mother died at 1 with an MI. One brother, alive at 17; one sister with parkinson's. No blood or cancer problems otherwise in family  GYNECOLOGIC HISTORY:  No LMP recorded. Patient is postmenopausal. 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 He passed away in the fall of 2016/09/01. Son, [name] works for [employer] and sings in the [employer]; daughter [place] lives in [place], a homemaker; daughter [name] works at the surgical center in [employer] as an Therapist, sports. The patient has 7 gch. She is a Psychologist, forensic.   ADVANCED DIRECTIVES: in place; the patient's son [name] is her [place]: Social History  Substance Use Topics  . Smoking status: Never Smoker  . Smokeless tobacco: Never Used  . Alcohol use No      Allergies  Allergen Reactions  . Cozaar [Losartan] Other (See Comments)    Cause pt to lose her taste for foods  . Codeine Itching and Other (See Comments)    Makes feel crazy  PT STATES SHE ALSO CAN NOT TAKE THE SYNTHETIC CODEINES  . Hydrocodone Itching    Tolerates with benadryl  . Oxycodone Itching  . Tetanus Toxoids Swelling and Rash    Current Outpatient Prescriptions  Medication Sig Dispense Refill  . amLODipine (NORVASC) 2.5 MG tablet TAKE ONE TABLET BY MOUTH ONCE DAILY 90 tablet 5  . ciprofloxacin (CIPRO) 500 MG tablet Take 1 tablet (500 mg total) by mouth 2 (two) times daily. 10 tablet 0  . Cyanocobalamin 1000 MCG/ML KIT Inject 1,000 mcg as directed every 30 (thirty) days. B 12 shot 10 kit 1  . hydrochlorothiazide (HYDRODIURIL) 25 MG tablet Take 1 tablet (25 mg total) by mouth daily. Yearly physical due in Nov must see MD for refills 90 tablet 0  . lenalidomide (REVLIMID) 10 MG capsule Take 1 capsule (10 mg total) by mouth daily. 21 capsule 0  . magic mouthwash w/lidocaine SOLN Take 5 mLs by mouth 4 (four) times daily as needed for mouth pain. Swish and spit 240 mL 0  . Melatonin 5 MG TABS Take 2.5 mg by mouth at bedtime.    . Multiple Vitamin (MULTIVITAMIN WITH MINERALS) TABS tablet Take 1 tablet by mouth daily.    . pantoprazole (PROTONIX) 40 MG tablet Take 1 tablet (40 mg total) by mouth every morning. 90 tablet 4  . valACYclovir  (VALTREX) 1000 MG tablet Take 1 tablet (1,000 mg total) by mouth 2 (two) times daily. 60 tablet 0  . Venlafaxine HCl 150 MG TB24 TAKE ONE TABLET BY MOUTH ONCE DAILY 30 tablet 5  . Vitamin D, Ergocalciferol, (DRISDOL) 50000 units CAPS capsule Take 1 capsule (50,000 Units total) by mouth See admin instructions. Every other week. 30 capsule 1   No current facility-administered medications for this visit.    Facility-Administered Medications Ordered in Other Visits  Medication Dose Route Frequency Provider Last Rate Last Dose  . romiPLOStim (NPLATE) injection 60 mcg  60 mcg Subcutaneous Once [name], MD        OBJECTIVE: Older white woman Who appears stated age  14:   08/01/16 1256  BP: (!) 155/54  Pulse: 84  Resp: 18  Temp: 97.7 F (36.5 C)     Body mass index is 21.2 kg/m.    ECOG FS:1 - Symptomatic but completely ambulatory GENERAL: Patient is a well appearing in no acute distress HEENT:  Sclerae anicteric.  Oropharynx clear and moist.  Previous oral ulcerations almost completely healed. No ulcerations or evidence of oropharyngeal candidiasis. Neck is supple.  NODES:  No cervical, supraclavicular, or axillary lymphadenopathy palpated.  BREAST EXAM:  Deferred. LUNGS:  Clear to auscultation bilaterally.  No wheezes or rhonchi. HEART:  Regular rate and rhythm. No murmur appreciated. ABDOMEN:  Soft, nontender.  Positive, normoactive bowel sounds. No organomegaly palpated. MSK:  No focal spinal tenderness to palpation. Full range of motion bilaterally in the upper extremities. EXTREMITIES:  No peripheral edema.   SKIN:  Clear with no obvious rashes or skin changes. No nail dyscrasia. NEURO:  Nonfocal. Well oriented.  Appropriate affect.  LAB RESULTS:  CMP     Component Value Date/Time   NA 136 06/21/2016 1407   K 3.7 06/21/2016 1407   CL 103 02/29/2016 0820   CO2 28 06/21/2016 1407   GLUCOSE 88 06/21/2016 1407   BUN 14.1 06/21/2016 1407   CREATININE 0.8 06/21/2016  1407   CALCIUM 9.3 06/21/2016 1407   PROT 6.8 06/21/2016 1407   ALBUMIN 3.8 06/21/2016 1407   AST 18 06/21/2016 1407   ALT 15 06/21/2016 1407   ALKPHOS 96 06/21/2016 1407   BILITOT 0.59 06/21/2016 1407   GFRNONAA >60 02/29/2016 0820   GFRAA >60 02/29/2016 0820    INo results found for: SPEP, UPEP  Lab Results  Component Value Date   WBC 3.4 (L) 08/01/2016   NEUTROABS 0.1 (LL) 08/01/2016   HGB 8.8 (L) 08/01/2016   HCT 25.1 (L) 08/01/2016   MCV 99.5 08/01/2016   PLT 23 (L) 08/01/2016      Chemistry      Component Value Date/Time   NA 136 06/21/2016 1407   K 3.7 06/21/2016 1407   CL 103 02/29/2016 0820   CO2 28 06/21/2016 1407   BUN 14.1 06/21/2016 1407   CREATININE 0.8 06/21/2016 1407      Component Value Date/Time   CALCIUM 9.3 06/21/2016 1407   ALKPHOS 96 06/21/2016 1407   AST 18 06/21/2016 1407   ALT 15 06/21/2016 1407   BILITOT 0.59 06/21/2016 1407       No results found for: LABCA2  No components found for: LABCA125  No results for input(s): INR in the last 168 hours.  Urinalysis    Component Value Date/Time   COLORURINE YELLOW 04/21/2012 0946   APPEARANCEUR CLOUDY (A) 04/21/2012 0946   LABSPEC 1.019 04/21/2012 0946   PHURINE 7.0 04/21/2012 0946   GLUCOSEU NEGATIVE 04/21/2012 0946   HGBUR NEGATIVE 04/21/2012 0946   BILIRUBINUR NEGATIVE 04/21/2012 0946   KETONESUR NEGATIVE 04/21/2012 0946   PROTEINUR NEGATIVE 04/21/2012 0946   UROBILINOGEN 1.0 04/21/2012 0946   NITRITE NEGATIVE 04/21/2012 0946   LEUKOCYTESUR NEGATIVE 04/21/2012 0946    STUDIES:AND MICROSCOPIC INFORMATION  No results found. .  ASSESSMENT: 81 y.o. [place] woman with acute myeloid leukemia diagnosed by bone marrow biopsy 05/25/2015, with the blast count between 17 and 30% depending on the method of determination.   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, with gain of 2 and 19  (b) considered allogenic transplant--not felt to be a candidate  (c)  all transfusion products to be irradiated  (1) started  sQ azacitidine 06/05/2015, receiving 7 doses every 28 day cycle  (a) repeat bone marrow biopsy 02/29/2016 showed a mildly hypercellular marrow with dyspoiesis, but no increase in blasts either histologically or by flow cytometry  (b) repeat bone marrow biopsy 06/03/2016 shows recurrent acute myeloid leukemia  (2) immune thrombocytopenic purpura  (a) s/p IVIG 07/06/2015 (1g/kg x 2d) with excellent response  (b) romiplostim first dose 07/07/2015, repeated weekly  (3) biopsy of an enlarged right axillary lymph node at [facility] 11/13/2015 was nondiagnostic.  (4) started lenalidomide at 10 mg daily beginning 06/18/2016 (3 weeks on, 1 week off)  PLAN:  [name] is doing well today.  I did inform her that Monday afternoon her blasts came back at 12%, and that was up from the week prior.  She was upset to hear this news.  She wanted to know if a transplant would work or if she could move up her appointment with Dr. Nadara Mustard.  I told her that I was concerned about her leukemia.  She clinically looks much better than she did on Monday.  She was again encouraged to eat and drink regularly.  She will return on Monday.  We will set her up for possible transfusion as well.  She knows to call us between now and then if she needs anything.     I spent 30 minutes with patient and greater than 50% was spent counseling patient face to face and coordinating care.     Alisha Massed, NP [place]  Alisha Massed, NP   08/01/2016 4:01 PM

## 2016-08-02 ENCOUNTER — Other Ambulatory Visit: Payer: Self-pay | Admitting: *Deleted

## 2016-08-02 ENCOUNTER — Telehealth: Payer: Self-pay | Admitting: Adult Health

## 2016-08-02 DIAGNOSIS — C9202 Acute myeloblastic leukemia, in relapse: Secondary | ICD-10-CM

## 2016-08-02 NOTE — Telephone Encounter (Signed)
Attempted to call patient x 2 to let her know that her blasts have increased in percentage.  Couldn't leave message.  Will discuss with her on Monday at her appointment.

## 2016-08-05 ENCOUNTER — Ambulatory Visit: Payer: Medicare Other

## 2016-08-05 ENCOUNTER — Other Ambulatory Visit: Payer: Self-pay | Admitting: Emergency Medicine

## 2016-08-05 ENCOUNTER — Other Ambulatory Visit: Payer: Self-pay | Admitting: *Deleted

## 2016-08-05 ENCOUNTER — Ambulatory Visit (HOSPITAL_BASED_OUTPATIENT_CLINIC_OR_DEPARTMENT_OTHER): Payer: Medicare Other | Admitting: Adult Health

## 2016-08-05 ENCOUNTER — Encounter: Payer: Self-pay | Admitting: Adult Health

## 2016-08-05 ENCOUNTER — Other Ambulatory Visit (HOSPITAL_BASED_OUTPATIENT_CLINIC_OR_DEPARTMENT_OTHER): Payer: Medicare Other

## 2016-08-05 ENCOUNTER — Ambulatory Visit (HOSPITAL_BASED_OUTPATIENT_CLINIC_OR_DEPARTMENT_OTHER): Payer: Medicare Other

## 2016-08-05 ENCOUNTER — Ambulatory Visit (HOSPITAL_COMMUNITY)
Admission: RE | Admit: 2016-08-05 | Discharge: 2016-08-05 | Disposition: A | Discharge status: Home / Self Care | Payer: Medicare Other | Source: Ambulatory Visit | Attending: Oncology | Admitting: Oncology

## 2016-08-05 VITALS — BP 147/44 | HR 88 | Temp 97.7°F | Resp 18 | Ht 68.0 in | Wt 141.8 lb

## 2016-08-05 DIAGNOSIS — D630 Anemia in neoplastic disease: Secondary | ICD-10-CM | POA: Diagnosis not present

## 2016-08-05 DIAGNOSIS — D649 Anemia, unspecified: Secondary | ICD-10-CM

## 2016-08-05 DIAGNOSIS — C9202 Acute myeloblastic leukemia, in relapse: Secondary | ICD-10-CM

## 2016-08-05 DIAGNOSIS — D693 Immune thrombocytopenic purpura: Secondary | ICD-10-CM | POA: Diagnosis not present

## 2016-08-05 LAB — CBC WITH DIFFERENTIAL/PLATELET
HCT: 22 % — ABNORMAL LOW (ref 34.8–46.6)
HGB: 7.7 g/dL — ABNORMAL LOW (ref 11.6–15.9)
MCH: 34.9 pg — AB (ref 25.1–34.0)
MCHC: 34.7 g/dL (ref 31.5–36.0)
MCV: 100.5 fL (ref 79.5–101.0)
Platelets: 9 10*3/uL — CL (ref 145–400)
RBC: 2.19 10*6/uL — AB (ref 3.70–5.45)
RDW: 21.3 % — AB (ref 11.2–14.5)
WBC: 6.1 10*3/uL (ref 3.9–10.3)

## 2016-08-05 LAB — MANUAL DIFFERENTIAL
ALC: 2 10*3/uL (ref 0.9–3.3)
ANC (CHCC manual diff): 0.1 10*3/uL — CL (ref 1.5–6.5)
Blasts: 49 % — ABNORMAL HIGH (ref 0–0)
LYMPH: 33 % (ref 14–49)
MONO: 17 % — ABNORMAL HIGH (ref 0–14)
PLT EST: DECREASED
SEG: 1 % — ABNORMAL LOW (ref 38–77)

## 2016-08-05 LAB — PREPARE PLATELET PHERESIS: UNIT DIVISION: 0

## 2016-08-05 LAB — PREPARE RBC (CROSSMATCH)

## 2016-08-05 MED ORDER — DIPHENHYDRAMINE HCL 25 MG PO CAPS
25.0000 mg | ORAL_CAPSULE | Freq: Once | ORAL | Status: AC
  Administered 2016-08-05: 25 mg via ORAL

## 2016-08-05 MED ORDER — AMINOCAPROIC ACID 500 MG PO TABS: 500.0000 mg | ORAL_TABLET | Freq: Three times a day (TID) | ORAL | 1 refills | Status: DC

## 2016-08-05 MED ORDER — SODIUM CHLORIDE 0.9 % IV SOLN
250.0000 mL | Freq: Once | INTRAVENOUS | Status: AC
  Administered 2016-08-05: 250 mL via INTRAVENOUS

## 2016-08-05 MED ORDER — ACETAMINOPHEN 325 MG PO TABS
650.0000 mg | ORAL_TABLET | Freq: Once | ORAL | Status: AC
  Administered 2016-08-05: 650 mg via ORAL

## 2016-08-05 NOTE — Patient Instructions (Signed)
Thrombocytopenia Thrombocytopenia is a condition in which you have an abnormally decreased number of platelets in your blood. Platelets are also called thrombocytes. Platelets are needed for blood clotting. Some cases of thrombocytopenia are mild while others are more severe. What are the causes? This condition may be caused by:  Decreased production of platelets. This can be caused by:  Aplastic anemia, in which your bone marrow quits making blood cells.  Cancer in the bone marrow.  Use of certain medicines, including chemotherapy.  Infection in the bone marrow.  Heavy alcohol consumption.  Increased destruction of platelets. This can be caused by:  Certain immune diseases.  Use of certain drugs.  Certain blood clotting disorders.  Certain inherited disorders.  Certain bleeding disorders.  Pregnancy.  Having an enlarged spleen (hypersplenism). In hypersplenism, the spleen gathers up platelets from circulation. This means that the platelets are not available to help with blood clotting. The spleen can be enlarged because of cirrhosis or other conditions. What are the signs or symptoms? Symptoms of this condition are side effects of poor blood clotting. They will vary depending on how low the platelet counts are. Symptoms may include:  Abnormal bleeding.  Nosebleeds.  Heavy menstrual periods.  Blood in the urine or stool (feces).  A purplish discoloration in the skin (purpura).  Bruising.  A rash that looks like pinpoint, purplish-red spots (petechiae) on the skin and mucous membranes. How is this diagnosed? This condition may be diagnosed with blood tests and a physical exam. Sometimes, a sample of bone marrow may be removed to look for the original cells (megakaryocytes) that make platelets. How is this treated? Treatment for this condition depends on the cause. Treatment options may include:  Treatment of another condition that is causing the low platelet  count.  Medicines to help protect your platelets from being destroyed.  A replacement (transfusion) of platelets to stop or prevent bleeding.  Surgery to remove the spleen. Follow these instructions at home: General instructions  Check your skin and the linings inside your mouth for bruising or bleeding as told by your health care provider.  Check your sputum, urine, and stool for blood as told by your health care provider.  Ask your health care provider if it is okay for you to drink alcohol.  Take over-the-counter and prescription medicines only as told by your health care provider.  Tell all of your health care providers, including dentists and eye doctors, about your condition. Activity  Until your health care provider says it is okay.  Do not return to any activities that could cause bumps or bruises.  Take extra care not to cut yourself when you shave or when you use scissors, needles, knives, and other tools.  Take extra care not to burn yourself when ironing or cooking. Contact a health care provider if:  You have unexplained bruising. Get help right away if:  You have active bleeding from anywhere on your body.  You have blood in your sputum, urine, or stool. This information is not intended to replace advice given to you by your health care provider. Make sure you discuss any questions you have with your health care provider. Document Released: 06/10/2005 Document Revised: 02/11/2016 Document Reviewed: 12/12/2014 Elsevier Interactive Patient Education  2017 Elsevier Inc.  

## 2016-08-05 NOTE — Progress Notes (Addendum)
[name]     ID: TIRZA SENTENO DOB: June 12, 1936  MR#: [id number]  WUX#:[id number]  Patient Care Team: [place], FNP as PCP - General (Family Medicine) PCP: [name], FNP, [name] GYN: SU:  OTHER MD: [name], [name], [name]  CHIEF COMPLAINT: Acute myeloid leukemia; ITP  CURRENT TREATMENT: lenalidomide, romiplostim  HISTORY OF PRESENT ILLNESS: From the 05/23/2015 consult note:  "The patient was evaluated at her PCP's office 05/22/2015 with a complaint of DOE worsening over several months. Labwork was obtained including a CBC, whioch showed WBC 1.8, platelets 14K, and Hb 5.9 with an MCV of 97.5. DDimer was 1.04. Accordingly the patient was referred to the ED and was admitted 05/23/2015.   Labwork since admission includes a repeat CBC with WBC 1.7, HB 5.4, MCV 100.6 and platelets 10K. Creat was 1.04 with GFR 50. Reticulocytes are not elevated with abs 39.3 (1.7%) and LDH is normal at 188. Other labs are pending.  The patient tells me because of peripheral neuropathy she has been receiving monthly B-12 shots since 2005."  Bone marrow biopsy 05/25/2015 showed acute myeloid leukemia, with 17% blasts by flow cytometry, 24% by aspirate counts, and 20-30% by immunohistochemistry ([id number], and [id number]). The blasts were positive for CD 117 and focally for MPO. There were also myelodysplasia related changes in all 3 cell lines.  The patient's subsequent history is as detailed below.  INTERVAL HISTORY: [name] is here today for follow up of her AML.  She is doing better since stopping lenalidomide at '10mg'$  per day.  She is concerned however due to the slowly increasing leukemic blasts in her peripheral blood.  She continues to take Valtrex and her mucositis is improved.  She is fatigued, and does endorse shortness of breath when walking distances such as down the hall in her house.  She has a poor appetite.  For instance today, she only  ate crackers to take with her meds before coming here today.  She did go and buy some Ensure for supplementation of her meals as she and I discussed last week.  She denies fevers, or chills.  She tells me that she is depressed, but no worse than usual for this time of year.  She says she is taking effexor and tolerating it well.  I discontinued her HCTZ last week and her blood pressure has improved, along with her dizziness.  She is also not having as many episodes of urge incontinence.  She also thinks it has improved her constipation.     REVIEW OF SYSTEMS:  A complete 14 point ROS was conducted and was negative except for what is noted above.   PAST MEDICAL HISTORY: Past Medical History:  Diagnosis Date  . Anemia   . Anxiety   . Arthritis    "hand joints; hips; back" (05/23/2015)  . Cancer ([place])    dec. 2016-leukemia  . Chronic lower back pain   . Depression   . GERD (gastroesophageal reflux disease)   . History of blood transfusion 05/23/2015   "Hgb 5.9"  . Hypertension   . Shortness of breath dyspnea    with exertion    PAST SURGICAL HISTORY: Past Surgical History:  Procedure Laterality Date  . Bone spur     R thigh  . CATARACT EXTRACTION W/ INTRAOCULAR LENS  IMPLANT, BILATERAL  ~ 2005  . DILATION AND CURETTAGE OF UTERUS    . ESOPHAGOGASTRODUODENOSCOPY (EGD) WITH PROPOFOL N/A 11/08/2014   Procedure: ESOPHAGOGASTRODUODENOSCOPY (  EGD) WITH PROPOFOL;  Surgeon: Garlan Fair, MD;  Location: [facility];  Service: Endoscopy;  Laterality: N/A;  . JOINT REPLACEMENT    . TONSILLECTOMY  ~ 09/11/1948  . TOTAL KNEE ARTHROPLASTY  04/27/2012   Procedure: TOTAL KNEE ARTHROPLASTY;  Surgeon: Mauri Pole, MD;  Location: [facility];  Service: Orthopedics;  Laterality: Right;  . TOTAL SHOULDER ARTHROPLASTY  09/13/2011   Procedure: TOTAL SHOULDER ARTHROPLASTY;  Surgeon: Augustin Schooling, MD;  Location: [facility];  Service: Orthopedics;  Laterality: Left;  LEFT SHOULDER REVERSED TOTAL SHOULDER  ARTHROPLASTY    FAMILY HISTORY Family History  Problem Relation Age of Onset  . Heart attack Mother 31    Died age 5  . Heart disease Brother     Atrial fib  Patient's father died age 71 with prostate cancer; mother died at 61 with an MI. One brother, alive at 31; one sister with parkinson's. No blood or cancer problems otherwise in family  GYNECOLOGIC HISTORY:  No LMP recorded. Patient is postmenopausal. Menarche age 23, first live birth age 56, she is GXP3; menopause age 34, no HR  SOCIAL HISTORY:  Grade school Pharmacist, hospital, retired; husband was a Higher education careers adviser. He passed away in the fall of 2016-09-11. Son, [name] works for [employer] and sings in the [employer]; daughter Kalihiwai lives in [place], a homemaker; daughter [name] works at the surgical center in [employer] as an Therapist, sports. The patient has 7 gch. She is a Psychologist, forensic.   ADVANCED DIRECTIVES: in place; the patient's son [name] is her [place]: Social History  Substance Use Topics  . Smoking status: Never Smoker  . Smokeless tobacco: Never Used  . Alcohol use No      Allergies  Allergen Reactions  . Cozaar [Losartan] Other (See Comments)    Cause pt to lose her taste for foods  . Codeine Itching and Other (See Comments)    Makes feel crazy  PT STATES SHE ALSO CAN NOT TAKE THE SYNTHETIC CODEINES  . Hydrocodone Itching    Tolerates with benadryl  . Oxycodone Itching  . Tetanus Toxoids Swelling and Rash    Current Outpatient Prescriptions  Medication Sig Dispense Refill  . aminocaproic acid (AMICAR) 500 MG tablet Take 1 tablet (500 mg total) by mouth 3 (three) times daily. 90 tablet 1  . amLODipine (NORVASC) 2.5 MG tablet TAKE ONE TABLET BY MOUTH ONCE DAILY 90 tablet 5  . ciprofloxacin (CIPRO) 500 MG tablet Take 1 tablet (500 mg total) by mouth 2 (two) times daily. 10 tablet 0  . Cyanocobalamin 1000 MCG/ML KIT Inject 1,000 mcg as directed every 30 (thirty) days. B 12 shot 10 kit 1  . magic mouthwash w/lidocaine SOLN  Take 5 mLs by mouth 4 (four) times daily as needed for mouth pain. Swish and spit 240 mL 0  . Melatonin 5 MG TABS Take 2.5 mg by mouth at bedtime.    . Multiple Vitamin (MULTIVITAMIN WITH MINERALS) TABS tablet Take 1 tablet by mouth daily.    . pantoprazole (PROTONIX) 40 MG tablet Take 1 tablet (40 mg total) by mouth every morning. 90 tablet 4  . valACYclovir (VALTREX) 1000 MG tablet Take 1 tablet (1,000 mg total) by mouth 2 (two) times daily. 60 tablet 0  . Venlafaxine HCl 150 MG TB24 TAKE ONE TABLET BY MOUTH ONCE DAILY 30 tablet 5  . Vitamin D, Ergocalciferol, (DRISDOL) 50000 units CAPS capsule Take 1 capsule (50,000 Units total) by mouth See admin instructions. Every  other week. 30 capsule 1   No current facility-administered medications for this visit.    Facility-Administered Medications Ordered in Other Visits  Medication Dose Route Frequency Provider Last Rate Last Dose  . romiPLOStim (NPLATE) injection 60 mcg  60 mcg Subcutaneous Once Lowella Dell, MD        OBJECTIVE: Older white woman Who appears stated age  Vitals:   08/05/16 1100  BP: (!) 147/44  Pulse: 88  Resp: 18  Temp: 97.7 F (36.5 C)     Body mass index is 21.56 kg/m.    ECOG FS:1 - Symptomatic but completely ambulatory  HEENT:  Sclerae anicteric.  Oropharynx clear and moist.  Small oral ulcer at right buccal mucosa that is eccymotic. No ulcerations or evidence of oropharyngeal candidiasis. Neck is supple.  NODES:  No cervical, supraclavicular, or axillary lymphadenopathy palpated.  BREAST EXAM:  Deferred. LUNGS:  Clear to auscultation bilaterally.  No wheezes or rhonchi. HEART:  Regular rate and rhythm. No murmur appreciated. ABDOMEN:  Soft, nontender.  Positive, normoactive bowel sounds. No organomegaly palpated. MSK:  No focal spinal tenderness to palpation. Full range of motion bilaterally in the upper extremities. EXTREMITIES:  No peripheral edema.   SKIN:  Clear with no obvious rashes or skin changes.  No nail dyscrasia. NEURO:  Nonfocal. Well oriented.  Appropriate affect.  LAB RESULTS:  CMP     Component Value Date/Time   NA 136 06/21/2016 1407   K 3.7 06/21/2016 1407   CL 103 02/29/2016 0820   CO2 28 06/21/2016 1407   GLUCOSE 88 06/21/2016 1407   BUN 14.1 06/21/2016 1407   CREATININE 0.8 06/21/2016 1407   CALCIUM 9.3 06/21/2016 1407   PROT 6.8 06/21/2016 1407   ALBUMIN 3.8 06/21/2016 1407   AST 18 06/21/2016 1407   ALT 15 06/21/2016 1407   ALKPHOS 96 06/21/2016 1407   BILITOT 0.59 06/21/2016 1407   GFRNONAA >60 02/29/2016 0820   GFRAA >60 02/29/2016 0820    INo results found for: SPEP, UPEP  Lab Results  Component Value Date   WBC 6.1 08/05/2016   NEUTROABS 0.1 (LL) 08/01/2016   HGB 7.7 (L) 08/05/2016   HCT 22.0 (L) 08/05/2016   MCV 100.5 08/05/2016   PLT 9 (LL) 08/05/2016      Chemistry      Component Value Date/Time   NA 136 06/21/2016 1407   K 3.7 06/21/2016 1407   CL 103 02/29/2016 0820   CO2 28 06/21/2016 1407   BUN 14.1 06/21/2016 1407   CREATININE 0.8 06/21/2016 1407      Component Value Date/Time   CALCIUM 9.3 06/21/2016 1407   ALKPHOS 96 06/21/2016 1407   AST 18 06/21/2016 1407   ALT 15 06/21/2016 1407   BILITOT 0.59 06/21/2016 1407       No results found for: LABCA2  No components found for: LABCA125  No results for input(s): INR in the last 168 hours.  Urinalysis    Component Value Date/Time   COLORURINE YELLOW 04/21/2012 0946   APPEARANCEUR CLOUDY (A) 04/21/2012 0946   LABSPEC 1.019 04/21/2012 0946   PHURINE 7.0 04/21/2012 0946   GLUCOSEU NEGATIVE 04/21/2012 0946   HGBUR NEGATIVE 04/21/2012 0946   BILIRUBINUR NEGATIVE 04/21/2012 0946   KETONESUR NEGATIVE 04/21/2012 0946   PROTEINUR NEGATIVE 04/21/2012 0946   UROBILINOGEN 1.0 04/21/2012 0946   NITRITE NEGATIVE 04/21/2012 0946   LEUKOCYTESUR NEGATIVE 04/21/2012 0946    STUDIES:AND MICROSCOPIC INFORMATION  No results found. .  ASSESSMENT: 81 y.o.  [place] woman with  acute myeloid leukemia diagnosed by bone marrow biopsy 05/25/2015, with the blast count between 17 and 30% depending on the method of determination.   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, with gain of 2 and 19  (b) considered allogenic transplant--not felt to be a candidate  (c) all transfusion products to be irradiated  (1) started sQ azacitidine 06/05/2015, receiving 7 doses every 28 day cycle  (a) repeat bone marrow biopsy 02/29/2016 showed a mildly hypercellular marrow with dyspoiesis, but no increase in blasts either histologically or by flow cytometry  (b) repeat bone marrow biopsy 06/03/2016 shows recurrent acute myeloid leukemia  (2) immune thrombocytopenic purpura  (a) s/p IVIG 07/06/2015 (1g/kg x 2d) with excellent response  (b) romiplostim first dose 07/07/2015, repeated weekly  (3) biopsy of an enlarged right axillary lymph node at [facility] 11/13/2015 was nondiagnostic.  (4) started lenalidomide at 10 mg daily beginning 06/18/2016 (3 weeks on, 1 week off)  (5) 08/05/2016 Lenalidomide '5mg'$  daily, palliative care consulted, patient now a DNR  PLAN:   [name], is feeling improved from when I saw her last week.  However, her blood counts continue to be concerning.  She has an increase of blasts in her peripheral blood, 14% last Thursday, and 49% today.  Her hemoglobin is 7.7 and her plt count is 9.  This was reviewed with her by myself and Dr. Jana Hakim.  Dr. Jana Hakim spoke with patient about code status and getting palliative care/hospice involved (See his addendum).  Mrs. Wake will receive 1 unit of blood today, 1 unit of platelets.  She will also start Amicar '500mg'$  po TID.  Joann did not want to stop treatment all together.  She will start Lenalidomide '5mg'$  per day.  I have notified Hinda Lenis RN, who will get this arranged throught [facility] program.  I did call the patient's daughter, [name] at the end of the appointment and spoke with her about her mom  and the situation in detail.  [name] is planning on leaving work early and coming to the cancer center to help drive her mom home.  I have offered to meet with her when she comes over this afternoon as well.  I informed her that she needs to be prepared that her mother may perhaps pass away in a matter of a couple of weeks.   I spent 60 minutes with patient and greater than 50% was spent counseling patient face to face and coordinating care.     Charlestine Massed, NP [facility] [phone]  Addendum: I did meet with the patient's daughters, and son (one daughter through skype) and updated them on the plan.  Her daughter in [place] is planning on traveling over later this week with her children and staying for all of next week to spend time with her mom. I informed them of Joann's appointment on Monday with Dr. Jana Hakim so they can possibly attend.    Charlestine Massed, NP   08/05/2016 1:25 PM    ADDENDUM: I discussed Ellamay's situation with her extensively. She was not able to tolerate the earlier dose of lenalidomide and had significant side effects from it area did I suggested that at this point almost anything we try is more likely to cause her problems then saw them and that it may even shorten her life instead of prolonging it. I suggested a hospice referral and starting on a comfort care/palliative support mode.  She tells me she wants to  see her grandchildren go up. Unfortunately I think we're dealing with weeks not months and certainly not years and difficult as it was to discuss this I did let her know that her prognosis is brief.  She requested we contact her daughter and let her know that. Nevertheless Whitnie continues to be interested in treatment and I don't think that is irrational. Perhaps she will be able to tolerate a lower dose of lenalidomide. We are going to go with 5 mg daily. She will take this 3 weeks on and one-week off.  We continue to need to support red cells and  platelets. She will receive those today. I am also starting her on low-dose Amicar 500 mg 3 times a day, which I think she will be able to tolerate and perhaps will decrease her need for platelet transfusions and her bleeding risk.  We are placing a referral to in home palliative care so that if on when the crash comes the family will already be connected. I believe that she will need to be and become placed dorsally and since most of her family is not in town and they do all work full-time. She lives alone  I also gave her an out of facility DO NOT RESUSCITATE order. She understands that if she does not have this in hand if her family calls 911 they will be obligated to do full resuscitation. With this in hand she has the choice of not undergoing that in case of a terminal event.  We're going to continue to see Aryiana on a weekly basis. She knows to call for any additional problems that may develop in between visits.  I personally saw this patient and performed a substantive portion of this encounter with the listed APP documented above.   [name], MD Medical Oncology and Hematology [facility] [address] [place], [place] [address] Tel. [phone]    Fax. [phone]

## 2016-08-05 NOTE — Telephone Encounter (Signed)
This RN faxed referral to [facility] for referral per MD request.

## 2016-08-06 LAB — PREPARE PLATELET PHERESIS
Blood Product Expiration Date: 201802131606
ISSUE DATE / TIME: 201802121721
UNIT TYPE AND RH: 5100

## 2016-08-06 LAB — TYPE AND SCREEN
Blood Product Expiration Date: 201803082359
ISSUE DATE / TIME: 201802121524
Unit Type and Rh: 5100

## 2016-08-07 ENCOUNTER — Telehealth: Payer: Self-pay | Admitting: Oncology

## 2016-08-07 ENCOUNTER — Other Ambulatory Visit: Payer: Self-pay | Admitting: *Deleted

## 2016-08-07 MED ORDER — LENALIDOMIDE 5 MG PO CAPS: 5.0000 mg | ORAL_CAPSULE | Freq: Every day | ORAL | 0 refills | Status: DC

## 2016-08-07 NOTE — Telephone Encounter (Signed)
received FMLA Forms from patient for daughter [name] to be completed

## 2016-08-09 ENCOUNTER — Telehealth: Payer: Self-pay | Admitting: Oncology

## 2016-08-09 NOTE — Telephone Encounter (Signed)
Faxed FMLA papers for daughter Derry Skill to Foye Clock at [facility] fax [phone]

## 2016-08-10 ENCOUNTER — Other Ambulatory Visit: Payer: Self-pay | Admitting: Oncology

## 2016-08-12 ENCOUNTER — Other Ambulatory Visit: Payer: Self-pay | Admitting: Oncology

## 2016-08-12 ENCOUNTER — Ambulatory Visit (HOSPITAL_BASED_OUTPATIENT_CLINIC_OR_DEPARTMENT_OTHER): Payer: Medicare Other

## 2016-08-12 ENCOUNTER — Ambulatory Visit (HOSPITAL_BASED_OUTPATIENT_CLINIC_OR_DEPARTMENT_OTHER): Payer: Medicare Other | Admitting: Adult Health

## 2016-08-12 ENCOUNTER — Other Ambulatory Visit (HOSPITAL_BASED_OUTPATIENT_CLINIC_OR_DEPARTMENT_OTHER): Payer: Medicare Other

## 2016-08-12 ENCOUNTER — Ambulatory Visit: Payer: Medicare Other

## 2016-08-12 VITALS — BP 136/55 | HR 73 | Temp 98.8°F | Resp 18

## 2016-08-12 VITALS — BP 159/57 | HR 86 | Temp 97.9°F | Resp 18 | Ht 68.0 in | Wt 140.7 lb

## 2016-08-12 DIAGNOSIS — C9202 Acute myeloblastic leukemia, in relapse: Secondary | ICD-10-CM

## 2016-08-12 DIAGNOSIS — D709 Neutropenia, unspecified: Secondary | ICD-10-CM

## 2016-08-12 DIAGNOSIS — D693 Immune thrombocytopenic purpura: Secondary | ICD-10-CM | POA: Diagnosis not present

## 2016-08-12 DIAGNOSIS — C9200 Acute myeloblastic leukemia, not having achieved remission: Secondary | ICD-10-CM

## 2016-08-12 DIAGNOSIS — G629 Polyneuropathy, unspecified: Secondary | ICD-10-CM

## 2016-08-12 DIAGNOSIS — D61818 Other pancytopenia: Secondary | ICD-10-CM

## 2016-08-12 DIAGNOSIS — D696 Thrombocytopenia, unspecified: Secondary | ICD-10-CM

## 2016-08-12 DIAGNOSIS — C9201 Acute myeloblastic leukemia, in remission: Secondary | ICD-10-CM

## 2016-08-12 LAB — MANUAL DIFFERENTIAL
ALC: 1.1 10*3/uL (ref 0.9–3.3)
ANC (CHCC MAN DIFF): 0 10*3/uL — AB (ref 1.5–6.5)
BLASTS: 25 % — AB (ref 0–0)
Band Neutrophils: 0 % (ref 0–10)
Basophil: 0 % (ref 0–2)
EOS: 0 % (ref 0–7)
LYMPH: 71 % — ABNORMAL HIGH (ref 14–49)
MONO: 4 % (ref 0–14)
Metamyelocytes: 0 % (ref 0–0)
Myelocytes: 0 % (ref 0–0)
Other Cell: 0 % (ref 0–0)
PLT EST: DECREASED
PROMYELO: 0 % (ref 0–0)
SEG: 0 % — AB (ref 38–77)
Variant Lymph: 0 % (ref 0–0)
nRBC: 1 % — ABNORMAL HIGH (ref 0–0)

## 2016-08-12 LAB — CBC WITH DIFFERENTIAL/PLATELET
HCT: 23.6 % — ABNORMAL LOW (ref 34.8–46.6)
HEMOGLOBIN: 8.1 g/dL — AB (ref 11.6–15.9)
MCH: 33.1 pg (ref 25.1–34.0)
MCHC: 34.3 g/dL (ref 31.5–36.0)
MCV: 96.3 fL (ref 79.5–101.0)
Platelets: 3 10*3/uL — CL (ref 145–400)
RBC: 2.45 10*6/uL — ABNORMAL LOW (ref 3.70–5.45)
RDW: 17.6 % — AB (ref 11.2–14.5)
WBC: 1.6 10*3/uL — ABNORMAL LOW (ref 3.9–10.3)

## 2016-08-12 MED ORDER — ACETAMINOPHEN 325 MG PO TABS
ORAL_TABLET | ORAL | Status: AC
  Filled 2016-08-12: qty 2

## 2016-08-12 MED ORDER — SODIUM CHLORIDE 0.9 % IV SOLN
250.0000 mL | Freq: Once | INTRAVENOUS | Status: AC
  Administered 2016-08-12: 250 mL via INTRAVENOUS

## 2016-08-12 MED ORDER — DIPHENHYDRAMINE HCL 25 MG PO CAPS
25.0000 mg | ORAL_CAPSULE | Freq: Once | ORAL | Status: AC
  Administered 2016-08-12: 25 mg via ORAL

## 2016-08-12 MED ORDER — ACETAMINOPHEN 325 MG PO TABS
650.0000 mg | ORAL_TABLET | Freq: Once | ORAL | Status: AC
  Administered 2016-08-12: 650 mg via ORAL

## 2016-08-12 MED ORDER — DIPHENHYDRAMINE HCL 25 MG PO CAPS
ORAL_CAPSULE | ORAL | Status: AC
  Filled 2016-08-12: qty 1

## 2016-08-12 MED ORDER — ROMIPLOSTIM INJECTION 500 MCG
10.0000 ug/kg | Freq: Once | SUBCUTANEOUS | Status: AC
  Administered 2016-08-12: 640 ug via SUBCUTANEOUS
  Filled 2016-08-12: qty 1

## 2016-08-12 NOTE — Progress Notes (Addendum)
[name]     ID: [name] DOB: Jul 23, 1935  MR#: [id number]  SWN#:[id number]  Patient Care Team: Golden Circle, FNP as PCP - General (Family Medicine) [name], NP as Nurse Practitioner (Hematology and Oncology) [name], MD as Consulting Physician (Oncology) PCP: [name], FNP, [name] GYN: SU:  OTHER MD: [name], [name], [name]  CHIEF COMPLAINT: Acute myeloid leukemia; ITP  CURRENT TREATMENT: lenalidomide, romiplostim  HISTORY OF PRESENT ILLNESS: From the 05/23/2015 consult note:  "The patient was evaluated at her PCP's office 05/22/2015 with a complaint of DOE worsening over several months. Labwork was obtained including a CBC, whioch showed WBC 1.8, platelets 14K, and Hb 5.9 with an MCV of 97.5. DDimer was 1.04. Accordingly the patient was referred to the ED and was admitted 05/23/2015.   Labwork since admission includes a repeat CBC with WBC 1.7, HB 5.4, MCV 100.6 and platelets 10K. Creat was 1.04 with GFR 50. Reticulocytes are not elevated with abs 39.3 (1.7%) and LDH is normal at 188. Other labs are pending.  The patient tells me because of peripheral neuropathy she has been receiving monthly B-12 shots since 2005."  Bone marrow biopsy 05/25/2015 showed acute myeloid leukemia, with 17% blasts by flow cytometry, 24% by aspirate counts, and 20-30% by immunohistochemistry ([id number], and [id number]). The blasts were positive for CD 117 and focally for MPO. There were also myelodysplasia related changes in all 3 cell lines.  The patient's subsequent history is as detailed below.  INTERVAL HISTORY: [name] is here today for evaluation of her AML.  She is accompanied by her daughter from [place].  [name] is feeling well.  She has a couple of mouth ulcerations, but these are not painful ulcerations as they were before.  She is slightly fatigued.  She is taking Valtrex, Amicar, and Lenalidomide '5mg'$  per day  without any difficulty.      REVIEW OF SYSTEMS: Brenly does have some swelling in her legs that has increased slowly since stopping her HCTZ a couple of weeks ago.  She denies fevers, chills, headaches, blurred vision, double vision, cough, chest pain, palpitations, shortness of breath, abdominal pain, nausea, vomiting, constipation, diarrhea, hematochezia, numbness, tingling, or any further concerns.     PAST MEDICAL HISTORY: Past Medical History:  Diagnosis Date  . Anemia   . Anxiety   . Arthritis    "hand joints; hips; back" (05/23/2015)  . Cancer ([place])    dec. 2016-leukemia  . Chronic lower back pain   . Depression   . GERD (gastroesophageal reflux disease)   . History of blood transfusion 05/23/2015   "Hgb 5.9"  . Hypertension   . Shortness of breath dyspnea    with exertion    PAST SURGICAL HISTORY: Past Surgical History:  Procedure Laterality Date  . Bone spur     R thigh  . CATARACT EXTRACTION W/ INTRAOCULAR LENS  IMPLANT, BILATERAL  ~ 2005  . DILATION AND CURETTAGE OF UTERUS    . ESOPHAGOGASTRODUODENOSCOPY (EGD) WITH PROPOFOL N/A 11/08/2014   Procedure: ESOPHAGOGASTRODUODENOSCOPY (EGD) WITH PROPOFOL;  Surgeon: Garlan Fair, MD;  Location: [facility];  Service: Endoscopy;  Laterality: N/A;  . JOINT REPLACEMENT    . TONSILLECTOMY  ~ 1950  . TOTAL KNEE ARTHROPLASTY  04/27/2012   Procedure: TOTAL KNEE ARTHROPLASTY;  Surgeon: Mauri Pole, MD;  Location: [facility];  Service: Orthopedics;  Laterality: Right;  . TOTAL SHOULDER ARTHROPLASTY  09/13/2011   Procedure: TOTAL  SHOULDER ARTHROPLASTY;  Surgeon: Augustin Schooling, MD;  Location: [facility];  Service: Orthopedics;  Laterality: Left;  LEFT SHOULDER REVERSED TOTAL SHOULDER ARTHROPLASTY    FAMILY HISTORY Family History  Problem Relation Age of Onset  . Heart attack Mother 60    Died age 76  . Heart disease Brother     Atrial fib  Patient's father died age 37 with prostate cancer; mother died at 32 with an MI. One  brother, alive at 77; one sister with parkinson's. No blood or cancer problems otherwise in family  GYNECOLOGIC HISTORY:  No LMP recorded. Patient is postmenopausal. Menarche age 41, first live birth age 38, she is GXP3; menopause age 46, no HR  SOCIAL HISTORY:  Grade school Pharmacist, hospital, retired; husband was a Higher education careers adviser. He passed away in the fall of 09/17/16. Son, [name] works for [employer] and sings in the [employer]; daughter [name] lives in [place], a homemaker; daughter [name] works at the surgical center in [employer] as an Therapist, sports. The patient has 7 gch. She is a Psychologist, forensic.   ADVANCED DIRECTIVES: in place; the patient's son [name] is her Rodey: Social History  Substance Use Topics  . Smoking status: Never Smoker  . Smokeless tobacco: Never Used  . Alcohol use No      Allergies  Allergen Reactions  . Cozaar [Losartan] Other (See Comments)    Cause pt to lose her taste for foods  . Codeine Itching and Other (See Comments)    Makes feel crazy  PT STATES SHE ALSO CAN NOT TAKE THE SYNTHETIC CODEINES  . Hydrocodone Itching    Tolerates with benadryl  . Oxycodone Itching  . Tetanus Toxoids Swelling and Rash    Current Outpatient Prescriptions  Medication Sig Dispense Refill  . aminocaproic acid (AMICAR) 500 MG tablet Take 1 tablet (500 mg total) by mouth 3 (three) times daily. 90 tablet 1  . amLODipine (NORVASC) 2.5 MG tablet TAKE ONE TABLET BY MOUTH ONCE DAILY 90 tablet 5  . ciprofloxacin (CIPRO) 500 MG tablet Take 1 tablet (500 mg total) by mouth 2 (two) times daily. 10 tablet 0  . Cyanocobalamin 1000 MCG/ML KIT Inject 1,000 mcg as directed every 30 (thirty) days. B 12 shot 10 kit 1  . lenalidomide (REVLIMID) 5 MG capsule Take 1 capsule (5 mg total) by mouth daily. 21 capsule 0  . magic mouthwash w/lidocaine SOLN Take 5 mLs by mouth 4 (four) times daily as needed for mouth pain. Swish and spit 240 mL 0  . Melatonin 5 MG TABS Take 2.5 mg by mouth at bedtime.    .  Multiple Vitamin (MULTIVITAMIN WITH MINERALS) TABS tablet Take 1 tablet by mouth daily.    . pantoprazole (PROTONIX) 40 MG tablet Take 1 tablet (40 mg total) by mouth every morning. 90 tablet 4  . valACYclovir (VALTREX) 1000 MG tablet Take 1 tablet (1,000 mg total) by mouth 2 (two) times daily. 60 tablet 0  . Venlafaxine HCl 150 MG TB24 TAKE ONE TABLET BY MOUTH ONCE DAILY 30 tablet 5  . Vitamin D, Ergocalciferol, (DRISDOL) 50000 units CAPS capsule Take 1 capsule (50,000 Units total) by mouth See admin instructions. Every other week. 30 capsule 1   No current facility-administered medications for this visit.    Facility-Administered Medications Ordered in Other Visits  Medication Dose Route Frequency Provider Last Rate Last Dose  . romiPLOStim (NPLATE) injection 60 mcg  60 mcg Subcutaneous Once [name], MD  OBJECTIVE: Older white woman Who appears stated age  27:   08/12/16 1224  BP: (!) 159/57  Pulse: 86  Resp: 18  Temp: 97.9 F (36.6 C)     Body mass index is 21.39 kg/m.    ECOG FS:1 - Symptomatic but completely ambulatory GENERAL: Patient is a well appearing older female in no acute distress HEENT:  Sclerae anicteric.  Two 31m ulcerations noted in left buccal mucosa that are bruised, one 444mulceration on upper palate that is bruised. Neck is supple.  NODES:  No cervical, supraclavicular, or axillary lymphadenopathy palpated.  BREAST EXAM:  Deferred. LUNGS:  Clear to auscultation bilaterally.  No wheezes or rhonchi. HEART:  Regular rate and rhythm. No murmur appreciated. ABDOMEN:  Soft, nontender.  Positive, normoactive bowel sounds. No organomegaly palpated. MSK:  No focal spinal tenderness to palpation. Full range of motion bilaterally in the upper extremities. EXTREMITIES:  No peripheral edema.   SKIN:  Clear with no obvious rashes or skin changes. No nail dyscrasia. NEURO:  Nonfocal. Well oriented.  Appropriate affect.   LAB RESULTS:  CMP       Component Value Date/Time   NA 136 06/21/2016 1407   K 3.7 06/21/2016 1407   CL 103 02/29/2016 0820   CO2 28 06/21/2016 1407   GLUCOSE 88 06/21/2016 1407   BUN 14.1 06/21/2016 1407   CREATININE 0.8 06/21/2016 1407   CALCIUM 9.3 06/21/2016 1407   PROT 6.8 06/21/2016 1407   ALBUMIN 3.8 06/21/2016 1407   AST 18 06/21/2016 1407   ALT 15 06/21/2016 1407   ALKPHOS 96 06/21/2016 1407   BILITOT 0.59 06/21/2016 1407   GFRNONAA >60 02/29/2016 0820   GFRAA >60 02/29/2016 0820    INo results found for: SPEP, UPEP  Lab Results  Component Value Date   WBC 1.6 (L) 08/12/2016   NEUTROABS 0.1 (LL) 08/01/2016   HGB 8.1 (L) 08/12/2016   HCT 23.6 (L) 08/12/2016   MCV 96.3 08/12/2016   PLT 3 (LL) 08/12/2016      Chemistry      Component Value Date/Time   NA 136 06/21/2016 1407   K 3.7 06/21/2016 1407   CL 103 02/29/2016 0820   CO2 28 06/21/2016 1407   BUN 14.1 06/21/2016 1407   CREATININE 0.8 06/21/2016 1407      Component Value Date/Time   CALCIUM 9.3 06/21/2016 1407   ALKPHOS 96 06/21/2016 1407   AST 18 06/21/2016 1407   ALT 15 06/21/2016 1407   BILITOT 0.59 06/21/2016 1407       No results found for: LABCA2  No components found for: LABCA125  No results for input(s): INR in the last 168 hours.  Urinalysis    Component Value Date/Time   COLORURINE YELLOW 04/21/2012 0946   APPEARANCEUR CLOUDY (A) 04/21/2012 0946   LABSPEC 1.019 04/21/2012 0946   PHURINE 7.0 04/21/2012 0946   GLUCOSEU NEGATIVE 04/21/2012 0946   HGBUR NEGATIVE 04/21/2012 0946   BILIRUBINUR NEGATIVE 04/21/2012 0946   KETONESUR NEGATIVE 04/21/2012 0946   PROTEINUR NEGATIVE 04/21/2012 0946   UROBILINOGEN 1.0 04/21/2012 0946   NITRITE NEGATIVE 04/21/2012 0946   LEUKOCYTESUR NEGATIVE 04/21/2012 0946    STUDIES:AND MICROSCOPIC INFORMATION  No results found. .  ASSESSMENT: [age].o. [place] woman with acute myeloid leukemia diagnosed by bone marrow biopsy 05/25/2015, with the blast count between  17 and 30% depending on the method of determination.   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, with gain  of 2 and 19  (b) considered allogenic transplant--not felt to be a candidate  (c) all transfusion products to be irradiated  (1) started sQ azacitidine 06/05/2015, receiving 7 doses every 28 day cycle  (a) repeat bone marrow biopsy 02/29/2016 showed a mildly hypercellular marrow with dyspoiesis, but no increase in blasts either histologically or by flow cytometry  (b) repeat bone marrow biopsy 06/03/2016 shows recurrent acute myeloid leukemia  (2) immune thrombocytopenic purpura  (a) s/p IVIG 07/06/2015 (1g/kg x 2d) with excellent response  (b) romiplostim first dose 07/07/2015, repeated weekly  (3) biopsy of an enlarged right axillary lymph node at [facility] 11/13/2015 was nondiagnostic.  (4) started lenalidomide at 10 mg daily beginning 06/18/2016 (3 weeks on, 1 week off)  (5) 08/05/2016 Lenalidomide '5mg'$  daily, palliative care consulted, patient now a DNR  PLAN:  [name] is feeling well today.  She will continue Lenalidomide '5mg'$  per day as she is tolerating it well.  She will need to receive 1 pack of platelets today for a platelet count of 3.  She will also receive Romiplostim.  Her labs were reviewed with her in detail and she was given a copy of them.  She continues to be neutropenic and good hand washing was recommended.  Neutropenic precautions were reviewed.  She is due to meet with in home palliative care this week and has a signed golden DNR order.  She will return in one week for labs, an appointment with Dr. Jana Hakim, and possible transfusion.  Dr. Jana Hakim met with the patient also today.     Charlestine Massed, NP [place] [phone]   ADDENDUM: [place] is tolerating the lower dose of lenalidomide w/o event. There is so far no evidence of response but this may lag. She continues with severe pancytopenia but manages to maintain a good  functional status and so far bleeding and infection have not been a problem. We are accordingly continuing with the current plan.  She will be visited by [place] today.  I was delighted to see her daughter from [place] visit; the patient's sone (her HCPOA) also participated via facetime. They are aware Sitara had an out of facility DNR at home and know how to use it appropriately.  She will see me again next week. The plan for now continues to be to support as needed and switch to full Hospice in future as appropriate  I personally saw this patient and performed a substantive portion of this encounter with the listed APP documented above.   [name], MD Medical Oncology and Hematology [facility] [address] [place], [place] [address] Tel. [phone]    Fax. [phone]

## 2016-08-12 NOTE — Patient Instructions (Signed)
Platelet Transfusion Introduction A platelet transfusion is a procedure in which you receive donated platelets through an IV tube. Platelets are tiny pieces of blood cells. When a blood vessel is damaged, platelets collect in the damaged area to help form a blood clot. This begins the healing process. If your platelet count gets too low, your blood may have trouble clotting. You may need a platelet transfusion if you have a condition that causes a low number of platelets (thrombocytopenia). A platelet transfusion may be used to stop or prevent bleeding. Tell a health care provider about:  Any allergies you have.  All medicines you are taking, including vitamins, herbs, eye drops, creams, and over-the-counter medicines.  Any problems you or family members have had with anesthetic medicines.  Any blood disorders you have.  Any surgeries you have had.  Any medical conditions you have.  Any reactions you have had during a previous transfusion. What are the risks? Generally, this is a safe procedure. However, problems may occur, including:  Fever with or without chills. The fever usually occurs within the first 4 hours of the transfusion and returns to normal within 48 hours.  Allergic reaction. The reaction is most commonly caused by antibodies your body creates against substances in the transfusion. Signs of an allergic reaction may include itching, hives, difficulty breathing, shock, or low blood pressure.  Sudden (acute) or delayed hemolytic reaction. This rare reaction can occur during the transfusion and up to 28 days after the transfusion. The reaction usually occurs when your body's defense system (immune system) attacks the new platelets. Signs of a hemolytic reaction may include fever, headache, difficulty breathing, low blood pressure, a rapid heartbeat, or pain in your back, abdomen, chest, or IV site.  Transfusion-related acute lung injury (TRALI). TRALI can occur within hours of  a transfusion, or several days later. This is a rare reaction that causes lung damage. The cause is not known.  Infection. Signs of this rare complication may include fever, chills, vomiting, a rapid heartbeat, or low blood pressure. What happens before the procedure?  You may have a blood test to determine your blood type. This is necessary to find out what kind ofplatelets best matches your platelets.  If you have had an allergic reaction to a transfusion in the past, you may be given medicine to help prevent a reaction. Take this medicine only as directed by your health care provider.  Your temperature, blood pressure, and pulse will be monitored before the transfusion. What happens during the procedure?  An IV will be started in your hand or arm.  The transfusion will be attached to your IV tubing. The bag of donated platelets will be attached to your IV tube andgiven into your vein.  Your temperature, blood pressure, and pulse will be monitored regularly during the transfusion. This monitoring is done to help detect early signs of a transfusion reaction.  If you have any signs or symptoms of a reaction, your transfusion will be stopped and you may be given medicine.  When your transfusion is complete, your IV will be removed.  Pressure may be applied to the IV site for a few minutes.  A bandage (dressing) will be applied. The procedure may vary among health care providers and hospitals. What happens after the procedure?  Your blood pressure, temperature, and pulse will be monitored regularly. This information is not intended to replace advice given to you by your health care provider. Make sure you discuss any questions you have  with your health care provider. Document Released: 04/07/2007 Document Revised: 11/16/2015 Document Reviewed: 04/20/2014  2017 Elsevier

## 2016-08-13 LAB — PREPARE PLATELET PHERESIS
BLOOD PRODUCT EXPIRATION DATE: 201802212359
ISSUE DATE / TIME: 201802191630
UNIT TYPE AND RH: 7300

## 2016-08-15 ENCOUNTER — Other Ambulatory Visit: Payer: Self-pay | Admitting: Adult Health

## 2016-08-15 DIAGNOSIS — K1230 Oral mucositis (ulcerative), unspecified: Secondary | ICD-10-CM

## 2016-08-15 MED ORDER — MAGIC MOUTHWASH W/LIDOCAINE: 5.0000 mL | Freq: Four times a day (QID) | ORAL | 0 refills | Status: DC | PRN

## 2016-08-19 ENCOUNTER — Other Ambulatory Visit (HOSPITAL_BASED_OUTPATIENT_CLINIC_OR_DEPARTMENT_OTHER): Payer: Medicare Other

## 2016-08-19 ENCOUNTER — Ambulatory Visit (HOSPITAL_BASED_OUTPATIENT_CLINIC_OR_DEPARTMENT_OTHER): Payer: Medicare Other

## 2016-08-19 ENCOUNTER — Other Ambulatory Visit: Payer: Self-pay | Admitting: *Deleted

## 2016-08-19 ENCOUNTER — Ambulatory Visit (HOSPITAL_BASED_OUTPATIENT_CLINIC_OR_DEPARTMENT_OTHER): Payer: Medicare Other | Admitting: Oncology

## 2016-08-19 VITALS — BP 127/56 | HR 84 | Temp 98.1°F | Resp 17 | Ht 68.0 in | Wt 139.2 lb

## 2016-08-19 DIAGNOSIS — D693 Immune thrombocytopenic purpura: Secondary | ICD-10-CM

## 2016-08-19 DIAGNOSIS — C9200 Acute myeloblastic leukemia, not having achieved remission: Secondary | ICD-10-CM

## 2016-08-19 DIAGNOSIS — D649 Anemia, unspecified: Secondary | ICD-10-CM

## 2016-08-19 DIAGNOSIS — D61818 Other pancytopenia: Secondary | ICD-10-CM

## 2016-08-19 DIAGNOSIS — C9202 Acute myeloblastic leukemia, in relapse: Secondary | ICD-10-CM

## 2016-08-19 DIAGNOSIS — C9201 Acute myeloblastic leukemia, in remission: Secondary | ICD-10-CM

## 2016-08-19 LAB — MANUAL DIFFERENTIAL
ALC: 1.3 10*3/uL (ref 0.9–3.3)
ANC (CHCC MAN DIFF): 0 10*3/uL — AB (ref 1.5–6.5)
BAND NEUTROPHILS: 0 % (ref 0–10)
BASOPHIL: 0 % (ref 0–2)
BLASTS: 26 % — AB (ref 0–0)
EOS%: 1 % (ref 0–7)
LYMPH: 47 % (ref 14–49)
METAMYELOCYTES PCT: 0 % (ref 0–0)
MONO: 26 % — ABNORMAL HIGH (ref 0–14)
MYELOCYTES: 0 % (ref 0–0)
OTHER CELL: 0 % (ref 0–0)
PLT EST: DECREASED
PROMYELO: 0 % (ref 0–0)
SEG: 0 % — ABNORMAL LOW (ref 38–77)
VARIANT LYMPH: 0 % (ref 0–0)
nRBC: 0 % (ref 0–0)

## 2016-08-19 LAB — PREPARE RBC (CROSSMATCH)

## 2016-08-19 LAB — CBC WITH DIFFERENTIAL/PLATELET
HCT: 20.6 % — ABNORMAL LOW (ref 34.8–46.6)
HGB: 7.1 g/dL — ABNORMAL LOW (ref 11.6–15.9)
MCH: 33.2 pg (ref 25.1–34.0)
MCHC: 34.2 g/dL (ref 31.5–36.0)
MCV: 96.9 fL (ref 79.5–101.0)
Platelets: 5 10*3/uL — CL (ref 145–400)
RBC: 2.13 10*6/uL — ABNORMAL LOW (ref 3.70–5.45)
RDW: 18.9 % — AB (ref 11.2–14.5)
WBC: 2.8 10*3/uL — ABNORMAL LOW (ref 3.9–10.3)

## 2016-08-19 MED ORDER — ACETAMINOPHEN 325 MG PO TABS
ORAL_TABLET | ORAL | Status: AC
  Filled 2016-08-19: qty 2

## 2016-08-19 MED ORDER — SODIUM CHLORIDE 0.9% FLUSH
10.0000 mL | INTRAVENOUS | Status: DC | PRN
  Filled 2016-08-19: qty 10

## 2016-08-19 MED ORDER — DIPHENHYDRAMINE HCL 25 MG PO CAPS
ORAL_CAPSULE | ORAL | Status: AC
  Filled 2016-08-19: qty 1

## 2016-08-19 MED ORDER — DIPHENHYDRAMINE HCL 25 MG PO CAPS
25.0000 mg | ORAL_CAPSULE | Freq: Once | ORAL | Status: AC
  Administered 2016-08-19: 25 mg via ORAL

## 2016-08-19 MED ORDER — ACETAMINOPHEN 325 MG PO TABS
650.0000 mg | ORAL_TABLET | Freq: Once | ORAL | Status: AC
  Administered 2016-08-19: 650 mg via ORAL

## 2016-08-19 NOTE — Progress Notes (Signed)
[place]     ID: [name] DOB: 01-13-1936  MR#: [id number]  NFA#:[id number]  Patient Care Team: Alisha Circle, FNP as PCP - General (Family Medicine) Alisha Headland, NP as Nurse Practitioner (Hematology and Oncology) Alisha Cruel, MD as Consulting Physician (Oncology) PCP: [name], [place], [name] GYN: SU:  OTHER MD: [name], [name], [name]  CHIEF COMPLAINT: Acute myeloid leukemia; ITP  CURRENT TREATMENT: lenalidomide  HISTORY OF PRESENT ILLNESS: From the 05/23/2015 consult note:  "The patient was evaluated at her PCP's office 05/22/2015 with a complaint of DOE worsening over several months. Labwork was obtained including a CBC, whioch showed WBC 1.8, platelets 14K, and Hb 5.9 with an MCV of 97.5. DDimer was 1.04. Accordingly the patient was referred to the ED and was admitted 05/23/2015.   Labwork since admission includes a repeat CBC with WBC 1.7, HB 5.4, MCV 100.6 and platelets 10K. Creat was 1.04 with GFR 50. Reticulocytes are not elevated with abs 39.3 (1.7%) and LDH is normal at 188. Other labs are pending.  The patient tells me because of peripheral neuropathy she has been receiving monthly B-12 shots since 2005."  Bone marrow biopsy 05/25/2015 showed acute myeloid leukemia, with 17% blasts by flow cytometry, 24% by aspirate counts, and 20-30% by immunohistochemistry ([id number], and [id number]). The blasts were positive for CD 117 and focally for MPO. There were also myelodysplasia related changes in all 3 cell lines.  The patient's subsequent history is as detailed below.  INTERVAL HISTORY: [name] is here today for evaluation of her AM accompanied by he daughter [name]. Daughter [name] participated via facetime. Urine tells me that she is feeling "good" and that she had a very good and active weekend. However she is in a wheelchair today. She tells me while in the lobby she felt a bit lightheaded.  She  denies fever or bleeding problems. She is tolerating the Amicar with no nausea problems. She is also on Valtrex and is using Magic mouthwash as well. She has a small sore in the right cheek area she says. She says her appetite is a little bit down even though her sense of taste is not altered. She is as eating less but in the evening in particular she will drink a glass of skin milk. That settled her stomach. She says bowel movements are firm but not hard.     She was contacted by a in home palliative care but she was dizzy and they will reschedule that appointment   REVIEW OF SYSTEMS: [name] denies pain. There have been no unusual headaches, sudden visual changes, or rash. She has a little bit of sinus problems, and has a cough secondary to postnasal drip. She denies shortness of breath or pleurisy. There has been no dysuria or hematuria. She has mild ankle swelling bilaterally, which improves with Lasix. A detailed review of systems today was otherwise stable   PAST MEDICAL HISTORY: Past Medical History:  Diagnosis Date  . Anemia   . Anxiety   . Arthritis    "hand joints; hips; back" (05/23/2015)  . Cancer ([place])    dec. 2016-leukemia  . Chronic lower back pain   . Depression   . GERD (gastroesophageal reflux disease)   . History of blood transfusion 05/23/2015   "Hgb 5.9"  . Hypertension   . Shortness of breath dyspnea    with exertion    PAST SURGICAL HISTORY: Past Surgical History:  Procedure Laterality Date  .  Bone spur     R thigh  . CATARACT EXTRACTION W/ INTRAOCULAR LENS  IMPLANT, BILATERAL  ~ 08-31-2003  . DILATION AND CURETTAGE OF UTERUS    . ESOPHAGOGASTRODUODENOSCOPY (EGD) WITH PROPOFOL N/A 11/08/2014   Procedure: ESOPHAGOGASTRODUODENOSCOPY (EGD) WITH PROPOFOL;  Surgeon: Alisha Fair, MD;  Location: [facility];  Service: Endoscopy;  Laterality: N/A;  . JOINT REPLACEMENT    . TONSILLECTOMY  ~ 1948-08-30  . TOTAL KNEE ARTHROPLASTY  04/27/2012   Procedure: TOTAL KNEE  ARTHROPLASTY;  Surgeon: Alisha Pole, MD;  Location: [facility];  Service: Orthopedics;  Laterality: Right;  . TOTAL SHOULDER ARTHROPLASTY  09/13/2011   Procedure: TOTAL SHOULDER ARTHROPLASTY;  Surgeon: Alisha Schooling, MD;  Location: [facility];  Service: Orthopedics;  Laterality: Left;  LEFT SHOULDER REVERSED TOTAL SHOULDER ARTHROPLASTY    FAMILY HISTORY Family History  Problem Relation Age of Onset  . Heart attack Mother 67    Died age 28  . Heart disease Brother     Atrial fib  Patient's father died age 28 with prostate cancer; mother died at 64 with an MI. One brother, alive at 36; one sister with parkinson's. No blood or cancer problems otherwise in family  GYNECOLOGIC HISTORY:  No LMP recorded. Patient is postmenopausal. Menarche age 68, first live birth age 47, she is GXP3; menopause age 31, no HR  SOCIAL HISTORY:  Grade school Pharmacist, hospital, retired; husband was a Higher education careers adviser. He passed away in the fall of 08/30/16. Son, [name] works for [employer] and sings in the [employer]; daughter [name] lives in [place], a homemaker; daughter [name] works at the surgical center in [employer] as an Therapist, sports. The patient has 7 gch. She is a Psychologist, forensic.   ADVANCED DIRECTIVES: in place; the patient's son [name] is her [place]: Social History  Substance Use Topics  . Smoking status: Never Smoker  . Smokeless tobacco: Never Used  . Alcohol use No      Allergies  Allergen Reactions  . Cozaar [Losartan] Other (See Comments)    Cause pt to lose her taste for foods  . Codeine Itching and Other (See Comments)    Makes feel crazy  PT STATES SHE ALSO CAN NOT TAKE THE SYNTHETIC CODEINES  . Hydrocodone Itching    Tolerates with benadryl  . Oxycodone Itching  . Tetanus Toxoids Swelling and Rash    Current Outpatient Prescriptions  Medication Sig Dispense Refill  . aminocaproic acid (AMICAR) 500 MG tablet Take 1 tablet (500 mg total) by mouth 3 (three) times daily. 90 tablet 1  . ciprofloxacin  (CIPRO) 500 MG tablet Take 1 tablet (500 mg total) by mouth 2 (two) times daily. 10 tablet 0  . lenalidomide (REVLIMID) 5 MG capsule Take 1 capsule (5 mg total) by mouth daily. 21 capsule 0  . magic mouthwash w/lidocaine SOLN Take 5 mLs by mouth 4 (four) times daily as needed for mouth pain. Swish and spit 240 mL 0  . Melatonin 5 MG TABS Take 2.5 mg by mouth at bedtime.    . Multiple Vitamin (MULTIVITAMIN WITH MINERALS) TABS tablet Take 1 tablet by mouth daily.    . pantoprazole (PROTONIX) 40 MG tablet Take 1 tablet (40 mg total) by mouth every morning. 90 tablet 4  . valACYclovir (VALTREX) 1000 MG tablet Take 1 tablet (1,000 mg total) by mouth 2 (two) times daily. 60 tablet 0  . Venlafaxine HCl 150 MG TB24 TAKE ONE TABLET BY MOUTH ONCE DAILY 30 tablet  5  . Vitamin D, Ergocalciferol, (DRISDOL) 50000 units CAPS capsule Take 1 capsule (50,000 Units total) by mouth See admin instructions. Every other week. 30 capsule 1   No current facility-administered medications for this visit.    Facility-Administered Medications Ordered in Other Visits  Medication Dose Route Frequency Provider Last Rate Last Dose  . sodium chloride flush (NS) 0.9 % injection 10 mL  10 mL Intracatheter PRN Lowella Dell, MD        OBJECTIVE: Older white woman Examined in a wheelchair  Vitals:   08/19/16 1310  BP: (!) 127/56  Pulse: 84  Resp: 17  Temp: 98.1 F (36.7 C)     Body mass index is 21.17 kg/m.    ECOG FS:1 - Symptomatic but completely ambulatory   Sclerae unicteric, EOMs intact Oropharynx moist, no thrush, no obvious ulcer in the right cheek area No cervical or supraclavicular adenopathy Lungs no rales or rhonchi Heart regular rate and rhythm Abd soft, nontender, positive bowel sounds MSK no focal spinal tenderness, no joint edema Neuro: nonfocal, well oriented, appropriate affect Breasts: Deferred    LAB RESULTS:  CMP     Component Value Date/Time   NA 136 06/21/2016 1407   K 3.7  06/21/2016 1407   CL 103 02/29/2016 0820   CO2 28 06/21/2016 1407   GLUCOSE 88 06/21/2016 1407   BUN 14.1 06/21/2016 1407   CREATININE 0.8 06/21/2016 1407   CALCIUM 9.3 06/21/2016 1407   PROT 6.8 06/21/2016 1407   ALBUMIN 3.8 06/21/2016 1407   AST 18 06/21/2016 1407   ALT 15 06/21/2016 1407   ALKPHOS 96 06/21/2016 1407   BILITOT 0.59 06/21/2016 1407   GFRNONAA >60 02/29/2016 0820   GFRAA >60 02/29/2016 0820    INo results found for: SPEP, UPEP  Lab Results  Component Value Date   WBC 2.8 (L) 08/19/2016   NEUTROABS 0.1 (LL) 08/01/2016   HGB 7.1 (L) 08/19/2016   HCT 20.6 (L) 08/19/2016   MCV 96.9 08/19/2016   PLT 5 (LL) 08/19/2016      Chemistry      Component Value Date/Time   NA 136 06/21/2016 1407   K 3.7 06/21/2016 1407   CL 103 02/29/2016 0820   CO2 28 06/21/2016 1407   BUN 14.1 06/21/2016 1407   CREATININE 0.8 06/21/2016 1407      Component Value Date/Time   CALCIUM 9.3 06/21/2016 1407   ALKPHOS 96 06/21/2016 1407   AST 18 06/21/2016 1407   ALT 15 06/21/2016 1407   BILITOT 0.59 06/21/2016 1407     Approximately 20-25% blasts in the white cell series, with a total white cell count of less than 3000 and ANC 0.00   No results found for: LABCA2  No components found for: OITGP498  No results for input(s): INR in the last 168 hours.  Urinalysis    Component Value Date/Time   COLORURINE YELLOW 04/21/2012 0946   APPEARANCEUR CLOUDY (A) 04/21/2012 0946   LABSPEC 1.019 04/21/2012 0946   PHURINE 7.0 04/21/2012 0946   GLUCOSEU NEGATIVE 04/21/2012 0946   HGBUR NEGATIVE 04/21/2012 0946   BILIRUBINUR NEGATIVE 04/21/2012 0946   KETONESUR NEGATIVE 04/21/2012 0946   PROTEINUR NEGATIVE 04/21/2012 0946   UROBILINOGEN 1.0 04/21/2012 0946   NITRITE NEGATIVE 04/21/2012 0946   LEUKOCYTESUR NEGATIVE 04/21/2012 0946    STUDIES:AND MICROSCOPIC INFORMATION  No results found. .  ASSESSMENT: 81 y.o. [place] woman with acute myeloid leukemia diagnosed by bone  marrow biopsy 05/25/2015, with the blast count  between 17 and 30% depending on the method of determination.   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, with gain of 2 and 19  (b) considered allogenic transplant--not felt to be a candidate  (c) all transfusion products to be irradiated  (1) started sQ azacitidine 06/05/2015, receiving 7 doses every 28 day cycle  (a) repeat bone marrow biopsy 02/29/2016 showed a mildly hypercellular marrow with dyspoiesis, but no increase in blasts either histologically or by flow cytometry  (b) repeat bone marrow biopsy 06/03/2016 shows recurrent acute myeloid leukemia  (2) immune thrombocytopenic purpura  (a) s/p IVIG 07/06/2015 (1g/kg x 2d) with excellent response  (b) romiplostim first dose 07/07/2015, repeated weekly, discontinued after 08/12/2016 dose with no evidence of response  (3) biopsy of an enlarged right axillary lymph node at [facility] 11/13/2015 was nondiagnostic.  (4) started lenalidomide at 10 mg daily beginning 06/18/2016 (3 weeks on, 1 week off)-- poorly tolerated  (5) on 08/05/2016  restarted Lenalidomide  at '5mg'$  daily  (6)  in home palliative care consulted  (a) patient has a signed out of facility DO NOT RESUSCITATE order on hand at home   PLAN:  I spent approximately 30 minutes with [name] with most of that time spent discussing her complex problems.  She is tolerating the lower dose lenalidomide better. The total white cell count has decreased and the percentage of blasts also decreased some, although repeat today was stable. We still don't have any neutrophils.  We are discontinuing the romiplostim as there has been no response. We are continuing to support with blood transfusions and platelet transfusions today and we will continue to see her on a once week basis with labs, review of systems, physical exam, and continuation of the lenalidomide at current dose.   She has an appointment with Dr. Nadara Mustard at [place] 09/09/2016. Charidy will have been on lenalidomide about 3 months by the time of that visit so he should be possible to assess response and make a decision regarding changing treatment  or continuing treatment versus referring to full hospice.  Today I stopped her amlodipine. We are continuing the Lasix since she has problems with swollen ankles and she otherwise tolerates that medication well.  She and her family know to call for any problems that may develop before her return visit here which will be in a week     Alisha Cruel, MD Medical Oncology and Hematology [facility] [place], [place] [address] Tel. [phone]    Fax. [phone]

## 2016-08-19 NOTE — Patient Instructions (Addendum)
Blood Transfusion, Adult, Care After This sheet gives you information about how to care for yourself after your procedure. Your health care provider may also give you more specific instructions. If you have problems or questions, contact your health care provider. What can I expect after the procedure? After your procedure, it is common to have:  Bruising and soreness where the IV tube was inserted.  Headache. Follow these instructions at home:  Take over-the-counter and prescription medicines only as told by your health care provider.  Return to your normal activities as told by your health care provider.  Follow instructions from your health care provider about how to take care of your IV insertion site. Make sure you:  Wash your hands with soap and water before you change your bandage (dressing). If soap and water are not available, use hand sanitizer.  Change your dressing as told by your health care provider.  Check your IV insertion site every day for signs of infection. Check for:  More redness, swelling, or pain.  More fluid or blood.  Warmth.  Pus or a bad smell. Contact a health care provider if:  You have more redness, swelling, or pain around the IV insertion site.  You have more fluid or blood coming from the IV insertion site.  Your IV insertion site feels warm to the touch.  You have pus or a bad smell coming from the IV insertion site.  Your urine turns pink, red, or brown.  You feel weak after doing your normal activities. Get help right away if:  You have signs of a serious allergic or immune system reaction, including:  Itchiness.  Hives.  Trouble breathing.  Anxiety.  Chest or lower back pain.  Fever, flushing, and chills.  Rapid pulse.  Rash.  Diarrhea.  Vomiting.  Dark urine.  Serious headache.  Dizziness.  Stiff neck.  Yellow coloration of the face or the white parts of the eyes (jaundice). This information is not  intended to replace advice given to you by your health care provider. Make sure you discuss any questions you have with your health care provider. Document Released: 07/01/2014 Document Revised: 02/07/2016 Document Reviewed: 12/25/2015 Elsevier Interactive Patient Education  2017 [place].  Platelet Transfusion Introduction A platelet transfusion is a procedure in which you receive donated platelets through an IV tube. Platelets are tiny pieces of blood cells. When a blood vessel is damaged, platelets collect in the damaged area to help form a blood clot. This begins the healing process. If your platelet count gets too low, your blood may have trouble clotting. You may need a platelet transfusion if you have a condition that causes a low number of platelets (thrombocytopenia). A platelet transfusion may be used to stop or prevent bleeding. Tell a health care provider about:  Any allergies you have.  All medicines you are taking, including vitamins, herbs, eye drops, creams, and over-the-counter medicines.  Any problems you or family members have had with anesthetic medicines.  Any blood disorders you have.  Any surgeries you have had.  Any medical conditions you have.  Any reactions you have had during a previous transfusion. What are the risks? Generally, this is a safe procedure. However, problems may occur, including:  Fever with or without chills. The fever usually occurs within the first 4 hours of the transfusion and returns to normal within 48 hours.  Allergic reaction. The reaction is most commonly caused by antibodies your body creates against substances in the transfusion.  Signs of an allergic reaction may include itching, hives, difficulty breathing, shock, or low blood pressure.  Sudden (acute) or delayed hemolytic reaction. This rare reaction can occur during the transfusion and up to 28 days after the transfusion. The reaction usually occurs when your body's defense  system (immune system) attacks the new platelets. Signs of a hemolytic reaction may include fever, headache, difficulty breathing, low blood pressure, a rapid heartbeat, or pain in your back, abdomen, chest, or IV site.  Transfusion-related acute lung injury (TRALI). TRALI can occur within hours of a transfusion, or several days later. This is a rare reaction that causes lung damage. The cause is not known.  Infection. Signs of this rare complication may include fever, chills, vomiting, a rapid heartbeat, or low blood pressure. What happens before the procedure?  You may have a blood test to determine your blood type. This is necessary to find out what kind ofplatelets best matches your platelets.  If you have had an allergic reaction to a transfusion in the past, you may be given medicine to help prevent a reaction. Take this medicine only as directed by your health care provider.  Your temperature, blood pressure, and pulse will be monitored before the transfusion. What happens during the procedure?  An IV will be started in your hand or arm.  The transfusion will be attached to your IV tubing. The bag of donated platelets will be attached to your IV tube andgiven into your vein.  Your temperature, blood pressure, and pulse will be monitored regularly during the transfusion. This monitoring is done to help detect early signs of a transfusion reaction.  If you have any signs or symptoms of a reaction, your transfusion will be stopped and you may be given medicine.  When your transfusion is complete, your IV will be removed.  Pressure may be applied to the IV site for a few minutes.  A bandage (dressing) will be applied. The procedure may vary among health care providers and hospitals. What happens after the procedure?  Your blood pressure, temperature, and pulse will be monitored regularly. This information is not intended to replace advice given to you by your health care provider.  Make sure you discuss any questions you have with your health care provider. Document Released: 04/07/2007 Document Revised: 11/16/2015 Document Reviewed: 04/20/2014  2017 Elsevier     Thrombocytopenia Thrombocytopenia is a condition in which you have an abnormally decreased number of platelets in your blood. Platelets are also called thrombocytes. Platelets are needed for blood clotting. Some cases of thrombocytopenia are mild while others are more severe. What are the causes? This condition may be caused by:  Decreased production of platelets. This can be caused by:  Aplastic anemia, in which your bone marrow quits making blood cells.  Cancer in the bone marrow.  Use of certain medicines, including chemotherapy.  Infection in the bone marrow.  Heavy alcohol consumption.  Increased destruction of platelets. This can be caused by:  Certain immune diseases.  Use of certain drugs.  Certain blood clotting disorders.  Certain inherited disorders.  Certain bleeding disorders.  Pregnancy.  Having an enlarged spleen (hypersplenism). In hypersplenism, the spleen gathers up platelets from circulation. This means that the platelets are not available to help with blood clotting. The spleen can be enlarged because of cirrhosis or other conditions. What are the signs or symptoms? Symptoms of this condition are side effects of poor blood clotting. They will vary depending on how low the platelet counts  are. Symptoms may include:  Abnormal bleeding.  Nosebleeds.  Heavy menstrual periods.  Blood in the urine or stool (feces).  A purplish discoloration in the skin (purpura).  Bruising.  A rash that looks like pinpoint, purplish-red spots (petechiae) on the skin and mucous membranes. How is this diagnosed? This condition may be diagnosed with blood tests and a physical exam. Sometimes, a sample of bone marrow may be removed to look for the original cells (megakaryocytes) that  make platelets. How is this treated? Treatment for this condition depends on the cause. Treatment options may include:  Treatment of another condition that is causing the low platelet count.  Medicines to help protect your platelets from being destroyed.  A replacement (transfusion) of platelets to stop or prevent bleeding.  Surgery to remove the spleen. Follow these instructions at home: General instructions  Check your skin and the linings inside your mouth for bruising or bleeding as told by your health care provider.  Check your sputum, urine, and stool for blood as told by your health care provider.  Ask your health care provider if it is okay for you to drink alcohol.  Take over-the-counter and prescription medicines only as told by your health care provider.  Tell all of your health care providers, including dentists and eye doctors, about your condition. Activity  Until your health care provider says it is okay.  Do not return to any activities that could cause bumps or bruises.  Take extra care not to cut yourself when you shave or when you use scissors, needles, knives, and other tools.  Take extra care not to burn yourself when ironing or cooking. Contact a health care provider if:  You have unexplained bruising. Get help right away if:  You have active bleeding from anywhere on your body.  You have blood in your sputum, urine, or stool. This information is not intended to replace advice given to you by your health care provider. Make sure you discuss any questions you have with your health care provider. Document Released: 06/10/2005 Document Revised: 02/11/2016 Document Reviewed: 12/12/2014 Elsevier Interactive Patient Education  2017 [employer].

## 2016-08-20 ENCOUNTER — Telehealth: Payer: Self-pay | Admitting: Oncology

## 2016-08-20 LAB — PREPARE PLATELET PHERESIS
BLOOD PRODUCT EXPIRATION DATE: 201802282359
ISSUE DATE / TIME: 201802261505
UNIT TYPE AND RH: 7300

## 2016-08-20 NOTE — Telephone Encounter (Signed)
Called patient to inform her of next scheduled appointment for 2/28 at 11AM with [facility] for blood transfusion.

## 2016-08-21 ENCOUNTER — Inpatient Hospital Stay (HOSPITAL_COMMUNITY): Admission: RE | Admit: 2016-08-21 | Payer: Medicare Other | Source: Ambulatory Visit

## 2016-08-21 ENCOUNTER — Other Ambulatory Visit: Payer: Self-pay

## 2016-08-21 ENCOUNTER — Ambulatory Visit (HOSPITAL_BASED_OUTPATIENT_CLINIC_OR_DEPARTMENT_OTHER): Payer: Medicare Other

## 2016-08-21 ENCOUNTER — Telehealth: Payer: Self-pay | Admitting: Oncology

## 2016-08-21 ENCOUNTER — Other Ambulatory Visit: Payer: Self-pay | Admitting: Oncology

## 2016-08-21 DIAGNOSIS — C9200 Acute myeloblastic leukemia, not having achieved remission: Secondary | ICD-10-CM

## 2016-08-21 DIAGNOSIS — D469 Myelodysplastic syndrome, unspecified: Secondary | ICD-10-CM

## 2016-08-21 DIAGNOSIS — D693 Immune thrombocytopenic purpura: Secondary | ICD-10-CM

## 2016-08-21 DIAGNOSIS — C9202 Acute myeloblastic leukemia, in relapse: Secondary | ICD-10-CM

## 2016-08-21 DIAGNOSIS — C9201 Acute myeloblastic leukemia, in remission: Secondary | ICD-10-CM

## 2016-08-21 LAB — MANUAL DIFFERENTIAL
ALC: 1.6 10*3/uL (ref 0.9–3.3)
ANC (CHCC MAN DIFF): 0 10*3/uL — AB (ref 1.5–6.5)
BLASTS: 31 % — AB (ref 0–0)
Band Neutrophils: 0 % (ref 0–10)
Basophil: 0 % (ref 0–2)
EOS: 0 % (ref 0–7)
LYMPH: 67 % — AB (ref 14–49)
METAMYELOCYTES PCT: 0 % (ref 0–0)
MONO: 1 % (ref 0–14)
Myelocytes: 0 % (ref 0–0)
NRBC: 0 % (ref 0–0)
Other Cell: 0 % (ref 0–0)
PLT EST: DECREASED
PROMYELO: 0 % (ref 0–0)
SEG: 1 % — AB (ref 38–77)
Variant Lymph: 0 % (ref 0–0)

## 2016-08-21 LAB — CBC WITH DIFFERENTIAL/PLATELET
HCT: 23.1 % — ABNORMAL LOW (ref 34.8–46.6)
HEMOGLOBIN: 8.1 g/dL — AB (ref 11.6–15.9)
MCH: 32.1 pg (ref 25.1–34.0)
MCHC: 35.1 g/dL (ref 31.5–36.0)
MCV: 91.7 fL (ref 79.5–101.0)
Platelets: 31 10*3/uL — ABNORMAL LOW (ref 145–400)
RBC: 2.52 10*6/uL — AB (ref 3.70–5.45)
RDW: 17.2 % — AB (ref 11.2–14.5)
WBC: 2.4 10*3/uL — AB (ref 3.9–10.3)

## 2016-08-21 MED ORDER — ACETAMINOPHEN 325 MG PO TABS: 650.0000 mg | ORAL_TABLET | Freq: Once | ORAL | Status: DC

## 2016-08-21 MED ORDER — DIPHENHYDRAMINE HCL 25 MG PO CAPS
25.0000 mg | ORAL_CAPSULE | Freq: Once | ORAL | Status: DC
Start: 2016-08-21 — End: 2016-08-21

## 2016-08-21 MED ORDER — SODIUM CHLORIDE 0.9 % IV SOLN
250.0000 mL | Freq: Once | INTRAVENOUS | Status: DC
Start: 2016-08-21 — End: 2016-08-21

## 2016-08-21 NOTE — Telephone Encounter (Signed)
Patient called to see if she could have her appointment scheduled in at [facility] instead of at [facility]. She had anxiety about going to [facility]. Spoke with [employer] and infusion nurse to see if the patient could come to [facility] instead. Patient able to be added later in the afternoon.

## 2016-08-21 NOTE — Progress Notes (Signed)
Pt is in infusion today and states that she needs her 2nd unit of rbc transfusion. Pt received 1 unit of rbc/ 1 unit of platelets on Monday 2/26. Was unable to receive 2nd unit because of availability. Confirmed this with Dr.Magrinat and obtained order for CBC to evaluate the need for more blood transfusion today. Notified infusion RN and lab and is aware. Will follow up with results.

## 2016-08-21 NOTE — Progress Notes (Signed)
No blood products needed today per Dr Jana Hakim &  Pt to get IVIG on Fri.  Information given to pt on IVIG.

## 2016-08-22 ENCOUNTER — Ambulatory Visit (HOSPITAL_COMMUNITY)
Admission: RE | Admit: 2016-08-22 | Discharge: 2016-08-22 | Disposition: A | Discharge status: Home / Self Care | Payer: Medicare Other | Source: Ambulatory Visit | Attending: Oncology | Admitting: Oncology

## 2016-08-22 DIAGNOSIS — C9202 Acute myeloblastic leukemia, in relapse: Secondary | ICD-10-CM | POA: Insufficient documentation

## 2016-08-23 ENCOUNTER — Ambulatory Visit (HOSPITAL_BASED_OUTPATIENT_CLINIC_OR_DEPARTMENT_OTHER): Payer: Medicare Other

## 2016-08-23 VITALS — BP 153/72 | HR 76 | Temp 98.4°F | Resp 16

## 2016-08-23 DIAGNOSIS — C9202 Acute myeloblastic leukemia, in relapse: Secondary | ICD-10-CM

## 2016-08-23 DIAGNOSIS — D693 Immune thrombocytopenic purpura: Secondary | ICD-10-CM

## 2016-08-23 DIAGNOSIS — D696 Thrombocytopenia, unspecified: Secondary | ICD-10-CM

## 2016-08-23 DIAGNOSIS — D61818 Other pancytopenia: Secondary | ICD-10-CM

## 2016-08-23 DIAGNOSIS — K1230 Oral mucositis (ulcerative), unspecified: Secondary | ICD-10-CM

## 2016-08-23 LAB — TYPE AND SCREEN
ABO/RH(D): O POS
ANTIBODY SCREEN: NEGATIVE
UNIT DIVISION: 0
UNIT DIVISION: 0

## 2016-08-23 LAB — BPAM RBC
Blood Product Expiration Date: 201803212359
Blood Product Expiration Date: 201803262359
ISSUE DATE / TIME: 201802261500
UNIT TYPE AND RH: 5100
Unit Type and Rh: 5100

## 2016-08-23 MED ORDER — MAGIC MOUTHWASH W/LIDOCAINE
5.0000 mL | Freq: Four times a day (QID) | ORAL | 0 refills | Status: DC | PRN
Start: 2016-08-23 — End: 2016-09-05

## 2016-08-23 MED ORDER — ACETAMINOPHEN 325 MG PO TABS
ORAL_TABLET | ORAL | Status: AC
  Filled 2016-08-23: qty 1

## 2016-08-23 MED ORDER — ACETAMINOPHEN 325 MG PO TABS
ORAL_TABLET | ORAL | Status: AC
  Filled 2016-08-23: qty 2

## 2016-08-23 MED ORDER — DIPHENHYDRAMINE HCL 25 MG PO CAPS
25.0000 mg | ORAL_CAPSULE | Freq: Once | ORAL | Status: AC
  Administered 2016-08-23: 25 mg via ORAL

## 2016-08-23 MED ORDER — DIPHENHYDRAMINE HCL 25 MG PO CAPS
ORAL_CAPSULE | ORAL | Status: AC
  Filled 2016-08-23: qty 1

## 2016-08-23 MED ORDER — IMMUNE GLOBULIN (HUMAN) 20 GM/200ML IV SOLN
0.9500 g/kg | Freq: Once | INTRAVENOUS | Status: AC
  Administered 2016-08-23: 60 g via INTRAVENOUS
  Filled 2016-08-23: qty 600

## 2016-08-23 MED ORDER — ACETAMINOPHEN 325 MG PO TABS
650.0000 mg | ORAL_TABLET | Freq: Once | ORAL | Status: AC
Start: 2016-08-23 — End: 2016-08-23
  Administered 2016-08-23: 650 mg via ORAL

## 2016-08-23 NOTE — Progress Notes (Unsigned)
Patient reporting mouth sores and 8/10 pain; patient states mouth is on fire. Dr Jana Hakim aware and instructed patient to STOP Revlimid now (about 5 days early).

## 2016-08-26 ENCOUNTER — Ambulatory Visit (HOSPITAL_BASED_OUTPATIENT_CLINIC_OR_DEPARTMENT_OTHER): Payer: Medicare Other

## 2016-08-26 ENCOUNTER — Other Ambulatory Visit (HOSPITAL_BASED_OUTPATIENT_CLINIC_OR_DEPARTMENT_OTHER): Payer: Medicare Other

## 2016-08-26 ENCOUNTER — Other Ambulatory Visit: Payer: Self-pay | Admitting: *Deleted

## 2016-08-26 ENCOUNTER — Other Ambulatory Visit: Payer: Medicare Other

## 2016-08-26 ENCOUNTER — Ambulatory Visit (HOSPITAL_BASED_OUTPATIENT_CLINIC_OR_DEPARTMENT_OTHER): Payer: Medicare Other | Admitting: Adult Health

## 2016-08-26 ENCOUNTER — Ambulatory Visit: Payer: Medicare Other | Admitting: Adult Health

## 2016-08-26 VITALS — BP 112/52 | HR 86 | Temp 97.5°F | Resp 17 | Ht 68.0 in | Wt 138.6 lb

## 2016-08-26 DIAGNOSIS — D693 Immune thrombocytopenic purpura: Secondary | ICD-10-CM

## 2016-08-26 DIAGNOSIS — C9200 Acute myeloblastic leukemia, not having achieved remission: Secondary | ICD-10-CM

## 2016-08-26 DIAGNOSIS — C9202 Acute myeloblastic leukemia, in relapse: Secondary | ICD-10-CM

## 2016-08-26 DIAGNOSIS — K1231 Oral mucositis (ulcerative) due to antineoplastic therapy: Secondary | ICD-10-CM | POA: Diagnosis not present

## 2016-08-26 DIAGNOSIS — R42 Dizziness and giddiness: Secondary | ICD-10-CM | POA: Diagnosis not present

## 2016-08-26 DIAGNOSIS — G629 Polyneuropathy, unspecified: Secondary | ICD-10-CM

## 2016-08-26 DIAGNOSIS — D709 Neutropenia, unspecified: Secondary | ICD-10-CM

## 2016-08-26 DIAGNOSIS — D469 Myelodysplastic syndrome, unspecified: Secondary | ICD-10-CM

## 2016-08-26 LAB — CBC WITH DIFFERENTIAL/PLATELET
HEMATOCRIT: 20.6 % — AB (ref 34.8–46.6)
HEMOGLOBIN: 7.1 g/dL — AB (ref 11.6–15.9)
MCH: 31.4 pg (ref 25.1–34.0)
MCHC: 34.5 g/dL (ref 31.5–36.0)
MCV: 91.2 fL (ref 79.5–101.0)
Platelets: 2 10*3/uL — CL (ref 145–400)
RBC: 2.26 10*6/uL — ABNORMAL LOW (ref 3.70–5.45)
RDW: 16.5 % — ABNORMAL HIGH (ref 11.2–14.5)
WBC: 3.9 10*3/uL (ref 3.9–10.3)

## 2016-08-26 LAB — MANUAL DIFFERENTIAL
ALC: 2.8 10*3/uL (ref 0.9–3.3)
ANC (CHCC MAN DIFF): 0 10*3/uL — AB (ref 1.5–6.5)
BASOPHIL: 0 % (ref 0–2)
BLASTS: 23 % — AB (ref 0–0)
Band Neutrophils: 0 % (ref 0–10)
EOS%: 0 % (ref 0–7)
LYMPH: 73 % — ABNORMAL HIGH (ref 14–49)
METAMYELOCYTES PCT: 0 % (ref 0–0)
MONO: 4 % (ref 0–14)
Myelocytes: 0 % (ref 0–0)
OTHER CELL: 0 % (ref 0–0)
PLT EST: DECREASED
PROMYELO: 0 % (ref 0–0)
SEG: 0 % — AB (ref 38–77)
VARIANT LYMPH: 0 % (ref 0–0)
nRBC: 0 % (ref 0–0)

## 2016-08-26 LAB — PREPARE RBC (CROSSMATCH)

## 2016-08-26 MED ORDER — DIPHENHYDRAMINE HCL 25 MG PO CAPS
ORAL_CAPSULE | ORAL | Status: AC
  Filled 2016-08-26: qty 1

## 2016-08-26 MED ORDER — SODIUM CHLORIDE 0.9 % IV SOLN
Freq: Once | INTRAVENOUS | Status: AC
  Administered 2016-08-26: 13:00:00 via INTRAVENOUS

## 2016-08-26 MED ORDER — OXYCODONE-ACETAMINOPHEN 5-325 MG PO TABS
1.0000 | ORAL_TABLET | Freq: Once | ORAL | Status: AC
  Administered 2016-08-26: 1 via ORAL

## 2016-08-26 MED ORDER — TRAMADOL HCL 50 MG PO TABS: 25.0000 mg | ORAL_TABLET | Freq: Four times a day (QID) | ORAL | 0 refills | Status: DC | PRN

## 2016-08-26 MED ORDER — MAGIC MOUTHWASH W/LIDOCAINE
5.0000 mL | Freq: Four times a day (QID) | ORAL | Status: DC
  Administered 2016-08-26: 5 mL via ORAL
  Filled 2016-08-26: qty 5

## 2016-08-26 MED ORDER — ACETAMINOPHEN 325 MG PO TABS
ORAL_TABLET | ORAL | Status: AC
  Filled 2016-08-26: qty 2

## 2016-08-26 MED ORDER — MAGIC MOUTHWASH W/LIDOCAINE
5.0000 mL | Freq: Four times a day (QID) | ORAL | Status: DC
  Filled 2016-08-26: qty 5

## 2016-08-26 MED ORDER — ACETAMINOPHEN 325 MG PO TABS
650.0000 mg | ORAL_TABLET | Freq: Once | ORAL | Status: AC
  Administered 2016-08-26: 650 mg via ORAL

## 2016-08-26 MED ORDER — OXYCODONE-ACETAMINOPHEN 5-325 MG PO TABS
ORAL_TABLET | ORAL | Status: AC
  Filled 2016-08-26: qty 1

## 2016-08-26 MED ORDER — FLUCONAZOLE 200 MG PO TABS: 200.0000 mg | ORAL_TABLET | Freq: Every day | ORAL | 0 refills | Status: DC

## 2016-08-26 MED ORDER — SODIUM CHLORIDE 0.9 % IV SOLN
250.0000 mL | Freq: Once | INTRAVENOUS | Status: AC
  Administered 2016-08-26: 250 mL via INTRAVENOUS

## 2016-08-26 MED ORDER — DIPHENHYDRAMINE HCL 25 MG PO CAPS
25.0000 mg | ORAL_CAPSULE | Freq: Once | ORAL | Status: AC
  Administered 2016-08-26: 25 mg via ORAL

## 2016-08-26 NOTE — Patient Instructions (Addendum)
Blood Transfusion , Adult A blood transfusion is a procedure in which you receive donated blood, including plasma, platelets, and red blood cells, through an IV tube. You may need a blood transfusion because of illness, surgery, or injury. The blood may come from a donor. You may also be able to donate blood for yourself (autologous blood donation) before a surgery if you know that you might require a blood transfusion. The blood given in a transfusion is made up of different types of cells. You may receive:  Red blood cells. These carry oxygen to the cells in the body.  White blood cells. These help you fight infections.  Platelets. These help your blood to clot.  Plasma. This is the liquid part of your blood and it helps with fluid imbalances. If you have hemophilia or another clotting disorder, you may also receive other types of blood products. Tell a health care provider about:  Any allergies you have.  All medicines you are taking, including vitamins, herbs, eye drops, creams, and over-the-counter medicines.  Any problems you or family members have had with anesthetic medicines.  Any blood disorders you have.  Any surgeries you have had.  Any medical conditions you have, including any recent fever or cold symptoms.  Whether you are pregnant or may be pregnant.  Any previous reactions you have had during a blood transfusion. What are the risks? Generally, this is a safe procedure. However, problems may occur, including:  Having an allergic reaction to something in the donated blood. Hives and itching may be symptoms of this type of reaction.  Fever. This may be a reaction to the white blood cells in the transfused blood. Nausea or chest pain may accompany a fever.  Iron overload. This can happen from having many transfusions.  Transfusion-related acute lung injury (TRALI). This is a rare reaction that causes lung damage. The cause is not known.TRALI can occur within hours  of a transfusion or several days later.  Sudden (acute) or delayed hemolytic reactions. This happens if your blood does not match the cells in your transfusion. Your body's defense system (immune system) may try to attack the new cells. This complication is rare. The symptoms include fever, chills, nausea, and low back pain or chest pain.  Infection or disease transmission. This is rare. What happens before the procedure?  You will have a blood test to determine your blood type. This is necessary to know what kind of blood your body will accept and to match it to the donor blood.  If you are going to have a planned surgery, you may be able to do an autologous blood donation. This may be done in case you need to have a transfusion.  If you have had an allergic reaction to a transfusion in the past, you may be given medicine to help prevent a reaction. This medicine may be given to you by mouth or through an IV tube.  You will have your temperature, blood pressure, and pulse monitored before the transfusion.  Follow instructions from your health care provider about eating and drinking restrictions.  Ask your health care provider about:  Changing or stopping your regular medicines. This is especially important if you are taking diabetes medicines or blood thinners.  Taking medicines such as aspirin and ibuprofen. These medicines can thin your blood. Do not take these medicines before your procedure if your health care provider instructs you not to. What happens during the procedure?  An IV tube will be   inserted into one of your veins.  The bag of donated blood will be attached to your IV tube. The blood will then enter through your vein.  Your temperature, blood pressure, and pulse will be monitored regularly during the transfusion. This monitoring is done to detect early signs of a transfusion reaction.  If you have any signs or symptoms of a reaction, your transfusion will be stopped and  you may be given medicine.  When the transfusion is complete, your IV tube will be removed.  Pressure may be applied to the IV site for a few minutes.  A bandage (dressing) will be applied. The procedure may vary among health care providers and hospitals. What happens after the procedure?  Your temperature, blood pressure, heart rate, breathing rate, and blood oxygen level will be monitored often.  Your blood may be tested to see how you are responding to the transfusion.  You may be warmed with fluids or blankets to maintain a normal body temperature. Summary  A blood transfusion is a procedure in which you receive donated blood, including plasma, platelets, and red blood cells, through an IV tube.  Your temperature, blood pressure, and pulse will be monitored before, during, and after the transfusion.  Your blood may be tested after the transfusion to see how your body has responded. This information is not intended to replace advice given to you by your health care provider. Make sure you discuss any questions you have with your health care provider. Document Released: 06/07/2000 Document Revised: 03/07/2016 Document Reviewed: 03/07/2016 Elsevier Interactive Patient Education  2017 [employer].  Platelet Transfusion A platelet transfusion is a procedure in which you receive donated platelets through an IV tube. Platelets are tiny pieces of blood cells. When a blood vessel is damaged, platelets collect in the damaged area to help form a blood clot. This begins the healing process. If your platelet count gets too low, your blood may have trouble clotting. You may need a platelet transfusion if you have a condition that causes a low number of platelets (thrombocytopenia). A platelet transfusion may be used to stop or prevent bleeding. Tell a health care provider about:  Any allergies you have.  All medicines you are taking, including vitamins, herbs, eye drops, creams, and  over-the-counter medicines.  Any problems you or family members have had with anesthetic medicines.  Any blood disorders you have.  Any surgeries you have had.  Any medical conditions you have.  Any reactions you have had during a previous transfusion. What are the risks? Generally, this is a safe procedure. However, problems may occur, including:  Fever with or without chills. The fever usually occurs within the first 4 hours of the transfusion and returns to normal within 48 hours.  Allergic reaction. The reaction is most commonly caused by antibodies your body creates against substances in the transfusion. Signs of an allergic reaction may include itching, hives, difficulty breathing, shock, or low blood pressure.  Sudden (acute) or delayed hemolytic reaction. This rare reaction can occur during the transfusion and up to 28 days after the transfusion. The reaction usually occurs when your body's defense system (immune system) attacks the new platelets. Signs of a hemolytic reaction may include fever, headache, difficulty breathing, low blood pressure, a rapid heartbeat, or pain in your back, abdomen, chest, or IV site.  Transfusion-related acute lung injury (TRALI). TRALI can occur within hours of a transfusion, or several days later. This is a rare reaction that causes lung damage. The  cause is not known.  Infection. Signs of this rare complication may include fever, chills, vomiting, a rapid heartbeat, or low blood pressure. What happens before the procedure?  You may have a blood test to determine your blood type. This is necessary to find out what kind ofplatelets best matches your platelets.  If you have had an allergic reaction to a transfusion in the past, you may be given medicine to help prevent a reaction. Take this medicine only as directed by your health care provider.  Your temperature, blood pressure, and pulse will be monitored before the transfusion. What happens  during the procedure?  An IV will be started in your hand or arm.  The transfusion will be attached to your IV tubing. The bag of donated platelets will be attached to your IV tube andgiven into your vein.  Your temperature, blood pressure, and pulse will be monitored regularly during the transfusion. This monitoring is done to help detect early signs of a transfusion reaction.  If you have any signs or symptoms of a reaction, your transfusion will be stopped and you may be given medicine.  When your transfusion is complete, your IV will be removed.  Pressure may be applied to the IV site for a few minutes.  A bandage (dressing) will be applied. The procedure may vary among health care providers and hospitals. What happens after the procedure?  Your blood pressure, temperature, and pulse will be monitored regularly. This information is not intended to replace advice given to you by your health care provider. Make sure you discuss any questions you have with your health care provider. Document Released: 04/07/2007 Document Revised: 11/16/2015 Document Reviewed: 04/20/2014 Elsevier Interactive Patient Education  2017 [employer]   Dehydration, Adult Dehydration is a condition in which there is not enough fluid or water in the body. This happens when you lose more fluids than you take in. Important organs, such as the kidneys, brain, and heart, cannot function without a proper amount of fluids. Any loss of fluids from the body can lead to dehydration. Dehydration can range from mild to severe. This condition should be treated right away to prevent it from becoming severe. What are the causes? This condition may be caused by: Vomiting. Diarrhea. Excessive sweating, such as from heat exposure or exercise. Not drinking enough fluid, especially: When ill. While doing activity that requires a lot of energy. Excessive urination. Fever. Infection. Certain medicines, such as medicines  that cause the body to lose excess fluid (diuretics). Inability to access safe drinking water. Reduced physical ability to get adequate water and food. What increases the risk? This condition is more likely to develop in people: Who have a poorly controlled long-term (chronic) illness, such as diabetes, heart disease, or kidney disease. Who are age 69 or older. Who are disabled. Who live in a place with high altitude. Who play endurance sports. What are the signs or symptoms? Symptoms of mild dehydration may include:  Thirst. Dry lips. Slightly dry mouth. Dry, warm skin. Dizziness. Symptoms of moderate dehydration may include:  Very dry mouth. Muscle cramps. Dark urine. Urine may be the color of tea. Decreased urine production. Decreased tear production. Heartbeat that is irregular or faster than normal (palpitations). Headache. Light-headedness, especially when you stand up from a sitting position. Fainting (syncope). Symptoms of severe dehydration may include:  Changes in skin, such as: Cold and clammy skin. Blotchy (mottled) or pale skin. Skin that does not quickly return to normal after being lightly  pinched and released (poor skin turgor). Changes in body fluids, such as: Extreme thirst. No tear production. Inability to sweat when body temperature is high, such as in hot weather. Very little urine production. Changes in vital signs, such as: Weak pulse. Pulse that is more than 100 beats a minute when sitting still. Rapid breathing. Low blood pressure. Other changes, such as: Sunken eyes. Cold hands and feet. Confusion. Lack of energy (lethargy). Difficulty waking up from sleep. Short-term weight loss. Unconsciousness. How is this diagnosed? This condition is diagnosed based on your symptoms and a physical exam. Blood and urine tests may be done to help confirm the diagnosis. How is this treated? Treatment for this condition depends on the severity. Mild or  moderate dehydration can often be treated at home. Treatment should be started right away. Do not wait until dehydration becomes severe. Severe dehydration is an emergency and it needs to be treated in a hospital. Treatment for mild dehydration may include:  Drinking more fluids. Replacing salts and minerals in your blood (electrolytes) that you may have lost. Treatment for moderate dehydration may include:  Drinking an oral rehydration solution (ORS). This is a drink that helps you replace fluids and electrolytes (rehydrate). It can be found at pharmacies and retail stores. Treatment for severe dehydration may include:  Receiving fluids through an IV tube. Receiving an electrolyte solution through a feeding tube that is passed through your nose and into your stomach (nasogastric tube, or NG tube). Correcting any abnormalities in electrolytes. Treating the underlying cause of dehydration. Follow these instructions at home: If directed by your health care provider, drink an ORS: Make an ORS by following instructions on the package. Start by drinking small amounts, about  cup (120 mL) every 5-10 minutes. Slowly increase how much you drink until you have taken the amount recommended by your health care provider. Drink enough clear fluid to keep your urine clear or pale yellow. If you were told to drink an ORS, finish the ORS first, then start slowly drinking other clear fluids. Drink fluids such as: Water. Do not drink only water. Doing that can lead to having too little salt (sodium) in the body (hyponatremia). Ice chips. Fruit juice that you have added water to (diluted fruit juice). Low-calorie sports drinks. Avoid: Alcohol. Drinks that contain a lot of sugar. These include high-calorie sports drinks, fruit juice that is not diluted, and soda. Caffeine. Foods that are greasy or contain a lot of fat or sugar. Take over-the-counter and prescription medicines only as told by your health care  provider. Do not take sodium tablets. This can lead to having too much sodium in the body (hypernatremia). Eat foods that contain a healthy balance of electrolytes, such as bananas, oranges, potatoes, tomatoes, and spinach. Keep all follow-up visits as told by your health care provider. This is important. Contact a health care provider if: You have abdominal pain that: Gets worse. Stays in one area (localizes). You have a rash. You have a stiff neck. You are more irritable than usual. You are sleepier or more difficult to wake up than usual. You feel weak or dizzy. You feel very thirsty. You have urinated only a small amount of very dark urine over 6-8 hours. Get help right away if: You have symptoms of severe dehydration. You cannot drink fluids without vomiting. Your symptoms get worse with treatment. You have a fever. You have a severe headache. You have vomiting or diarrhea that: Gets worse. Does not go away. You  have blood or green matter (bile) in your vomit. You have blood in your stool. This may cause stool to look black and tarry. You have not urinated in 6-8 hours. You faint. Your heart rate while sitting still is over 100 beats a minute. You have trouble breathing. This information is not intended to replace advice given to you by your health care provider. Make sure you discuss any questions you have with your health care provider. Document Released: 06/10/2005 Document Revised: 01/05/2016 Document Reviewed: 08/04/2015 Elsevier Interactive Patient Education  2017 [employer].

## 2016-08-26 NOTE — Progress Notes (Signed)
[facility]     ID: [name] DOB: [birth year]  MR#: [id number]  YDX#:[id number]  Patient Care Team: Veryl Speak, FNP as PCP - General (Family Medicine) Illa Level, NP as Nurse Practitioner (Hematology and Oncology) Lowella Dell, MD as Consulting Physician (Oncology) PCP: Jeanine Luz, FNP, Cheryll Cockayne GYN: SU:  OTHER MD: Durene Romans, Danise Edge, Dianna Dorene Sorrow  CHIEF COMPLAINT: Acute myeloid leukemia; ITP  CURRENT TREATMENT: lenalidomide  HISTORY OF PRESENT ILLNESS: From the 05/23/2015 consult note:  "The patient was evaluated at her PCP's office 05/22/2015 with a complaint of DOE worsening over several months. Labwork was obtained including a CBC, whioch showed WBC 1.8, platelets 14K, and Hb 5.9 with an MCV of 97.5. DDimer was 1.04. Accordingly the patient was referred to the ED and was admitted 05/23/2015.   Labwork since admission includes a repeat CBC with WBC 1.7, HB 5.4, MCV 100.6 and platelets 10K. Creat was 1.04 with GFR 50. Reticulocytes are not elevated with abs 39.3 (1.7%) and LDH is normal at 188. Other labs are pending.  The patient tells me because of peripheral neuropathy she has been receiving monthly B-12 shots since 2005."  Bone marrow biopsy 05/25/2015 showed acute myeloid leukemia, with 17% blasts by flow cytometry, 24% by aspirate counts, and 20-30% by immunohistochemistry ([id number], and [id number]). The blasts were positive for CD 117 and focally for MPO. There were also myelodysplasia related changes in all 3 cell lines.  The patient's subsequent history is as detailed below.  INTERVAL HISTORY: [name] is here today for evaluation of her relapsed AML.  She is not feeling well today.  She has ulcerations in her mouth that are painful.  She has not picked up the magic mouthwash yet.  She has been taking valtrex as prescribed and also took Ibuprofen for the pain once.  She is light headed with position changes due to  decreased oral intake.    She doe shave some oral bleeding.     REVIEW OF SYSTEMS: [name] denies fevers, chills, nausea, vomiting, blood in her stool, headaches, numbness, tingling, rash.  A detailed ros was otherwise non contributory.   PAST MEDICAL HISTORY: Past Medical History:  Diagnosis Date  . Anemia   . Anxiety   . Arthritis    "hand joints; hips; back" (05/23/2015)  . Cancer (HCC)    dec. 2016-leukemia  . Chronic lower back pain   . Depression   . GERD (gastroesophageal reflux disease)   . History of blood transfusion 05/23/2015   "Hgb 5.9"  . Hypertension   . Shortness of breath dyspnea    with exertion    PAST SURGICAL HISTORY: Past Surgical History:  Procedure Laterality Date  . Bone spur     R thigh  . CATARACT EXTRACTION W/ INTRAOCULAR LENS  IMPLANT, BILATERAL  ~ 2005  . DILATION AND CURETTAGE OF UTERUS    . ESOPHAGOGASTRODUODENOSCOPY (EGD) WITH PROPOFOL N/A 11/08/2014   Procedure: ESOPHAGOGASTRODUODENOSCOPY (EGD) WITH PROPOFOL;  Surgeon: Charolett Bumpers, MD;  Location: [facility];  Service: Endoscopy;  Laterality: N/A;  . JOINT REPLACEMENT    . TONSILLECTOMY  ~ 1950  . TOTAL KNEE ARTHROPLASTY  04/27/2012   Procedure: TOTAL KNEE ARTHROPLASTY;  Surgeon: Shelda Pal, MD;  Location: [facility];  Service: Orthopedics;  Laterality: Right;  . TOTAL SHOULDER ARTHROPLASTY  09/13/2011   Procedure: TOTAL SHOULDER ARTHROPLASTY;  Surgeon: Verlee Rossetti, MD;  [facility];  Service: Orthopedics;  Laterality: Left;  LEFT SHOULDER REVERSED TOTAL SHOULDER ARTHROPLASTY    FAMILY HISTORY Family History  Problem Relation Age of Onset  . Heart attack Mother 15    Died age 11  . Heart disease Brother     Atrial fib  Patient's father died age 73 with prostate cancer; mother died at 87 with an MI. One brother, alive at 103; one sister with parkinson's. No blood or cancer problems otherwise in family  GYNECOLOGIC HISTORY:  No LMP recorded. Patient is  postmenopausal. Menarche age 62, first live birth age 64, she is GXP3; menopause age 45, no HR  SOCIAL HISTORY:  Grade school Pharmacist, hospital, retired; husband was a Higher education careers adviser. He passed away in the fall of 2016-09-14. Son, [name] works for [employer] and sings in the [employer]; daughter [place] lives in [place], a homemaker; daughter [name] works at the surgical center in [employer] as an Therapist, sports. The patient has 7 gch. She is a Psychologist, forensic.   ADVANCED DIRECTIVES: in place; the patient's son [name] is her [place]: Social History  Substance Use Topics  . Smoking status: Never Smoker  . Smokeless tobacco: Never Used  . Alcohol use No      Allergies  Allergen Reactions  . Cozaar [Losartan] Other (See Comments)    Cause pt to lose her taste for foods  . Codeine Itching and Other (See Comments)    Makes feel crazy  PT STATES SHE ALSO CAN NOT TAKE THE SYNTHETIC CODEINES  . Hydrocodone Itching    Tolerates with benadryl  . Oxycodone Itching  . Tetanus Toxoids Swelling and Rash    Current Outpatient Prescriptions  Medication Sig Dispense Refill  . aminocaproic acid (AMICAR) 500 MG tablet Take 1 tablet (500 mg total) by mouth 3 (three) times daily. 90 tablet 1  . Melatonin 5 MG TABS Take 2.5 mg by mouth at bedtime.    . Multiple Vitamin (MULTIVITAMIN WITH MINERALS) TABS tablet Take 1 tablet by mouth daily.    . pantoprazole (PROTONIX) 40 MG tablet Take 1 tablet (40 mg total) by mouth every morning. 90 tablet 4  . valACYclovir (VALTREX) 1000 MG tablet Take 1 tablet (1,000 mg total) by mouth 2 (two) times daily. 60 tablet 0  . Venlafaxine HCl 150 MG TB24 TAKE ONE TABLET BY MOUTH ONCE DAILY 30 tablet 5  . Vitamin D, Ergocalciferol, (DRISDOL) 50000 units CAPS capsule Take 1 capsule (50,000 Units total) by mouth See admin instructions. Every other week. 30 capsule 1  . fluconazole (DIFLUCAN) 200 MG tablet Take 1 tablet (200 mg total) by mouth daily. 30 tablet 0  . lenalidomide (REVLIMID) 5 MG  capsule Take 1 capsule (5 mg total) by mouth daily. (Patient not taking: Reported on 08/26/2016) 21 capsule 0  . magic mouthwash w/lidocaine SOLN Take 5 mLs by mouth 4 (four) times daily as needed for mouth pain. Swish and spit (Patient not taking: Reported on 08/26/2016) 240 mL 0  . traMADol (ULTRAM) 50 MG tablet Take 0.5-1 tablets (25-50 mg total) by mouth every 6 (six) hours as needed. 30 tablet 0   Current Facility-Administered Medications  Medication Dose Route Frequency Provider Last Rate Last Dose  . magic mouthwash w/lidocaine  5 mL Oral QID [name], MD   5 mL at 08/26/16 1258   Facility-Administered Medications Ordered in Other Visits  Medication Dose Route Frequency Provider Last Rate Last Dose  . 0.9 %  sodium chloride infusion  250 mL Intravenous Once [name], NP  OBJECTIVE: Older white woman Examined in a wheelchair  Vitals:   08/26/16 1121  BP: (!) 112/52  Pulse: 86  Resp: 17  Temp: 97.5 F (36.4 C)     Body mass index is 21.07 kg/m.    ECOG FS:1 - Symptomatic but completely ambulatory   GENERAL: Patient is a chronically ill appearing woman HEENT:  Sclerae anicteric.  PERRL.  Multiple ulcerations throughout oral mucosa and along gum line.  Lower gum line is bleeding slightly.    NODES:  No cervical, supraclavicular, or axillary lymphadenopathy palpated.  BREAST EXAM:  Deferred. LUNGS:  Clear to auscultation bilaterally.  No wheezes or rhonchi. HEART:  Regular rate and rhythm. No murmur appreciated. ABDOMEN:  Soft, nontender.  Positive, normoactive bowel sounds. No organomegaly palpated. MSK:  No focal spinal tenderness to palpation.  EXTREMITIES:  No peripheral edema.   SKIN:  Clear with no obvious rashes or skin changes. No nail dyscrasia. NEURO:  Nonfocal. Well oriented.  Appropriate affect.      LAB RESULTS:  CMP     Component Value Date/Time   NA 136 06/21/2016 1407   K 3.7 06/21/2016 1407   CL 103 02/29/2016 0820   CO2 28  06/21/2016 1407   GLUCOSE 88 06/21/2016 1407   BUN 14.1 06/21/2016 1407   CREATININE 0.8 06/21/2016 1407   CALCIUM 9.3 06/21/2016 1407   PROT 6.8 06/21/2016 1407   ALBUMIN 3.8 06/21/2016 1407   AST 18 06/21/2016 1407   ALT 15 06/21/2016 1407   ALKPHOS 96 06/21/2016 1407   BILITOT 0.59 06/21/2016 1407   GFRNONAA >60 02/29/2016 0820   GFRAA >60 02/29/2016 0820    INo results found for: SPEP, UPEP  Lab Results  Component Value Date   WBC 3.9 08/26/2016   NEUTROABS 0.1 (LL) 08/01/2016   HGB 7.1 (L) 08/26/2016   HCT 20.6 (L) 08/26/2016   MCV 91.2 08/26/2016   PLT 2 (LL) 08/26/2016      Chemistry      Component Value Date/Time   NA 136 06/21/2016 1407   K 3.7 06/21/2016 1407   CL 103 02/29/2016 0820   CO2 28 06/21/2016 1407   BUN 14.1 06/21/2016 1407   CREATININE 0.8 06/21/2016 1407      Component Value Date/Time   CALCIUM 9.3 06/21/2016 1407   ALKPHOS 96 06/21/2016 1407   AST 18 06/21/2016 1407   ALT 15 06/21/2016 1407   BILITOT 0.59 06/21/2016 1407     Approximately 20-25% blasts in the white cell series, with a total white cell count of less than 3000 and ANC 0.00   No results found for: LABCA2  No components found for: LABCA125  No results for input(s): INR in the last 168 hours.  Urinalysis    Component Value Date/Time   COLORURINE YELLOW 04/21/2012 0946   APPEARANCEUR CLOUDY (A) 04/21/2012 0946   LABSPEC 1.019 04/21/2012 0946   PHURINE 7.0 04/21/2012 0946   GLUCOSEU NEGATIVE 04/21/2012 0946   HGBUR NEGATIVE 04/21/2012 0946   BILIRUBINUR NEGATIVE 04/21/2012 0946   KETONESUR NEGATIVE 04/21/2012 0946   PROTEINUR NEGATIVE 04/21/2012 0946   UROBILINOGEN 1.0 04/21/2012 0946   NITRITE NEGATIVE 04/21/2012 0946   LEUKOCYTESUR NEGATIVE 04/21/2012 0946    STUDIES:AND MICROSCOPIC INFORMATION  No results found. .  ASSESSMENT: 81 y.o.  woman with acute myeloid leukemia diagnosed by bone marrow biopsy 05/25/2015, with the blast count between 17  and 30% depending on the method of determination.   (a) Cytogenetics found 5q- but also  6q- and loss of 12,13,14 and 18, with gain of 2 and 19  (b) considered allogenic transplant--not felt to be a candidate  (c) all transfusion products to be irradiated  (1) started sQ azacitidine 06/05/2015, receiving 7 doses every 28 day cycle  (a) repeat bone marrow biopsy 02/29/2016 showed a mildly hypercellular marrow with dyspoiesis, but no increase in blasts either histologically or by flow cytometry  (b) repeat bone marrow biopsy 06/03/2016 shows recurrent acute myeloid leukemia  (2) immune thrombocytopenic purpura  (a) s/p IVIG 07/06/2015 (1g/kg x 2d) with excellent response  (b) romiplostim first dose 07/07/2015, repeated weekly, discontinued after 08/12/2016 dose with no evidence of response  (3) biopsy of an enlarged right axillary lymph node at [facility] 11/13/2015 was nondiagnostic.  (4) started lenalidomide at 10 mg daily beginning 06/18/2016 (3 weeks on, 1 week off)-- poorly tolerated  (5) on 08/05/2016  restarted Lenalidomide at '5mg'$  daily stopped last week of February 2018 with severe mucositis  (6)  in home palliative care consulted  (a) patient has a signed out of facility DO NOT RESUSCITATE order on hand at home   PLAN:  [name] is not feeling very well today.  She will continue off the Lenalidomide for now.  She will receive blood and platelets today.  She was prescribed magic mouthwash, fluconazole daily, along with Tramadol for the pain.  I recommended she rinse her mouth with salt water gargles after eating as well.  She is neutropenic and neutropenic precautions were again reviewed with her.  She will also receive IV fluids today before her transfusions.  Should she not improve, or start to feel worse, she and her family know to call.      Charlestine Massed, NP Medical Oncology and Hematology [facility] [address] [place], [place] [address] Tel. [phone]     Fax. [phone]   ADDENDUM: I met with [name] and her son today. I again said that it would not be unreasonable for someone in her situation to simply say she wants to be comfortable and not to have any further treatment. She is adamant however that she wants to push on with treatment if there is any chance of response at all.  Accordingly we are continuing maximal support. She will receive blood and platelets this week. She will receive fluids as needed. We are holding the lenalidomide until her mucositis completely resolves. At that point we will probably go to 5 mg every other day or even every third day until she runs out of her current supply. She will then go to 2.5 mg as tolerated, very likely 1 week on and one-week off.  She has a pending appointment with the transplant team at [place] 09/09/2016. I have urged the family members to accompany her there.  Today we are optimizing her pain control and increasing her local analgesics for her mucositis.  I personally saw this patient and performed a substantive portion of this encounter with the listed APP documented above.   [name], MD Medical Oncology and Hematology [facility] [address] [place], [place] [address] Tel. [phone]    Fax. [phone]

## 2016-08-27 ENCOUNTER — Telehealth: Payer: Self-pay | Admitting: *Deleted

## 2016-08-27 ENCOUNTER — Other Ambulatory Visit: Payer: Self-pay | Admitting: Oncology

## 2016-08-27 DIAGNOSIS — C9202 Acute myeloblastic leukemia, in relapse: Secondary | ICD-10-CM

## 2016-08-27 LAB — BPAM PLATELET PHERESIS
BLOOD PRODUCT EXPIRATION DATE: 201803062359
ISSUE DATE / TIME: 201803051433
UNIT TYPE AND RH: 6200

## 2016-08-27 LAB — PREPARE PLATELET PHERESIS: UNIT DIVISION: 0

## 2016-08-27 MED ORDER — FLUCONAZOLE 200 MG PO TABS: 200.0000 mg | ORAL_TABLET | Freq: Every day | ORAL | 0 refills | Status: DC

## 2016-08-27 NOTE — Telephone Encounter (Signed)
"  This is [name] with [facility].  We do not fill fluconazole orders.  Please send this order to a local pharmacy."   Order sent as ordered yesterday.

## 2016-08-28 ENCOUNTER — Encounter (HOSPITAL_COMMUNITY): Payer: Self-pay | Admitting: *Deleted

## 2016-08-28 ENCOUNTER — Emergency Department (HOSPITAL_COMMUNITY): Payer: Medicare Other

## 2016-08-28 ENCOUNTER — Other Ambulatory Visit: Payer: Self-pay | Admitting: Oncology

## 2016-08-28 ENCOUNTER — Inpatient Hospital Stay (HOSPITAL_COMMUNITY): Payer: Medicare Other

## 2016-08-28 ENCOUNTER — Telehealth: Payer: Self-pay | Admitting: Adult Health

## 2016-08-28 ENCOUNTER — Inpatient Hospital Stay (HOSPITAL_COMMUNITY)
Admission: EM | Admit: 2016-08-28 | Discharge: 2016-09-05 | DRG: 871 | Disposition: A | Discharge status: Hospice | Payer: Medicare Other | Attending: Internal Medicine | Admitting: Internal Medicine

## 2016-08-28 ENCOUNTER — Encounter: Payer: Self-pay | Admitting: Adult Health

## 2016-08-28 ENCOUNTER — Other Ambulatory Visit: Payer: Self-pay | Admitting: *Deleted

## 2016-08-28 ENCOUNTER — Other Ambulatory Visit: Payer: Self-pay

## 2016-08-28 ENCOUNTER — Telehealth: Payer: Self-pay | Admitting: *Deleted

## 2016-08-28 DIAGNOSIS — R4182 Altered mental status, unspecified: Secondary | ICD-10-CM

## 2016-08-28 DIAGNOSIS — Z885 Allergy status to narcotic agent status: Secondary | ICD-10-CM

## 2016-08-28 DIAGNOSIS — E860 Dehydration: Secondary | ICD-10-CM | POA: Diagnosis present

## 2016-08-28 DIAGNOSIS — Z8249 Family history of ischemic heart disease and other diseases of the circulatory system: Secondary | ICD-10-CM

## 2016-08-28 DIAGNOSIS — Z515 Encounter for palliative care: Secondary | ICD-10-CM | POA: Diagnosis present

## 2016-08-28 DIAGNOSIS — K219 Gastro-esophageal reflux disease without esophagitis: Secondary | ICD-10-CM | POA: Diagnosis present

## 2016-08-28 DIAGNOSIS — F329 Major depressive disorder, single episode, unspecified: Secondary | ICD-10-CM | POA: Diagnosis present

## 2016-08-28 DIAGNOSIS — Z961 Presence of intraocular lens: Secondary | ICD-10-CM | POA: Diagnosis present

## 2016-08-28 DIAGNOSIS — R319 Hematuria, unspecified: Secondary | ICD-10-CM | POA: Diagnosis present

## 2016-08-28 DIAGNOSIS — N183 Chronic kidney disease, stage 3 (moderate): Secondary | ICD-10-CM | POA: Diagnosis present

## 2016-08-28 DIAGNOSIS — R6520 Severe sepsis without septic shock: Secondary | ICD-10-CM | POA: Diagnosis not present

## 2016-08-28 DIAGNOSIS — D693 Immune thrombocytopenic purpura: Secondary | ICD-10-CM | POA: Diagnosis present

## 2016-08-28 DIAGNOSIS — I129 Hypertensive chronic kidney disease with stage 1 through stage 4 chronic kidney disease, or unspecified chronic kidney disease: Secondary | ICD-10-CM | POA: Diagnosis present

## 2016-08-28 DIAGNOSIS — M12819 Other specific arthropathies, not elsewhere classified, unspecified shoulder: Secondary | ICD-10-CM

## 2016-08-28 DIAGNOSIS — E872 Acidosis, unspecified: Secondary | ICD-10-CM

## 2016-08-28 DIAGNOSIS — A419 Sepsis, unspecified organism: Secondary | ICD-10-CM

## 2016-08-28 DIAGNOSIS — Y92012 Bathroom of single-family (private) house as the place of occurrence of the external cause: Secondary | ICD-10-CM

## 2016-08-28 DIAGNOSIS — R7989 Other specified abnormal findings of blood chemistry: Secondary | ICD-10-CM | POA: Diagnosis not present

## 2016-08-28 DIAGNOSIS — J189 Pneumonia, unspecified organism: Secondary | ICD-10-CM | POA: Diagnosis present

## 2016-08-28 DIAGNOSIS — W19XXXA Unspecified fall, initial encounter: Secondary | ICD-10-CM | POA: Diagnosis present

## 2016-08-28 DIAGNOSIS — R7881 Bacteremia: Secondary | ICD-10-CM | POA: Diagnosis not present

## 2016-08-28 DIAGNOSIS — Z888 Allergy status to other drugs, medicaments and biological substances status: Secondary | ICD-10-CM | POA: Diagnosis not present

## 2016-08-28 DIAGNOSIS — D649 Anemia, unspecified: Secondary | ICD-10-CM

## 2016-08-28 DIAGNOSIS — I471 Supraventricular tachycardia: Secondary | ICD-10-CM | POA: Diagnosis not present

## 2016-08-28 DIAGNOSIS — D61818 Other pancytopenia: Secondary | ICD-10-CM | POA: Diagnosis present

## 2016-08-28 DIAGNOSIS — A4159 Other Gram-negative sepsis: Secondary | ICD-10-CM

## 2016-08-28 DIAGNOSIS — Z66 Do not resuscitate: Secondary | ICD-10-CM | POA: Diagnosis present

## 2016-08-28 DIAGNOSIS — Z79899 Other long term (current) drug therapy: Secondary | ICD-10-CM

## 2016-08-28 DIAGNOSIS — N179 Acute kidney failure, unspecified: Secondary | ICD-10-CM | POA: Diagnosis not present

## 2016-08-28 DIAGNOSIS — Y92000 Kitchen of unspecified non-institutional (private) residence as  the place of occurrence of the external cause: Secondary | ICD-10-CM | POA: Diagnosis not present

## 2016-08-28 DIAGNOSIS — I21A1 Myocardial infarction type 2: Secondary | ICD-10-CM

## 2016-08-28 DIAGNOSIS — M75100 Unspecified rotator cuff tear or rupture of unspecified shoulder, not specified as traumatic: Secondary | ICD-10-CM

## 2016-08-28 DIAGNOSIS — G9341 Metabolic encephalopathy: Secondary | ICD-10-CM | POA: Diagnosis present

## 2016-08-28 DIAGNOSIS — B961 Klebsiella pneumoniae [K. pneumoniae] as the cause of diseases classified elsewhere: Secondary | ICD-10-CM | POA: Diagnosis present

## 2016-08-28 DIAGNOSIS — C9202 Acute myeloblastic leukemia, in relapse: Secondary | ICD-10-CM

## 2016-08-28 DIAGNOSIS — Y95 Nosocomial condition: Secondary | ICD-10-CM | POA: Diagnosis present

## 2016-08-28 DIAGNOSIS — K1230 Oral mucositis (ulcerative), unspecified: Secondary | ICD-10-CM | POA: Diagnosis present

## 2016-08-28 DIAGNOSIS — Z9842 Cataract extraction status, left eye: Secondary | ICD-10-CM | POA: Diagnosis not present

## 2016-08-28 DIAGNOSIS — I214 Non-ST elevation (NSTEMI) myocardial infarction: Secondary | ICD-10-CM | POA: Diagnosis not present

## 2016-08-28 DIAGNOSIS — G9340 Encephalopathy, unspecified: Secondary | ICD-10-CM

## 2016-08-28 DIAGNOSIS — Z96651 Presence of right artificial knee joint: Secondary | ICD-10-CM | POA: Diagnosis present

## 2016-08-28 DIAGNOSIS — R0609 Other forms of dyspnea: Secondary | ICD-10-CM

## 2016-08-28 DIAGNOSIS — Z887 Allergy status to serum and vaccine status: Secondary | ICD-10-CM

## 2016-08-28 DIAGNOSIS — R5081 Fever presenting with conditions classified elsewhere: Secondary | ICD-10-CM | POA: Diagnosis present

## 2016-08-28 DIAGNOSIS — Z7189 Other specified counseling: Secondary | ICD-10-CM | POA: Diagnosis not present

## 2016-08-28 DIAGNOSIS — C9200 Acute myeloblastic leukemia, not having achieved remission: Secondary | ICD-10-CM | POA: Diagnosis present

## 2016-08-28 DIAGNOSIS — Z96612 Presence of left artificial shoulder joint: Secondary | ICD-10-CM | POA: Diagnosis present

## 2016-08-28 DIAGNOSIS — A414 Sepsis due to anaerobes: Secondary | ICD-10-CM | POA: Diagnosis not present

## 2016-08-28 DIAGNOSIS — G9349 Other encephalopathy: Secondary | ICD-10-CM | POA: Diagnosis not present

## 2016-08-28 DIAGNOSIS — E43 Unspecified severe protein-calorie malnutrition: Secondary | ICD-10-CM | POA: Diagnosis present

## 2016-08-28 DIAGNOSIS — E876 Hypokalemia: Secondary | ICD-10-CM

## 2016-08-28 DIAGNOSIS — J9601 Acute respiratory failure with hypoxia: Secondary | ICD-10-CM | POA: Diagnosis present

## 2016-08-28 DIAGNOSIS — M47817 Spondylosis without myelopathy or radiculopathy, lumbosacral region: Secondary | ICD-10-CM

## 2016-08-28 DIAGNOSIS — Z9841 Cataract extraction status, right eye: Secondary | ICD-10-CM

## 2016-08-28 DIAGNOSIS — R530 Neoplastic (malignant) related fatigue: Secondary | ICD-10-CM

## 2016-08-28 DIAGNOSIS — W19XXXS Unspecified fall, sequela: Secondary | ICD-10-CM

## 2016-08-28 LAB — BPAM RBC
Blood Product Expiration Date: 201803212359
ISSUE DATE / TIME: 201803051428
Unit Type and Rh: 5100

## 2016-08-28 LAB — COMPREHENSIVE METABOLIC PANEL
ALT: 19 U/L (ref 14–54)
ALT: 19 U/L (ref 14–54)
ANION GAP: 9 (ref 5–15)
AST: 30 U/L (ref 15–41)
AST: 37 U/L (ref 15–41)
Albumin: 2.6 g/dL — ABNORMAL LOW (ref 3.5–5.0)
Albumin: 2.1 g/dL — ABNORMAL LOW (ref 3.5–5.0)
Alkaline Phosphatase: 122 U/L (ref 38–126)
Alkaline Phosphatase: 99 U/L (ref 38–126)
Anion gap: 10 (ref 5–15)
BILIRUBIN TOTAL: 1.2 mg/dL (ref 0.3–1.2)
BUN: 22 mg/dL — ABNORMAL HIGH (ref 6–20)
BUN: 22 mg/dL — ABNORMAL HIGH (ref 6–20)
CHLORIDE: 102 mmol/L (ref 101–111)
CO2: 23 mmol/L (ref 22–32)
CO2: 20 mmol/L — ABNORMAL LOW (ref 22–32)
CREATININE: 1.15 mg/dL — AB (ref 0.44–1.00)
Calcium: 8.2 mg/dL — ABNORMAL LOW (ref 8.9–10.3)
Calcium: 7.5 mg/dL — ABNORMAL LOW (ref 8.9–10.3)
Chloride: 101 mmol/L (ref 101–111)
Creatinine, Ser: 1.13 mg/dL — ABNORMAL HIGH (ref 0.44–1.00)
GFR, EST AFRICAN AMERICAN: 53 mL/min — AB (ref >60)
GFR, EST AFRICAN AMERICAN: 51 mL/min — AB (ref >60)
GFR, EST NON AFRICAN AMERICAN: 45 mL/min — AB (ref >60)
GFR, EST NON AFRICAN AMERICAN: 44 mL/min — AB (ref >60)
Glucose, Bld: 99 mg/dL (ref 65–99)
Glucose, Bld: 100 mg/dL — ABNORMAL HIGH (ref 65–99)
POTASSIUM: 3.1 mmol/L — AB (ref 3.5–5.1)
POTASSIUM: 3 mmol/L — AB (ref 3.5–5.1)
Sodium: 133 mmol/L — ABNORMAL LOW (ref 135–145)
Sodium: 132 mmol/L — ABNORMAL LOW (ref 135–145)
TOTAL PROTEIN: 7 g/dL (ref 6.5–8.1)
TOTAL PROTEIN: 6.5 g/dL (ref 6.5–8.1)
Total Bilirubin: 1.3 mg/dL — ABNORMAL HIGH (ref 0.3–1.2)

## 2016-08-28 LAB — BLOOD GAS, ARTERIAL
Acid-base deficit: 3.4 mmol/L — ABNORMAL HIGH (ref 0.0–2.0)
BICARBONATE: 19.2 mmol/L — AB (ref 20.0–28.0)
DRAWN BY: 232811
Delivery systems: POSITIVE
Expiratory PAP: 6
FIO2: 60
Inspiratory PAP: 14
Mode: POSITIVE
O2 SAT: 98.1 %
PATIENT TEMPERATURE: 102.2
PCO2 ART: 29.8 mmHg — AB (ref 32.0–48.0)
pH, Arterial: 7.437 (ref 7.350–7.450)
pO2, Arterial: 141 mmHg — ABNORMAL HIGH (ref 83.0–108.0)

## 2016-08-28 LAB — URINALYSIS, ROUTINE W REFLEX MICROSCOPIC
Bilirubin Urine: NEGATIVE
Glucose, UA: NEGATIVE mg/dL
Ketones, ur: NEGATIVE mg/dL
Leukocytes, UA: NEGATIVE
Nitrite: NEGATIVE
PROTEIN: 30 mg/dL — AB
Specific Gravity, Urine: 1.015 (ref 1.005–1.030)
pH: 5 (ref 5.0–8.0)

## 2016-08-28 LAB — TYPE AND SCREEN
ABO/RH(D): O POS
Antibody Screen: NEGATIVE
Unit division: 0

## 2016-08-28 LAB — I-STAT CG4 LACTIC ACID, ED
LACTIC ACID, VENOUS: 4.14 mmol/L — AB (ref 0.5–1.9)
Lactic Acid, Venous: 4.41 mmol/L (ref 0.5–1.9)

## 2016-08-28 LAB — LACTIC ACID, PLASMA: LACTIC ACID, VENOUS: 5.2 mmol/L — AB (ref 0.5–1.9)

## 2016-08-28 LAB — POC OCCULT BLOOD, ED: Fecal Occult Bld: NEGATIVE

## 2016-08-28 LAB — INFLUENZA PANEL BY PCR (TYPE A & B)
Influenza A By PCR: NEGATIVE
Influenza B By PCR: NEGATIVE

## 2016-08-28 LAB — PROCALCITONIN: PROCALCITONIN: 36.42 ng/mL

## 2016-08-28 MED ORDER — MORPHINE SULFATE (PF) 4 MG/ML IV SOLN: 2.0000 mg | INTRAVENOUS | Status: DC | PRN

## 2016-08-28 MED ORDER — VANCOMYCIN HCL 500 MG IV SOLR
500.0000 mg | Freq: Two times a day (BID) | INTRAVENOUS | Status: DC
  Administered 2016-08-29: 500 mg via INTRAVENOUS
  Filled 2016-08-28 (×3): qty 500

## 2016-08-28 MED ORDER — POTASSIUM CHLORIDE 2 MEQ/ML IV SOLN
30.0000 meq | Freq: Once | INTRAVENOUS | Status: AC
  Administered 2016-08-29: 30 meq via INTRAVENOUS
  Filled 2016-08-28 (×2): qty 15

## 2016-08-28 MED ORDER — PANTOPRAZOLE SODIUM 40 MG PO TBEC
40.0000 mg | DELAYED_RELEASE_TABLET | Freq: Every morning | ORAL | Status: DC
  Administered 2016-08-31: 40 mg via ORAL
  Filled 2016-08-28 (×3): qty 1

## 2016-08-28 MED ORDER — PIPERACILLIN-TAZOBACTAM 3.375 G IVPB 30 MIN
3.3750 g | Freq: Once | INTRAVENOUS | Status: AC
  Administered 2016-08-28: 3.375 g via INTRAVENOUS
  Filled 2016-08-28: qty 50

## 2016-08-28 MED ORDER — FUROSEMIDE 10 MG/ML IJ SOLN
20.0000 mg | Freq: Once | INTRAMUSCULAR | Status: AC
  Administered 2016-08-28: 20 mg via INTRAVENOUS
  Filled 2016-08-28: qty 4

## 2016-08-28 MED ORDER — ACETAMINOPHEN 650 MG RE SUPP
650.0000 mg | Freq: Once | RECTAL | Status: AC
  Administered 2016-08-28: 650 mg via RECTAL
  Filled 2016-08-28: qty 1

## 2016-08-28 MED ORDER — VENLAFAXINE HCL ER 150 MG PO CP24
150.0000 mg | ORAL_CAPSULE | Freq: Every day | ORAL | Status: DC
  Administered 2016-08-31: 150 mg via ORAL
  Filled 2016-08-28 (×5): qty 1

## 2016-08-28 MED ORDER — VANCOMYCIN HCL IN DEXTROSE 1-5 GM/200ML-% IV SOLN
1000.0000 mg | Freq: Once | INTRAVENOUS | Status: AC
  Administered 2016-08-28: 1000 mg via INTRAVENOUS
  Filled 2016-08-28: qty 200

## 2016-08-28 MED ORDER — LIDOCAINE 5 % EX PTCH
1.0000 | MEDICATED_PATCH | CUTANEOUS | Status: DC
  Administered 2016-08-29 – 2016-09-05 (×8): 1 via TRANSDERMAL
  Filled 2016-08-28 (×8): qty 1

## 2016-08-28 MED ORDER — PIPERACILLIN-TAZOBACTAM 3.375 G IVPB
3.3750 g | Freq: Three times a day (TID) | INTRAVENOUS | Status: DC
  Administered 2016-08-29 – 2016-09-02 (×14): 3.375 g via INTRAVENOUS
  Filled 2016-08-28 (×14): qty 50

## 2016-08-28 MED ORDER — ONDANSETRON HCL 4 MG/2ML IJ SOLN: 4.0000 mg | Freq: Four times a day (QID) | INTRAMUSCULAR | Status: DC | PRN

## 2016-08-28 MED ORDER — SODIUM CHLORIDE 0.9 % IV BOLUS (SEPSIS)
1000.0000 mL | Freq: Once | INTRAVENOUS | Status: AC
  Administered 2016-08-28: 1000 mL via INTRAVENOUS

## 2016-08-28 MED ORDER — VANCOMYCIN HCL IN DEXTROSE 1-5 GM/200ML-% IV SOLN: 1000.0000 mg | Freq: Two times a day (BID) | INTRAVENOUS | Status: DC

## 2016-08-28 MED ORDER — LORAZEPAM 2 MG/ML IJ SOLN
0.5000 mg | Freq: Once | INTRAMUSCULAR | Status: DC
  Filled 2016-08-28: qty 1

## 2016-08-28 MED ORDER — ACETAMINOPHEN 650 MG RE SUPP
650.0000 mg | RECTAL | Status: DC | PRN
  Administered 2016-08-29 – 2016-09-02 (×2): 650 mg via RECTAL
  Filled 2016-08-28 (×2): qty 1

## 2016-08-28 NOTE — ED Provider Notes (Signed)
[facility] DEPT Provider Note   CSN: [id number] Arrival date & time: 08/28/16  1650     History   Chief Complaint Chief Complaint  Patient presents with  . Weakness  . Altered Mental Status    HPI [name] is a 81 y.o. female.  HPI   81 year old female with a history of AML currently receiving lenalidomide ( currently held ).  Family notes patient was fatigued yesterday, but had no alterations in her mental status.  The note this morning she was altered in extremely weak.  They deny any changes in her respiratory patterns including cough or congestion.  They deny any urinary changes other than noted blood in her urine.  Patient currently followed by Dr. Randell Patient not from oncology.  Patient is known to be neutropenic.   Past Medical History:  Diagnosis Date  . Anemia   . Anxiety   . Arthritis    "hand joints; hips; back" (05/23/2015)  . Cancer ([place])    dec. 2016-leukemia  . Chronic lower back pain   . Depression   . GERD (gastroesophageal reflux disease)   . History of blood transfusion 05/23/2015   "Hgb 5.9"  . Hypertension   . Shortness of breath dyspnea    with exertion    Patient Active Problem List   Diagnosis Date Noted  . Counseling regarding goals of care 06/05/2016  . Routine general medical examination at a health care facility 04/05/2016  . Medicare annual wellness visit, subsequent 04/05/2016  . Myelodysplasia (myelodysplastic syndrome) ([place]) 03/11/2016  . Acute ITP ([place]) 07/28/2015  . Constipation 07/12/2015  . Anemia 07/12/2015  . Chemotherapy induced nausea and vomiting 07/12/2015  . Thrombocytopenia ([place]) 07/04/2015  . AML (acute myeloid leukemia) ([place]) 06/02/2015  . Pancytopenia ([place]) 05/23/2015  . AKI (acute kidney injury) ([place]) 05/23/2015  . HTN (hypertension) 05/23/2015  . Chronic constipation 05/23/2015  . Lumbar and sacral arthritis 05/01/2015  . Depression 04/20/2015  . Dyspnea 04/26/2014  . Bilateral carotid bruits  04/26/2014  . Expected blood loss anemia 04/28/2012  . Overweight (BMI 25.0-29.9) 04/28/2012  . Hyponatremia 04/28/2012  . S/P right TKA 04/27/2012  . Rotator cuff arthropathy 09/13/2011    Past Surgical History:  Procedure Laterality Date  . Bone spur     R thigh  . CATARACT EXTRACTION W/ INTRAOCULAR LENS  IMPLANT, BILATERAL  ~ 2005  . DILATION AND CURETTAGE OF UTERUS    . ESOPHAGOGASTRODUODENOSCOPY (EGD) WITH PROPOFOL N/A 11/08/2014   Procedure: ESOPHAGOGASTRODUODENOSCOPY (EGD) WITH PROPOFOL;  Surgeon: Garlan Fair, MD;  [facility];  Service: Endoscopy;  Laterality: N/A;  . JOINT REPLACEMENT    . TONSILLECTOMY  ~ 1950  . TOTAL KNEE ARTHROPLASTY  04/27/2012   Procedure: TOTAL KNEE ARTHROPLASTY;  Surgeon: Mauri Pole, MD;  Location: [facility];  Service: Orthopedics;  Laterality: Right;  . TOTAL SHOULDER ARTHROPLASTY  09/13/2011   Procedure: TOTAL SHOULDER ARTHROPLASTY;  Surgeon: Augustin Schooling, MD;  Location: [facility];  Service: Orthopedics;  Laterality: Left;  LEFT SHOULDER REVERSED TOTAL SHOULDER ARTHROPLASTY    OB History    No data available       Home Medications    Prior to Admission medications   Medication Sig Start Date End Date Taking? Authorizing Provider  aminocaproic acid (AMICAR) 500 MG tablet Take 1 tablet (500 mg total) by mouth 3 (three) times daily. 08/05/16   [name], NP  fluconazole (DIFLUCAN) 200 MG tablet Take 1 tablet (200 mg total) by mouth  daily. 08/27/16   [name], NP  lenalidomide (REVLIMID) 5 MG capsule Take 1 capsule (5 mg total) by mouth daily. Patient not taking: Reported on 08/26/2016 08/07/16   [name], MD  magic mouthwash w/lidocaine SOLN Take 5 mLs by mouth 4 (four) times daily as needed for mouth pain. Swish and spit Patient not taking: Reported on 08/26/2016 08/23/16   [name], MD  Melatonin 5 MG TABS Take 2.5 mg by mouth at bedtime.    Historical Provider, MD  Multiple Vitamin (MULTIVITAMIN WITH  MINERALS) TABS tablet Take 1 tablet by mouth daily.    Historical Provider, MD  pantoprazole (PROTONIX) 40 MG tablet Take 1 tablet (40 mg total) by mouth every morning. 04/26/16   [name], MD  traMADol (ULTRAM) 50 MG tablet Take 0.5-1 tablets (25-50 mg total) by mouth every 6 (six) hours as needed. 08/26/16   [name], NP  valACYclovir (VALTREX) 1000 MG tablet Take 1 tablet (1,000 mg total) by mouth 2 (two) times daily. 07/29/16   [name], NP  Venlafaxine HCl 150 MG TB24 TAKE ONE TABLET BY MOUTH ONCE DAILY 05/13/16   Golden Circle, FNP  Vitamin D, Ergocalciferol, (DRISDOL) 50000 units CAPS capsule Take 1 capsule (50,000 Units total) by mouth See admin instructions. Every other week. 07/21/15   Golden Circle, FNP    Family History Family History  Problem Relation Age of Onset  . Heart attack Mother 29    Died age 57  . Heart disease Brother     Atrial fib    Social History Social History  Substance Use Topics  . Smoking status: Never Smoker  . Smokeless tobacco: Never Used  . Alcohol use No     Allergies   Cozaar [losartan]; Codeine; Hydrocodone; Oxycodone; and Tetanus toxoids   Review of Systems Review of Systems  All other systems reviewed and are negative.    Physical Exam Updated Vital Signs BP 143/66 (BP Location: Right Arm)   Pulse 103   Temp (!) 104.2 F (40.1 C) (Rectal)   Resp 20   SpO2 (!) 84%   Physical Exam  Constitutional: She is oriented to person, place, and time. She appears well-developed and well-nourished.  HENT:  Head: Normocephalic and atraumatic.  Eyes: Conjunctivae are normal. Pupils are equal, round, and reactive to light. Right eye exhibits no discharge. Left eye exhibits no discharge. No scleral icterus.  Neck: Normal range of motion. No JVD present. No tracheal deviation present.  Cardiovascular: Regular rhythm.   Pulmonary/Chest: Effort normal. No stridor.  Minor crackles left lower   Abdominal: Soft.  There is no tenderness.  Neurological: She is alert and oriented to person, place, and time. Coordination normal.  Skin:  No ulcers or signs of infection  Psychiatric: She has a normal mood and affect. Her behavior is normal. Judgment and thought content normal.  Nursing note and vitals reviewed.   ED Treatments / Results  Labs (all labs ordered are listed, but only abnormal results are displayed) Labs Reviewed  COMPREHENSIVE METABOLIC PANEL - Abnormal; Notable for the following:       Result Value   Sodium 133 (*)    Potassium 3.1 (*)    BUN 22 (*)    Creatinine, Ser 1.13 (*)    Calcium 8.2 (*)    Albumin 2.6 (*)    GFR calc non Af Amer 45 (*)    GFR calc Af Amer 53 (*)  All other components within normal limits  I-STAT CG4 LACTIC ACID, ED - Abnormal; Notable for the following:    Lactic Acid, Venous 4.14 (*)    All other components within normal limits  CULTURE, BLOOD (ROUTINE X 2)  CULTURE, BLOOD (ROUTINE X 2)  CBC WITH DIFFERENTIAL/PLATELET  URINALYSIS, ROUTINE W REFLEX MICROSCOPIC  POC OCCULT BLOOD, ED  I-STAT CG4 LACTIC ACID, ED    EKG  EKG Interpretation None       Radiology Dg Chest 2 View  Result Date: 08/28/2016 CLINICAL DATA:  81 y/o  F; altered mental status and fever. EXAM: CHEST  2 VIEW COMPARISON:  06/01/2014 chest radiograph. FINDINGS: Stable cardiac silhouette given projection and technique. And aortic atherosclerosis with calcification. Left shoulder reverse arthroplasty partially visualized. No acute osseous abnormality. Left lower lobe consolidation and streaky airspace opacities in left mid lung zone. IMPRESSION: Left lower lobe consolidation likely represents pneumonia. These results will be called to the ordering clinician or representative by the Radiologist Assistant, and communication documented in the PACS or zVision Dashboard. Electronically Signed   By: Kristine Garbe M.D.   On: 08/28/2016 18:01    Procedures Procedures  (including critical care time)  CRITICAL CARE Performed by: Elmer Ramp   Total critical care time: 35  minutes  Critical care time was exclusive of separately billable procedures and treating other patients.  Critical care was necessary to treat or prevent imminent or life-threatening deterioration.  Critical care was time spent personally by me on the following activities: development of treatment plan with patient and/or surrogate as well as nursing, discussions with consultants, evaluation of patient's response to treatment, examination of patient, obtaining history from patient or surrogate, ordering and performing treatments and interventions, ordering and review of laboratory studies, ordering and review of radiographic studies, pulse oximetry and re-evaluation of patient's condition.  Medications Ordered in ED Medications  piperacillin-tazobactam (ZOSYN) IVPB 3.375 g (3.375 g Intravenous New Bag/Given 08/28/16 1822)  vancomycin (VANCOCIN) IVPB 1000 mg/200 mL premix (1,000 mg Intravenous New Bag/Given 08/28/16 1821)  sodium chloride 0.9 % bolus 1,000 mL (1,000 mLs Intravenous New Bag/Given 08/28/16 1752)    And  sodium chloride 0.9 % bolus 1,000 mL (1,000 mLs Intravenous New Bag/Given 08/28/16 1757)     Initial Impression / Assessment and Plan / ED Course  I have reviewed the triage vital signs and the nursing notes.  Pertinent labs & imaging results that were available during my care of the patient were reviewed by me and considered in my medical decision making (see chart for details).      Final Clinical Impressions(s) / ED Diagnoses   Final diagnoses:  Severe sepsis ([place])  Community acquired pneumonia of left lung, unspecified part of lung    Labs:  lactic acid point-of-care occult blood, CMP, CBC, blood cultures  Imaging: DG Chest 2 View  Consults: [facility]   Therapeutics: Vancomycin, Zosyn, normal saline x 2 liters   Discharge Meds:   Assessment/Plan:  81 year old female with a history of AML presents today with severe sepsis.  Likely related to pneumonia.  Patient was started on broad-spectrum antibiotics, weight-based fluid resuscitation.  Family is at bedside, who all understand severe nature patient's infection.  Patient is a DNR.   Patient noted to have obvious signs of respiratory distress and diffuse crackles.  Patient was placed on BiPAP which significantly improved her discomfort.  Family is at bedside he would like patient to remain comfortable 65-year-old, patient given 0.5 mg of Ativan  New Prescriptions New Prescriptions   No medications on file     Okey Regal, PA-C 08/28/16 2136    Gareth Morgan, MD 08/29/16 [phone]

## 2016-08-28 NOTE — Progress Notes (Signed)
Pharmacy Antibiotic Follow-up Note  [name] is a 81 y.o. year-old female admitted on 08/28/2016.  The patient is currently on day 1 of Vancomycin & Zosyn for sepsis, PNA. 44 yoF admit 3/7, meets sepsis criteria. CXray with LLL consolidation. Hx pf AML  Assessment/Plan: Vancomycin 1gm x1, then 500mg   IV every 12 hours.  Goal trough 15-20 mcg/mL. Zosyn 3.375g IV q8h (4 hour infusion).  Temp (24hrs), Avg:103.6 F (39.8 C), Min:103 F (39.4 C), Max:104.2 F (40.1 C)   Recent Labs Lab 08/26/16 1058  WBC 3.9   No results for input(s): CREATININE in the last 168 hours. CrCl cannot be calculated (Patient's most recent lab result is older than the maximum 21 days allowed.).    Allergies  Allergen Reactions  . Cozaar [Losartan] Other (See Comments)    Cause pt to lose her taste for foods  . Codeine Itching and Other (See Comments)    Makes feel crazy  PT STATES SHE ALSO CAN NOT TAKE THE SYNTHETIC CODEINES  . Hydrocodone Itching    Tolerates with benadryl  . Oxycodone Itching  . Tetanus Toxoids Swelling and Rash   Antimicrobials this admission: 3/7 Zosyn >>  3/7 Vancomycin >>   Levels/dose changes this admission:  Microbiology results: 3/7 BCx: sent 3/7 Flu panel: in process  Thank you for allowing pharmacy to be a part of this patient's care.  Minda Ditto PharmD Pager [phone] 08/28/2016, 7:48 PM

## 2016-08-28 NOTE — ED Notes (Signed)
Bed: FR10 Expected date:  Expected time:  Means of arrival:  Comments: EMS/?sepsis?

## 2016-08-28 NOTE — ED Triage Notes (Signed)
Pt's daughter reports pt was doing fine last night at 2100, went to check on pt this am 0900.  Per EMT, pt is non-verbal, temp is 104.  Daughter also reports noticing blood in the brief that she was wearing this am.

## 2016-08-28 NOTE — ED Notes (Signed)
Dr. Virgie Dad nurse phones to tell [name] pt. Will be coming in via EMS with c/o weakness and <l.o.c. Her dx is AML; and she is DNR, however, pt. And her family wish to receive all treatment possible short of Code Blue.

## 2016-08-28 NOTE — ED Notes (Signed)
Second set of blood cultures obtained at 1740

## 2016-08-28 NOTE — Telephone Encounter (Addendum)
3/7- this RN spoke with Inez Catalina who verified referral needs to be sent via this office.  Order entered for home health nursing and physical therapy as a verbal order and sent to LC/NP for signiture.   This RN returned call to [place] with [facility]- per her message stating per NP visit recommendation for home health nurse for medication management and monitoring as well as PT for strength and gait concerns.  Obtained VM for Inez Catalina at return call number of [phone] - message left stating approval of above and requested return call for communication on who will be placing order.  This RN's direct number left for return call.

## 2016-08-28 NOTE — ED Notes (Signed)
First set of blood cultures obtained at 1730

## 2016-08-28 NOTE — Telephone Encounter (Signed)
Received phone call from patient daughter [name].  [name] has been very lethargic and is now starting to get fever, temp is 99.8 currently.  She has had two sips of liquids all day.  Patient unable to get up and walk.  Instructed daughter to call 911 and have EMS bring patient to [facility] ED.  Patient with AML, is neutropenic and needs supportive care.  I verbalized to [name] that this could be the end of her mothers life and she may be actively dying, but will need to be evaluated emergently to determine that.  Patient is a DNR, and has already had outpatient palliative care consult.    Gardenia Phlegm, NP

## 2016-08-28 NOTE — H&P (Signed)
History and Physical    [name] EQA:[id number] DOB: 01/26/1936 DOA: 08/28/2016  PCP: [name], FNP  Heme/Onc: [place]  Patient coming from: Home via EMS  Chief Complaint: Altered mental status and fever  HPI: [name] is a 81 y.o. 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  This morning, family noted that she was slow to get up and difficult to arouse.  Apparently she has not verbalized any specific complaints in her current condition, but she felt warm to touch and has had chills.  Family called 911; fever was not documented until arrival in the ED.  The patient has AML and is still receiving active treatment in the form of IVIG and transfusions.  Last IVIG on Thursday and she had chills after that infusion, but they were self-limited and her mental status remained at baseline.  On Monday, she had an unwitnessed fall in her home (she still lives independently).  She has only complained of pain "at her waistline" since then, but she has not had apparent bruising.  She was able to get herself up.  She told her family about it the next day.  No known head trauma.  She was also evaluated by a palliative care nurse on Tuesday, and family was advised that she is no longer safe to live alone.  Due to increased weakness over the past several weeks, family had already been encouraging her to use her walker.  No chest pain, shortness of breath, headache, back pain, or abdominal pain.  Her daughter noted hematuria in the last 24 hours.  She was treated for a UTI about one month ago. (Strong urine odor noted in the ED.)  Revlimid has been on hold due to mouth ulcers, which improved some with valtrex.  However, family reports that she has had purulent drainage from a lesion inside her upper lip, which has not been treated with antibiotics  as an outpatient.  Recent use of tramadol and percocet (despite documented allergy) for mouth pain.  She has been vaccinated for influenza and pneumonia.  She was exposed to a granddaughter with RSV two weeks ago.   ED Course: Fever to 104 in the ED.  Lactic acid greater than 4.  Code sepsis called.  S/P 2L NS plus vanc and zosyn.  She developed subsequent respiratory distress and progressed rapidly from 4L [place] to NRB to BiPAP.  Respiratory status has improved.  Chest xray shows LLL infiltrate.  Flu screen pending.  Blood cultures pending.  Urine studies pending.  Hospitalist asked to admit.  Review of Systems: Unable to obtain from patient due to altered mental status.   Past Medical History:  Diagnosis Date  . Anemia   . Anxiety   . Arthritis    "hand joints; hips; back" (05/23/2015)  . Cancer ([place])    dec. 2016-leukemia  . Chronic lower back pain   . Depression   . GERD (gastroesophageal reflux disease)   . History of blood transfusion 05/23/2015   "Hgb 5.9"  . Hypertension   . Shortness of breath dyspnea    with exertion    Past Surgical History:  Procedure Laterality Date  . Bone spur     R thigh  . CATARACT EXTRACTION W/ INTRAOCULAR LENS  IMPLANT, BILATERAL  ~ 2005  . DILATION AND CURETTAGE OF UTERUS    .  ESOPHAGOGASTRODUODENOSCOPY (EGD) WITH PROPOFOL N/A 11/08/2014   Procedure: ESOPHAGOGASTRODUODENOSCOPY (EGD) WITH PROPOFOL;  Surgeon: Garlan Fair, MD;  Location: [facility];  Service: Endoscopy;  Laterality: N/A;  . JOINT REPLACEMENT    . TONSILLECTOMY  ~ 1950  . TOTAL KNEE ARTHROPLASTY  04/27/2012   Procedure: TOTAL KNEE ARTHROPLASTY;  Surgeon: Mauri Pole, MD;  Location: [facility];  Service: Orthopedics;  Laterality: Right;  . TOTAL SHOULDER ARTHROPLASTY  09/13/2011   Procedure: TOTAL SHOULDER ARTHROPLASTY;  Surgeon: Augustin Schooling, MD;  Location: [place];  Service: Orthopedics;  Laterality: Left;  LEFT SHOULDER REVERSED TOTAL SHOULDER ARTHROPLASTY     reports  that she has never smoked. She has never used smokeless tobacco. She reports that she does not drink alcohol or use drugs.  Still lives independently.  Allergies  Allergen Reactions  . Cozaar [Losartan] Other (See Comments)    Cause pt to lose her taste for foods  . Codeine Itching and Other (See Comments)    Makes feel crazy  PT STATES SHE ALSO CAN NOT TAKE THE SYNTHETIC CODEINES  . Hydrocodone Itching    Tolerates with benadryl  . Oxycodone Itching  . Tetanus Toxoids Swelling and Rash    Family History  Problem Relation Age of Onset  . Heart attack Mother 58    Died age 89  . Heart disease Brother     Atrial fib     Prior to Admission medications   Medication Sig Start Date End Date Taking? Authorizing Provider  acetaminophen (TYLENOL) 500 MG tablet Take 1,000 mg by mouth every 6 (six) hours as needed for moderate pain.   Yes Historical Provider, MD  aminocaproic acid (AMICAR) 500 MG tablet Take 1 tablet (500 mg total) by mouth 3 (three) times daily. 08/05/16  Yes [name], NP  lidocaine (LIDODERM) 5 % Place 1 patch onto the skin daily. Remove & Discard patch within 12 hours or as directed by MD   Yes Historical Provider, MD  magic mouthwash w/lidocaine SOLN Take 5 mLs by mouth 4 (four) times daily as needed for mouth pain. Swish and spit 08/23/16  Yes [name], MD  Melatonin 5 MG TABS Take 2.5 mg by mouth at bedtime.   Yes Historical Provider, MD  pantoprazole (PROTONIX) 40 MG tablet Take 1 tablet (40 mg total) by mouth every morning. 04/26/16  Yes [name], MD  traMADol (ULTRAM) 50 MG tablet Take 0.5-1 tablets (25-50 mg total) by mouth every 6 (six) hours as needed. Patient taking differently: Take 25-50 mg by mouth every 6 (six) hours as needed for moderate pain or severe pain.  08/26/16  Yes [name], NP  valACYclovir (VALTREX) 1000 MG tablet Take 1 tablet (1,000 mg total) by mouth 2 (two) times daily. 07/29/16  Yes [name], NP    Venlafaxine HCl 150 MG TB24 TAKE ONE TABLET BY MOUTH ONCE DAILY 05/13/16  Yes Golden Circle, FNP  fluconazole (DIFLUCAN) 200 MG tablet Take 1 tablet (200 mg total) by mouth daily. 08/27/16   [name], NP  lenalidomide (REVLIMID) 5 MG capsule Take 1 capsule (5 mg total) by mouth daily. Patient not taking: Reported on 08/26/2016 08/07/16   [name], MD  Vitamin D, Ergocalciferol, (DRISDOL) 50000 units CAPS capsule Take 1 capsule (50,000 Units total) by mouth See admin instructions. Every other week. Patient not taking: Reported on 08/28/2016 07/21/15   Golden Circle, FNP    Physical Exam: Vitals:  08/28/16 2058 08/28/16 2100 08/28/16 2101 08/28/16 2134  BP:  116/66 116/66 107/57  Pulse:  99 98 92  Resp:  (!) 28 (!) 28 24  Temp: 102.2 F (39 C)     TempSrc: Rectal     SpO2:  99% 100% 94%      Constitutional: NAD, calm, tolerating BiPap but lethargic and difficult to arouse Vitals:   08/28/16 2058 08/28/16 2100 08/28/16 2101 08/28/16 2134  BP:  116/66 116/66 107/57  Pulse:  99 98 92  Resp:  (!) 28 (!) 28 24  Temp: 102.2 F (39 C)     TempSrc: Rectal     SpO2:  99% 100% 94%   Eyes: PERRL, lids and conjunctivae normal ENMT: Exam deferred due to need for continuous BiPAP at this time. Neck: normal appearance, supple, no masses Respiratory: Bilateral, diffuse crackles.  Diminished breath sounds at left lung base.  No wheezing.  Normal respiratory effort. No accessory muscle use.  Cardiovascular: Normal rate, regular rhythm, no murmurs / rubs / gallops. No extremity edema. 2+ pedal pulses.  GI: abdomen is soft and compressible.  No distention.  No tenderness.  No masses palpated.  Bowel sounds are present. Musculoskeletal:  No joint deformity in upper and lower extremities. Patient in fetal position.  She is not flaccid; still has some tone.  Moves extremities intermittently, on command with repetitive prompting.  No contractures.    Skin: no rashes, warm and dry.   No apparent bruising.   Neurologic: Limited by altered mental status.  Inconsistently following commands. Psychiatric: Disoriented.  Judgment impaired.  Lethargic.    Labs on Admission: I have personally reviewed following labs and imaging studies  CBC:  Recent Labs Lab 08/26/16 1058 08/28/16 1745  WBC 3.9 4.7  NEUTROABS  --  0.0*  HGB 7.1* 9.6*  HCT 20.6* 27.5*  MCV 91.2 89.0  PLT 2* 15*   Basic Metabolic Panel:  Recent Labs Lab 08/28/16 1745  NA 133*  K 3.1*  CL 101  CO2 23  GLUCOSE 99  BUN 22*  CREATININE 1.13*  CALCIUM 8.2*   GFR: Estimated Creatinine Clearance: 40.7 mL/min (by C-G formula based on SCr of 1.13 mg/dL (H)). Liver Function Tests:  Recent Labs Lab 08/28/16 1745  AST 30  ALT 19  ALKPHOS 122  BILITOT 1.2  PROT 7.0  ALBUMIN 2.6*   Urine analysis: Pending  Sepsis Labs:  Lactic acid level 4.14 then 4.41  Radiological Exams on Admission: Dg Chest 2 View  Result Date: 08/28/2016 CLINICAL DATA:  81 y/o  F; altered mental status and fever. EXAM: CHEST  2 VIEW COMPARISON:  06/01/2014 chest radiograph. FINDINGS: Stable cardiac silhouette given projection and technique. And aortic atherosclerosis with calcification. Left shoulder reverse arthroplasty partially visualized. No acute osseous abnormality. Left lower lobe consolidation and streaky airspace opacities in left mid lung zone. IMPRESSION: Left lower lobe consolidation likely represents pneumonia. These results will be called to the ordering clinician or representative by the Radiologist Assistant, and communication documented in the PACS or zVision Dashboard. Electronically Signed   By: Kristine Garbe M.D.   On: 08/28/2016 18:01    EKG: Independently reviewed by me.  NSR.  She is not tachycardic.  No acute ST segment elevation.  Assessment/Plan Principal Problem:   Sepsis ([place]) Active Problems:   Pancytopenia (HCC)   AML (acute myeloid leukemia) (HCC)   Acute encephalopathy    Fall   Hypokalemia   Lactic acidosis      Sepsis  secondary to pneumonia, favor treating as HCAP based on history, need for frequent transfusions, and neutropenic fever, with acute encephalopathy --Continue coverage with vanc and zosyn for now --Neutropenic precautions --Blood and urine cultures requested --Urine streptococcal antigen --Repeat lactic acid level and check procalcitonin at 2300 --Flu screen pending --NPO until mental status improves and she is weaned from BiPAP   Acute hypoxic respiratory failure secondary to sepsis, HCAP --ABG now --Wean BiPAP as tolerated, per RT protocol --Lasix 20mg  IV one time now --Avoid maintenance fluids for now --Turn q2h, respiratory therapy  Pancytopenia secondary to history of AML,now transfusion dependent --Neutropenic precautions (ANC zero) --Type and screen --Will give platelets for platelet count less than 10 or signs of active bleeding --STAT head CT and CT A/P without contrast to screen for occult hemorrhage (given recent unwitnessed fall and thrombocytopenia). --Will need to notify Dr. Jana Hakim of admission in the AM  Hypokalemia --Replacement ordered    DVT prophylaxis: SCDs, she is pancytopenic Code Status: DNR Family Communication: Son, daughter, and close friend at the bedside in the ED at the time of admission.  They were given an opportunity to ask questions re: the plan of care.  Plan of care explained to their apparent satisfaction at this time. Disposition Plan: To be determined.   Consults called: NONE.  Will need to contact Dr. Jana Hakim or colleague in the AM. Admission status: Inpatient, stepdown unit.  I expect this patient will need inpatient services for greater than two midnights.   TIME SPENT: 75 minutes   Eber Jones MD [facility] Pager [phone]  If 7PM-7AM, please contact night-coverage [email or web address] Password [facility]  08/28/2016, 9:46 PM

## 2016-08-28 NOTE — Progress Notes (Signed)
Pt transported to CT and back to ED14 on bipap 100% fio2.  Pt tolerated transport well without incident.

## 2016-08-29 DIAGNOSIS — J189 Pneumonia, unspecified organism: Secondary | ICD-10-CM

## 2016-08-29 DIAGNOSIS — C9202 Acute myeloblastic leukemia, in relapse: Secondary | ICD-10-CM

## 2016-08-29 DIAGNOSIS — R7881 Bacteremia: Secondary | ICD-10-CM

## 2016-08-29 DIAGNOSIS — J9601 Acute respiratory failure with hypoxia: Secondary | ICD-10-CM

## 2016-08-29 DIAGNOSIS — G9349 Other encephalopathy: Secondary | ICD-10-CM

## 2016-08-29 DIAGNOSIS — C9200 Acute myeloblastic leukemia, not having achieved remission: Secondary | ICD-10-CM

## 2016-08-29 DIAGNOSIS — E876 Hypokalemia: Secondary | ICD-10-CM

## 2016-08-29 DIAGNOSIS — D61818 Other pancytopenia: Secondary | ICD-10-CM

## 2016-08-29 DIAGNOSIS — A419 Sepsis, unspecified organism: Principal | ICD-10-CM

## 2016-08-29 DIAGNOSIS — Y92000 Kitchen of unspecified non-institutional (private) residence as  the place of occurrence of the external cause: Secondary | ICD-10-CM

## 2016-08-29 DIAGNOSIS — W19XXXA Unspecified fall, initial encounter: Secondary | ICD-10-CM

## 2016-08-29 LAB — BLOOD CULTURE ID PANEL (REFLEXED)
Acinetobacter baumannii: NOT DETECTED
Candida albicans: NOT DETECTED
Candida glabrata: NOT DETECTED
Candida krusei: NOT DETECTED
Candida parapsilosis: NOT DETECTED
Candida tropicalis: NOT DETECTED
Carbapenem resistance: NOT DETECTED
Enterobacter cloacae complex: NOT DETECTED
Enterobacteriaceae species: DETECTED — AB
Enterococcus species: NOT DETECTED
Escherichia coli: NOT DETECTED
Haemophilus influenzae: NOT DETECTED
Klebsiella oxytoca: DETECTED — AB
Klebsiella pneumoniae: NOT DETECTED
Listeria monocytogenes: NOT DETECTED
Neisseria meningitidis: NOT DETECTED
Proteus species: NOT DETECTED
Pseudomonas aeruginosa: NOT DETECTED
Serratia marcescens: NOT DETECTED
Staphylococcus aureus (BCID): NOT DETECTED
Staphylococcus species: NOT DETECTED
Streptococcus agalactiae: NOT DETECTED
Streptococcus pneumoniae: NOT DETECTED
Streptococcus pyogenes: NOT DETECTED
Streptococcus species: NOT DETECTED

## 2016-08-29 LAB — BASIC METABOLIC PANEL
ANION GAP: 10 (ref 5–15)
BUN: 28 mg/dL — ABNORMAL HIGH (ref 6–20)
CALCIUM: 7.8 mg/dL — AB (ref 8.9–10.3)
CO2: 20 mmol/L — AB (ref 22–32)
Chloride: 107 mmol/L (ref 101–111)
Creatinine, Ser: 1.32 mg/dL — ABNORMAL HIGH (ref 0.44–1.00)
GFR, EST AFRICAN AMERICAN: 44 mL/min — AB (ref >60)
GFR, EST NON AFRICAN AMERICAN: 38 mL/min — AB (ref >60)
Glucose, Bld: 102 mg/dL — ABNORMAL HIGH (ref 65–99)
POTASSIUM: 4.6 mmol/L (ref 3.5–5.1)
Sodium: 137 mmol/L (ref 135–145)

## 2016-08-29 LAB — CBC WITH DIFFERENTIAL/PLATELET
BASOS ABS: 0 10*3/uL (ref 0.0–0.1)
BASOS ABS: 0 10*3/uL (ref 0.0–0.1)
Basophils Relative: 0 %
Basophils Relative: 0 %
Blasts: 45 %
Blasts: 37 %
EOS ABS: 0 10*3/uL (ref 0.0–0.7)
EOS PCT: 0 %
EOS PCT: 0 %
Eosinophils Absolute: 0 10*3/uL (ref 0.0–0.7)
HEMATOCRIT: 27.5 % — AB (ref 36.0–46.0)
HEMATOCRIT: 22.6 % — AB (ref 36.0–46.0)
Hemoglobin: 9.6 g/dL — ABNORMAL LOW (ref 12.0–15.0)
Hemoglobin: 8 g/dL — ABNORMAL LOW (ref 12.0–15.0)
LYMPHS ABS: 2.6 10*3/uL (ref 0.7–4.0)
LYMPHS PCT: 61 %
Lymphocytes Relative: 54 %
Lymphs Abs: 4.6 10*3/uL — ABNORMAL HIGH (ref 0.7–4.0)
MCH: 31.1 pg (ref 26.0–34.0)
MCH: 31.7 pg (ref 26.0–34.0)
MCHC: 34.9 g/dL (ref 30.0–36.0)
MCHC: 35.4 g/dL (ref 30.0–36.0)
MCV: 89 fL (ref 78.0–100.0)
MCV: 89.7 fL (ref 78.0–100.0)
MONOS PCT: 1 %
MONOS PCT: 2 %
Monocytes Absolute: 0.1 10*3/uL (ref 0.1–1.0)
Monocytes Absolute: 0.1 10*3/uL (ref 0.1–1.0)
NEUTROS PCT: 0 %
NEUTROS PCT: 0 %
Neutro Abs: 0 10*3/uL — ABNORMAL LOW (ref 1.7–7.7)
Neutro Abs: 0 10*3/uL — ABNORMAL LOW (ref 1.7–7.7)
Platelets: 15 10*3/uL — CL (ref 150–400)
Platelets: 10 10*3/uL — CL (ref 150–400)
RBC: 3.09 MIL/uL — AB (ref 3.87–5.11)
RBC: 2.52 MIL/uL — AB (ref 3.87–5.11)
RDW: 16.4 % — AB (ref 11.5–15.5)
RDW: 16.7 % — AB (ref 11.5–15.5)
WBC: 4.7 10*3/uL (ref 4.0–10.5)
WBC: 4.2 10*3/uL (ref 4.0–10.5)

## 2016-08-29 LAB — STREP PNEUMONIAE URINARY ANTIGEN: Strep Pneumo Urinary Antigen: NEGATIVE

## 2016-08-29 LAB — LACTIC ACID, PLASMA: Lactic Acid, Venous: 4.5 mmol/L (ref 0.5–1.9)

## 2016-08-29 LAB — MRSA PCR SCREENING: MRSA by PCR: NEGATIVE

## 2016-08-29 MED ORDER — MORPHINE SULFATE (PF) 4 MG/ML IV SOLN
2.0000 mg | INTRAVENOUS | Status: DC | PRN
  Administered 2016-08-29 – 2016-08-31 (×3): 2 mg via INTRAVENOUS
  Filled 2016-08-29 (×3): qty 1

## 2016-08-29 MED ORDER — VANCOMYCIN HCL IN DEXTROSE 1-5 GM/200ML-% IV SOLN
1000.0000 mg | INTRAVENOUS | Status: DC
Start: 2016-08-30 — End: 2016-08-30

## 2016-08-29 NOTE — Progress Notes (Signed)
PHARMACY - PHYSICIAN COMMUNICATION CRITICAL VALUE ALERT - BLOOD CULTURE IDENTIFICATION (BCID)  Results for orders placed or performed during the hospital encounter of 08/28/16  Blood Culture ID Panel (Reflexed) (Collected: 08/28/2016  5:39 PM)  Result Value Ref Range   Enterococcus species NOT DETECTED NOT DETECTED   Listeria monocytogenes NOT DETECTED NOT DETECTED   Staphylococcus species NOT DETECTED NOT DETECTED   Staphylococcus aureus NOT DETECTED NOT DETECTED   Streptococcus species NOT DETECTED NOT DETECTED   Streptococcus agalactiae NOT DETECTED NOT DETECTED   Streptococcus pneumoniae NOT DETECTED NOT DETECTED   Streptococcus pyogenes NOT DETECTED NOT DETECTED   Acinetobacter baumannii NOT DETECTED NOT DETECTED   Enterobacteriaceae species DETECTED (A) NOT DETECTED   Enterobacter cloacae complex NOT DETECTED NOT DETECTED   Escherichia coli NOT DETECTED NOT DETECTED   Klebsiella oxytoca DETECTED (A) NOT DETECTED   Klebsiella pneumoniae NOT DETECTED NOT DETECTED   Proteus species NOT DETECTED NOT DETECTED   Serratia marcescens NOT DETECTED NOT DETECTED   Carbapenem resistance NOT DETECTED NOT DETECTED   Haemophilus influenzae NOT DETECTED NOT DETECTED   Neisseria meningitidis NOT DETECTED NOT DETECTED   Pseudomonas aeruginosa NOT DETECTED NOT DETECTED   Candida albicans NOT DETECTED NOT DETECTED   Candida glabrata NOT DETECTED NOT DETECTED   Candida krusei NOT DETECTED NOT DETECTED   Candida parapsilosis NOT DETECTED NOT DETECTED   Candida tropicalis NOT DETECTED NOT DETECTED    Name of physician (or Provider) Contacted: [place]  Changes to prescribed antibiotics required: continue vancomycin and zosyn for now since patient's also being treated for PNA  Dia Sitter P 08/29/2016  11:51 AM

## 2016-08-29 NOTE — Progress Notes (Signed)
Pt currently resting comfortably on 4 LPM [place] and tolerating well at this time.  RT will hold BIPAP at this time, RT to monitor and assess as needed.

## 2016-08-29 NOTE — Progress Notes (Signed)
Pharmacy Antibiotic Follow-up Note  [name] is a 81 y.o. year-old female admitted on 08/28/2016 with pneumonia and sepsis in the setting of profound neutropenia secondary to AML.   She is currently on D#2 of vancomycin and Zosyn with pharmacy dosing assistance.  BCID indicated Klebsiella oxytoca in blood; blood culture is pending; continuing broad-spectrum coverage (Zosyn) pending susceptibility results.  MD also continuing vancomycin for now due to presence of pneumonia.   SCr is up slightly compared to yesterday.  Assessment/Plan: Change vancomycin to 1 gram IV q24h, next dose 0800 3/9 (review AM SCr before next dose). Continue Zosyn 3.375 grams IV q8h (4-hour infusion) F/U on blood culture results Follow renal function closely.  Temp (24hrs), Avg:102.1 F (38.9 C), Min:99.9 F (37.7 C), Max:104.2 F (40.1 C)   Recent Labs Lab 08/26/16 1058 08/28/16 1745 08/29/16 0410  WBC 3.9 4.7 4.2     Recent Labs Lab 08/28/16 1745 08/28/16 2233 08/29/16 0410  CREATININE 1.13* 1.15* 1.32*   Estimated Creatinine Clearance: 35.4 mL/min (by C-G formula based on SCr of 1.32 mg/dL (H)).    Allergies  Allergen Reactions  . Cozaar [Losartan] Other (See Comments)    Cause pt to lose her taste for foods  . Codeine Itching and Other (See Comments)    Makes feel crazy  PT STATES SHE ALSO CAN NOT TAKE THE SYNTHETIC CODEINES  . Hydrocodone Itching    Tolerates with benadryl  . Oxycodone Itching  . Tetanus Toxoids Swelling and Rash   Antimicrobials this admission: 3/7 Zosyn >>  3/7 Vancomycin >>   Levels/dose changes this admission: 3/8 Vanco reduced to 1 gram q24h due to SCr bump to 1.32.  Microbiology results: 3/7 Blood: GNR 3/7 BCID: Klebsiella oxytoca 3/7 Influenza A/B: neg/neg 3/7 S.pneumo UAg: negative 3/7 Urine: collected   Thank you for allowing pharmacy to be a part of this patient's care.  Clayburn Pert, PharmD, BCPS Pager: [phone] 08/29/2016  1:46 PM

## 2016-08-29 NOTE — Progress Notes (Signed)
[facility] PROGRESS NOTE  [name] OEV:[id number] DOB: Jun 21, 1936 DOA: 08/28/2016 PCP: [name], FNP  Interim summary and HPI 81 y.o. 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 Found to have sepsis and neutropenic fever. Also with acute resp failure with hypoxia due to HCAP.                   Assessment/Plan: Sepsis secondary to HCAP and Klebsiella bacteremia --Continue coverage with vanc and zosyn for now --Continue Neutropenic precautions --Blood cultures growing Klebsiella Oxytoca --Urine streptococcal antigen neg --Will Repeat lactic acid level in am --Flu screen neg --NPO until mental status improves and she is weaned from BiPAP  Acute hypoxic respiratory failure secondary to sepsis, HCAP --Wean BiPAP as tolerated, per RT protocol --Lasix 20mg  IV one time given after aggressive IVF's in ED; patient with signs of vascular congestion and crackles on exam. No further lasix given elevated lactic acid and sepsis. --will Avoid maintenance fluids for now --Turn q2h, PRN neb therapy --flutter valve when off BIPAP  Pancytopenia secondary to history of AML,now transfusion dependent --Neutropenic precautions (ANC zero) --continue IV antibiotics and follow cultures results --Will give platelets for platelet count less than 10 or signs of active bleeding --PRBC's for Hgb less than 8 --STAT head CT and CT A/P without acute abnormality --notified Dr. Jana Hakim of admission   Hypokalemia --will follow trend and replete further as needed --will check Mg level   Code Status: DNR Family Communication: no family at bedside  Disposition Plan: to be determined. Prognosis is poor overall. Continue IV antibiotics; continue BIPAP and weaned as tolerated. Minimize PO meds and PO intake for the next 24 hours given concerns for  aspiration with current mentation.   Consultants:  Oncology   Procedures:  See below for x-ray reports   Antibiotics:  vanc and zosyn 08/28/16  HPI/Subjective: Afebrile currently; but with Tmax of 103.2 overnight. denies CP. Still SOB and requiring BIPAP.  Objective: Vitals:   08/29/16 1400 08/29/16 1500  BP: (!) 124/58 133/67  Pulse:  100  Resp: (!) 23 (!) 26  Temp:      Intake/Output Summary (Last 24 hours) at 08/29/16 1650 Last data filed at 08/29/16 1058  Gross per 24 hour  Intake              150 ml  Output                0 ml  Net              150 ml   Filed Weights   08/29/16 1151  Weight: 66 kg (145 lb 8.1 oz)    Exam:   General: patient is confused, still with ongoing SOB and requirement of BIPAP. No fever, no CP. Following simple commands.  Cardiovascular: S1 and s2, no rubs or gallops   Respiratory: tachypneic, with mild use of accessory muscles; diffuse rhonchi, no wheezing; fine crackles heard.  Abdomen: soft, no guarding, positive BS  Musculoskeletal: no cyanosis or clubbing   Data Reviewed: Basic Metabolic Panel:  Recent Labs Lab 08/28/16 1745 08/28/16 2233 08/29/16 0410  NA 133* 132* 137  K 3.1* 3.0* 4.6  CL 101 102 107  CO2 23 20* 20*  GLUCOSE 99 100* 102*  BUN 22* 22* 28*  CREATININE 1.13* 1.15* 1.32*  CALCIUM 8.2* 7.5* 7.8*   Liver Function Tests:  Recent Labs Lab 08/28/16 1745 08/28/16 2233  AST 30 37  ALT 19 19  ALKPHOS 122 99  BILITOT 1.2 1.3*  PROT 7.0 6.5  ALBUMIN 2.6* 2.1*   CBC:  Recent Labs Lab 08/26/16 1058 08/28/16 1745 08/29/16 0410  WBC 3.9 4.7 4.2  NEUTROABS  --  0.0* 0.0*  HGB 7.1* 9.6* 8.0*  HCT 20.6* 27.5* 22.6*  MCV 91.2 89.0 89.7  PLT 2* 15* 10*   CBG: No results for input(s): GLUCAP in the last 168 hours.  Recent Results (from the past 240 hour(s))  Blood Culture (routine x 2)     Status: None (Preliminary result)   Collection Time: 08/28/16  5:30 PM  Result Value Ref Range Status    Specimen Description BLOOD LEFT WRIST  Final   Special Requests BOTTLES DRAWN AEROBIC ONLY 5CC  Final   Culture  Setup Time   Final    GRAM NEGATIVE RODS AEROBIC BOTTLE ONLY CRITICAL VALUE NOTED.  VALUE IS CONSISTENT WITH PREVIOUSLY REPORTED AND CALLED VALUE. Performed at [facility], [place] [address], [place], [place] [address]    Culture GRAM NEGATIVE RODS  Final   Report Status PENDING  Incomplete  Blood Culture (routine x 2)     Status: None (Preliminary result)   Collection Time: 08/28/16  5:39 PM  Result Value Ref Range Status   Specimen Description BLOOD RIGHT WRIST  Final   Special Requests BOTTLES DRAWN AEROBIC AND ANAEROBIC 5CC  Final   Culture  Setup Time   Final    GRAM NEGATIVE RODS IN BOTH AEROBIC AND ANAEROBIC BOTTLES Organism ID to follow CRITICAL RESULT CALLED TO, READ BACK BY AND VERIFIED WITH: J. Scherrie November Pharm.D. 11:15 08/29/16 ([email or web address]) Performed at [facility], [place] [address] [place], [place] [address]    Culture GRAM NEGATIVE RODS  Final   Report Status PENDING  Incomplete  Blood Culture ID Panel (Reflexed)     Status: Abnormal   Collection Time: 08/28/16  5:39 PM  Result Value Ref Range Status   Enterococcus species NOT DETECTED NOT DETECTED Final   Listeria monocytogenes NOT DETECTED NOT DETECTED Final   Staphylococcus species NOT DETECTED NOT DETECTED Final   Staphylococcus aureus NOT DETECTED NOT DETECTED Final   Streptococcus species NOT DETECTED NOT DETECTED Final   Streptococcus agalactiae NOT DETECTED NOT DETECTED Final   Streptococcus pneumoniae NOT DETECTED NOT DETECTED Final   Streptococcus pyogenes NOT DETECTED NOT DETECTED Final   Acinetobacter baumannii NOT DETECTED NOT DETECTED Final   Enterobacteriaceae species DETECTED (A) NOT DETECTED Final    Comment: Enterobacteriaceae represent a large family of gram-negative bacteria, not a single organism. CRITICAL RESULT CALLED TO, READ BACK BY AND VERIFIED WITH: J. Scherrie November Pharm.D.  11:15 08/29/16 ([email or web address])    Enterobacter cloacae complex NOT DETECTED NOT DETECTED Final   Escherichia coli NOT DETECTED NOT DETECTED Final   Klebsiella oxytoca DETECTED (A) NOT DETECTED Final    Comment: CRITICAL RESULT CALLED TO, READ BACK BY AND VERIFIED WITH: J. Scherrie November Pharm.D. 11:15 08/29/16 ([email or web address])    Klebsiella pneumoniae NOT DETECTED NOT DETECTED Final   Proteus species NOT DETECTED NOT DETECTED Final   Serratia marcescens NOT DETECTED NOT DETECTED Final   Carbapenem resistance NOT DETECTED NOT DETECTED Final   Haemophilus influenzae NOT DETECTED NOT DETECTED Final   Neisseria meningitidis NOT DETECTED NOT DETECTED Final   Pseudomonas aeruginosa NOT DETECTED NOT DETECTED Final   Candida albicans NOT DETECTED NOT DETECTED Final   Candida glabrata  NOT DETECTED NOT DETECTED Final   Candida krusei NOT DETECTED NOT DETECTED Final   Candida parapsilosis NOT DETECTED NOT DETECTED Final   Candida tropicalis NOT DETECTED NOT DETECTED Final    Comment: Performed at [facility], [place] [address], [place], [place] [address]  MRSA PCR Screening     Status: None   Collection Time: 08/29/16 12:17 PM  Result Value Ref Range Status   MRSA by PCR NEGATIVE NEGATIVE Final    Comment:        The GeneXpert MRSA Assay (FDA approved for NASAL specimens only), is one component of a comprehensive MRSA colonization surveillance program. It is not intended to diagnose MRSA infection nor to guide or monitor treatment for MRSA infections.      Studies: Ct Abdomen Pelvis Wo Contrast  Result Date: 08/28/2016 CLINICAL DATA:  Altered mental status with fever. Daughter noticed blood in the patient's briefs. EXAM: CT ABDOMEN AND PELVIS WITHOUT CONTRAST TECHNIQUE: Multidetector CT imaging of the abdomen and pelvis was performed following the standard protocol without IV contrast. COMPARISON:  04/17/2010 pelvic CT FINDINGS: Lower chest: Borderline cardiomegaly without pericardial effusion. Large hiatal  hernia. Patchy airspace opacities at each lung base. Pneumonia is not excluded. Tiny nodular densities noted in the left lower lobe measuring up to 5 mm. No pleural effusion or pneumothorax. Hepatobiliary: No focal liver abnormality is seen. No gallstones, gallbladder wall thickening, or biliary dilatation. Pancreas: No pancreatic mass or ductal dilatation. Due to motion artifacts, assessment for pancreatic inflammation is compromised. Spleen: Normal in size without focal abnormality. Adrenals/Urinary Tract: No adrenal nodule. Mild left adrenal gland thickening. No nephrolithiasis or renal mass. No hydroureteronephrosis. Distended bladder without focal mural abnormality or calculus. Stomach/Bowel: Normal small bowel rotation. No bowel obstruction. Moderate colonic stool burden. There is left-sided colonic diverticulosis. The appendix is not visualized. No definite cecal inflammation however. Vascular/Lymphatic: Aorto bi-iliac atherosclerosis without aneurysm. No lymphadenopathy. Reproductive: Uterus and bilateral adnexa are unremarkable. Other: No abdominal wall hernia or abnormality. No abdominopelvic ascites. Musculoskeletal: Chronic insufficiency fracture of the right sacral ala is not well visualized on current exam. Osteoarthritis of the sacroiliac joints. Lower lumbar facet arthropathy. Dextroscoliosis of the thoracolumbar junction. No acute osseous appearing abnormality. IMPRESSION: 1. Large hiatal hernia. 2. Patchy airspace opacities within both visualized lower lobes. Pneumonia is not entirely excluded. Tiny 5 mm or less nodular densities in the medial left lower lobe adjacent atelectatic lung may be postinfectious or postinflammatory in etiology. Pulmonary nodules are not entirely excluded. No follow-up needed if patient is low-risk (and has no known or suspected primary neoplasm). Non-contrast chest CT can be considered in 12 months if patient is high-risk. This recommendation follows the consensus  statement: Guidelines for Management of Incidental Pulmonary Nodules Detected on CT Images: From the Fleischner Society 2017; Radiology 2017; 284:228-243. 3. Colonic diverticulosis without acute diverticulitis. 4. Aorto bi-iliac atherosclerosis without aneurysm. Electronically Signed   By: Ashley Royalty M.D.   On: 08/28/2016 22:51   Dg Chest 2 View  Result Date: 08/28/2016 CLINICAL DATA:  81 y/o  F; altered mental status and fever. EXAM: CHEST  2 VIEW COMPARISON:  06/01/2014 chest radiograph. FINDINGS: Stable cardiac silhouette given projection and technique. And aortic atherosclerosis with calcification. Left shoulder reverse arthroplasty partially visualized. No acute osseous abnormality. Left lower lobe consolidation and streaky airspace opacities in left mid lung zone. IMPRESSION: Left lower lobe consolidation likely represents pneumonia. These results will be called to the ordering clinician or representative by the Radiologist Assistant,  and communication documented in the PACS or zVision Dashboard. Electronically Signed   By: Kristine Garbe M.D.   On: 08/28/2016 18:01   Ct Head Wo Contrast  Result Date: 08/28/2016 CLINICAL DATA:  Altered mental status.  Nonverbal. EXAM: CT HEAD WITHOUT CONTRAST TECHNIQUE: Contiguous axial images were obtained from the base of the skull through the vertex without intravenous contrast. COMPARISON:  None. FINDINGS: Study is slightly limited by patient motion. BRAIN: There is sulcal and ventricular prominence consistent with superficial and central atrophy. No intraparenchymal hemorrhage, mass effect nor midline shift. Periventricular and subcortical white matter hypodensities consistent with chronic small vessel ischemic disease are identified. No acute large vascular territory infarcts. No abnormal extra-axial fluid collections. Basal cisterns are not effaced and midline. VASCULAR: Mild-to-moderate calcific atherosclerosis of the carotid siphons. SKULL: No skull  fracture. No significant scalp soft tissue swelling. SINUSES/ORBITS: The mastoid air-cells are clear. The included paranasal sinuses are well-aerated.The included ocular globes and orbital contents are non-suspicious. OTHER: None. IMPRESSION: Chronic appearing small vessel ischemic disease.  Cerebral atrophy. Electronically Signed   By: Ashley Royalty M.D.   On: 08/28/2016 22:40    Scheduled Meds: . lidocaine  1 patch Transdermal Q24H  . pantoprazole  40 mg Oral q morning - 10a  . piperacillin-tazobactam (ZOSYN)  IV  3.375 g Intravenous Q8H  . [START ON 08/30/2016] vancomycin  1,000 mg Intravenous Q24H  . venlafaxine XR  150 mg Oral Daily   Continuous Infusions:  Principal Problem:   Sepsis ([place]) Active Problems:   Pancytopenia ([place])   AML (acute myeloid leukemia) ([place])   Acute encephalopathy   Fall   Hypokalemia   Lactic acidosis    Time spent: 25 minutes    Barton Dubois  [facility] Pager [phone]. If 7PM-7AM, please contact night-coverage at [email or web address], password [facility] 08/29/2016, 4:50 PM  LOS: 1 day

## 2016-08-29 NOTE — Progress Notes (Signed)
[name]   DOB:25-Aug-1935   DU#:[id number]   CBJ#:[id number]   ONCOLOGY FOLLOW UP   Subjective: I am covering my partner Dr. Jana Hakim to see pt. The patient is sleeping, and her son provides her history. The patient fell on Monday 08/26/16 in her bathroom. She was in significant pain all the next day. The patient could not sit up or drink water on Wednesday 08/28/16. The patient has not eaten much in the last 2 days. Her son reports she has sores in her mouth. She was admitted yesterday for sepsis. She initially required BiPAP, currently on 4 L nasal cannula oxygen. Her vital signs stabilized.    Objective:  Vitals:   08/29/16 1400 08/29/16 1500  BP: (!) 124/58 133/67  Pulse:  100  Resp: (!) 23 (!) 26  Temp:      Body mass index is 22.12 kg/m.  Intake/Output Summary (Last 24 hours) at 08/29/16 1612 Last data filed at 08/29/16 1058  Gross per 24 hour  Intake              150 ml  Output                0 ml  Net              150 ml     Sclerae unicteric  Lungs (+) crackles in both lungs   Heart regular rate and rhythm  Abdomen benign  MSK no focal spinal tenderness, no peripheral edema    CBG (last 3)  No results for input(s): GLUCAP in the last 72 hours.   Labs:  Lab Results  Component Value Date   WBC 4.2 08/29/2016   HGB 8.0 (L) 08/29/2016   HCT 22.6 (L) 08/29/2016   MCV 89.7 08/29/2016   PLT 10 (LL) 08/29/2016   NEUTROABS 0.0 (L) 08/29/2016   CMP Latest Ref Rng & Units 08/29/2016 08/28/2016 08/28/2016  Glucose 65 - 99 mg/dL 102(H) 100(H) 99  BUN 6 - 20 mg/dL 28(H) 22(H) 22(H)  Creatinine 0.44 - 1.00 mg/dL 1.32(H) 1.15(H) 1.13(H)  Sodium 135 - 145 mmol/L 137 132(L) 133(L)  Potassium 3.5 - 5.1 mmol/L 4.6 3.0(L) 3.1(L)  Chloride 101 - 111 mmol/L 107 102 101  CO2 22 - 32 mmol/L 20(L) 20(L) 23  Calcium 8.9 - 10.3 mg/dL 7.8(L) 7.5(L) 8.2(L)  Total Protein 6.5 - 8.1 g/dL - 6.5 7.0  Total Bilirubin 0.3 - 1.2 mg/dL - 1.3(H) 1.2  Alkaline Phos 38 - 126 U/L - 99 122  AST  15 - 41 U/L - 37 30  ALT 14 - 54 U/L - 19 19     Urine Studies No results for input(s): UHGB, CRYS in the last 72 hours.  Invalid input(s): UACOL, UAPR, USPG, UPH, UTP, UGL, UKET, UBIL, UNIT, UROB, ULEU, UEPI, UWBC, [place], [place], [place], [place], [place]  Basic Metabolic Panel:  Recent Labs Lab 08/28/16 1745 08/28/16 2233 08/29/16 0410  NA 133* 132* 137  K 3.1* 3.0* 4.6  CL 101 102 107  CO2 23 20* 20*  GLUCOSE 99 100* 102*  BUN 22* 22* 28*  CREATININE 1.13* 1.15* 1.32*  CALCIUM 8.2* 7.5* 7.8*   GFR Estimated Creatinine Clearance: 35.4 mL/min (by C-G formula based on SCr of 1.32 mg/dL (H)). Liver Function Tests:  Recent Labs Lab 08/28/16 1745 08/28/16 2233  AST 30 37  ALT 19 19  ALKPHOS 122 99  BILITOT 1.2 1.3*  PROT 7.0 6.5  ALBUMIN 2.6* 2.1*   No results for input(s):  LIPASE, AMYLASE in the last 168 hours. No results for input(s): AMMONIA in the last 168 hours. Coagulation profile No results for input(s): INR, PROTIME in the last 168 hours.  CBC:  Recent Labs Lab 08/26/16 1058 08/28/16 1745 08/29/16 0410  WBC 3.9 4.7 4.2  NEUTROABS  --  0.0* 0.0*  HGB 7.1* 9.6* 8.0*  HCT 20.6* 27.5* 22.6*  MCV 91.2 89.0 89.7  PLT 2* 15* 10*   Cardiac Enzymes: No results for input(s): CKTOTAL, CKMB, CKMBINDEX, TROPONINI in the last 168 hours. BNP: Invalid input(s): POCBNP CBG: No results for input(s): GLUCAP in the last 168 hours. D-Dimer No results for input(s): DDIMER in the last 72 hours. Hgb A1c No results for input(s): HGBA1C in the last 72 hours. Lipid Profile No results for input(s): CHOL, HDL, LDLCALC, TRIG, CHOLHDL, LDLDIRECT in the last 72 hours. Thyroid function studies No results for input(s): TSH, T4TOTAL, T3FREE, THYROIDAB in the last 72 hours.  Invalid input(s): FREET3 Anemia work up No results for input(s): VITAMINB12, FOLATE, FERRITIN, TIBC, IRON, RETICCTPCT in the last 72 hours. Microbiology Recent Results (from the past 240 hour(s))  Blood  Culture (routine x 2)     Status: None (Preliminary result)   Collection Time: 08/28/16  5:30 PM  Result Value Ref Range Status   Specimen Description BLOOD LEFT WRIST  Final   Special Requests BOTTLES DRAWN AEROBIC ONLY 5CC  Final   Culture  Setup Time   Final    GRAM NEGATIVE RODS AEROBIC BOTTLE ONLY CRITICAL VALUE NOTED.  VALUE IS CONSISTENT WITH PREVIOUSLY REPORTED AND CALLED VALUE. Performed at [facility], [place] [address] [place], [place] [address]    Culture GRAM NEGATIVE RODS  Final   Report Status PENDING  Incomplete  Blood Culture (routine x 2)     Status: None (Preliminary result)   Collection Time: 08/28/16  5:39 PM  Result Value Ref Range Status   Specimen Description BLOOD RIGHT WRIST  Final   Special Requests BOTTLES DRAWN AEROBIC AND ANAEROBIC 5CC  Final   Culture  Setup Time   Final    GRAM NEGATIVE RODS IN BOTH AEROBIC AND ANAEROBIC BOTTLES Organism ID to follow CRITICAL RESULT CALLED TO, READ BACK BY AND VERIFIED WITH: J. Scherrie November Pharm.D. 11:15 08/29/16 ([email or web address]) Performed at [facility], [place] [address], [place],  [address]    Culture GRAM NEGATIVE RODS  Final   Report Status PENDING  Incomplete  Blood Culture ID Panel (Reflexed)     Status: Abnormal   Collection Time: 08/28/16  5:39 PM  Result Value Ref Range Status   Enterococcus species NOT DETECTED NOT DETECTED Final   Listeria monocytogenes NOT DETECTED NOT DETECTED Final   Staphylococcus species NOT DETECTED NOT DETECTED Final   Staphylococcus aureus NOT DETECTED NOT DETECTED Final   Streptococcus species NOT DETECTED NOT DETECTED Final   Streptococcus agalactiae NOT DETECTED NOT DETECTED Final   Streptococcus pneumoniae NOT DETECTED NOT DETECTED Final   Streptococcus pyogenes NOT DETECTED NOT DETECTED Final   Acinetobacter baumannii NOT DETECTED NOT DETECTED Final   Enterobacteriaceae species DETECTED (A) NOT DETECTED Final    Comment: Enterobacteriaceae represent a large family of  gram-negative bacteria, not a single organism. CRITICAL RESULT CALLED TO, READ BACK BY AND VERIFIED WITH: J. Scherrie November Pharm.D. 11:15 08/29/16 ([email or web address])    Enterobacter cloacae complex NOT DETECTED NOT DETECTED Final   Escherichia coli NOT DETECTED NOT DETECTED Final   Klebsiella oxytoca DETECTED (A) NOT DETECTED Final  Comment: CRITICAL RESULT CALLED TO, READ BACK BY AND VERIFIED WITH: J. Scherrie November Pharm.D. 11:15 08/29/16 ([email or web address])    Klebsiella pneumoniae NOT DETECTED NOT DETECTED Final   Proteus species NOT DETECTED NOT DETECTED Final   Serratia marcescens NOT DETECTED NOT DETECTED Final   Carbapenem resistance NOT DETECTED NOT DETECTED Final   Haemophilus influenzae NOT DETECTED NOT DETECTED Final   Neisseria meningitidis NOT DETECTED NOT DETECTED Final   Pseudomonas aeruginosa NOT DETECTED NOT DETECTED Final   Candida albicans NOT DETECTED NOT DETECTED Final   Candida glabrata NOT DETECTED NOT DETECTED Final   Candida krusei NOT DETECTED NOT DETECTED Final   Candida parapsilosis NOT DETECTED NOT DETECTED Final   Candida tropicalis NOT DETECTED NOT DETECTED Final    Comment: Performed at [facility], [place] [address], [place], [place] [address]      Studies:  Ct Abdomen Pelvis Wo Contrast  Result Date: 08/28/2016 CLINICAL DATA:  Altered mental status with fever. Daughter noticed blood in the patient's briefs. EXAM: CT ABDOMEN AND PELVIS WITHOUT CONTRAST TECHNIQUE: Multidetector CT imaging of the abdomen and pelvis was performed following the standard protocol without IV contrast. COMPARISON:  04/17/2010 pelvic CT FINDINGS: Lower chest: Borderline cardiomegaly without pericardial effusion. Large hiatal hernia. Patchy airspace opacities at each lung base. Pneumonia is not excluded. Tiny nodular densities noted in the left lower lobe measuring up to 5 mm. No pleural effusion or pneumothorax. Hepatobiliary: No focal liver abnormality is seen. No gallstones, gallbladder wall thickening, or  biliary dilatation. Pancreas: No pancreatic mass or ductal dilatation. Due to motion artifacts, assessment for pancreatic inflammation is compromised. Spleen: Normal in size without focal abnormality. Adrenals/Urinary Tract: No adrenal nodule. Mild left adrenal gland thickening. No nephrolithiasis or renal mass. No hydroureteronephrosis. Distended bladder without focal mural abnormality or calculus. Stomach/Bowel: Normal small bowel rotation. No bowel obstruction. Moderate colonic stool burden. There is left-sided colonic diverticulosis. The appendix is not visualized. No definite cecal inflammation however. Vascular/Lymphatic: Aorto bi-iliac atherosclerosis without aneurysm. No lymphadenopathy. Reproductive: Uterus and bilateral adnexa are unremarkable. Other: No abdominal wall hernia or abnormality. No abdominopelvic ascites. Musculoskeletal: Chronic insufficiency fracture of the right sacral ala is not well visualized on current exam. Osteoarthritis of the sacroiliac joints. Lower lumbar facet arthropathy. Dextroscoliosis of the thoracolumbar junction. No acute osseous appearing abnormality. IMPRESSION: 1. Large hiatal hernia. 2. Patchy airspace opacities within both visualized lower lobes. Pneumonia is not entirely excluded. Tiny 5 mm or less nodular densities in the medial left lower lobe adjacent atelectatic lung may be postinfectious or postinflammatory in etiology. Pulmonary nodules are not entirely excluded. No follow-up needed if patient is low-risk (and has no known or suspected primary neoplasm). Non-contrast chest CT can be considered in 12 months if patient is high-risk. This recommendation follows the consensus statement: Guidelines for Management of Incidental Pulmonary Nodules Detected on CT Images: From the Fleischner Society 2017; Radiology 2017; 284:228-243. 3. Colonic diverticulosis without acute diverticulitis. 4. Aorto bi-iliac atherosclerosis without aneurysm. Electronically Signed   By:  Ashley Royalty M.D.   On: 08/28/2016 22:51   Dg Chest 2 View  Result Date: 08/28/2016 CLINICAL DATA:  81 y/o  F; altered mental status and fever. EXAM: CHEST  2 VIEW COMPARISON:  06/01/2014 chest radiograph. FINDINGS: Stable cardiac silhouette given projection and technique. And aortic atherosclerosis with calcification. Left shoulder reverse arthroplasty partially visualized. No acute osseous abnormality. Left lower lobe consolidation and streaky airspace opacities in left mid lung zone. IMPRESSION: Left lower lobe consolidation likely represents  pneumonia. These results will be called to the ordering clinician or representative by the Radiologist Assistant, and communication documented in the PACS or zVision Dashboard. Electronically Signed   By: Kristine Garbe M.D.   On: 08/28/2016 18:01   Ct Head Wo Contrast  Result Date: 08/28/2016 CLINICAL DATA:  Altered mental status.  Nonverbal. EXAM: CT HEAD WITHOUT CONTRAST TECHNIQUE: Contiguous axial images were obtained from the base of the skull through the vertex without intravenous contrast. COMPARISON:  None. FINDINGS: Study is slightly limited by patient motion. BRAIN: There is sulcal and ventricular prominence consistent with superficial and central atrophy. No intraparenchymal hemorrhage, mass effect nor midline shift. Periventricular and subcortical white matter hypodensities consistent with chronic small vessel ischemic disease are identified. No acute large vascular territory infarcts. No abnormal extra-axial fluid collections. Basal cisterns are not effaced and midline. VASCULAR: Mild-to-moderate calcific atherosclerosis of the carotid siphons. SKULL: No skull fracture. No significant scalp soft tissue swelling. SINUSES/ORBITS: The mastoid air-cells are clear. The included paranasal sinuses are well-aerated.The included ocular globes and orbital contents are non-suspicious. OTHER: None. IMPRESSION: Chronic appearing small vessel ischemic disease.   Cerebral atrophy. Electronically Signed   By: Ashley Royalty M.D.   On: 08/28/2016 22:40    Assessment: 82 y.o.  woman with acute myeloid leukemia diagnosed by bone marrow biopsy 05/25/2015, relapsed after Azacytidine, on revlimid which has been held for a while due to her mucositis. She has pancytopenia and his blood transfusion-dependent.   1. Sepsis from HCAP, and GNR bacteriemia  2. AML  3. Pancytopenia, neutropenia fever  4. Acute hypoxic respiratory failure secondary to #1, improved  5. Metabolic encephalopathy  6. Hypokalemia   Plan:  -Patient is on broad spectrum antibiotics, with the hope that her condition will improve. -due to her advanced age and her immunosuppression from her AML, her overall prognosis is very poor  -I discussed the patient's treatment options with her son today, and I advised the patient's son that she is not a candidate for bone marrow transplant, although pt was hoping to do it.  -will hold on revlimid, I don't know every if she is able to resume  -continue supportive care, including blood and plt transfusion to keep Hb>8.0 and plt>10K  -pt is DNR/DNI -her son voiced good understanding of the overall very poor prognosis, due to her AML. Hospice was previously discussed with patient and her family, patient declined, although her family is very realistic and not against it. If her condition deteriorate, I will re-address the goal of care and hospice topic.  -I will follow up.   This document serves as a record of services personally performed by Truitt Merle, MD. It was created on her behalf by Maryla Morrow, a trained medical scribe. The creation of this record is based on the scribe's personal observations and the provider's statements to them. This document has been checked and approved by the attending provider.  Maryla Morrow 08/29/2016  4:12 PM

## 2016-08-30 ENCOUNTER — Other Ambulatory Visit: Payer: Self-pay

## 2016-08-30 DIAGNOSIS — N179 Acute kidney failure, unspecified: Secondary | ICD-10-CM

## 2016-08-30 DIAGNOSIS — N183 Chronic kidney disease, stage 3 (moderate): Secondary | ICD-10-CM

## 2016-08-30 LAB — URINE CULTURE: CULTURE: NO GROWTH

## 2016-08-30 LAB — CBC
HCT: 22.4 % — ABNORMAL LOW (ref 36.0–46.0)
Hemoglobin: 7.7 g/dL — ABNORMAL LOW (ref 12.0–15.0)
MCH: 32 pg (ref 26.0–34.0)
MCHC: 34.4 g/dL (ref 30.0–36.0)
MCV: 92.9 fL (ref 78.0–100.0)
PLATELETS: 6 10*3/uL — AB (ref 150–400)
RBC: 2.41 MIL/uL — ABNORMAL LOW (ref 3.87–5.11)
RDW: 17.5 % — AB (ref 11.5–15.5)
WBC: 6.9 10*3/uL (ref 4.0–10.5)

## 2016-08-30 LAB — BASIC METABOLIC PANEL
ANION GAP: 22 — AB (ref 5–15)
Anion gap: 17 — ABNORMAL HIGH (ref 5–15)
BUN: 44 mg/dL — ABNORMAL HIGH (ref 6–20)
BUN: 47 mg/dL — ABNORMAL HIGH (ref 6–20)
CALCIUM: 8.5 mg/dL — AB (ref 8.9–10.3)
CO2: 10 mmol/L — AB (ref 22–32)
CO2: 14 mmol/L — ABNORMAL LOW (ref 22–32)
CREATININE: 1.86 mg/dL — AB (ref 0.44–1.00)
Calcium: 8.1 mg/dL — ABNORMAL LOW (ref 8.9–10.3)
Chloride: 107 mmol/L (ref 101–111)
Chloride: 108 mmol/L (ref 101–111)
Creatinine, Ser: 1.83 mg/dL — ABNORMAL HIGH (ref 0.44–1.00)
GFR calc Af Amer: 29 mL/min — ABNORMAL LOW (ref >60)
GFR calc non Af Amer: 25 mL/min — ABNORMAL LOW (ref >60)
GFR, EST AFRICAN AMERICAN: 29 mL/min — AB (ref >60)
GFR, EST NON AFRICAN AMERICAN: 25 mL/min — AB (ref >60)
Glucose, Bld: 36 mg/dL — CL (ref 65–99)
Glucose, Bld: 109 mg/dL — ABNORMAL HIGH (ref 65–99)
Potassium: 4 mmol/L (ref 3.5–5.1)
Potassium: 3.7 mmol/L (ref 3.5–5.1)
Sodium: 139 mmol/L (ref 135–145)
Sodium: 139 mmol/L (ref 135–145)

## 2016-08-30 LAB — GLUCOSE, CAPILLARY
GLUCOSE-CAPILLARY: 89 mg/dL (ref 65–99)
GLUCOSE-CAPILLARY: 80 mg/dL (ref 65–99)
GLUCOSE-CAPILLARY: 79 mg/dL (ref 65–99)
Glucose-Capillary: 39 mg/dL — CL (ref 65–99)

## 2016-08-30 LAB — LACTIC ACID, PLASMA
LACTIC ACID, VENOUS: 10.7 mmol/L — AB (ref 0.5–1.9)
Lactic Acid, Venous: 15.4 mmol/L (ref 0.5–1.9)

## 2016-08-30 MED ORDER — AMINOCAPROIC ACID 500 MG PO TABS
500.0000 mg | ORAL_TABLET | Freq: Three times a day (TID) | ORAL | Status: DC
  Administered 2016-08-31 (×2): 500 mg via ORAL
  Filled 2016-08-30 (×10): qty 1

## 2016-08-30 MED ORDER — BOOST PLUS PO LIQD
237.0000 mL | Freq: Three times a day (TID) | ORAL | Status: DC
  Administered 2016-08-30: 237 mL via ORAL
  Filled 2016-08-30 (×9): qty 237

## 2016-08-30 MED ORDER — DEXTROSE 50 % IV SOLN
INTRAVENOUS | Status: AC
  Administered 2016-08-30: 25 mL
  Filled 2016-08-30: qty 50

## 2016-08-30 MED ORDER — SODIUM CHLORIDE 0.9 % IV BOLUS (SEPSIS)
1000.0000 mL | Freq: Once | INTRAVENOUS | Status: AC
  Administered 2016-08-30: 1000 mL via INTRAVENOUS

## 2016-08-30 MED ORDER — MAGIC MOUTHWASH W/LIDOCAINE
5.0000 mL | Freq: Four times a day (QID) | ORAL | Status: DC | PRN
  Administered 2016-08-31: 5 mL via ORAL
  Filled 2016-08-30 (×2): qty 5

## 2016-08-30 MED ORDER — ADULT MULTIVITAMIN W/MINERALS CH
1.0000 | ORAL_TABLET | Freq: Every day | ORAL | Status: DC
  Administered 2016-08-31: 1 via ORAL
  Filled 2016-08-30 (×2): qty 1

## 2016-08-30 MED ORDER — DEXTROSE-NACL 5-0.45 % IV SOLN
INTRAVENOUS | Status: DC
  Administered 2016-08-30 – 2016-09-02 (×4): via INTRAVENOUS

## 2016-08-30 NOTE — Progress Notes (Signed)
Pt resting comfortably on room air at this time.  Pt in no noted distress, Bipap not needed at this time.  RT to monitor and assess as needed.

## 2016-08-30 NOTE — Progress Notes (Signed)
CRITICAL VALUE ALERT  Critical value received:  Lactic acid 15.4  and glucose 36  Date of notification:  08/30/2016  Critical value read back:yes  Nurse who received alert: Abby  MD notified (1st page):  K. Schorr  Pt already given 25 cc D50, glucose within "normal" range. Provider ordered 1 L bolus.

## 2016-08-30 NOTE — Care Management Note (Signed)
Case Management Note  Patient Details  Name: [name] MRN: [id number] Date of Birth: 1936-01-23  Subjective/Objective:     PNA with low EWBC and [place], chemopatient               Action/Plan:Date:  August 30, 2016 Chart reviewed for concurrent status and case management needs. Will continue to follow patient progress. Discharge Planning: following for needs Expected discharge date: [id number] Velva Harman, BSN, [place], [place]   Expected Discharge Date:   (unknown)               Expected Discharge Plan:  Home/Self Care  In-House Referral:     Discharge planning Services     Post Acute Care Choice:    Choice offered to:     DME Arranged:    DME Agency:     HH Arranged:    [place] Agency:     Status of Service:  In process, will continue to follow  If discussed at Long Length of Stay Meetings, dates discussed:    Additional Comments:  Leeroy Cha, RN 08/30/2016, 10:08 AM

## 2016-08-30 NOTE — Progress Notes (Signed)
[name]   DOB:[birth year]   XN#:[id number]   URK#:[id number]   ONCOLOGY FOLLOW UP   Subjective: Pt is more alert today, off oxygen when I saw her in ICU, overall much improved, no active bleeding, daughter at bedside.   Objective:  Vitals:   08/30/16 1938 08/30/16 2000  BP:  (!) 109/54  Pulse:  91  Resp:  (!) 23  Temp: 97.4 F (36.3 C)     Body mass index is 22.12 kg/m.  Intake/Output Summary (Last 24 hours) at 08/30/16 2101 Last data filed at 08/30/16 1800  Gross per 24 hour  Intake          2398.25 ml  Output                0 ml  Net          2398.25 ml     Sclerae unicteric  Lungs (+) crackles in both lungs   Heart regular rate and rhythm  Abdomen benign  MSK no focal spinal tenderness, no peripheral edema    CBG (last 3)   Recent Labs  08/30/16 0509 08/30/16 0618 08/30/16 0746  GLUCAP 89 80 79     Labs:  Lab Results  Component Value Date   WBC 6.9 08/30/2016   HGB 7.7 (L) 08/30/2016   HCT 22.4 (L) 08/30/2016   MCV 92.9 08/30/2016   PLT 6 (LL) 08/30/2016   NEUTROABS 0.0 (L) 08/29/2016   CMP Latest Ref Rng & Units 08/30/2016 08/30/2016 08/29/2016  Glucose 65 - 99 mg/dL 109(H) 36(LL) 102(H)  BUN 6 - 20 mg/dL 47(H) 44(H) 28(H)  Creatinine 0.44 - 1.00 mg/dL 1.83(H) 1.86(H) 1.32(H)  Sodium 135 - 145 mmol/L 139 139 137  Potassium 3.5 - 5.1 mmol/L 3.7 4.0 4.6  Chloride 101 - 111 mmol/L 108 107 107  CO2 22 - 32 mmol/L 14(L) 10(L) 20(L)  Calcium 8.9 - 10.3 mg/dL 8.1(L) 8.5(L) 7.8(L)  Total Protein 6.5 - 8.1 g/dL - - -  Total Bilirubin 0.3 - 1.2 mg/dL - - -  Alkaline Phos 38 - 126 U/L - - -  AST 15 - 41 U/L - - -  ALT 14 - 54 U/L - - -     Urine Studies No results for input(s): UHGB, CRYS in the last 72 hours.  Invalid input(s): UACOL, UAPR, USPG, UPH, UTP, UGL, UKET, UBIL, UNIT, UROB, [place], UEPI, UWBC, [place], [place], [place], [place], [place]  Basic Metabolic Panel:  Recent Labs Lab 08/28/16 1745 08/28/16 2233 08/29/16 0410 08/30/16 0350  08/30/16 0801  NA 133* 132* 137 139 139  K 3.1* 3.0* 4.6 4.0 3.7  CL 101 102 107 107 108  CO2 23 20* 20* 10* 14*  GLUCOSE 99 100* 102* 36* 109*  BUN 22* 22* 28* 44* 47*  CREATININE 1.13* 1.15* 1.32* 1.86* 1.83*  CALCIUM 8.2* 7.5* 7.8* 8.5* 8.1*   GFR Estimated Creatinine Clearance: 25.6 mL/min (by C-G formula based on SCr of 1.83 mg/dL (H)). Liver Function Tests:  Recent Labs Lab 08/28/16 1745 08/28/16 2233  AST 30 37  ALT 19 19  ALKPHOS 122 99  BILITOT 1.2 1.3*  PROT 7.0 6.5  ALBUMIN 2.6* 2.1*   No results for input(s): LIPASE, AMYLASE in the last 168 hours. No results for input(s): AMMONIA in the last 168 hours. Coagulation profile No results for input(s): INR, PROTIME in the last 168 hours.  CBC:  Recent Labs Lab 08/26/16 1058 08/28/16 1745 08/29/16 0410 08/30/16 0350  WBC 3.9 4.7 4.2  6.9  NEUTROABS  --  0.0* 0.0*  --   HGB 7.1* 9.6* 8.0* 7.7*  HCT 20.6* 27.5* 22.6* 22.4*  MCV 91.2 89.0 89.7 92.9  PLT 2* 15* 10* 6*   Cardiac Enzymes: No results for input(s): CKTOTAL, CKMB, CKMBINDEX, TROPONINI in the last 168 hours. BNP: Invalid input(s): POCBNP CBG:  Recent Labs Lab 08/30/16 0453 08/30/16 0509 08/30/16 0618 08/30/16 0746  GLUCAP 39* 89 80 79   D-Dimer No results for input(s): DDIMER in the last 72 hours. Hgb A1c No results for input(s): HGBA1C in the last 72 hours. Lipid Profile No results for input(s): CHOL, HDL, LDLCALC, TRIG, CHOLHDL, LDLDIRECT in the last 72 hours. Thyroid function studies No results for input(s): TSH, T4TOTAL, T3FREE, THYROIDAB in the last 72 hours.  Invalid input(s): FREET3 Anemia work up No results for input(s): VITAMINB12, FOLATE, FERRITIN, TIBC, IRON, RETICCTPCT in the last 72 hours. Microbiology Recent Results (from the past 240 hour(s))  Blood Culture (routine x 2)     Status: Abnormal (Preliminary result)   Collection Time: 08/28/16  5:30 PM  Result Value Ref Range Status   Specimen Description BLOOD LEFT  WRIST  Final   Special Requests BOTTLES DRAWN AEROBIC ONLY 5CC  Final   Culture  Setup Time   Final    GRAM NEGATIVE RODS AEROBIC BOTTLE ONLY CRITICAL VALUE NOTED.  VALUE IS CONSISTENT WITH PREVIOUSLY REPORTED AND CALLED VALUE. Performed at [facility], [place] [address], [place], [place] [address]    Culture KLEBSIELLA OXYTOCA (A)  Final   Report Status PENDING  Incomplete  Blood Culture (routine x 2)     Status: Abnormal (Preliminary result)   Collection Time: 08/28/16  5:39 PM  Result Value Ref Range Status   Specimen Description BLOOD RIGHT WRIST  Final   Special Requests BOTTLES DRAWN AEROBIC AND ANAEROBIC 5CC  Final   Culture  Setup Time   Final    GRAM NEGATIVE RODS IN BOTH AEROBIC AND ANAEROBIC BOTTLES Organism ID to follow CRITICAL RESULT CALLED TO, READ BACK BY AND VERIFIED WITH: J. Scherrie November Pharm.D. 11:15 08/29/16 ([email or web address]) Performed at [facility], [place] [address] [place], [place] [address]    Culture KLEBSIELLA OXYTOCA (A)  Final   Report Status PENDING  Incomplete  Blood Culture ID Panel (Reflexed)     Status: Abnormal   Collection Time: 08/28/16  5:39 PM  Result Value Ref Range Status   Enterococcus species NOT DETECTED NOT DETECTED Final   Listeria monocytogenes NOT DETECTED NOT DETECTED Final   Staphylococcus species NOT DETECTED NOT DETECTED Final   Staphylococcus aureus NOT DETECTED NOT DETECTED Final   Streptococcus species NOT DETECTED NOT DETECTED Final   Streptococcus agalactiae NOT DETECTED NOT DETECTED Final   Streptococcus pneumoniae NOT DETECTED NOT DETECTED Final   Streptococcus pyogenes NOT DETECTED NOT DETECTED Final   Acinetobacter baumannii NOT DETECTED NOT DETECTED Final   Enterobacteriaceae species DETECTED (A) NOT DETECTED Final    Comment: Enterobacteriaceae represent a large family of gram-negative bacteria, not a single organism. CRITICAL RESULT CALLED TO, READ BACK BY AND VERIFIED WITH: J. Scherrie November Pharm.D. 11:15 08/29/16 ([email or web address])     Enterobacter cloacae complex NOT DETECTED NOT DETECTED Final   Escherichia coli NOT DETECTED NOT DETECTED Final   Klebsiella oxytoca DETECTED (A) NOT DETECTED Final    Comment: CRITICAL RESULT CALLED TO, READ BACK BY AND VERIFIED WITH: J. Scherrie November Pharm.D. 11:15 08/29/16 ([email or web address])    Klebsiella pneumoniae NOT DETECTED NOT DETECTED Final  Proteus species NOT DETECTED NOT DETECTED Final   Serratia marcescens NOT DETECTED NOT DETECTED Final   Carbapenem resistance NOT DETECTED NOT DETECTED Final   Haemophilus influenzae NOT DETECTED NOT DETECTED Final   Neisseria meningitidis NOT DETECTED NOT DETECTED Final   Pseudomonas aeruginosa NOT DETECTED NOT DETECTED Final   Candida albicans NOT DETECTED NOT DETECTED Final   Candida glabrata NOT DETECTED NOT DETECTED Final   Candida krusei NOT DETECTED NOT DETECTED Final   Candida parapsilosis NOT DETECTED NOT DETECTED Final   Candida tropicalis NOT DETECTED NOT DETECTED Final    Comment: Performed at [facility], [place] [address], [place], [place] [address]  Culture, Urine     Status: None   Collection Time: 08/28/16 10:33 PM  Result Value Ref Range Status   Specimen Description URINE, CLEAN CATCH  Final   Special Requests NONE  Final   Culture   Final    NO GROWTH Performed at [facility], [place] [address] [place], [place] [address]    Report Status 08/30/2016 FINAL  Final  MRSA PCR Screening     Status: None   Collection Time: 08/29/16 12:17 PM  Result Value Ref Range Status   MRSA by PCR NEGATIVE NEGATIVE Final    Comment:        The GeneXpert MRSA Assay (FDA approved for NASAL specimens only), is one component of a comprehensive MRSA colonization surveillance program. It is not intended to diagnose MRSA infection nor to guide or monitor treatment for MRSA infections.       Studies:  Ct Abdomen Pelvis Wo Contrast  Result Date: 08/28/2016 CLINICAL DATA:  Altered mental status with fever. Daughter noticed blood in the  patient's briefs. EXAM: CT ABDOMEN AND PELVIS WITHOUT CONTRAST TECHNIQUE: Multidetector CT imaging of the abdomen and pelvis was performed following the standard protocol without IV contrast. COMPARISON:  04/17/2010 pelvic CT FINDINGS: Lower chest: Borderline cardiomegaly without pericardial effusion. Large hiatal hernia. Patchy airspace opacities at each lung base. Pneumonia is not excluded. Tiny nodular densities noted in the left lower lobe measuring up to 5 mm. No pleural effusion or pneumothorax. Hepatobiliary: No focal liver abnormality is seen. No gallstones, gallbladder wall thickening, or biliary dilatation. Pancreas: No pancreatic mass or ductal dilatation. Due to motion artifacts, assessment for pancreatic inflammation is compromised. Spleen: Normal in size without focal abnormality. Adrenals/Urinary Tract: No adrenal nodule. Mild left adrenal gland thickening. No nephrolithiasis or renal mass. No hydroureteronephrosis. Distended bladder without focal mural abnormality or calculus. Stomach/Bowel: Normal small bowel rotation. No bowel obstruction. Moderate colonic stool burden. There is left-sided colonic diverticulosis. The appendix is not visualized. No definite cecal inflammation however. Vascular/Lymphatic: Aorto bi-iliac atherosclerosis without aneurysm. No lymphadenopathy. Reproductive: Uterus and bilateral adnexa are unremarkable. Other: No abdominal wall hernia or abnormality. No abdominopelvic ascites. Musculoskeletal: Chronic insufficiency fracture of the right sacral ala is not well visualized on current exam. Osteoarthritis of the sacroiliac joints. Lower lumbar facet arthropathy. Dextroscoliosis of the thoracolumbar junction. No acute osseous appearing abnormality. IMPRESSION: 1. Large hiatal hernia. 2. Patchy airspace opacities within both visualized lower lobes. Pneumonia is not entirely excluded. Tiny 5 mm or less nodular densities in the medial left lower lobe adjacent atelectatic lung may  be postinfectious or postinflammatory in etiology. Pulmonary nodules are not entirely excluded. No follow-up needed if patient is low-risk (and has no known or suspected primary neoplasm). Non-contrast chest CT can be considered in 12 months if patient is high-risk. This recommendation follows the  consensus statement: Guidelines for Management of Incidental Pulmonary Nodules Detected on CT Images: From the Fleischner Society 2017; Radiology 2017; 284:228-243. 3. Colonic diverticulosis without acute diverticulitis. 4. Aorto bi-iliac atherosclerosis without aneurysm. Electronically Signed   By: Ashley Royalty M.D.   On: 08/28/2016 22:51   Ct Head Wo Contrast  Result Date: 08/28/2016 CLINICAL DATA:  Altered mental status.  Nonverbal. EXAM: CT HEAD WITHOUT CONTRAST TECHNIQUE: Contiguous axial images were obtained from the base of the skull through the vertex without intravenous contrast. COMPARISON:  None. FINDINGS: Study is slightly limited by patient motion. BRAIN: There is sulcal and ventricular prominence consistent with superficial and central atrophy. No intraparenchymal hemorrhage, mass effect nor midline shift. Periventricular and subcortical white matter hypodensities consistent with chronic small vessel ischemic disease are identified. No acute large vascular territory infarcts. No abnormal extra-axial fluid collections. Basal cisterns are not effaced and midline. VASCULAR: Mild-to-moderate calcific atherosclerosis of the carotid siphons. SKULL: No skull fracture. No significant scalp soft tissue swelling. SINUSES/ORBITS: The mastoid air-cells are clear. The included paranasal sinuses are well-aerated.The included ocular globes and orbital contents are non-suspicious. OTHER: None. IMPRESSION: Chronic appearing small vessel ischemic disease.  Cerebral atrophy. Electronically Signed   By: Ashley Royalty M.D.   On: 08/28/2016 22:40    Assessment: 81 y.o. 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 She has pancytopenia and his blood transfusion-dependent.   1. Sepsis from HCAP, and klebsiella bacteriemia  2. AML  3. Pancytopenia, neutropenia fever  4. Acute hypoxic respiratory failure secondary to #1, improved  5. Metabolic encephalopathy  6. Hypokalemia   Plan:  -Patient's overall condition has improved, she is responding to antibiotics well -continue supportive care, including blood and plt transfusion to keep Hb>8.0 and plt>10K  -pt is DNR/DNI, all blood products need to be irradiated  -I updated her daughter about her condition - Hospice was previously discussed with patient and her family, patient declined, although her family is very realistic and not against it. If her condition deteriorate, re-address the goal of care and hospice topic.  -will follow up as needed this weekend. Dr. Jana Hakim will return on Monday    Truitt Merle, MD 08/30/2016  9:01 PM

## 2016-08-30 NOTE — Progress Notes (Signed)
[facility] PROGRESS NOTE  [name] FXT:[id number] DOB: [birth year] DOA: 08/28/2016 PCP: [name], FNP  Interim summary and HPI 81 y.o. 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 Found to have sepsis and neutropenic fever. Also with acute resp failure with hypoxia due to HCAP.                   Assessment/Plan: Sepsis secondary to HCAP and Klebsiella bacteremia --Continue coverage with zosyn for now --given neg MRSA PCR and available culture info, will stop vancomycin  --Continue Neutropenic precautions --Blood cultures growing Klebsiella Oxytoca, will follow sensitivity  --Urine streptococcal antigen neg --repeat lactic acid 10.7 --Flu screen neg --mentation much better --will advance diet to full liquid; follow response   Acute hypoxic respiratory failure secondary to sepsis, HCAP --off BiPAP now; so far well tolerated --will follow resp function status --Lasix 20mg  IV one time given after aggressive IVF's in ED; patient with signs of vascular congestion on x-ray and crackles on exam. No further lasix given elevated lactic acid and sepsis. --ok to provide maintenance fluids for now, will monitor --Turn q2h, PRN neb therapy --start flutter valve  Pancytopenia secondary to history of AML,now transfusion dependent --Neutropenic precautions (ANC zero on admission) --continue IV antibiotics and follow cultures results --Will give platelets transfusion for platelet count less than 10 (6 today 3/9) --PRBC's for Hgb less than 7.5 --STAT head CT and CT A/P without acute abnormality and no signs of bleeding. --notified Dr. Jana Hakim of admission; will follow oncology rec's --will resume amicar  Hypokalemia --will follow trend and replete further as needed --follow Mg level   Acute on chronic renal failure: appears to have  stage 3 renal failure base on GFR -will minimize nephrotoxic agents -maintenance fluid started -follow trend -pre-renal azotemia from dehydration as main cause; low perfusion with sepsis contributing   Code Status: DNR Family Communication: no family at bedside  Disposition Plan: to be determined. Prognosis is poor overall. Continue IV antibiotics; continue BIPAP as needed and if remains stable today, will move to medsurg. Will advance diet slowly, transfuse platelets, provide supportive care and resume home medication regimen.   Consultants:  Oncology   Procedures:  See below for x-ray reports   Antibiotics:  vanc 3/7>>3/9  zosyn 08/28/16  HPI/Subjective: Afebrile currently; breathing stable. No CP.  Off BIPAP since 4-5 pm  Objective: Vitals:   08/30/16 0800 08/30/16 0900  BP: (!) 117/59 (!) 110/51  Pulse:  92  Resp: 20 (!) 22  Temp: 98.4 F (36.9 C)     Intake/Output Summary (Last 24 hours) at 08/30/16 1024 Last data filed at 08/30/16 0900  Gross per 24 hour  Intake          1400.25 ml  Output                0 ml  Net          1400.25 ml   Filed Weights   08/29/16 1151  Weight: 66 kg (145 lb 8.1 oz)    Exam:   General: patient is AAOX3 currently; feeling tired and frustrated with her condition. no CP, reports breathing is stable. Off BIPAP since 4-5 pm on 3/8. No fever, no CP. Following commands and thirsty.  Cardiovascular: S1 and s2, no rubs or gallops   Respiratory: improved air movement, no wheezing. Not using accessory muscles currently;  diffuse rhonchi, no frank crackles heard.  Abdomen: soft, no guarding, positive BS  Musculoskeletal: no cyanosis or clubbing   Data Reviewed: Basic Metabolic Panel:  Recent Labs Lab 08/28/16 1745 08/28/16 2233 08/29/16 0410 08/30/16 0350 08/30/16 0801  NA 133* 132* 137 139 139  K 3.1* 3.0* 4.6 4.0 3.7  CL 101 102 107 107 108  CO2 23 20* 20* 10* 14*  GLUCOSE 99 100* 102* 36* 109*  BUN 22* 22* 28* 44*  47*  CREATININE 1.13* 1.15* 1.32* 1.86* 1.83*  CALCIUM 8.2* 7.5* 7.8* 8.5* 8.1*   Estimated Creatinine Clearance: 25.6 mL/min (by C-G formula based on SCr of 1.83 mg/dL (H)).  Liver Function Tests:  Recent Labs Lab 08/28/16 1745 08/28/16 2233  AST 30 37  ALT 19 19  ALKPHOS 122 99  BILITOT 1.2 1.3*  PROT 7.0 6.5  ALBUMIN 2.6* 2.1*   CBC:  Recent Labs Lab 08/26/16 1058 08/28/16 1745 08/29/16 0410 08/30/16 0350  WBC 3.9 4.7 4.2 6.9  NEUTROABS  --  0.0* 0.0*  --   HGB 7.1* 9.6* 8.0* 7.7*  HCT 20.6* 27.5* 22.6* 22.4*  MCV 91.2 89.0 89.7 92.9  PLT 2* 15* 10* 6*   CBG:  Recent Labs Lab 08/30/16 0453 08/30/16 0509 08/30/16 0618 08/30/16 0746  GLUCAP 39* 89 80 79    Recent Results (from the past 240 hour(s))  Blood Culture (routine x 2)     Status: None (Preliminary result)   Collection Time: 08/28/16  5:30 PM  Result Value Ref Range Status   Specimen Description BLOOD LEFT WRIST  Final   Special Requests BOTTLES DRAWN AEROBIC ONLY 5CC  Final   Culture  Setup Time   Final    GRAM NEGATIVE RODS AEROBIC BOTTLE ONLY CRITICAL VALUE NOTED.  VALUE IS CONSISTENT WITH PREVIOUSLY REPORTED AND CALLED VALUE. Performed at [facility], [place] [address] [place], [place] [address]    Culture GRAM NEGATIVE RODS  Final   Report Status PENDING  Incomplete  Blood Culture (routine x 2)     Status: None (Preliminary result)   Collection Time: 08/28/16  5:39 PM  Result Value Ref Range Status   Specimen Description BLOOD RIGHT WRIST  Final   Special Requests BOTTLES DRAWN AEROBIC AND ANAEROBIC 5CC  Final   Culture  Setup Time   Final    GRAM NEGATIVE RODS IN BOTH AEROBIC AND ANAEROBIC BOTTLES Organism ID to follow CRITICAL RESULT CALLED TO, READ BACK BY AND VERIFIED WITH: J. Scherrie November Pharm.D. 11:15 08/29/16 ([email or web address]) Performed at [facility], [place] [address] [place], [place] [address]    Culture GRAM NEGATIVE RODS  Final   Report Status PENDING  Incomplete  Blood  Culture ID Panel (Reflexed)     Status: Abnormal   Collection Time: 08/28/16  5:39 PM  Result Value Ref Range Status   Enterococcus species NOT DETECTED NOT DETECTED Final   Listeria monocytogenes NOT DETECTED NOT DETECTED Final   Staphylococcus species NOT DETECTED NOT DETECTED Final   Staphylococcus aureus NOT DETECTED NOT DETECTED Final   Streptococcus species NOT DETECTED NOT DETECTED Final   Streptococcus agalactiae NOT DETECTED NOT DETECTED Final   Streptococcus pneumoniae NOT DETECTED NOT DETECTED Final   Streptococcus pyogenes NOT DETECTED NOT DETECTED Final   Acinetobacter baumannii NOT DETECTED NOT DETECTED Final   Enterobacteriaceae species DETECTED (A) NOT DETECTED Final    Comment: Enterobacteriaceae represent a large family of gram-negative bacteria, not a single organism. CRITICAL RESULT CALLED TO,  READ BACK BY AND VERIFIED WITH: Melodye Ped Pharm.D. 11:15 08/29/16 ([email or web address])    Enterobacter cloacae complex NOT DETECTED NOT DETECTED Final   Escherichia coli NOT DETECTED NOT DETECTED Final   Klebsiella oxytoca DETECTED (A) NOT DETECTED Final    Comment: CRITICAL RESULT CALLED TO, READ BACK BY AND VERIFIED WITH: J. Scherrie November Pharm.D. 11:15 08/29/16 ([email or web address])    Klebsiella pneumoniae NOT DETECTED NOT DETECTED Final   Proteus species NOT DETECTED NOT DETECTED Final   Serratia marcescens NOT DETECTED NOT DETECTED Final   Carbapenem resistance NOT DETECTED NOT DETECTED Final   Haemophilus influenzae NOT DETECTED NOT DETECTED Final   Neisseria meningitidis NOT DETECTED NOT DETECTED Final   Pseudomonas aeruginosa NOT DETECTED NOT DETECTED Final   Candida albicans NOT DETECTED NOT DETECTED Final   Candida glabrata NOT DETECTED NOT DETECTED Final   Candida krusei NOT DETECTED NOT DETECTED Final   Candida parapsilosis NOT DETECTED NOT DETECTED Final   Candida tropicalis NOT DETECTED NOT DETECTED Final    Comment: Performed at [facility], [place] [address] [place], [place] [address]   Culture, Urine     Status: None   Collection Time: 08/28/16 10:33 PM  Result Value Ref Range Status   Specimen Description URINE, CLEAN CATCH  Final   Special Requests NONE  Final   Culture   Final    NO GROWTH Performed at [facility], [place] [address], [place], [place] [address]    Report Status 08/30/2016 FINAL  Final  MRSA PCR Screening     Status: None   Collection Time: 08/29/16 12:17 PM  Result Value Ref Range Status   MRSA by PCR NEGATIVE NEGATIVE Final    Comment:        The GeneXpert MRSA Assay (FDA approved for NASAL specimens only), is one component of a comprehensive MRSA colonization surveillance program. It is not intended to diagnose MRSA infection nor to guide or monitor treatment for MRSA infections.      Studies: Ct Abdomen Pelvis Wo Contrast  Result Date: 08/28/2016 CLINICAL DATA:  Altered mental status with fever. Daughter noticed blood in the patient's briefs. EXAM: CT ABDOMEN AND PELVIS WITHOUT CONTRAST TECHNIQUE: Multidetector CT imaging of the abdomen and pelvis was performed following the standard protocol without IV contrast. COMPARISON:  04/17/2010 pelvic CT FINDINGS: Lower chest: Borderline cardiomegaly without pericardial effusion. Large hiatal hernia. Patchy airspace opacities at each lung base. Pneumonia is not excluded. Tiny nodular densities noted in the left lower lobe measuring up to 5 mm. No pleural effusion or pneumothorax. Hepatobiliary: No focal liver abnormality is seen. No gallstones, gallbladder wall thickening, or biliary dilatation. Pancreas: No pancreatic mass or ductal dilatation. Due to motion artifacts, assessment for pancreatic inflammation is compromised. Spleen: Normal in size without focal abnormality. Adrenals/Urinary Tract: No adrenal nodule. Mild left adrenal gland thickening. No nephrolithiasis or renal mass. No hydroureteronephrosis. Distended bladder without focal mural abnormality or calculus. Stomach/Bowel: Normal small  bowel rotation. No bowel obstruction. Moderate colonic stool burden. There is left-sided colonic diverticulosis. The appendix is not visualized. No definite cecal inflammation however. Vascular/Lymphatic: Aorto bi-iliac atherosclerosis without aneurysm. No lymphadenopathy. Reproductive: Uterus and bilateral adnexa are unremarkable. Other: No abdominal wall hernia or abnormality. No abdominopelvic ascites. Musculoskeletal: Chronic insufficiency fracture of the right sacral ala is not well visualized on current exam. Osteoarthritis of the sacroiliac joints. Lower lumbar facet arthropathy. Dextroscoliosis of the thoracolumbar junction. No acute osseous appearing abnormality. IMPRESSION: 1. Large hiatal hernia. 2. Patchy airspace opacities  within both visualized lower lobes. Pneumonia is not entirely excluded. Tiny 5 mm or less nodular densities in the medial left lower lobe adjacent atelectatic lung may be postinfectious or postinflammatory in etiology. Pulmonary nodules are not entirely excluded. No follow-up needed if patient is low-risk (and has no known or suspected primary neoplasm). Non-contrast chest CT can be considered in 12 months if patient is high-risk. This recommendation follows the consensus statement: Guidelines for Management of Incidental Pulmonary Nodules Detected on CT Images: From the Fleischner Society 2017; Radiology 2017; 284:228-243. 3. Colonic diverticulosis without acute diverticulitis. 4. Aorto bi-iliac atherosclerosis without aneurysm. Electronically Signed   By: Ashley Royalty M.D.   On: 08/28/2016 22:51   Dg Chest 2 View  Result Date: 08/28/2016 CLINICAL DATA:  81 y/o  F; altered mental status and fever. EXAM: CHEST  2 VIEW COMPARISON:  06/01/2014 chest radiograph. FINDINGS: Stable cardiac silhouette given projection and technique. And aortic atherosclerosis with calcification. Left shoulder reverse arthroplasty partially visualized. No acute osseous abnormality. Left lower lobe  consolidation and streaky airspace opacities in left mid lung zone. IMPRESSION: Left lower lobe consolidation likely represents pneumonia. These results will be called to the ordering clinician or representative by the Radiologist Assistant, and communication documented in the PACS or zVision Dashboard. Electronically Signed   By: Kristine Garbe M.D.   On: 08/28/2016 18:01   Ct Head Wo Contrast  Result Date: 08/28/2016 CLINICAL DATA:  Altered mental status.  Nonverbal. EXAM: CT HEAD WITHOUT CONTRAST TECHNIQUE: Contiguous axial images were obtained from the base of the skull through the vertex without intravenous contrast. COMPARISON:  None. FINDINGS: Study is slightly limited by patient motion. BRAIN: There is sulcal and ventricular prominence consistent with superficial and central atrophy. No intraparenchymal hemorrhage, mass effect nor midline shift. Periventricular and subcortical white matter hypodensities consistent with chronic small vessel ischemic disease are identified. No acute large vascular territory infarcts. No abnormal extra-axial fluid collections. Basal cisterns are not effaced and midline. VASCULAR: Mild-to-moderate calcific atherosclerosis of the carotid siphons. SKULL: No skull fracture. No significant scalp soft tissue swelling. SINUSES/ORBITS: The mastoid air-cells are clear. The included paranasal sinuses are well-aerated.The included ocular globes and orbital contents are non-suspicious. OTHER: None. IMPRESSION: Chronic appearing small vessel ischemic disease.  Cerebral atrophy. Electronically Signed   By: Ashley Royalty M.D.   On: 08/28/2016 22:40    Scheduled Meds: . aminocaproic acid  500 mg Oral Q8H  . lidocaine  1 patch Transdermal Q24H  . pantoprazole  40 mg Oral q morning - 10a  . piperacillin-tazobactam (ZOSYN)  IV  3.375 g Intravenous Q8H  . venlafaxine XR  150 mg Oral Daily   Continuous Infusions: . dextrose 5 % and 0.45% NaCl 75 mL/hr at 08/30/16 0900     Principal Problem:   Severe sepsis (HCC) Active Problems:   Pancytopenia (HCC)   AML (acute myeloid leukemia) ([place])   Acute encephalopathy   Fall   Hypokalemia   Lactic acidosis   Community acquired pneumonia of left lung   HCAP (healthcare-associated pneumonia)   Bacteremia    Time spent: 69 minutes    Barton Dubois  [facility] Pager [phone]. If 7PM-7AM, please contact night-coverage at [email or web address], password [facility] 08/30/2016, 10:24 AM  LOS: 2 days

## 2016-08-30 NOTE — Progress Notes (Signed)
CRITICAL VALUE ALERT  Critical value received:  Lactic Acid: 10.7  Date of notification:  08/30/2016  Time of notification:  0842  Critical value read back:Yes.    Nurse who received alert:  Leanna Sato, RN  MD notified (1st page):  [place]  Time of first page: 0848  Time MD responded:  0900

## 2016-08-30 NOTE — Progress Notes (Addendum)
Initial Nutrition Assessment  DOCUMENTATION CODES:   Not applicable  INTERVENTION:  - Will order Boost Plus TID, each supplement provides 360 kcal and 14 grams of protein. - Will order daily multivitamin with minerals. - Continue to encourage PO intakes as tolerated. - RD will continue to monitor for needs  NUTRITION DIAGNOSIS:   Inadequate oral intake related to mouth pain, cancer and cancer related treatments, poor appetite as evidenced by energy intake < or equal to 50% for > or equal to 5 days, per patient/family report.  GOAL:   Patient will meet greater than or equal to 90% of their needs  MONITOR:   PO intake, Supplement acceptance, Diet advancement, Weight trends, Labs, I & O's  REASON FOR ASSESSMENT:   Malnutrition Screening Tool  ASSESSMENT:   81 y.o. 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  This morning, family noted that she was slow to get up and difficult to arouse.  Apparently she has not verbalized any specific complaints in her current condition, but she felt warm to touch and has had chills.  Family called 911; fever was not documented until arrival in the ED.  Pt seen for MST. BMI indicates normal weight status. Pt sleeping at this time with son and daughter at bedside (they provide all information). Pt was NPO until diet advanced to FLD today at 0930. Pt has had a few sips of iced tea, ice chips, a few bites of jello (which she had trouble swallowing and spit out) and a bite of orange sherbet (which she stated burned her throat). Family reports that pt did not eat or drink anything in the 2 days PTA. They state that she began treatment on a new chemo drug a few months ago which has led to taste alteration of bland taste, decreased smell sensation, and mouth sores which blister and bleed and lead pt to not want to eat.  She is able to taste salty and sour items the best.   At home pt would eat 1/2 of a [employer] breakfast burrito for breakfast, a few bites of something for lunch, and a few bites of dinner meal often prepared for her by family. Daughter bought Boost for home use and pt would drink at least a full bottle each day (she likes chocolate flavor). Family is unaware of any chewing or swallowing issues and were very surprised when she could not swallow jello today.   Unable to perform physical assessment at this time but will do so at follow-up and document findings at that time. Per chart review, pt weight has been stable since December. Family reports she had an appointment on Monday (3/5) and weighed 136-138 lbs at that time (consistent with weight in chart that date of 138 lbs) but that she was fully clothed and holding a cane when weight was obtained.   Medications reviewed; PRN magic mouthwash with lidocaine, 40 mg oral Protonix/day.  Labs reviewed; BUN: 47 mg/dL, creatinine: 1.83 mg/dL, Ca: 8 mg/dL, GFR: 25 mL/min.  IVF: D5-1/2 NS @ 75 mL/hr (306 kcal).    Diet Order:  Diet full liquid Room service appropriate? Yes; Fluid consistency: Thin  Skin:  Reviewed, no issues  Last BM:  3/8  Height:   Ht Readings from Last 1 Encounters:  08/29/16 5\' 8"  (1.727 m)    Weight:   Wt Readings from  Last 1 Encounters:  08/29/16 145 lb 8.1 oz (66 kg)    Ideal Body Weight:  63.64 kg  BMI:  Body mass index is 22.12 kg/m.  Estimated Nutritional Needs:   Kcal:  1980-2180 (30-33 kcal/kg)  Protein:  90-105 grams (~1.4-1.6 grams/kg)  Fluid:  >/= 2 L/day  EDUCATION NEEDS:   No education needs identified at this time    Jarome Matin, MS, RD, LDN, CNSC Inpatient Clinical Dietitian Pager # [phone] After hours/weekend pager # [phone]

## 2016-08-31 DIAGNOSIS — I214 Non-ST elevation (NSTEMI) myocardial infarction: Secondary | ICD-10-CM

## 2016-08-31 LAB — CBC WITH DIFFERENTIAL/PLATELET
BLASTS: 26 %
Band Neutrophils: 0 %
Basophils Absolute: 0 10*3/uL (ref 0.0–0.1)
Basophils Relative: 0 %
EOS PCT: 0 %
Eosinophils Absolute: 0 10*3/uL (ref 0.0–0.7)
HEMATOCRIT: 19.4 % — AB (ref 36.0–46.0)
HEMOGLOBIN: 6.9 g/dL — AB (ref 12.0–15.0)
Lymphocytes Relative: 72 %
Lymphs Abs: 1.2 10*3/uL (ref 0.7–4.0)
MCH: 31.4 pg (ref 26.0–34.0)
MCHC: 35.6 g/dL (ref 30.0–36.0)
MCV: 88.2 fL (ref 78.0–100.0)
MYELOCYTES: 0 %
Metamyelocytes Relative: 0 %
Monocytes Absolute: 0 10*3/uL — ABNORMAL LOW (ref 0.1–1.0)
Monocytes Relative: 2 %
NEUTROS PCT: 0 %
NRBC: 0 /100{WBCs}
Neutro Abs: 0 10*3/uL — ABNORMAL LOW (ref 1.7–7.7)
Other: 0 %
PROMYELOCYTES ABS: 0 %
Platelets: 28 10*3/uL — CL (ref 150–400)
RBC: 2.2 MIL/uL — AB (ref 3.87–5.11)
RDW: 17.4 % — ABNORMAL HIGH (ref 11.5–15.5)
WBC: 1.7 10*3/uL — AB (ref 4.0–10.5)

## 2016-08-31 LAB — BASIC METABOLIC PANEL
ANION GAP: 12 (ref 5–15)
ANION GAP: 11 (ref 5–15)
BUN: 56 mg/dL — AB (ref 6–20)
BUN: 55 mg/dL — ABNORMAL HIGH (ref 6–20)
CALCIUM: 8.1 mg/dL — AB (ref 8.9–10.3)
CO2: 17 mmol/L — ABNORMAL LOW (ref 22–32)
CO2: 16 mmol/L — AB (ref 22–32)
CREATININE: 1.77 mg/dL — AB (ref 0.44–1.00)
Calcium: 8 mg/dL — ABNORMAL LOW (ref 8.9–10.3)
Chloride: 110 mmol/L (ref 101–111)
Chloride: 110 mmol/L (ref 101–111)
Creatinine, Ser: 1.65 mg/dL — ABNORMAL HIGH (ref 0.44–1.00)
GFR calc Af Amer: 31 mL/min — ABNORMAL LOW (ref >60)
GFR calc Af Amer: 33 mL/min — ABNORMAL LOW (ref >60)
GFR calc non Af Amer: 29 mL/min — ABNORMAL LOW (ref >60)
GFR, EST NON AFRICAN AMERICAN: 26 mL/min — AB (ref >60)
GLUCOSE: 148 mg/dL — AB (ref 65–99)
GLUCOSE: 122 mg/dL — AB (ref 65–99)
POTASSIUM: 3.4 mmol/L — AB (ref 3.5–5.1)
Potassium: 3.1 mmol/L — ABNORMAL LOW (ref 3.5–5.1)
Sodium: 139 mmol/L (ref 135–145)
Sodium: 137 mmol/L (ref 135–145)

## 2016-08-31 LAB — CULTURE, BLOOD (ROUTINE X 2)

## 2016-08-31 LAB — MAGNESIUM
MAGNESIUM: 1.6 mg/dL — AB (ref 1.7–2.4)
Magnesium: 2.1 mg/dL (ref 1.7–2.4)

## 2016-08-31 LAB — LACTIC ACID, PLASMA
Lactic Acid, Venous: 6.7 mmol/L (ref 0.5–1.9)
Lactic Acid, Venous: 6.5 mmol/L (ref 0.5–1.9)

## 2016-08-31 LAB — TROPONIN I
Troponin I: 9.19 ng/mL (ref <0.03)
Troponin I: 6.89 ng/mL (ref <0.03)
Troponin I: 4.7 ng/mL (ref <0.03)

## 2016-08-31 LAB — PREPARE RBC (CROSSMATCH)

## 2016-08-31 MED ORDER — SODIUM CHLORIDE 0.9 % IV SOLN
2.0000 g | Freq: Once | INTRAVENOUS | Status: AC
  Administered 2016-08-31: 2 g via INTRAVENOUS
  Filled 2016-08-31: qty 20

## 2016-08-31 MED ORDER — POTASSIUM CHLORIDE 10 MEQ/100ML IV SOLN
10.0000 meq | INTRAVENOUS | Status: AC
  Administered 2016-08-31 (×3): 10 meq via INTRAVENOUS
  Filled 2016-08-31 (×3): qty 100

## 2016-08-31 MED ORDER — SODIUM CHLORIDE 0.9 % IV SOLN
Freq: Once | INTRAVENOUS | Status: AC
  Administered 2016-08-31: 11:00:00 via INTRAVENOUS

## 2016-08-31 MED ORDER — POTASSIUM CHLORIDE 10 MEQ/100ML IV SOLN
10.0000 meq | INTRAVENOUS | Status: AC
Start: 2016-08-31 — End: 2016-08-31
  Administered 2016-08-31 (×3): 10 meq via INTRAVENOUS
  Filled 2016-08-31 (×3): qty 100

## 2016-08-31 MED ORDER — SODIUM CHLORIDE 0.9 % IV SOLN: 30.0000 meq | Freq: Once | INTRAVENOUS | Status: DC

## 2016-08-31 MED ORDER — POTASSIUM CHLORIDE 2 MEQ/ML IV SOLN: 30.0000 meq | Freq: Once | INTRAVENOUS | Status: DC

## 2016-08-31 MED ORDER — FUROSEMIDE 10 MG/ML IJ SOLN
20.0000 mg | Freq: Once | INTRAMUSCULAR | Status: AC
  Administered 2016-08-31: 20 mg via INTRAVENOUS
  Filled 2016-08-31: qty 2

## 2016-08-31 MED ORDER — MAGNESIUM SULFATE 2 GM/50ML IV SOLN
2.0000 g | Freq: Once | INTRAVENOUS | Status: AC
  Administered 2016-08-31: 2 g via INTRAVENOUS
  Filled 2016-08-31: qty 50

## 2016-08-31 NOTE — Evaluation (Signed)
Clinical/Bedside Swallow Evaluation Patient Details  Name: [name] MRN: [id number] Date of Birth: 1936/02/03  Today's Date: 08/31/2016 Time: SLP Start Time (ACUTE ONLY): 65 SLP Stop Time (ACUTE ONLY): 1549 SLP Time Calculation (min) (ACUTE ONLY): 25 min  Past Medical History:  Past Medical History:  Diagnosis Date  . Anemia   . Anxiety   . Arthritis    "hand joints; hips; back" (05/23/2015)  . Cancer ([place])    dec. 2016-leukemia  . Chronic lower back pain   . Depression   . GERD (gastroesophageal reflux disease)   . History of blood transfusion 05/23/2015   "Hgb 5.9"  . Hypertension   . Shortness of breath dyspnea    with exertion   Past Surgical History:  Past Surgical History:  Procedure Laterality Date  . Bone spur     R thigh  . CATARACT EXTRACTION W/ INTRAOCULAR LENS  IMPLANT, BILATERAL  ~ 2005  . DILATION AND CURETTAGE OF UTERUS    . ESOPHAGOGASTRODUODENOSCOPY (EGD) WITH PROPOFOL N/A 11/08/2014   Procedure: ESOPHAGOGASTRODUODENOSCOPY (EGD) WITH PROPOFOL;  Surgeon: Garlan Fair, MD;  Location: [facility];  Service: Endoscopy;  Laterality: N/A;  . JOINT REPLACEMENT    . TONSILLECTOMY  ~ 1950  . TOTAL KNEE ARTHROPLASTY  04/27/2012   Procedure: TOTAL KNEE ARTHROPLASTY;  Surgeon: Mauri Pole, MD;  Location: [facility];  Service: Orthopedics;  Laterality: Right;  . 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 Found to have sepsis and neutropenic fever. Also with acute resp failure with hypoxia due to HCAP. Chest x ray with left lower lobe consolidation likely represents pneumonia.  ST to evaluate swallow function   Assessment / Plan / Recommendation Clinical Impression  Pt presents with moderate to severe oral and suspected pharyngeal dysphagia of multifactoral etiology including deconditioning from previous chemotherapy, decreased mentation, and decreased respiratory status. Pt alert however restless during BSE with frequent moaning, nursing notified. Oral cavity with significant dryness and lesions dispersed throughout. Attempted oral care with minimal improvements and pt discomfort noted. Pt with severe bilateral oral motor weakness, likely deconditioning from cancer tx,  resulting in bilateral anterior spillage of thin liquids and poor bolus propulsion of all consistencies with expectoration of puree consistencies noted upon spontaneous exhalation. Consulted with MD regarding concern for significant aspiration risk with PO diet and concern for inability to maintain adequate nutrition by mouth. MD reports plans for pallliative encounter to determine goals of care. Continue full liquid diet this time with known aspiration risks and medicine crushed in puree. ST to continue to monitor for goals of care  SLP Visit Diagnosis: Dysphagia, unspecified (R13.10)    Aspiration Risk  Severe aspiration risk;Risk for inadequate nutrition/hydration    Diet Recommendation   Full liquid diet   Medication Administration: Crushed with puree    Other  Recommendations Oral Care Recommendations: Oral care BID;Staff/trained caregiver to provide oral care   Follow up Recommendations 24 hour supervision/assistance      Frequency and Duration min 2x/week  1 week       Prognosis Prognosis for Safe Diet Advancement: Guarded Barriers to Reach Goals: Severity of deficits  Swallow Study   General Date of Onset: 08/28/16 HPI: Pt is a 81 y.o.woman with a 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 Found to have sepsis and neutropenic fever. Also with acute resp failure with hypoxia due to HCAP. Chest x ray with left lower lobe consolidation likely represents pneumonia. ST to evaluate swallow function Type of Study: Bedside Swallow Evaluation Previous Swallow Assessment: no prior hx of dysphagia  Diet Prior to this Study: Thin liquids Temperature Spikes Noted: No Respiratory Status: Room air History of Recent Intubation: No Behavior/Cognition: Other (Comment);Distractible;Requires cueing (restless) Oral Cavity Assessment: Dry;Other (comment) (disperse lesions with dark coating on tongue and oral musc) Oral Care Completed by SLP: Yes (though minimal improvements and pain during oral care noted) Oral Cavity - Dentition: Adequate natural dentition Vision: Impaired for self-feeding Self-Feeding Abilities: Total assist Patient Positioning: Upright in bed;Postural control interferes with function Baseline Vocal Quality: Low vocal intensity Volitional Cough: Weak Volitional Swallow: Unable to elicit    Oral/Motor/Sensory Function Overall Oral Motor/Sensory Function: Severe impairment Lingual Strength: Reduced   Ice Chips Ice chips: Impaired Presentation: Spoon Oral Phase Impairments: Impaired mastication;Reduced lingual movement/coordination;Reduced labial seal;Poor awareness of bolus Oral Phase Functional Implications: Right anterior spillage;Left anterior spillage;Prolonged oral transit;Oral residue;Oral holding Pharyngeal Phase Impairments: Suspected delayed Swallow   Thin Liquid Thin Liquid: Impaired Presentation: Cup Oral Phase Impairments: Reduced labial seal;Reduced lingual movement/coordination;Impaired mastication Oral Phase Functional Implications: Right anterior spillage;Left anterior spillage;Prolonged oral transit Pharyngeal  Phase Impairments: Suspected delayed Swallow;Multiple swallows;Cough - Delayed    Nectar Thick Nectar Thick  Liquid: Not tested   Honey Thick Honey Thick Liquid: Not tested   Puree Puree: Impaired Presentation: Spoon Oral Phase Impairments: Reduced labial seal;Impaired mastication;Reduced lingual movement/coordination;Other (comment) (bolus expectoration upon exhale 2/2 poor labial seal) Oral Phase Functional Implications: Right anterior spillage;Left anterior spillage;Oral holding;Oral residue Pharyngeal Phase Impairments: Unable to trigger swallow   Solid   GO   Solid: Not tested       Arvil Chaco MA, [place]    Levi Aland 08/31/2016,4:16 PM

## 2016-08-31 NOTE — Progress Notes (Signed)
CRITICAL VALUE ALERT  Critical value received:  Lactic acid 6.7, Troponin-9.19   Date of notification:  08/31/16   Time of notification:  0115  Critical value read back:Yes.    Nurse who received alert:  S.Yanisa Goodgame,RN  MD notified (1st page):  Dr. Tamala Julian  Time of first page:  0123  MD notified (2nd page):  Time of second page:  Responding MD:  Dr. Tamala Julian  Time MD responded:  [phone]

## 2016-08-31 NOTE — Progress Notes (Signed)
CRITICAL VALUE ALERT  Critical value received: Hgb 6.9  Date of notification:  08/31/16  Time of notification:  4373  Critical value read back:Yes.    Nurse who received alert:  S.Anaalicia Reimann,RN  MD notified (1st page):  Dr. Tamala Julian  Time of first page:  0510  MD notified (2nd page):  Time of second page:  Responding MD:  Dr. Faythe Casa  Time MD responded:  [phone]

## 2016-08-31 NOTE — Progress Notes (Signed)
[facility] PROGRESS NOTE  [name] INO:[id number] DOB: [birth year] DOA: 08/28/2016 PCP: [name], FNP  Interim summary and HPI 81 y.o. 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 Found to have sepsis and neutropenic fever. Also with acute resp failure with hypoxia due to HCAP.                   Assessment/Plan: Sepsis secondary to HCAP and Klebsiella bacteremia --Continue coverage with zosyn for now --given neg MRSA PCR and available culture info, vancomycin discontinued --Continue Neutropenic precautions (neutrophils continue to be 0.0) --Blood cultures growing Klebsiella Oxytoca, will follow sensitivity  --Urine streptococcal antigen neg --repeat lactic acid 15.4 >>10.7 >>6.5 --Flu screen neg --mentation much better --will advance diet to full liquid; follow clinical response and continue supportive care --SPL to assess her before advancing diet further.  Acute hypoxic respiratory failure secondary to sepsis, HCAP --off BiPAP now for 40 hours; so far well tolerated --will follow resp function status --Lasix 20mg  IV one time given after aggressive IVF's in ED; patient with signs of vascular congestion on x-ray and crackles on exam. No further lasix given elevated lactic acid and sepsis. --will continue to provide maintenance fluids for now, will monitor --Turn q2h, PRN neb therapy --continue/encourage flutter valve --continue abx's for PNA  Pancytopenia secondary to history of AML,now transfusion dependent --Neutropenic precautions (ANC zero on admission and continue to be zero) --continue IV antibiotics and follow cultures results --after 1 unit of platelets platelets up to 28K; will monitor trend --will transfuse 2 units of PRBC's, Hgb 6.9 --STAT head CT and CT A/P without acute abnormality and no signs of  bleeding. --will follow oncology rec's (Dr. Burr Medico and Dr. Jana Hakim on board) --will resume amicar  Hypokalemia --will follow trend and replete further as needed --follow Mg level   Acute on chronic renal failure: appears to have stage 3 renal failure base on GFR -will minimize nephrotoxic agents -continue maintenance fluid  -follow trend -pre-renal azotemia from dehydration as main cause; low perfusion with sepsis contributing  Elevated troponin/non sustained SVT -was discussed with cardiology service overnight, recommendations to follow troponin trend -patient with most likely type II NSTEMI secondary to severe sepsis. Profound anemia contributing. -also worsening renal function contributing to abnormal troponin -patient denies CP and endorses SOB continue improving -will check 2-D echo -patient very frail and probably not amenable for cardiac intervention.  Code Status: DNR Family Communication: no family at bedside  Disposition Plan: to be determined. Prognosis is poor overall. Continue IV antibiotics; continue BIPAP as needed and if remains stable today, will move to telemetry. Will continue advancing diet slowly, transfuse PRBC's/platelets as needed, continue supportive care and resume home medication regimen.   Consultants:  Oncology   Procedures:  See below for x-ray reports   Antibiotics:  vanc 3/7>>3/9  zosyn 08/28/16  HPI/Subjective: Afebrile currently; breathing stable. No CP.  Overnight with episode of non-sustained SVT. Also found to have significant elevated troponin. No nausea or vomiting.  Objective: Vitals:   08/31/16 0600 08/31/16 0700  BP: 117/71 (!) 113/58  Pulse: 88 86  Resp: 15 (!) 22  Temp:      Intake/Output Summary (Last 24 hours) at 08/31/16 0856 Last data filed at 08/31/16 0600  Gross per 24 hour  Intake             2173 ml  Output  0 ml  Net             2173 ml   Filed Weights   08/29/16 1151  Weight: 66 kg (145 lb  8.1 oz)    Exam:   General: patient is AAOX3 currently, even more somnolent today than yesterday. Patient is tired and fatigued. No CP, reports breathing is stable. Off BIPAP for almost 40 hours now. No fever, no CP. Following commands appropriately.  Cardiovascular: S1 and s2, no rubs or gallops   Respiratory: improved air movement, no wheezing. Not using accessory muscles currently; diffuse rhonchi, no frank crackles heard.  Abdomen: soft, no guarding, positive BS  Musculoskeletal: no cyanosis or clubbing   Data Reviewed: Basic Metabolic Panel:  Recent Labs Lab 08/29/16 0410 08/30/16 0350 08/30/16 0801 08/30/16 2319 08/31/16 0346  NA 137 139 139 139 137  K 4.6 4.0 3.7 3.1* 3.4*  CL 107 107 108 110 110  CO2 20* 10* 14* 17* 16*  GLUCOSE 102* 36* 109* 148* 122*  BUN 28* 44* 47* 56* 55*  CREATININE 1.32* 1.86* 1.83* 1.77* 1.65*  CALCIUM 7.8* 8.5* 8.1* 8.1* 8.0*  MG  --   --   --  1.6* 2.1   Estimated Creatinine Clearance: 28.3 mL/min (by C-G formula based on SCr of 1.65 mg/dL (H)).  Liver Function Tests:  Recent Labs Lab 08/28/16 1745 08/28/16 2233  AST 30 37  ALT 19 19  ALKPHOS 122 99  BILITOT 1.2 1.3*  PROT 7.0 6.5  ALBUMIN 2.6* 2.1*   CBC:  Recent Labs Lab 08/26/16 1058 08/28/16 1745 08/29/16 0410 08/30/16 0350 08/31/16 0346  WBC 3.9 4.7 4.2 6.9 1.7*  NEUTROABS  --  0.0* 0.0*  --  0.0*  HGB 7.1* 9.6* 8.0* 7.7* 6.9*  HCT 20.6* 27.5* 22.6* 22.4* 19.4*  MCV 91.2 89.0 89.7 92.9 88.2  PLT 2* 15* 10* 6* 28*   CBG:  Recent Labs Lab 08/30/16 0453 08/30/16 0509 08/30/16 0618 08/30/16 0746  GLUCAP 39* 89 80 79    Recent Results (from the past 240 hour(s))  Blood Culture (routine x 2)     Status: Abnormal   Collection Time: 08/28/16  5:30 PM  Result Value Ref Range Status   Specimen Description BLOOD LEFT WRIST  Final   Special Requests BOTTLES DRAWN AEROBIC ONLY 5CC  Final   Culture  Setup Time   Final    GRAM NEGATIVE RODS AEROBIC BOTTLE  ONLY CRITICAL VALUE NOTED.  VALUE IS CONSISTENT WITH PREVIOUSLY REPORTED AND CALLED VALUE.    Culture (A)  Final    KLEBSIELLA OXYTOCA SUSCEPTIBILITIES PERFORMED ON PREVIOUS CULTURE WITHIN THE LAST 5 DAYS. Performed at [facility], [place] [address] [place], [place] [address]    Report Status 08/31/2016 FINAL  Final  Blood Culture (routine x 2)     Status: Abnormal   Collection Time: 08/28/16  5:39 PM  Result Value Ref Range Status   Specimen Description BLOOD RIGHT WRIST  Final   Special Requests BOTTLES DRAWN AEROBIC AND ANAEROBIC 5CC  Final   Culture  Setup Time   Final    GRAM NEGATIVE RODS IN BOTH AEROBIC AND ANAEROBIC BOTTLES CRITICAL RESULT CALLED TO, READ BACK BY AND VERIFIED WITH: J. Scherrie November Pharm.D. 11:15 08/29/16 ([email or web address]) Performed at [facility], [place] [address], [place], [place] [address]    Culture KLEBSIELLA OXYTOCA (A)  Final   Report Status 08/31/2016 FINAL  Final   Organism ID, Bacteria KLEBSIELLA OXYTOCA  Final  Susceptibility   Klebsiella oxytoca - MIC*    AMPICILLIN >=32 RESISTANT Resistant     CEFAZOLIN 8 SENSITIVE Sensitive     CEFEPIME <=1 SENSITIVE Sensitive     CEFTAZIDIME <=1 SENSITIVE Sensitive     CEFTRIAXONE <=1 SENSITIVE Sensitive     CIPROFLOXACIN <=0.25 SENSITIVE Sensitive     GENTAMICIN <=1 SENSITIVE Sensitive     IMIPENEM <=0.25 SENSITIVE Sensitive     TRIMETH/SULFA <=20 SENSITIVE Sensitive     AMPICILLIN/SULBACTAM 16 INTERMEDIATE Intermediate     PIP/TAZO <=4 SENSITIVE Sensitive     Extended ESBL NEGATIVE Sensitive     * KLEBSIELLA OXYTOCA  Blood Culture ID Panel (Reflexed)     Status: Abnormal   Collection Time: 08/28/16  5:39 PM  Result Value Ref Range Status   Enterococcus species NOT DETECTED NOT DETECTED Final   Listeria monocytogenes NOT DETECTED NOT DETECTED Final   Staphylococcus species NOT DETECTED NOT DETECTED Final   Staphylococcus aureus NOT DETECTED NOT DETECTED Final   Streptococcus species NOT DETECTED NOT  DETECTED Final   Streptococcus agalactiae NOT DETECTED NOT DETECTED Final   Streptococcus pneumoniae NOT DETECTED NOT DETECTED Final   Streptococcus pyogenes NOT DETECTED NOT DETECTED Final   Acinetobacter baumannii NOT DETECTED NOT DETECTED Final   Enterobacteriaceae species DETECTED (A) NOT DETECTED Final    Comment: Enterobacteriaceae represent a large family of gram-negative bacteria, not a single organism. CRITICAL RESULT CALLED TO, READ BACK BY AND VERIFIED WITH: J. Scherrie November Pharm.D. 11:15 08/29/16 ([email or web address])    Enterobacter cloacae complex NOT DETECTED NOT DETECTED Final   Escherichia coli NOT DETECTED NOT DETECTED Final   Klebsiella oxytoca DETECTED (A) NOT DETECTED Final    Comment: CRITICAL RESULT CALLED TO, READ BACK BY AND VERIFIED WITH: J. Scherrie November Pharm.D. 11:15 08/29/16 ([email or web address])    Klebsiella pneumoniae NOT DETECTED NOT DETECTED Final   Proteus species NOT DETECTED NOT DETECTED Final   Serratia marcescens NOT DETECTED NOT DETECTED Final   Carbapenem resistance NOT DETECTED NOT DETECTED Final   Haemophilus influenzae NOT DETECTED NOT DETECTED Final   Neisseria meningitidis NOT DETECTED NOT DETECTED Final   Pseudomonas aeruginosa NOT DETECTED NOT DETECTED Final   Candida albicans NOT DETECTED NOT DETECTED Final   Candida glabrata NOT DETECTED NOT DETECTED Final   Candida krusei NOT DETECTED NOT DETECTED Final   Candida parapsilosis NOT DETECTED NOT DETECTED Final   Candida tropicalis NOT DETECTED NOT DETECTED Final    Comment: Performed at [facility], [place] [address] [place], [place] [address]  Culture, Urine     Status: None   Collection Time: 08/28/16 10:33 PM  Result Value Ref Range Status   Specimen Description URINE, CLEAN CATCH  Final   Special Requests NONE  Final   Culture   Final    NO GROWTH Performed at [facility], [place] [address], [place], [place] [address]    Report Status 08/30/2016 FINAL  Final  MRSA PCR Screening     Status: None    Collection Time: 08/29/16 12:17 PM  Result Value Ref Range Status   MRSA by PCR NEGATIVE NEGATIVE Final    Comment:        The GeneXpert MRSA Assay (FDA approved for NASAL specimens only), is one component of a comprehensive MRSA colonization surveillance program. It is not intended to diagnose MRSA infection nor to guide or monitor treatment for MRSA infections.      Studies: No results found.  Scheduled Meds: . sodium chloride  Intravenous Once  . aminocaproic acid  500 mg Oral Q8H  . furosemide  20 mg Intravenous Once  . lactose free nutrition  237 mL Oral TID WC  . lidocaine  1 patch Transdermal Q24H  . multivitamin with minerals  1 tablet Oral Daily  . pantoprazole  40 mg Oral q morning - 10a  . piperacillin-tazobactam (ZOSYN)  IV  3.375 g Intravenous Q8H  . potassium chloride  10 mEq Intravenous Q1 Hr x 3  . venlafaxine XR  150 mg Oral Daily   Continuous Infusions: . dextrose 5 % and 0.45% NaCl 75 mL/hr at 08/30/16 2010    Principal Problem:   Severe sepsis ([place]) Active Problems:   Pancytopenia ([place])   AML (acute myeloid leukemia) ([facility])   Acute encephalopathy   Fall   Hypokalemia   Lactic acidosis   Community acquired pneumonia of left lung   HCAP (healthcare-associated pneumonia)   Bacteremia    Time spent: 83 minutes    Barton Dubois  [facility] Pager [phone]. If 7PM-7AM, please contact night-coverage at [email or web address], password [facility] 08/31/2016, 8:56 AM  LOS: 3 days

## 2016-08-31 NOTE — Plan of Care (Signed)
Problem: Nutrition: Goal: Adequate nutrition will be maintained Outcome: Not Progressing Cont to have difficulty swallowing, poor PO intake

## 2016-08-31 NOTE — Progress Notes (Addendum)
Had 16 beat run of SVT ovenight. Checked electrolytes and cardiac enzymes. Currently, replacing electrolytes as needed. Cardiology consulted after troponin seen to be elevated to 9.19. Patient with out chest pain complaints. Cards question Type II NSTEMI 2/2 severe sepsis and recommended trending enzymes at this time.

## 2016-08-31 NOTE — Progress Notes (Signed)
CRITICAL VALUE ALERT  Critical value received:  Lactic acid-6.5  Date of notification:  08/31/16  Time of notification:  0530  Critical value read back:Yes.    Nurse who received alert:  S.Deniss Wormley,RN  MD notified (1st page):   Time of first page:    MD notified (2nd page):  Time of second page:  Responding MD:    Time MD responded:

## 2016-09-01 ENCOUNTER — Inpatient Hospital Stay (HOSPITAL_COMMUNITY): Payer: Medicare Other

## 2016-09-01 DIAGNOSIS — E43 Unspecified severe protein-calorie malnutrition: Secondary | ICD-10-CM

## 2016-09-01 DIAGNOSIS — R7989 Other specified abnormal findings of blood chemistry: Secondary | ICD-10-CM

## 2016-09-01 LAB — CBC
HCT: 30.5 % — ABNORMAL LOW (ref 36.0–46.0)
Hemoglobin: 10.9 g/dL — ABNORMAL LOW (ref 12.0–15.0)
MCH: 30.8 pg (ref 26.0–34.0)
MCHC: 35.7 g/dL (ref 30.0–36.0)
MCV: 86.2 fL (ref 78.0–100.0)
PLATELETS: 18 10*3/uL — AB (ref 150–400)
RBC: 3.54 MIL/uL — ABNORMAL LOW (ref 3.87–5.11)
RDW: 16.3 % — ABNORMAL HIGH (ref 11.5–15.5)
WBC: 3.3 10*3/uL — ABNORMAL LOW (ref 4.0–10.5)

## 2016-09-01 LAB — BASIC METABOLIC PANEL
Anion gap: 13 (ref 5–15)
BUN: 58 mg/dL — AB (ref 6–20)
CHLORIDE: 113 mmol/L — AB (ref 101–111)
CO2: 16 mmol/L — ABNORMAL LOW (ref 22–32)
CREATININE: 1.49 mg/dL — AB (ref 0.44–1.00)
Calcium: 8.9 mg/dL (ref 8.9–10.3)
GFR calc Af Amer: 38 mL/min — ABNORMAL LOW (ref >60)
GFR, EST NON AFRICAN AMERICAN: 32 mL/min — AB (ref >60)
GLUCOSE: 156 mg/dL — AB (ref 65–99)
Potassium: 3.3 mmol/L — ABNORMAL LOW (ref 3.5–5.1)
SODIUM: 142 mmol/L (ref 135–145)

## 2016-09-01 LAB — ECHOCARDIOGRAM COMPLETE
HEIGHTINCHES: 68 in
WEIGHTICAEL: 2328.06 [oz_av]

## 2016-09-01 LAB — LACTIC ACID, PLASMA: LACTIC ACID, VENOUS: 4.8 mmol/L — AB (ref 0.5–1.9)

## 2016-09-01 NOTE — Progress Notes (Signed)
[facility] PROGRESS NOTE  TENESSA MARSEE HYW:[id number] DOB: 16-Feb-1936 DOA: 08/28/2016 PCP: [name], FNP  Interim summary and HPI 81 y.o. 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 Found to have sepsis and neutropenic fever. Also with acute resp failure with hypoxia due to HCAP.                   Assessment/Plan: Sepsis secondary to HCAP and Klebsiella bacteremia --Continue coverage with zosyn for now --given neg MRSA PCR and available culture info, vancomycin discontinued --Continue Neutropenic precautions (neutrophils continue to be 0.0) --Blood cultures growing Klebsiella Oxytoca, will follow sensitivity  --Urine streptococcal antigen neg --repeat lactic acid 15.4 >>10.7 >>6.5>> labs pending today --Flu screen neg --overall sepsis features better; but patient with overall poor prognosis (now having great difficulty tolerating PO's' and with poor nutrition at baseline) --will advance diet to full liquid; follow clinical response and continue supportive care --SPL to assess her before advancing diet further.  Acute hypoxic respiratory failure secondary to sepsis, HCAP --off BiPAP now for 60 hours; so far well tolerated --will follow resp function status --Lasix 20mg  IV one time given after aggressive IVF's in ED; patient with signs of vascular congestion on x-ray and crackles on exam. No further lasix given elevated lactic acid and sepsis. --will continue to provide maintenance fluids for now, will monitor for fluid overload --Turn q2h, PRN neb therapy --continue/encourage flutter valve --continue abx's for PNA  Pancytopenia secondary to history of AML,now transfusion dependent --Neutropenic precautions (ANC zero on admission and continue to be zero) --continue IV antibiotics and follow cultures results --after 1 unit of  platelets platelets up to 28K; will monitor trend --will transfuse 2 units of PRBC's, Hgb 6.9 --STAT head CT and CT A/P without acute abnormality and no signs of bleeding. --will follow oncology rec's (Dr. Burr Medico and Dr. Jana Hakim on board) --will resume amicar (if able to take PO)  Hypokalemia --will follow trend and replete further as needed --follow Mg level   Acute on chronic renal failure: appears to have stage 3 renal failure base on GFR -will minimize nephrotoxic agents -continue maintenance fluid  -follow trend -pre-renal azotemia from dehydration as main cause; low perfusion with sepsis contributing  Elevated troponin/non sustained SVT -was discussed with cardiology service, recommendations to follow troponin trend and to check 2-D echo -patient with most likely type II NSTEMI secondary to severe sepsis. Profound anemia contributing. -also worsening renal function contributing to abnormal troponin -patient denies CP and her breathing has remained stable overall  -will follow 2-D echo -patient very frail and not amenable for cardiac intervention anyway.  Severe protein calorie malnutrition  -will follow nutritional rec's for feeding supplements -Patient having trouble swallowing now and at high risk for aspiration   Code Status: DNR Family Communication: no family at bedside  Disposition Plan: to be determined. Prognosis is poor overall. Continue IV antibiotics; continue BIPAP as needed. Given decrease in mentation and assistance will monitor closely in stepdown. Very high risk for aspiration; no diet titration. Continue PRBC's/platelets transfusion as needed, continue supportive care and follow oncology rec's in am.   Consultants:  Oncology   Procedures:  See below for x-ray reports   Antibiotics:  vanc 3/7>>3/9  zosyn 08/28/16  HPI/Subjective: Afebrile currently; breathing stable overall. No CP.  Overnight complaining of pain and agitation; received some  morphine. Very somnolent and lethargic  today. No nausea or vomiting.  Objective: Vitals:   09/01/16 0500 09/01/16 0800  BP:    Pulse: (!) 101   Resp: (!) 24   Temp: 97.4 F (36.3 C) (!) 96.1 F (35.6 C)    Intake/Output Summary (Last 24 hours) at 09/01/16 0910 Last data filed at 09/01/16 0500  Gross per 24 hour  Intake             2380 ml  Output                1 ml  Net             2379 ml   Filed Weights   08/29/16 1151  Weight: 66 kg (145 lb 8.1 oz)    Exam:   General: patient is lethargic and somnolent; moving four limbs, but not following much commands. Received morphine for agitation and discomfort overnight. No CP. No fever. Patient using O2 supplementation (3-4L [place]) O2 and has sat of 93-95%  Cardiovascular: S1 and s2, no rubs or gallops   Respiratory: moving good air, no wheezing. Not using accessory muscles currently; diffuse rhonchi, no frank crackles heard.  Abdomen: soft, no guarding, positive BS  Musculoskeletal: no cyanosis or clubbing   Data Reviewed: Basic Metabolic Panel:  Recent Labs Lab 08/30/16 0350 08/30/16 0801 08/30/16 2319 08/31/16 0346 09/01/16 0331  NA 139 139 139 137 142  K 4.0 3.7 3.1* 3.4* 3.3*  CL 107 108 110 110 113*  CO2 10* 14* 17* 16* 16*  GLUCOSE 36* 109* 148* 122* 156*  BUN 44* 47* 56* 55* 58*  CREATININE 1.86* 1.83* 1.77* 1.65* 1.49*  CALCIUM 8.5* 8.1* 8.1* 8.0* 8.9  MG  --   --  1.6* 2.1  --    Estimated Creatinine Clearance: 31.4 mL/min (by C-G formula based on SCr of 1.49 mg/dL (H)).  Liver Function Tests:  Recent Labs Lab 08/28/16 1745 08/28/16 2233  AST 30 37  ALT 19 19  ALKPHOS 122 99  BILITOT 1.2 1.3*  PROT 7.0 6.5  ALBUMIN 2.6* 2.1*   CBC:  Recent Labs Lab 08/28/16 1745 08/29/16 0410 08/30/16 0350 08/31/16 0346 09/01/16 0331  WBC 4.7 4.2 6.9 1.7* 3.3*  NEUTROABS 0.0* 0.0*  --  0.0*  --   HGB 9.6* 8.0* 7.7* 6.9* 10.9*  HCT 27.5* 22.6* 22.4* 19.4* 30.5*  MCV 89.0 89.7 92.9 88.2 86.2  PLT  15* 10* 6* 28* 18*   CBG:  Recent Labs Lab 08/30/16 0453 08/30/16 0509 08/30/16 0618 08/30/16 0746  GLUCAP 39* 89 80 79    Recent Results (from the past 240 hour(s))  Blood Culture (routine x 2)     Status: Abnormal   Collection Time: 08/28/16  5:30 PM  Result Value Ref Range Status   Specimen Description BLOOD LEFT WRIST  Final   Special Requests BOTTLES DRAWN AEROBIC ONLY 5CC  Final   Culture  Setup Time   Final    GRAM NEGATIVE RODS AEROBIC BOTTLE ONLY CRITICAL VALUE NOTED.  VALUE IS CONSISTENT WITH PREVIOUSLY REPORTED AND CALLED VALUE.    Culture (A)  Final    KLEBSIELLA OXYTOCA SUSCEPTIBILITIES PERFORMED ON PREVIOUS CULTURE WITHIN THE LAST 5 DAYS. Performed at [name], [place] [address] [place], [place] [address]    Report Status 08/31/2016 FINAL  Final  Blood Culture (routine x 2)     Status: Abnormal   Collection Time: 08/28/16  5:39 PM  Result Value Ref Range Status   Specimen  Description BLOOD RIGHT WRIST  Final   Special Requests BOTTLES DRAWN AEROBIC AND ANAEROBIC 5CC  Final   Culture  Setup Time   Final    GRAM NEGATIVE RODS IN BOTH AEROBIC AND ANAEROBIC BOTTLES CRITICAL RESULT CALLED TO, READ BACK BY AND VERIFIED WITH: J. Scherrie November Pharm.D. 11:15 08/29/16 ([email or web address]) Performed at [facility], [place] [address] [place], [place] [address]    Culture KLEBSIELLA OXYTOCA (A)  Final   Report Status 08/31/2016 FINAL  Final   Organism ID, Bacteria KLEBSIELLA OXYTOCA  Final      Susceptibility   Klebsiella oxytoca - MIC*    AMPICILLIN >=32 RESISTANT Resistant     CEFAZOLIN 8 SENSITIVE Sensitive     CEFEPIME <=1 SENSITIVE Sensitive     CEFTAZIDIME <=1 SENSITIVE Sensitive     CEFTRIAXONE <=1 SENSITIVE Sensitive     CIPROFLOXACIN <=0.25 SENSITIVE Sensitive     GENTAMICIN <=1 SENSITIVE Sensitive     IMIPENEM <=0.25 SENSITIVE Sensitive     TRIMETH/SULFA <=20 SENSITIVE Sensitive     AMPICILLIN/SULBACTAM 16 INTERMEDIATE Intermediate     PIP/TAZO <=4  SENSITIVE Sensitive     Extended ESBL NEGATIVE Sensitive     * KLEBSIELLA OXYTOCA  Blood Culture ID Panel (Reflexed)     Status: Abnormal   Collection Time: 08/28/16  5:39 PM  Result Value Ref Range Status   Enterococcus species NOT DETECTED NOT DETECTED Final   Listeria monocytogenes NOT DETECTED NOT DETECTED Final   Staphylococcus species NOT DETECTED NOT DETECTED Final   Staphylococcus aureus NOT DETECTED NOT DETECTED Final   Streptococcus species NOT DETECTED NOT DETECTED Final   Streptococcus agalactiae NOT DETECTED NOT DETECTED Final   Streptococcus pneumoniae NOT DETECTED NOT DETECTED Final   Streptococcus pyogenes NOT DETECTED NOT DETECTED Final   Acinetobacter baumannii NOT DETECTED NOT DETECTED Final   Enterobacteriaceae species DETECTED (A) NOT DETECTED Final    Comment: Enterobacteriaceae represent a large family of gram-negative bacteria, not a single organism. CRITICAL RESULT CALLED TO, READ BACK BY AND VERIFIED WITH: J. Scherrie November Pharm.D. 11:15 08/29/16 ([email or web address])    Enterobacter cloacae complex NOT DETECTED NOT DETECTED Final   Escherichia coli NOT DETECTED NOT DETECTED Final   Klebsiella oxytoca DETECTED (A) NOT DETECTED Final    Comment: CRITICAL RESULT CALLED TO, READ BACK BY AND VERIFIED WITH: J. Scherrie November Pharm.D. 11:15 08/29/16 ([email or web address])    Klebsiella pneumoniae NOT DETECTED NOT DETECTED Final   Proteus species NOT DETECTED NOT DETECTED Final   Serratia marcescens NOT DETECTED NOT DETECTED Final   Carbapenem resistance NOT DETECTED NOT DETECTED Final   Haemophilus influenzae NOT DETECTED NOT DETECTED Final   Neisseria meningitidis NOT DETECTED NOT DETECTED Final   Pseudomonas aeruginosa NOT DETECTED NOT DETECTED Final   Candida albicans NOT DETECTED NOT DETECTED Final   Candida glabrata NOT DETECTED NOT DETECTED Final   Candida krusei NOT DETECTED NOT DETECTED Final   Candida parapsilosis NOT DETECTED NOT DETECTED Final   Candida tropicalis NOT DETECTED NOT DETECTED  Final    Comment: Performed at [facility], [place] [address], [place], [place] [address]  Culture, Urine     Status: None   Collection Time: 08/28/16 10:33 PM  Result Value Ref Range Status   Specimen Description URINE, CLEAN CATCH  Final   Special Requests NONE  Final   Culture   Final    NO GROWTH Performed at [facility], [place] [address], [place], [place] [address]    Report Status  08/30/2016 FINAL  Final  MRSA PCR Screening     Status: None   Collection Time: 08/29/16 12:17 PM  Result Value Ref Range Status   MRSA by PCR NEGATIVE NEGATIVE Final    Comment:        The GeneXpert MRSA Assay (FDA approved for NASAL specimens only), is one component of a comprehensive MRSA colonization surveillance program. It is not intended to diagnose MRSA infection nor to guide or monitor treatment for MRSA infections.      Studies: No results found.  Scheduled Meds: . aminocaproic acid  500 mg Oral Q8H  . lactose free nutrition  237 mL Oral TID WC  . lidocaine  1 patch Transdermal Q24H  . multivitamin with minerals  1 tablet Oral Daily  . pantoprazole  40 mg Oral q morning - 10a  . piperacillin-tazobactam (ZOSYN)  IV  3.375 g Intravenous Q8H  . venlafaxine XR  150 mg Oral Daily   Continuous Infusions: . dextrose 5 % and 0.45% NaCl 75 mL/hr at 08/31/16 1716    Principal Problem:   Severe sepsis ([place]) Active Problems:   Pancytopenia ([facility])   AML (acute myeloid leukemia) ([place])   Acute encephalopathy   Fall   Hypokalemia   Lactic acidosis   Community acquired pneumonia of left lung   HCAP (healthcare-associated pneumonia)   Bacteremia    Time spent: 86 minutes    Barton Dubois  [facility] Pager [phone]. If 7PM-7AM, please contact night-coverage at [email or web address], password [facility] 09/01/2016, 9:10 AM  LOS: 4 days

## 2016-09-01 NOTE — Progress Notes (Signed)
*  PRELIMINARY RESULTS* Echocardiogram 2D Echocardiogram has been performed.  [name] 09/01/2016, 10:32 AM

## 2016-09-01 NOTE — Progress Notes (Signed)
CRITICAL VALUE ALERT  Critical value received:  Lactic Acid 4.8  Date of notification:  09-01-16  Time of notification:  0375  Critical value read back:Yes.    Nurse who received alert:  Mendel Corning, RN  MD notified (1st page):  [place]  Time of first page: 3  MD notified (2nd page):  Time of second page:  Responding MD:    Time MD responded:

## 2016-09-01 NOTE — Progress Notes (Signed)
Pharmacy Antibiotic Follow-up Note  [name] is a 81 y.o. year-old female admitted on 08/28/2016 with pneumonia and sepsis in the setting of profound neutropenia secondary to AML.   She is currently on D#5 Zosyn with pharmacy dosing assistance.    Blood cultures +Klebsiella oxytoca.  She continues on broad-spectrum antibiotics due to neutropenia.  Discussed plan with Dr Dyann Kief today.   He plans to discuss care with oncology on Monday and narrow antibiotics if appropriate from their standpoint.    Renal function >70ml/min.   Assessment/Plan: Continue Zosyn 3.375 grams IV q8h (4-hour infusion) F/U clinical picture- consider narrow to Rocephin 2gm IV q24h if OK with oncology.   Temp (24hrs), Avg:96.9 F (36.1 C), Min:96.1 F (35.6 C), Max:97.4 F (36.3 C)   Recent Labs Lab 08/28/16 1745 08/29/16 0410 08/30/16 0350 08/31/16 0346 09/01/16 0331  WBC 4.7 4.2 6.9 1.7* 3.3*     Recent Labs Lab 08/30/16 0350 08/30/16 0801 08/30/16 2319 08/31/16 0346 09/01/16 0331  CREATININE 1.86* 1.83* 1.77* 1.65* 1.49*   Estimated Creatinine Clearance: 31.4 mL/min (by C-G formula based on SCr of 1.49 mg/dL (H)).    Allergies  Allergen Reactions  . Cozaar [Losartan] Other (See Comments)    Cause pt to lose her taste for foods  . Codeine Itching and Other (See Comments)    Makes feel crazy  PT STATES SHE ALSO CAN NOT TAKE THE SYNTHETIC CODEINES  . Hydrocodone Itching    Tolerates with benadryl  . Oxycodone Itching  . Tetanus Toxoids Swelling and Rash   Antimicrobials this admission: 3/7 Zosyn >>  3/7 Vancomycin >> 3/9  Levels/dose changes this admission: 3/8 Vanco reduced to 1 gram q24h due to SCr bump to 1.32.  Microbiology results: 3/7 Blood: Klebsiella oxytoca (pan-sens except Amp/unasyn) 3/7 BCID: Klebsiella oxytoca 3/7 Influenza A/B: neg/neg 3/7 S.pneumo UAg: negative 3/7 Urine: NG-F 3/8 MRSA PCR: negative  Thank you for allowing pharmacy to be a part of this  patient's care.  Netta Cedars, PharmD, BCPS Pager: [phone] 09/01/2016  12:44 PM

## 2016-09-02 ENCOUNTER — Other Ambulatory Visit: Payer: Medicare Other

## 2016-09-02 ENCOUNTER — Ambulatory Visit: Payer: Medicare Other | Admitting: Adult Health

## 2016-09-02 DIAGNOSIS — D693 Immune thrombocytopenic purpura: Secondary | ICD-10-CM

## 2016-09-02 DIAGNOSIS — I21A1 Myocardial infarction type 2: Secondary | ICD-10-CM

## 2016-09-02 LAB — TYPE AND SCREEN
ABO/RH(D): O POS
Antibody Screen: NEGATIVE
UNIT DIVISION: 0
UNIT DIVISION: 0

## 2016-09-02 LAB — BPAM RBC
Blood Product Expiration Date: 201804022359
Blood Product Expiration Date: 201804022359
ISSUE DATE / TIME: 201803101048
ISSUE DATE / TIME: 201803101411
UNIT TYPE AND RH: 5100
Unit Type and Rh: 5100

## 2016-09-02 LAB — CBC
HCT: 31 % — ABNORMAL LOW (ref 36.0–46.0)
Hemoglobin: 11.2 g/dL — ABNORMAL LOW (ref 12.0–15.0)
MCH: 31.2 pg (ref 26.0–34.0)
MCHC: 36.1 g/dL — ABNORMAL HIGH (ref 30.0–36.0)
MCV: 86.4 fL (ref 78.0–100.0)
PLATELETS: 6 10*3/uL — AB (ref 150–400)
RBC: 3.59 MIL/uL — AB (ref 3.87–5.11)
RDW: 16.9 % — AB (ref 11.5–15.5)
WBC: 3.6 10*3/uL — AB (ref 4.0–10.5)

## 2016-09-02 LAB — BASIC METABOLIC PANEL
ANION GAP: 11 (ref 5–15)
BUN: 54 mg/dL — ABNORMAL HIGH (ref 6–20)
CALCIUM: 8.8 mg/dL — AB (ref 8.9–10.3)
CO2: 18 mmol/L — ABNORMAL LOW (ref 22–32)
Chloride: 116 mmol/L — ABNORMAL HIGH (ref 101–111)
Creatinine, Ser: 1.25 mg/dL — ABNORMAL HIGH (ref 0.44–1.00)
GFR, EST AFRICAN AMERICAN: 46 mL/min — AB (ref >60)
GFR, EST NON AFRICAN AMERICAN: 40 mL/min — AB (ref >60)
Glucose, Bld: 152 mg/dL — ABNORMAL HIGH (ref 65–99)
Potassium: 2.9 mmol/L — ABNORMAL LOW (ref 3.5–5.1)
SODIUM: 145 mmol/L (ref 135–145)

## 2016-09-02 LAB — PREPARE PLATELET PHERESIS: Unit division: 0

## 2016-09-02 LAB — BPAM PLATELET PHERESIS
Blood Product Expiration Date: 201803101116
ISSUE DATE / TIME: 201803091224
Unit Type and Rh: 7300

## 2016-09-02 LAB — LACTIC ACID, PLASMA: Lactic Acid, Venous: 4.7 mmol/L (ref 0.5–1.9)

## 2016-09-02 MED ORDER — ONDANSETRON HCL 4 MG/2ML IJ SOLN
4.0000 mg | Freq: Four times a day (QID) | INTRAMUSCULAR | Status: DC | PRN
Start: 2016-09-02 — End: 2016-09-02

## 2016-09-02 MED ORDER — ACETAMINOPHEN 650 MG RE SUPP: 650.0000 mg | Freq: Four times a day (QID) | RECTAL | Status: DC | PRN

## 2016-09-02 MED ORDER — POLYVINYL ALCOHOL 1.4 % OP SOLN
1.0000 [drp] | Freq: Four times a day (QID) | OPHTHALMIC | Status: DC | PRN
  Filled 2016-09-02: qty 15

## 2016-09-02 MED ORDER — SODIUM CHLORIDE 0.9 % IV SOLN: 30.0000 meq | Freq: Once | INTRAVENOUS | Status: DC

## 2016-09-02 MED ORDER — HALOPERIDOL LACTATE 5 MG/ML IJ SOLN: 0.5000 mg | INTRAMUSCULAR | Status: DC | PRN

## 2016-09-02 MED ORDER — ACETAMINOPHEN 325 MG PO TABS: 650.0000 mg | ORAL_TABLET | Freq: Four times a day (QID) | ORAL | Status: DC | PRN

## 2016-09-02 MED ORDER — ONDANSETRON 4 MG PO TBDP: 4.0000 mg | ORAL_TABLET | Freq: Four times a day (QID) | ORAL | Status: DC | PRN

## 2016-09-02 MED ORDER — MORPHINE SULFATE (PF) 4 MG/ML IV SOLN
1.0000 mg | INTRAVENOUS | Status: DC | PRN
  Administered 2016-09-02: 1 mg via INTRAVENOUS
  Filled 2016-09-02 (×2): qty 1

## 2016-09-02 MED ORDER — GLYCOPYRROLATE 1 MG PO TABS
1.0000 mg | ORAL_TABLET | ORAL | Status: DC | PRN
Start: 2016-09-02 — End: 2016-09-02
  Filled 2016-09-02: qty 1

## 2016-09-02 MED ORDER — HALOPERIDOL 0.5 MG PO TABS
0.5000 mg | ORAL_TABLET | ORAL | Status: DC | PRN
  Filled 2016-09-02: qty 1

## 2016-09-02 MED ORDER — GLYCOPYRROLATE 0.2 MG/ML IJ SOLN
0.2000 mg | INTRAMUSCULAR | Status: DC | PRN
  Filled 2016-09-02: qty 1

## 2016-09-02 MED ORDER — LORAZEPAM 2 MG/ML IJ SOLN
1.0000 mg | INTRAMUSCULAR | Status: DC | PRN
  Administered 2016-09-02 – 2016-09-03 (×2): 1 mg via INTRAVENOUS
  Filled 2016-09-02 (×2): qty 1

## 2016-09-02 MED ORDER — BIOTENE DRY MOUTH MT LIQD: 15.0000 mL | OROMUCOSAL | Status: DC | PRN

## 2016-09-02 MED ORDER — POTASSIUM CHLORIDE 10 MEQ/100ML IV SOLN
10.0000 meq | INTRAVENOUS | Status: AC
  Administered 2016-09-02 (×3): 10 meq via INTRAVENOUS
  Filled 2016-09-02 (×4): qty 100

## 2016-09-02 MED ORDER — DEXTROSE 5 % IV SOLN
2.0000 g | INTRAVENOUS | Status: DC
  Administered 2016-09-02: 2 g via INTRAVENOUS
  Filled 2016-09-02: qty 2

## 2016-09-02 MED ORDER — BISACODYL 10 MG RE SUPP: 10.0000 mg | Freq: Every day | RECTAL | Status: DC | PRN

## 2016-09-02 MED ORDER — HALOPERIDOL LACTATE 2 MG/ML PO CONC
0.5000 mg | ORAL | Status: DC | PRN
Start: 2016-09-02 — End: 2016-09-02
  Filled 2016-09-02: qty 0.3

## 2016-09-02 MED ORDER — LORAZEPAM 1 MG PO TABS: 1.0000 mg | ORAL_TABLET | ORAL | Status: DC | PRN

## 2016-09-02 MED ORDER — LORAZEPAM 2 MG/ML PO CONC: 1.0000 mg | ORAL | Status: DC | PRN

## 2016-09-02 NOTE — Consult Note (Signed)
Consultation Note Date: 09/02/2016   Patient Name: [name]  DOB: Feb 18, 1936  MRN: [id number]  Age / Sex: 81 y.o., female  PCP: [facility], [place] Referring Physician: Barton Dubois, MD  Reason for Consultation: Terminal Care  HPI/Patient Profile: 81 y.o. female  with past medical history of AML  admitted on 08/28/2016 with sepsis HCAP .   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 Found to have sepsis and neutropenic fever. Also with acute resp failure with hypoxia due to HCAP.                   Patient has since been transitioned to full comfort measures, a palliative consult has been requested for additional support for the family.   The patient is resting in bed, family has just met with Dr Jana Hakim, they are thankful for all the information they have received from providers here in the hospital. I introduced myself and palliative care as follows: Palliative medicine is specialized medical care for people living with serious illness. It focuses on providing relief from the symptoms and stress of a serious illness. The goal is to improve quality of life for both the patient and the family.  Patient appears to be comfortable at the moment, daughters have observed some non verbal gestures of distress/discomfort at times (wincing, grimacing, moaning, furrowing of the brow, clenching of the jaw). We discussed about appropriate opioid and benzo use for comfort.   I provided the family with hard choices book for their review.   Anticipate passing passing in the next few days. All questions answered to the best of my ability.   HCPOA  son, has 2 daughters who are at the bedside.   SUMMARY OF RECOMMENDATIONS     Agree with DNR DNI.  Agree with current comfort measures order set. Family provided with hard choices book for review. End of life signs and symptoms discussed with family. Anticipate hospital death,family aware, son is on his way to the hospital.  Code Status/Advance Care Planning:  DNR    Symptom Management:    comfort care order set in place   Palliative Prophylaxis:   Delirium Protocol  Additional Recommendations (Limitations, Scope, Preferences):  Full Comfort Care  Psycho-social/Spiritual:   Desire for further Chaplaincy support:yes  Additional Recommendations: Education on Hospice  Prognosis:   Hours - Days  Discharge Planning: Anticipated Hospital Death      Primary Diagnoses: Present on Admission: . Severe sepsis ([place]) . Pancytopenia ([place]) . AML (acute myeloid leukemia) ([place])   I have reviewed the medical record, interviewed the patient and family, and examined the patient. The following aspects are pertinent.  Past Medical History:  Diagnosis Date  . Anemia   . Anxiety   . Arthritis    "hand joints; hips; back" (05/23/2015)  . Cancer ([place])    dec. 2016-leukemia  .  Chronic lower back pain   . Depression   . GERD (gastroesophageal reflux disease)   . History of blood transfusion 05/23/2015   "Hgb 5.9"  . Hypertension   . Shortness of breath dyspnea    with exertion   Social History   Social History  . Marital status: Married    Spouse name: N/A  . Number of children: 3  . Years of education: 32   Social History Main Topics  . Smoking status: Never Smoker  . Smokeless tobacco: Never Used  . Alcohol use No  . Drug use: No  . Sexual activity: Not Currently   Other Topics Concern  . None   Social History Narrative   Retired Pharmacist, hospital   Denies abuse and feels safe at home   Family History  Problem Relation Age of Onset  . Heart attack Mother 73    Died age 45  . Heart disease Brother     Atrial fib   Scheduled Meds: .  cefTRIAXone (ROCEPHIN)  IV  2 g Intravenous Q24H  . lidocaine  1 patch Transdermal Q24H   Continuous Infusions: . dextrose 5 % and 0.45% NaCl 50 mL/hr at 09/02/16 0934   PRN Meds:.[DISCONTINUED] glycopyrrolate **OR** glycopyrrolate **OR** glycopyrrolate, [DISCONTINUED] haloperidol **OR** [DISCONTINUED] haloperidol **OR** haloperidol lactate, [DISCONTINUED] LORazepam **OR** [DISCONTINUED] LORazepam **OR** LORazepam, morphine injection, ondansetron (ZOFRAN) IV, polyvinyl alcohol Medications Prior to Admission:  Prior to Admission medications   Medication Sig Start Date End Date Taking? Authorizing Provider  acetaminophen (TYLENOL) 500 MG tablet Take 1,000 mg by mouth every 6 (six) hours as needed for moderate pain.   Yes Historical Provider, MD  aminocaproic acid (AMICAR) 500 MG tablet Take 1 tablet (500 mg total) by mouth 3 (three) times daily. 08/05/16  Yes [place]ia Phlegm, NP  lidocaine (LIDODERM) 5 % Place 1 patch onto the skin daily. Remove & Discard patch within 12 hours or as directed by MD   Yes Historical Provider, MD  magic mouthwash w/lidocaine SOLN Take 5 mLs by mouth 4 (four) times daily as needed for mouth pain. Swish and spit 08/23/16  Yes [name], MD  Melatonin 5 MG TABS Take 2.5 mg by mouth at bedtime.   Yes Historical Provider, MD  pantoprazole (PROTONIX) 40 MG tablet Take 1 tablet (40 mg total) by mouth every morning. 04/26/16  Yes [name], MD  traMADol (ULTRAM) 50 MG tablet Take 0.5-1 tablets (25-50 mg total) by mouth every 6 (six) hours as needed. Patient taking differently: Take 25-50 mg by mouth every 6 (six) hours as needed for moderate pain or severe pain.  08/26/16  Yes Gardenia Phlegm, NP  valACYclovir (VALTREX) 1000 MG tablet Take 1 tablet (1,000 mg total) by mouth 2 (two) times daily. 07/29/16  Yes Gardenia Phlegm, NP  Venlafaxine HCl 150 MG TB24 TAKE ONE TABLET BY MOUTH ONCE DAILY 05/13/16  Yes [facility], FNP  fluconazole  (DIFLUCAN) 200 MG tablet Take 1 tablet (200 mg total) by mouth daily. 08/27/16   Gardenia Phlegm, NP   Allergies  Allergen Reactions  . Cozaar [Losartan] Other (See Comments)    Cause pt to lose her taste for foods  . Codeine Itching and Other (See Comments)    Makes feel crazy  PT STATES SHE ALSO CAN NOT TAKE THE SYNTHETIC CODEINES  . Hydrocodone Itching    Tolerates with benadryl  . Oxycodone Itching  . Tetanus Toxoids Swelling and Rash   Review of Systems +  for weakness  Physical Exam  elderly lady, not awake, not alert Some what restless movements at times         Lungs breathing regularly without stridor or wheezing             Heart regular rate and rhythm             Abdomen soft, +BS             Neuro not responding to voice or touch  Vital Signs: BP (!) 160/51   Pulse 61   Temp 97.3 F (36.3 C) (Axillary)   Resp (!) 28   Ht _0  (1.727 m)   Wt 66 kg (145 lb 8.1 oz)   SpO2 94%   BMI 22.12 kg/m  Pain Assessment: CPOT POSS *See Group Information*: S-Acceptable,Sleep, easy to arouse Pain Score: 0-No pain   SpO2: SpO2: 94 % O2 Device:SpO2: 94 % O2 Flow Rate: .O2 Flow Rate (L/min): 2 L/min  IO: Intake/output summary:  Intake/Output Summary (Last 24 hours) at 09/02/16 1133 Last data filed at 09/02/16 1100  Gross per 24 hour  Intake          1999.17 ml  Output                0 ml  Net          1999.17 ml    LBM: Last BM Date: 08/31/16 Baseline Weight: Weight: 66 kg (145 lb 8.1 oz) Most recent weight: Weight: 66 kg (145 lb 8.1 oz)     Palliative Assessment/Data:   Flowsheet Rows   Flowsheet Row Most Recent Value  Intake Tab  Referral Department  Hospitalist  Unit at Time of Referral  Intermediate Care Unit  Palliative Care Primary Diagnosis  Cancer  Palliative Care Type  New Palliative care  Reason for referral  End of Life Care Assistance  Clinical Assessment  Palliative Performance Scale Score  10%  Pain Max last 24 hours  4  Pain Min Last  24 hours  3  Dyspnea Max Last 24 Hours  5  Dyspnea Min Last 24 hours  4  Nausea Max Last 24 Hours  3  Nausea Min Last 24 Hours  2  Psychosocial & Spiritual Assessment  Palliative Care Outcomes  Patient/Family meeting held?  Yes  Who was at the meeting?  2 daughters   Palliative Care Outcomes  Clarified goals of care      Time In:  10 Time Out:  11 Time Total:  60 min  Greater than 50%  of this time was spent counseling and coordinating care related to the above assessment and plan.  Signed by: Loistine Chance, MD  [phone]  Please contact Palliative Medicine Team phone at [phone] for questions and concerns.  For individual provider: See Shea Evans

## 2016-09-02 NOTE — Consult Note (Signed)
SLP Cancellation Note  Patient Details Name: [name] MRN: [id number] DOB: Dec 08, 1935   Cancelled treatment:       Orders for ST intervention have been cancelled. Please reconsult if needs arise.   Yanixan Mellinger B. Quentin Ore [facility], CCC-SLP [phone]  Shonna Chock 09/02/2016, 9:50 AM

## 2016-09-02 NOTE — Progress Notes (Signed)
[name]   DOB:08-08-1935   IR#:[id number]   BPZ#:[id number]  Subjective:  I met with the patient's 2 daughters, and her sone (by facetime) who has HCPOA, and reviewed [name]'s overall situation. 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 She did very well originally with azacitidine but after progression she did not respond to lenalidomide and had significant mucositis from those treatments. We had hoped she would live long enough to go back to [facility] to meet w Dr Nadara Mustard but all that was going to happen in that visit would have been to tell the patient she was not a transplant candidate.-- At this point [name] is not responsive. She seems to me likely to die in the next 24-72 hrs. The family is aware and they want nothing done that will prolong her dying process, of course would want anything done that would make [name] comfortable/   Objective: elderly White woman examined in bed Vitals:   09/02/16 1000 09/02/16 1010  BP:  (!) 160/51  Pulse: (!) 59 61  Resp: 18 (!) 22  Temp:      Body mass index is 22.12 kg/m.  Intake/Output Summary (Last 24 hours) at 09/02/16 1113 Last data filed at 09/02/16 1000  Gross per 24 hour  Intake          1949.17 ml  Output                0 ml  Net          1949.17 ml    She appears comfortable  No cervical or supraclavicular adenopathy  Lungs breathing regularly without stridor or wheezing  Heart regular rate and rhythm  Abdomen soft, +BS  Neuro not responding to voice or touch    CBG (last 3)  No results for input(s): GLUCAP in the last 72 hours.   Labs:  Lab Results  Component Value Date   WBC 3.6 (L) 09/02/2016   HGB 11.2 (L) 09/02/2016   HCT 31.0 (L) 09/02/2016   MCV 86.4 09/02/2016   PLT 6 (LL) 09/02/2016   NEUTROABS 0.0 (L) 08/31/2016    '@LASTCHEMISTRY'$ @  Urine Studies No results for input(s): UHGB, CRYS in the last 72 hours.  Invalid input(s): UACOL, UAPR, USPG, UPH, UTP, UGL, UKET, UBIL, UNIT, UROB, [place], UEPI,  UWBC, [place], [place], [place], [place], [place]  Basic Metabolic Panel:  Recent Labs Lab 08/30/16 0801 08/30/16 2319 08/31/16 0346 09/01/16 0331 09/02/16 0400  NA 139 139 137 142 145  K 3.7 3.1* 3.4* 3.3* 2.9*  CL 108 110 110 113* 116*  CO2 14* 17* 16* 16* 18*  GLUCOSE 109* 148* 122* 156* 152*  BUN 47* 56* 55* 58* 54*  CREATININE 1.83* 1.77* 1.65* 1.49* 1.25*  CALCIUM 8.1* 8.1* 8.0* 8.9 8.8*  MG  --  1.6* 2.1  --   --    GFR Estimated Creatinine Clearance: 37.4 mL/min (by C-G formula based on SCr of 1.25 mg/dL (H)). Liver Function Tests:  Recent Labs Lab 08/28/16 1745 08/28/16 2233  AST 30 37  ALT 19 19  ALKPHOS 122 99  BILITOT 1.2 1.3*  PROT 7.0 6.5  ALBUMIN 2.6* 2.1*   No results for input(s): LIPASE, AMYLASE in the last 168 hours. No results for input(s): AMMONIA in the last 168 hours. Coagulation profile No results for input(s): INR, PROTIME in the last 168 hours.  CBC:  Recent Labs Lab 08/28/16 1745 08/29/16 0410 08/30/16 0350 08/31/16 0346 09/01/16 0331 09/02/16 0400  WBC 4.7  4.2 6.9 1.7* 3.3* 3.6*  NEUTROABS 0.0* 0.0*  --  0.0*  --   --   HGB 9.6* 8.0* 7.7* 6.9* 10.9* 11.2*  HCT 27.5* 22.6* 22.4* 19.4* 30.5* 31.0*  MCV 89.0 89.7 92.9 88.2 86.2 86.4  PLT 15* 10* 6* 28* 18* 6*   Cardiac Enzymes:  Recent Labs Lab 08/30/16 2319 08/31/16 0346 08/31/16 0713  TROPONINI 9.19* 6.89* 4.70*   BNP: Invalid input(s): POCBNP CBG:  Recent Labs Lab 08/30/16 0453 08/30/16 0509 08/30/16 0618 08/30/16 0746  GLUCAP 39* 89 80 79   D-Dimer No results for input(s): DDIMER in the last 72 hours. Hgb A1c No results for input(s): HGBA1C in the last 72 hours. Lipid Profile No results for input(s): CHOL, HDL, LDLCALC, TRIG, CHOLHDL, LDLDIRECT in the last 72 hours. Thyroid function studies No results for input(s): TSH, T4TOTAL, T3FREE, THYROIDAB in the last 72 hours.  Invalid input(s): FREET3 Anemia work up No results for input(s): VITAMINB12, FOLATE, FERRITIN,  TIBC, IRON, RETICCTPCT in the last 72 hours. Microbiology Recent Results (from the past 240 hour(s))  Blood Culture (routine x 2)     Status: Abnormal   Collection Time: 08/28/16  5:30 PM  Result Value Ref Range Status   Specimen Description BLOOD LEFT WRIST  Final   Special Requests BOTTLES DRAWN AEROBIC ONLY 5CC  Final   Culture  Setup Time   Final    GRAM NEGATIVE RODS AEROBIC BOTTLE ONLY CRITICAL VALUE NOTED.  VALUE IS CONSISTENT WITH PREVIOUSLY REPORTED AND CALLED VALUE.    Culture (A)  Final    KLEBSIELLA OXYTOCA SUSCEPTIBILITIES PERFORMED ON PREVIOUS CULTURE WITHIN THE LAST 5 DAYS. Performed at [facility], [place] [address] [place], [place] [address]    Report Status 08/31/2016 FINAL  Final  Blood Culture (routine x 2)     Status: Abnormal   Collection Time: 08/28/16  5:39 PM  Result Value Ref Range Status   Specimen Description BLOOD RIGHT WRIST  Final   Special Requests BOTTLES DRAWN AEROBIC AND ANAEROBIC 5CC  Final   Culture  Setup Time   Final    GRAM NEGATIVE RODS IN BOTH AEROBIC AND ANAEROBIC BOTTLES CRITICAL RESULT CALLED TO, READ BACK BY AND VERIFIED WITH: J. Scherrie November Pharm.D. 11:15 08/29/16 ([email or web address]) Performed at [facility], [place] [address] [place], [place] [address]    Culture KLEBSIELLA OXYTOCA (A)  Final   Report Status 08/31/2016 FINAL  Final   Organism ID, Bacteria KLEBSIELLA OXYTOCA  Final      Susceptibility   Klebsiella oxytoca - MIC*    AMPICILLIN >=32 RESISTANT Resistant     CEFAZOLIN 8 SENSITIVE Sensitive     CEFEPIME <=1 SENSITIVE Sensitive     CEFTAZIDIME <=1 SENSITIVE Sensitive     CEFTRIAXONE <=1 SENSITIVE Sensitive     CIPROFLOXACIN <=0.25 SENSITIVE Sensitive     GENTAMICIN <=1 SENSITIVE Sensitive     IMIPENEM <=0.25 SENSITIVE Sensitive     TRIMETH/SULFA <=20 SENSITIVE Sensitive     AMPICILLIN/SULBACTAM 16 INTERMEDIATE Intermediate     PIP/TAZO <=4 SENSITIVE Sensitive     Extended ESBL NEGATIVE Sensitive     * KLEBSIELLA OXYTOCA   Blood Culture ID Panel (Reflexed)     Status: Abnormal   Collection Time: 08/28/16  5:39 PM  Result Value Ref Range Status   Enterococcus species NOT DETECTED NOT DETECTED Final   Listeria monocytogenes NOT DETECTED NOT DETECTED Final   Staphylococcus species NOT DETECTED NOT DETECTED Final   Staphylococcus aureus  NOT DETECTED NOT DETECTED Final   Streptococcus species NOT DETECTED NOT DETECTED Final   Streptococcus agalactiae NOT DETECTED NOT DETECTED Final   Streptococcus pneumoniae NOT DETECTED NOT DETECTED Final   Streptococcus pyogenes NOT DETECTED NOT DETECTED Final   Acinetobacter baumannii NOT DETECTED NOT DETECTED Final   Enterobacteriaceae species DETECTED (A) NOT DETECTED Final    Comment: Enterobacteriaceae represent a large family of gram-negative bacteria, not a single organism. CRITICAL RESULT CALLED TO, READ BACK BY AND VERIFIED WITH: J. Scherrie November Pharm.D. 11:15 08/29/16 ([email or web address])    Enterobacter cloacae complex NOT DETECTED NOT DETECTED Final   Escherichia coli NOT DETECTED NOT DETECTED Final   Klebsiella oxytoca DETECTED (A) NOT DETECTED Final    Comment: CRITICAL RESULT CALLED TO, READ BACK BY AND VERIFIED WITH: J. Scherrie November Pharm.D. 11:15 08/29/16 ([email or web address])    Klebsiella pneumoniae NOT DETECTED NOT DETECTED Final   Proteus species NOT DETECTED NOT DETECTED Final   Serratia marcescens NOT DETECTED NOT DETECTED Final   Carbapenem resistance NOT DETECTED NOT DETECTED Final   Haemophilus influenzae NOT DETECTED NOT DETECTED Final   Neisseria meningitidis NOT DETECTED NOT DETECTED Final   Pseudomonas aeruginosa NOT DETECTED NOT DETECTED Final   Candida albicans NOT DETECTED NOT DETECTED Final   Candida glabrata NOT DETECTED NOT DETECTED Final   Candida krusei NOT DETECTED NOT DETECTED Final   Candida parapsilosis NOT DETECTED NOT DETECTED Final   Candida tropicalis NOT DETECTED NOT DETECTED Final    Comment: Performed at [facility], [place] [address], [place],  [place] [address]  Culture, Urine     Status: None   Collection Time: 08/28/16 10:33 PM  Result Value Ref Range Status   Specimen Description URINE, CLEAN CATCH  Final   Special Requests NONE  Final   Culture   Final    NO GROWTH Performed at [facility], [place] [address] [place], [place] [address]    Report Status 08/30/2016 FINAL  Final  MRSA PCR Screening     Status: None   Collection Time: 08/29/16 12:17 PM  Result Value Ref Range Status   MRSA by PCR NEGATIVE NEGATIVE Final    Comment:        The GeneXpert MRSA Assay (FDA approved for NASAL specimens only), is one component of a comprehensive MRSA colonization surveillance program. It is not intended to diagnose MRSA infection nor to guide or monitor treatment for MRSA infections.       Studies:  No results found.  Assessment: 81 y.o. [place] woman with acute myeloid leukemia admitted with sepsis and transitioned to comfort/terminal care  (0) diagnosed by bone marrow biopsy 05/25/2015, with the blast count between 17 and 30% depending on the method of determination.              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, with gain of 2 and 19             (b) considered allogenic transplant--not felt to be a candidate             (c) all transfusion products to be irradiated  (1) started sQ azacitidine 06/05/2015, receiving 7 doses every 28 day cycle             (a) repeat bone marrow biopsy 02/29/2016 showed a mildly hypercellular marrow with dyspoiesis, but no increase in blasts either histologically or by flow cytometry             (  b) repeat bone marrow biopsy 06/03/2016 shows recurrent acute myeloid leukemia  (2) immune thrombocytopenic purpura             (a) s/p IVIG 07/06/2015 (1g/kg x 2d) with excellent response             (b) romiplostim first dose 07/07/2015, repeated weekly, discontinued after 08/12/2016 dose with no evidence of response  (3) biopsy of an enlarged right  axillary lymph node at [facility] 11/13/2015 was nondiagnostic.  (4) started lenalidomide at 10 mg daily beginning 06/18/2016 (3 weeks on, 1 week off)-- poorly tolerated  (5) on 08/05/2016  restarted Lenalidomide  at '5mg'$  daily, discontinued with severe mucositis  (6)  in home palliative care consulted             (a) patient has a signed out of facility DO NOT RESUSCITATE order on hand at home   (7) admitted 08/28/2016 with febrile neutropenia, severe pancytopenia, blood cultures positive for klebsiella, bilateral CPNA  (a) transitioned to comfort care 09/02/2016, DNR in place    Plan:  I discussed terminal care issues with the family. Generally at this point my practice is to stop all lab draws; discontinue cardiac monitors; use decadron for terminal fever or any fever that may make the patient uncomfortable; if possible discontinue IVF, or change to KVO--may wish to maintain a line in case morphione or steroids become necessary. They understand even with the best of care there is frequently some struggle at the end. Also that patients apparently can hear and therefore we should not speak as if she were not in the room--they can communicate with her, keeping it simple and is possible positive.  I will follow peripherally for family support. I greatly appreciate your excellent care to this patient and her family!   [name], MD 09/02/2016  11:13 AM Medical Oncology and Hematology [facility] [address] [place], [place] [address] Tel. [phone]    Fax. [phone]

## 2016-09-02 NOTE — Progress Notes (Signed)
[facility] PROGRESS NOTE  [name] FAO:[id number] DOB: January 19, 1936 DOA: 08/28/2016 PCP: [name], FNP  Interim summary and HPI 81 y.o. 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 Found to have sepsis and neutropenic fever. Also with acute resp failure with hypoxia due to HCAP.    Will focus on essentially comfort care; complete only abx therapy. Patient will be transfer to [place] consulted for assistance with end of life care. Patient a high risk for hospital death.                 Assessment/Plan: Sepsis secondary to HCAP and Klebsiella bacteremia --given neg MRSA PCR and available culture info, vancomycin was discontinued --Continue Neutropenic precautions (neutrophils continue to be 0.0) --Blood cultures grew Klebsiella Oxytoca, will follow sensitivity and narrow to rocephin and as per discussed with family will give 4 more doses to complete abx therapy.  --Urine streptococcal antigen neg --repeat lactic acid 15.4 >>10.7 >>6.5>>4.6 (last one checked --Flu screen neg --overall sepsis features/numbers are better; but patient with overall worsening clinical status and very poor prognosis (now having great difficulty tolerating PO's' and unable to eat or drink anything). --will remains on full liquid; SPL has assessed patient and patient is high risk aspiration; now planning to provide comfort feeding.  Acute hypoxic respiratory failure secondary to sepsis, HCAP --off BiPAP now for 72 hours; so far well tolerated --will follow resp function status and focus on comfort/symptomatic management  --Lasix 20mg  IV one time given after aggressive IVF's in ED; patient with signs of vascular congestion on x-ray and crackles on exam. No further lasix given elevated lactic acid, no further acute signs of vascular congestion  and in the setting of sepsis. --will change IVF's rate to 20cc/hr (intended to maintain IV open and facilitate medication therapy. --continue abx's for PNA as discussed with family.  Pancytopenia secondary to history of AML,now transfusion dependent --Neutropenic precautions kept during hopsitalization (ANC zero on admission and continue to be zero) --received IV antibiotics broad spectrum for 6 days; now narrow to rocephin after discussing with family, will like to complete abx therapy (4 more doses pending); if able tolerated otherwise. --after 1 unit of platelets platelets went up to 28K; and back to 6 now. No further blood transfusion decided with family. Also unable to tolerate PO's. So will stop Amicar. --Hgb 11.2, received 2 units of PRBC's during this hospitalization  --STAT head CT and CT A/P without acute abnormality and no signs of bleeding or acute abnormality. --will focus now on full comfort care.  Hypokalemia --repleted --now will focus on comfort care and no further labs with be draw  Acute on chronic renal failure: appears to have stage 3 renal failure base on GFR --will minimize nephrotoxic agents --after [place] meeting, will focus on full comfort care. --no further blood draws --pre-renal azotemia from dehydration as main cause; low perfusion with sepsis contributing as well.  Elevated troponin/non sustained SVT --was discussed with cardiology service, recommendations to follow troponin trend and to check 2-D echo; now with results, recommending to comfort care only. --patient with most likely type II NSTEMI secondary to severe sepsis. Profound anemia contributing. --also worsening renal function on admission contributing to abnormal troponin --no CP ever expressed --2-D echo with decrease EF (new from last echo) and with wall motion abnormalities --patient very frail and not amenable for  cardiac intervention.  Severe protein calorie malnutrition  --will follow  nutritional rec's for feeding supplements --Patient having trouble swallowing now and at high risk for aspiration --after discussing with family will focus on comfort care.  Code Status: DNR Family Communication: Daughter at bedside  Disposition Plan: patient overall condition has continue to declined; now unable to follow commands; and not eating or drinking anything. After discussing with family GOC' patient will be transitioned to med-surg bed and will follow essentially comfort care.    Consultants:  Oncology   Palliative care consulted  Cardiology ([facility])  Procedures:  See below for x-ray reports   2-D echo - Left ventricle: The cavity size was normal. Wall thickness was   increased in a pattern of moderate LVH. Systolic function was   moderately to severely reduced. The estimated ejection fraction   was in the range of 30% to 35%. There is akinesis of the   mid-apicalanteroseptal, anterior, and apical myocardium. There is   akinesis of the apicalinferior myocardium. Doppler parameters are   consistent with abnormal left ventricular relaxation (grade 1   diastolic dysfunction). - Aortic valve: Mildly calcified annulus. Trileaflet; mildly   calcified leaflets. - Mitral valve: Calcified annulus. There was trivial regurgitation. - Left atrium: The atrium was mildly to moderately dilated. - Right ventricle: Apical right ventricular function is reduced. - Right atrium: Central venous pressure (est): 3 mm Hg. - Tricuspid valve: There was mild regurgitation. - Pulmonary arteries: PA peak pressure: 39 mm Hg (S). - Pericardium, extracardiac: There was a left pleural effusion.  Impressions:  - Moderate LVH with LVEF 30-35%. There is a large region of   akinesis involving the mid to apical anteroseptal, anterior, and   periapical myocardium. Ischemic cardiomyopathy or potentially   stress-induced: cardiomyopathy could be considered. These wall   motion abnormalities are  new in comparison to the previous study   from 2016. Grade 1 diastolic dysfunction. Mild to moderate left   atrial enlargement. Mildly sclerotic aortic valve. Apical RV   dysfunction noted. Mild tricuspid regurgitation with PASP   estimated 39 mmHg. Left pleural effusion noted.   Antibiotics:  vanc 3/7>>3/9  zosyn 3/7>>3/12  Rocephin 3/12  HPI/Subjective: Afebrile currently; breathing stable overall. No major distress appreciated. Patient sleepy/lethargic and no following commands currently.  Objective: Vitals:   09/02/16 0333 09/02/16 0400  BP:  135/68  Pulse:  93  Resp:  (!) 23  Temp: (!) 96 F (35.6 C)     Intake/Output Summary (Last 24 hours) at 09/02/16 0934 Last data filed at 09/02/16 0500  Gross per 24 hour  Intake             1725 ml  Output                0 ml  Net             1725 ml   Filed Weights   08/29/16 1151  Weight: 66 kg (145 lb 8.1 oz)    Exam:   General: patient is lethargic and somnolent; moving four limbs, but not following commands. No fever. Patient using O2 supplementation (2-3L [place]) O2 and has sat of 93-95%  Cardiovascular: S1 and s2, no rubs or gallops, no murmurs appreciated   Respiratory: fair air movement, no wheezing, no crackles. Not using accessory muscles currently; diffuse rhonchi.  Abdomen: soft, no guarding, positive BS  Musculoskeletal: no cyanosis or clubbing; no edema seen. Patient with SCD's in place.  Data Reviewed: Basic Metabolic  Panel:  Recent Labs Lab 08/30/16 0801 08/30/16 2319 08/31/16 0346 09/01/16 0331 09/02/16 0400  NA 139 139 137 142 145  K 3.7 3.1* 3.4* 3.3* 2.9*  CL 108 110 110 113* 116*  CO2 14* 17* 16* 16* 18*  GLUCOSE 109* 148* 122* 156* 152*  BUN 47* 56* 55* 58* 54*  CREATININE 1.83* 1.77* 1.65* 1.49* 1.25*  CALCIUM 8.1* 8.1* 8.0* 8.9 8.8*  MG  --  1.6* 2.1  --   --    Estimated Creatinine Clearance: 37.4 mL/min (by C-G formula based on SCr of 1.25 mg/dL (H)).  Liver Function  Tests:  Recent Labs Lab 08/28/16 1745 08/28/16 2233  AST 30 37  ALT 19 19  ALKPHOS 122 99  BILITOT 1.2 1.3*  PROT 7.0 6.5  ALBUMIN 2.6* 2.1*   CBC:  Recent Labs Lab 08/28/16 1745 08/29/16 0410 08/30/16 0350 08/31/16 0346 09/01/16 0331 09/02/16 0400  WBC 4.7 4.2 6.9 1.7* 3.3* 3.6*  NEUTROABS 0.0* 0.0*  --  0.0*  --   --   HGB 9.6* 8.0* 7.7* 6.9* 10.9* 11.2*  HCT 27.5* 22.6* 22.4* 19.4* 30.5* 31.0*  MCV 89.0 89.7 92.9 88.2 86.2 86.4  PLT 15* 10* 6* 28* 18* 6*   CBG:  Recent Labs Lab 08/30/16 0453 08/30/16 0509 08/30/16 0618 08/30/16 0746  GLUCAP 39* 89 80 79    Recent Results (from the past 240 hour(s))  Blood Culture (routine x 2)     Status: Abnormal   Collection Time: 08/28/16  5:30 PM  Result Value Ref Range Status   Specimen Description BLOOD LEFT WRIST  Final   Special Requests BOTTLES DRAWN AEROBIC ONLY 5CC  Final   Culture  Setup Time   Final    GRAM NEGATIVE RODS AEROBIC BOTTLE ONLY CRITICAL VALUE NOTED.  VALUE IS CONSISTENT WITH PREVIOUSLY REPORTED AND CALLED VALUE.    Culture (A)  Final    KLEBSIELLA OXYTOCA SUSCEPTIBILITIES PERFORMED ON PREVIOUS CULTURE WITHIN THE LAST 5 DAYS. Performed at [facility], [place] [address] [place], [place] [address]    Report Status 08/31/2016 FINAL  Final  Blood Culture (routine x 2)     Status: Abnormal   Collection Time: 08/28/16  5:39 PM  Result Value Ref Range Status   Specimen Description BLOOD RIGHT WRIST  Final   Special Requests BOTTLES DRAWN AEROBIC AND ANAEROBIC 5CC  Final   Culture  Setup Time   Final    GRAM NEGATIVE RODS IN BOTH AEROBIC AND ANAEROBIC BOTTLES CRITICAL RESULT CALLED TO, READ BACK BY AND VERIFIED WITH: J. Scherrie November Pharm.D. 11:15 08/29/16 ([email or web address]) Performed at [facility], [place] [address], [place], [place] [address]    Culture KLEBSIELLA OXYTOCA (A)  Final   Report Status 08/31/2016 FINAL  Final   Organism ID, Bacteria KLEBSIELLA OXYTOCA  Final      Susceptibility    Klebsiella oxytoca - MIC*    AMPICILLIN >=32 RESISTANT Resistant     CEFAZOLIN 8 SENSITIVE Sensitive     CEFEPIME <=1 SENSITIVE Sensitive     CEFTAZIDIME <=1 SENSITIVE Sensitive     CEFTRIAXONE <=1 SENSITIVE Sensitive     CIPROFLOXACIN <=0.25 SENSITIVE Sensitive     GENTAMICIN <=1 SENSITIVE Sensitive     IMIPENEM <=0.25 SENSITIVE Sensitive     TRIMETH/SULFA <=20 SENSITIVE Sensitive     AMPICILLIN/SULBACTAM 16 INTERMEDIATE Intermediate     PIP/TAZO <=4 SENSITIVE Sensitive     Extended ESBL NEGATIVE Sensitive     * KLEBSIELLA  OXYTOCA  Blood Culture ID Panel (Reflexed)     Status: Abnormal   Collection Time: 08/28/16  5:39 PM  Result Value Ref Range Status   Enterococcus species NOT DETECTED NOT DETECTED Final   Listeria monocytogenes NOT DETECTED NOT DETECTED Final   Staphylococcus species NOT DETECTED NOT DETECTED Final   Staphylococcus aureus NOT DETECTED NOT DETECTED Final   Streptococcus species NOT DETECTED NOT DETECTED Final   Streptococcus agalactiae NOT DETECTED NOT DETECTED Final   Streptococcus pneumoniae NOT DETECTED NOT DETECTED Final   Streptococcus pyogenes NOT DETECTED NOT DETECTED Final   Acinetobacter baumannii NOT DETECTED NOT DETECTED Final   Enterobacteriaceae species DETECTED (A) NOT DETECTED Final    Comment: Enterobacteriaceae represent a large family of gram-negative bacteria, not a single organism. CRITICAL RESULT CALLED TO, READ BACK BY AND VERIFIED WITH: J. Scherrie November Pharm.D. 11:15 08/29/16 ([email or web address])    Enterobacter cloacae complex NOT DETECTED NOT DETECTED Final   Escherichia coli NOT DETECTED NOT DETECTED Final   Klebsiella oxytoca DETECTED (A) NOT DETECTED Final    Comment: CRITICAL RESULT CALLED TO, READ BACK BY AND VERIFIED WITH: J. Scherrie November Pharm.D. 11:15 08/29/16 ([email or web address])    Klebsiella pneumoniae NOT DETECTED NOT DETECTED Final   Proteus species NOT DETECTED NOT DETECTED Final   Serratia marcescens NOT DETECTED NOT DETECTED Final   Carbapenem resistance  NOT DETECTED NOT DETECTED Final   Haemophilus influenzae NOT DETECTED NOT DETECTED Final   Neisseria meningitidis NOT DETECTED NOT DETECTED Final   Pseudomonas aeruginosa NOT DETECTED NOT DETECTED Final   Candida albicans NOT DETECTED NOT DETECTED Final   Candida glabrata NOT DETECTED NOT DETECTED Final   Candida krusei NOT DETECTED NOT DETECTED Final   Candida parapsilosis NOT DETECTED NOT DETECTED Final   Candida tropicalis NOT DETECTED NOT DETECTED Final    Comment: Performed at [facility], [place] [address] [place], [place] [address]  Culture, Urine     Status: None   Collection Time: 08/28/16 10:33 PM  Result Value Ref Range Status   Specimen Description URINE, CLEAN CATCH  Final   Special Requests NONE  Final   Culture   Final    NO GROWTH Performed at [facility], [place] [address], [place], [place] [address]    Report Status 08/30/2016 FINAL  Final  MRSA PCR Screening     Status: None   Collection Time: 08/29/16 12:17 PM  Result Value Ref Range Status   MRSA by PCR NEGATIVE NEGATIVE Final    Comment:        The GeneXpert MRSA Assay (FDA approved for NASAL specimens only), is one component of a comprehensive MRSA colonization surveillance program. It is not intended to diagnose MRSA infection nor to guide or monitor treatment for MRSA infections.      Studies: No results found.  Scheduled Meds: . cefTRIAXone (ROCEPHIN)  IV  2 g Intravenous Q24H  . lactose free nutrition  237 mL Oral TID WC  . lidocaine  1 patch Transdermal Q24H   Continuous Infusions: . dextrose 5 % and 0.45% NaCl 50 mL/hr at 09/02/16 0981    Principal Problem:   Severe sepsis (HCC) Active Problems:   Pancytopenia (HCC)   AML (acute myeloid leukemia) (HCC)   Acute encephalopathy   Fall   Hypokalemia   Lactic acidosis   Community acquired pneumonia of left lung   HCAP (healthcare-associated pneumonia)   Bacteremia   Non-ST elevation myocardial infarction (NSTEMI), type 2  ([place])    Time  spent: 25 minutes    Barton Dubois  [facility] Pager [phone]. If 7PM-7AM, please contact night-coverage at [email or web address], password [facility] 09/02/2016, 9:34 AM  LOS: 5 days

## 2016-09-03 DIAGNOSIS — Z515 Encounter for palliative care: Secondary | ICD-10-CM

## 2016-09-03 DIAGNOSIS — Z7189 Other specified counseling: Secondary | ICD-10-CM

## 2016-09-03 MED ORDER — LORAZEPAM 2 MG/ML IJ SOLN
1.0000 mg | INTRAMUSCULAR | Status: DC | PRN
  Administered 2016-09-04: 1 mg via INTRAVENOUS
  Filled 2016-09-03: qty 1

## 2016-09-03 NOTE — Progress Notes (Signed)
Nutrition Brief Note  Chart reviewed. Pt now transitioning to comfort care. Anticipated hospital death. No further nutrition interventions warranted at this time.   Nelline Lio, MS, RD, LDN Pager: [phone] After Hours Pager: [phone]  

## 2016-09-03 NOTE — Care Management Important Message (Signed)
Important Message  Patient Details  Name: [name] MRN: [id number] Date of Birth: 12-Dec-1935   Medicare Important Message Given:  Yes    Kerin Salen 09/03/2016, 10:22 AMImportant Message  Patient Details  Name: [name] MRN: [id number] Date of Birth: 02-Jan-1936   Medicare Important Message Given:  Yes    Kerin Salen 09/03/2016, 10:21 AM

## 2016-09-03 NOTE — Progress Notes (Signed)
[facility] PROGRESS NOTE  [name]:[id number] DOB: 05-07-1936 DOA: 08/28/2016 PCP: [name], FNP  Interim summary and HPI 81 y.o. 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 Found to have sepsis and neutropenic fever. Also with acute resp failure with hypoxia due to HCAP.    Will focus on essentially comfort care; complete only abx therapy. Patient will be transfer to [facility] consulted for assistance with end of life care. Patient a high risk for hospital death.                 Assessment/Plan: Sepsis secondary to HCAP and Klebsiella bacteremia --given neg MRSA PCR and available culture info, vancomycin was discontinued --Continue Neutropenic precautions (neutrophils continue to be 0.0) --Blood cultures grew Klebsiella Oxytoca, Following sensitivity abx's narrowed to rocephin and as per discussed with family no further antibiotic to be provided now.   --Urine streptococcal antigen neg --repeat lactic acid 15.4 >>10.7 >>6.5>>4.6 (last one checked) --Flu screen neg --overall sepsis features/numbers improved; but patient with overall worsening clinical status and very poor prognosis (now essentially non-responsive and no tolerating PO's). --will remains comfort feeding --anticipate hospital death; but if stabilizes, can be transfer to residential hopsice for further care (assessing for another 24 hours).  Acute hypoxic respiratory failure secondary to sepsis, HCAP --off BiPAP now for 72 hours; so far well tolerated --will follow resp function status and focus on comfort/symptomatic management  --Lasix 20mg  IV one time given after aggressive IVF's in ED; patient with signs of vascular congestion on x-ray and crackles on exam. No further lasix given elevated lactic acid, no further acute signs of vascular  congestion and in the setting of sepsis. --will change IVF's rate to 20cc/hr (intended to maintain IV open and facilitate medication therapy. --after further discussion with family; no further antibiotics or interventions. Full comfort care now.  Pancytopenia secondary to history of AML,now transfusion dependent --Neutropenic precautions kept during hopsitalization (ANC zero on admission and continue to be zero) --received IV antibiotics broad spectrum for 6 days; now narrow to rocephin after discussing with family, will like to complete abx therapy (4 more doses pending); if able tolerated otherwise. --after 1 unit of platelets platelets went up to 28K; and back to 6 now. No further blood transfusion decided with family. Also unable to tolerate PO's. So will stop Amicar. --Hgb 11.2, received 2 units of PRBC's during this hospitalization  --STAT head CT and CT A/P without acute abnormality and no signs of bleeding or acute abnormality. --will focus now on full comfort care only.  Hypokalemia --repleted for last time on 3/12 --now will focus on comfort care and no further labs with be draw  Acute on chronic renal failure: appears to have stage 3 renal failure base on GFR --will minimize nephrotoxic agents --after [place] meeting, will focus on full comfort care now --no further blood draws --pre-renal azotemia from dehydration as main cause; low perfusion with sepsis contributing as well.  Elevated troponin/non sustained SVT --was discussed with cardiology service, recommendations to follow troponin trend and to check 2-D echo; now with results, recommending to comfort care only. --patient with most likely type II NSTEMI secondary to severe sepsis. Profound anemia contributing. --also worsening renal function on admission contributing to abnormal troponin --no CP ever expressed --2-D echo with decrease EF (new from last echo) and with wall motion abnormalities --  patient very frail and not  amenable for cardiac intervention.  Severe protein calorie malnutrition  --after discussing with family will focus on comfort care now.  Code Status: DNR Family Communication: Daughter at bedside  Disposition Plan: patient overall condition has continue to declined; now unable to follow commands and essentially unresponsive. If she stabilizes residential hospice can be considered; but looks that she might past away soon.   Consultants:  Oncology   Palliative care consulted  Cardiology ([facility])  Procedures:  See below for x-ray reports   2-D echo - Left ventricle: The cavity size was normal. Wall thickness was   increased in a pattern of moderate LVH. Systolic function was   moderately to severely reduced. The estimated ejection fraction   was in the range of 30% to 35%. There is akinesis of the   mid-apicalanteroseptal, anterior, and apical myocardium. There is   akinesis of the apicalinferior myocardium. Doppler parameters are   consistent with abnormal left ventricular relaxation (grade 1   diastolic dysfunction). - Aortic valve: Mildly calcified annulus. Trileaflet; mildly   calcified leaflets. - Mitral valve: Calcified annulus. There was trivial regurgitation. - Left atrium: The atrium was mildly to moderately dilated. - Right ventricle: Apical right ventricular function is reduced. - Right atrium: Central venous pressure (est): 3 mm Hg. - Tricuspid valve: There was mild regurgitation. - Pulmonary arteries: PA peak pressure: 39 mm Hg (S). - Pericardium, extracardiac: There was a left pleural effusion.  Impressions:  - Moderate LVH with LVEF 30-35%. There is a large region of   akinesis involving the mid to apical anteroseptal, anterior, and   periapical myocardium. Ischemic cardiomyopathy or potentially   stress-induced: cardiomyopathy could be considered. These wall   motion abnormalities are new in comparison to the previous study   from 2016. Grade 1  diastolic dysfunction. Mild to moderate left   atrial enlargement. Mildly sclerotic aortic valve. Apical RV   dysfunction noted. Mild tricuspid regurgitation with PASP   estimated 39 mmHg. Left pleural effusion noted.   Antibiotics:  vanc 3/7>>3/9  zosyn 3/7>>3/12  Rocephin 3/12>>09/03/16  HPI/Subjective: Afebrile currently; comfortable overall, mild agonal breathing appreciated. No eating or drinking and essentially unresponsive. Very frail and currently might not tolerate transportation.  Objective: Vitals:   09/02/16 1445 09/03/16 0900  BP: (!) 149/97 (!) 112/53  Pulse: 72 76  Resp: 18 20  Temp:  97.6 F (36.4 C)    Intake/Output Summary (Last 24 hours) at 09/03/16 0943 Last data filed at 09/02/16 2021  Gross per 24 hour  Intake              245 ml  Output                0 ml  Net              245 ml   Filed Weights   08/29/16 1151  Weight: 66 kg (145 lb 8.1 oz)    Exam:   General: patient is essentially unresponsive; moving four limbs, but not following commands. No fever. Patient with mild agonal breathing appreciated, but overall comfortable.  Cardiovascular: S1 and s2, no rubs or gallops, no murmurs appreciated   Respiratory: fair air movement, no wheezing, no crackles. Not using accessory muscles currently; diffuse rhonchi.  Abdomen: soft, no guarding, positive BS  Musculoskeletal: no cyanosis or clubbing; no edema seen.   Data Reviewed: Basic Metabolic Panel:  Recent Labs Lab 08/30/16 0801 08/30/16 2319 08/31/16 0346 09/01/16 0331 09/02/16 0400  NA 139 139 137 142 145  K 3.7 3.1* 3.4* 3.3* 2.9*  CL 108 110 110 113* 116*  CO2 14* 17* 16* 16* 18*  GLUCOSE 109* 148* 122* 156* 152*  BUN 47* 56* 55* 58* 54*  CREATININE 1.83* 1.77* 1.65* 1.49* 1.25*  CALCIUM 8.1* 8.1* 8.0* 8.9 8.8*  MG  --  1.6* 2.1  --   --    Estimated Creatinine Clearance: 37.4 mL/min (by C-G formula based on SCr of 1.25 mg/dL (H)).  Liver Function Tests:  Recent  Labs Lab 08/28/16 1745 08/28/16 2233  AST 30 37  ALT 19 19  ALKPHOS 122 99  BILITOT 1.2 1.3*  PROT 7.0 6.5  ALBUMIN 2.6* 2.1*   CBC:  Recent Labs Lab 08/28/16 1745 08/29/16 0410 08/30/16 0350 08/31/16 0346 09/01/16 0331 09/02/16 0400  WBC 4.7 4.2 6.9 1.7* 3.3* 3.6*  NEUTROABS 0.0* 0.0*  --  0.0*  --   --   HGB 9.6* 8.0* 7.7* 6.9* 10.9* 11.2*  HCT 27.5* 22.6* 22.4* 19.4* 30.5* 31.0*  MCV 89.0 89.7 92.9 88.2 86.2 86.4  PLT 15* 10* 6* 28* 18* 6*   CBG:  Recent Labs Lab 08/30/16 0453 08/30/16 0509 08/30/16 0618 08/30/16 0746  GLUCAP 39* 89 80 79    Recent Results (from the past 240 hour(s))  Blood Culture (routine x 2)     Status: Abnormal   Collection Time: 08/28/16  5:30 PM  Result Value Ref Range Status   Specimen Description BLOOD LEFT WRIST  Final   Special Requests BOTTLES DRAWN AEROBIC ONLY 5CC  Final   Culture  Setup Time   Final    GRAM NEGATIVE RODS AEROBIC BOTTLE ONLY CRITICAL VALUE NOTED.  VALUE IS CONSISTENT WITH PREVIOUSLY REPORTED AND CALLED VALUE.    Culture (A)  Final    KLEBSIELLA OXYTOCA SUSCEPTIBILITIES PERFORMED ON PREVIOUS CULTURE WITHIN THE LAST 5 DAYS. Performed at [facility], [place] [address], [place], [place] [address]    Report Status 08/31/2016 FINAL  Final  Blood Culture (routine x 2)     Status: Abnormal   Collection Time: 08/28/16  5:39 PM  Result Value Ref Range Status   Specimen Description BLOOD RIGHT WRIST  Final   Special Requests BOTTLES DRAWN AEROBIC AND ANAEROBIC 5CC  Final   Culture  Setup Time   Final    GRAM NEGATIVE RODS IN BOTH AEROBIC AND ANAEROBIC BOTTLES CRITICAL RESULT CALLED TO, READ BACK BY AND VERIFIED WITH: J. Scherrie November Pharm.D. 11:15 08/29/16 ([email or web address]) Performed at [facility], [place] [address] [place], [place] [address]    Culture KLEBSIELLA OXYTOCA (A)  Final   Report Status 08/31/2016 FINAL  Final   Organism ID, Bacteria KLEBSIELLA OXYTOCA  Final      Susceptibility   Klebsiella oxytoca -  MIC*    AMPICILLIN >=32 RESISTANT Resistant     CEFAZOLIN 8 SENSITIVE Sensitive     CEFEPIME <=1 SENSITIVE Sensitive     CEFTAZIDIME <=1 SENSITIVE Sensitive     CEFTRIAXONE <=1 SENSITIVE Sensitive     CIPROFLOXACIN <=0.25 SENSITIVE Sensitive     GENTAMICIN <=1 SENSITIVE Sensitive     IMIPENEM <=0.25 SENSITIVE Sensitive     TRIMETH/SULFA <=20 SENSITIVE Sensitive     AMPICILLIN/SULBACTAM 16 INTERMEDIATE Intermediate     PIP/TAZO <=4 SENSITIVE Sensitive     Extended ESBL NEGATIVE Sensitive     * KLEBSIELLA OXYTOCA  Blood Culture ID Panel (Reflexed)     Status: Abnormal   Collection  Time: 08/28/16  5:39 PM  Result Value Ref Range Status   Enterococcus species NOT DETECTED NOT DETECTED Final   Listeria monocytogenes NOT DETECTED NOT DETECTED Final   Staphylococcus species NOT DETECTED NOT DETECTED Final   Staphylococcus aureus NOT DETECTED NOT DETECTED Final   Streptococcus species NOT DETECTED NOT DETECTED Final   Streptococcus agalactiae NOT DETECTED NOT DETECTED Final   Streptococcus pneumoniae NOT DETECTED NOT DETECTED Final   Streptococcus pyogenes NOT DETECTED NOT DETECTED Final   Acinetobacter baumannii NOT DETECTED NOT DETECTED Final   Enterobacteriaceae species DETECTED (A) NOT DETECTED Final    Comment: Enterobacteriaceae represent a large family of gram-negative bacteria, not a single organism. CRITICAL RESULT CALLED TO, READ BACK BY AND VERIFIED WITH: J. Scherrie November Pharm.D. 11:15 08/29/16 ([email or web address])    Enterobacter cloacae complex NOT DETECTED NOT DETECTED Final   Escherichia coli NOT DETECTED NOT DETECTED Final   Klebsiella oxytoca DETECTED (A) NOT DETECTED Final    Comment: CRITICAL RESULT CALLED TO, READ BACK BY AND VERIFIED WITH: J. Scherrie November Pharm.D. 11:15 08/29/16 ([email or web address])    Klebsiella pneumoniae NOT DETECTED NOT DETECTED Final   Proteus species NOT DETECTED NOT DETECTED Final   Serratia marcescens NOT DETECTED NOT DETECTED Final   Carbapenem resistance NOT DETECTED NOT  DETECTED Final   Haemophilus influenzae NOT DETECTED NOT DETECTED Final   Neisseria meningitidis NOT DETECTED NOT DETECTED Final   Pseudomonas aeruginosa NOT DETECTED NOT DETECTED Final   Candida albicans NOT DETECTED NOT DETECTED Final   Candida glabrata NOT DETECTED NOT DETECTED Final   Candida krusei NOT DETECTED NOT DETECTED Final   Candida parapsilosis NOT DETECTED NOT DETECTED Final   Candida tropicalis NOT DETECTED NOT DETECTED Final    Comment: Performed at [facility], [place] [address], [place], [place] [address]  Culture, Urine     Status: None   Collection Time: 08/28/16 10:33 PM  Result Value Ref Range Status   Specimen Description URINE, CLEAN CATCH  Final   Special Requests NONE  Final   Culture   Final    NO GROWTH Performed at [facility], [place] [address], [place], [place] [address]    Report Status 08/30/2016 FINAL  Final  MRSA PCR Screening     Status: None   Collection Time: 08/29/16 12:17 PM  Result Value Ref Range Status   MRSA by PCR NEGATIVE NEGATIVE Final    Comment:        The GeneXpert MRSA Assay (FDA approved for NASAL specimens only), is one component of a comprehensive MRSA colonization surveillance program. It is not intended to diagnose MRSA infection nor to guide or monitor treatment for MRSA infections.      Studies: No results found.  Scheduled Meds: . lidocaine  1 patch Transdermal Q24H   Continuous Infusions: . dextrose 5 % and 0.45% NaCl 10 mL/hr at 09/02/16 1613    Principal Problem:   Severe sepsis ([place]) Active Problems:   Pancytopenia ([place])   AML (acute myeloid leukemia) ([place])   Acute encephalopathy   Fall   Hypokalemia   Lactic acidosis   Community acquired pneumonia of left lung   HCAP (healthcare-associated pneumonia)   Bacteremia   Non-ST elevation myocardial infarction (NSTEMI), type 2 ([place])    Time spent: 25 minutes    Barton Dubois  [facility] Pager [phone]. If 7PM-7AM, please  contact night-coverage at [email or web address], password [facility] 09/03/2016, 9:43 AM  LOS: 6 days

## 2016-09-03 NOTE — Progress Notes (Signed)
Daily Progress Note   Patient Name: [name]       Date: 09/03/2016 DOB: 11-03-1935  Age: 81 y.o. MRN#: [id number] Attending Physician: Barton Dubois, MD Primary Care Physician: [name], FNP Admit Date: 08/28/2016  Reason for Consultation/Follow-up: Terminal Care  Subjective: Unresponsive Resting comfortably with no distress  Met today in room with patient's son and daughter.  Length of Stay: 6  Current Medications: Scheduled Meds:  . lidocaine  1 patch Transdermal Q24H    Continuous Infusions: . dextrose 5 % and 0.45% NaCl 10 mL/hr at 09/02/16 1613    PRN Meds: [DISCONTINUED] glycopyrrolate **OR** glycopyrrolate **OR** glycopyrrolate, [DISCONTINUED] haloperidol **OR** [DISCONTINUED] haloperidol **OR** haloperidol lactate, [DISCONTINUED] LORazepam **OR** [DISCONTINUED] LORazepam **OR** LORazepam, morphine injection, ondansetron (ZOFRAN) IV, polyvinyl alcohol  Physical Exam         General: Somnolent, in no acute distress.  Heart: Regular rate and rhythm. No murmur appreciated. Lungs: Good air movement, clear Abdomen: Soft, nontender, nondistended, positive bowel sounds.  Ext: No significant edema Skin: Warm and dry Neuro: unresponsive  Vital Signs: BP (!) 112/53 (BP Location: Right Arm)   Pulse 76   Temp 97.6 F (36.4 C) (Oral)   Resp 20   Ht '5\' 8"'$  (1.727 m)   Wt 66 kg (145 lb 8.1 oz)   SpO2 (!) 86%   BMI 22.12 kg/m  SpO2: SpO2: (!) 86 % O2 Device: O2 Device: Nasal Cannula O2 Flow Rate: O2 Flow Rate (L/min): 2 L/min  Intake/output summary: No intake or output data in the 24 hours ending 09/03/16 2153 LBM: Last BM Date: 08/31/16 Baseline Weight: Weight: 66 kg (145 lb 8.1 oz) Most recent weight: Weight: 66 kg (145 lb 8.1 oz)       Palliative  Assessment/Data:    Flowsheet Rows   Flowsheet Row Most Recent Value  Intake Tab  Referral Department  Hospitalist  Unit at Time of Referral  Intermediate Care Unit  Palliative Care Primary Diagnosis  Cancer  Date Notified  09/02/16  Palliative Care Type  New Palliative care  Reason for referral  End of Life Care Assistance  Date of Admission  08/28/16  Date first seen by Palliative Care  09/02/16  # of days Palliative referral response time  0 Day(s)  # of days IP prior to Palliative referral  5  Clinical Assessment  Palliative Performance Scale Score  10%  Pain Max last 24 hours  4  Pain Min Last 24 hours  3  Dyspnea Max Last 24 Hours  5  Dyspnea Min Last 24 hours  4  Nausea Max Last 24 Hours  3  Nausea Min Last 24 Hours  2  Psychosocial & Spiritual Assessment  Palliative Care Outcomes  Patient/Family meeting held?  Yes  Who was at the meeting?  2 daughters   Palliative Care Outcomes  Clarified goals of care      Patient Active Problem List   Diagnosis Date Noted  . Non-ST elevation myocardial infarction (NSTEMI), type 2 ([place])   . Community acquired pneumonia of left lung   . HCAP (healthcare-associated pneumonia)   . Bacteremia   . Severe sepsis ([place]) 08/28/2016  . Acute encephalopathy 08/28/2016  . Fall 08/28/2016  . Hypokalemia 08/28/2016  . Lactic acidosis 08/28/2016  . Counseling regarding goals of care 06/05/2016  . Routine general medical examination at a health care facility 04/05/2016  . Medicare annual wellness visit, subsequent 04/05/2016  . Myelodysplasia (myelodysplastic syndrome) ([place]) 03/11/2016  . 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 Constipation 07/12/2015  . Anemia 07/12/2015  . Chemotherapy induced nausea and vomiting 07/12/2015  . Thrombocytopenia ([place]) 07/04/2015  . AML (acute myeloid leukemia) ([place]) 06/02/2015  . Pancytopenia ([place]) 05/23/2015  . AKI (acute kidney injury) ([place]) 05/23/2015  . HTN (hypertension) 05/23/2015  . Chronic constipation  05/23/2015  . Lumbar and sacral arthritis 05/01/2015  . Depression 04/20/2015  . Dyspnea 04/26/2014  . Bilateral carotid bruits 04/26/2014  . Expected blood loss anemia 04/28/2012  . Overweight (BMI 25.0-29.9) 04/28/2012  . Hyponatremia 04/28/2012  . S/P right TKA 04/27/2012  . 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 Found to have sepsis and neutropenic fever. Also with acute resp failure with hypoxia due to HCAP.  Now comfort care.  Recommendations/Plan:  DNR/DNI. Full comfort care  Continue current comfort regimen  Anticipatory counseling provided to family.  Family agreeable to transition to residential hospice if she becomes stable enough to consider transfer. I do not think this is likely to be the case.  Will reevaluate tomorrow and family reports that they would like to tour facilities prior to making decision.   Goals of Care and Additional Recommendations:  Limitations on Scope of Treatment: Full Comfort Care  Code Status:    Code Status Orders        Start     Ordered   09/02/16 0925  Do not attempt resuscitation (DNR)  Continuous    Question Answer Comment  In the event of cardiac or respiratory ARREST Do not call a "code blue"   In the event of cardiac or respiratory ARREST Do not perform Intubation, CPR, defibrillation or ACLS   In the event of cardiac or respiratory ARREST Use medication by any route, position, wound care, and other measures to relive pain and suffering. May use oxygen, suction and manual treatment of airway obstruction as needed for comfort.      09/02/16 0930    Code Status History    Date Active Date Inactive Code Status Order ID Comments User Context   08/28/2016  9:45 PM 09/02/2016   9:30 AM DNR [id number]  Nikki  Eulas Post, MD ED   05/23/2015  6:07 PM 05/26/2015  7:17 PM Full Code [id number]  Samella Parr, NP Inpatient   04/27/2012  4:12 PM 04/30/2012  5:57 PM Full Code [id number]  Diamantina Monks, RN Inpatient   09/13/2011  5:06 PM 09/15/2011  7:15 PM Full Code [id number]  Rande Lawman, RN Inpatient    Advance Directive Documentation   Flowsheet Row Most Recent Value  Type of Advance Directive  Out of facility DNR (pink MOST or yellow form), Healthcare Power of [place], Living will [copy of yellow dnr at bedside]  Pre-existing out of facility DNR order (yellow form or pink MOST form)  No data  "MOST" Form in Place?  No data       Prognosis:   Hours - Days  Discharge Planning:  Anticipated Hospital Death  Care plan was discussed with son and daughter.  Thank you for allowing the Palliative Medicine Team to assist in the care of this patient.   Total Time 30 Prolonged Time Billed No      Greater than 50%  of this time was spent counseling and coordinating care related to the above assessment and plan.  Micheline Rough, MD  Please contact Palliative Medicine Team phone at [phone] for questions and concerns.

## 2016-09-04 DIAGNOSIS — C9200 Acute myeloblastic leukemia, not having achieved remission: Secondary | ICD-10-CM

## 2016-09-04 DIAGNOSIS — A4159 Other Gram-negative sepsis: Secondary | ICD-10-CM

## 2016-09-04 DIAGNOSIS — R7881 Bacteremia: Secondary | ICD-10-CM

## 2016-09-04 DIAGNOSIS — R6520 Severe sepsis without septic shock: Secondary | ICD-10-CM

## 2016-09-04 DIAGNOSIS — G9340 Encephalopathy, unspecified: Secondary | ICD-10-CM

## 2016-09-04 DIAGNOSIS — A414 Sepsis due to anaerobes: Secondary | ICD-10-CM

## 2016-09-04 DIAGNOSIS — J189 Pneumonia, unspecified organism: Secondary | ICD-10-CM

## 2016-09-04 MED ORDER — MORPHINE SULFATE (CONCENTRATE) 10 MG/0.5ML PO SOLN: 4.0000 mg | ORAL | 0 refills | Status: AC | PRN

## 2016-09-04 MED ORDER — LORAZEPAM 2 MG/ML PO CONC: 1.0000 mg | ORAL | 0 refills | Status: AC | PRN

## 2016-09-04 MED ORDER — GLYCOPYRROLATE 0.2 MG/ML IJ SOLN: 0.2000 mg | INTRAMUSCULAR | 0 refills | Status: AC | PRN

## 2016-09-04 NOTE — Consult Note (Signed)
[facility] room available for patient tomorrow, 09/05/16. Met with patient's two daughters to complete registration visit in anticipation of transfer tomorrow. Dr. Orpah Melter to assume care per family preference. Daughters wish to be present at time of transport.   Please fax discharge summary to [phone].  RN please call report to [phone].  Thank you,  Erling Conte, LCSW [phone]

## 2016-09-04 NOTE — Progress Notes (Signed)
[facility] Liaison RN visit  Received request from [place], [place] for family interest in [facility] with request for transfer tomorrow.  Chart reviewed.  Patient eligible.  Met with patient and family to confirm interest and explain services.  Family agreeable to transfer tomorrow.  CSW aware.  Please fax discharge summary to [phone].  RN, please call report to [phone].  Please arrange for transport for patient to arrive tomorrow as soon as possible.    Thank you,  Edyth Gunnels, RN, [facility] Liaison [phone]  All hospital liaison's are now on [place].

## 2016-09-04 NOTE — Progress Notes (Signed)
Per order patient should have foley r/t end of life care, attempt x3 unsuccessful by assigned RN and Charge RN, will continue to monitor.

## 2016-09-04 NOTE — Clinical Social Work Note (Signed)
Clinical Social Work Assessment  Patient Details  Name: [name] MRN: [id number] Date of Birth: 11-06-1935  Date of referral:  09/04/16               Reason for consult:  End of Life/Hospice                Permission sought to share information with:  Chartered certified accountant granted to share information::  Yes, Verbal Permission Granted  Name::        Agency::     Relationship::     Contact Information:     Housing/Transportation Living arrangements for the past 2 months:  Single Family Home Source of Information:  Adult Children Patient Interpreter Needed:  None Criminal Activity/Legal Involvement Pertinent to Current Situation/Hospitalization:  No - Comment as needed Significant Relationships:  Adult Children Lives with:    Do you feel safe going back to the place where you live?  No Need for family participation in patient care:  Yes (Comment)  Care giving concerns: Pt's care cannot be managed at home following hospital d/c.   Social Worker assessment / plan:  Pt hospitalized from home on 08/28/16 with sepsis. Palliative Care Team has been following. CSW consulted to assist with residential hospice home placement. CSW met with family at bedside to provide hospice home choice. Family has requested [facility] for placement. Referral has been provided and a decision is pending. Family has been updated. CSW will continue to follow to assist with d/c planning needs.  Employment status:  Retired Nurse, adult PT Recommendations:  Not assessed at this time Information / Referral to community resources:  Other (Comment Required) (hospice homes)  Patient/Family's Response to care:  Family requesting [facility] for end of life care.  Patient/Family's Understanding of and Emotional Response to Diagnosis, Current Treatment, and Prognosis:  Family is aware of pt's medical status and are in agreement with residential hospice home  placement. Family appreciates CSW assistance with this process.  Emotional Assessment Appearance:  Appears stated age Attitude/Demeanor/Rapport:  Unable to Assess Affect (typically observed):  Unable to Assess Orientation:   (disoriented x 4) Alcohol / Substance use:  Not Applicable Psych involvement (Current and /or in the community):  No (Comment)  Discharge Needs  Concerns to be addressed:  Discharge Planning Concerns Readmission within the last 30 days:  No Current discharge risk:  None Barriers to Discharge:  No Barriers Identified   [name], Index 09/04/2016, 2:00 PM

## 2016-09-04 NOTE — Progress Notes (Signed)
Daily Progress Note   Patient Name: [name]       Date: 09/04/2016 DOB: 02-Nov-1935  Age: 81 y.o. MRN#: [id number] Attending Physician: Orson Eva, MD Primary Care Physician: [name], FNP Admit Date: 08/28/2016  Reason for Consultation/Follow-up: Terminal Care  Subjective: Unresponsive Resting comfortably with no distress.  No significant change from encounter yesterday.  Met today in room with patient's daughter.  They have met with Dr. Carles Collet as well as [facility].  Plan to [facility].  Length of Stay: 7  Current Medications: Scheduled Meds:  . lidocaine  1 patch Transdermal Q24H    Continuous Infusions: . dextrose 5 % and 0.45% NaCl 10 mL/hr at 09/02/16 1613    PRN Meds: [DISCONTINUED] glycopyrrolate **OR** glycopyrrolate **OR** glycopyrrolate, [DISCONTINUED] haloperidol **OR** [DISCONTINUED] haloperidol **OR** haloperidol lactate, [DISCONTINUED] LORazepam **OR** [DISCONTINUED] LORazepam **OR** LORazepam, morphine injection, ondansetron (ZOFRAN) IV, polyvinyl alcohol  Physical Exam         General: Somnolent, in no acute distress.  Heart: Regular rate and rhythm. No murmur appreciated. Lungs: Good air movement, clear Abdomen: Soft, nontender, nondistended, positive bowel sounds.  Ext: No significant edema Skin: Warm and dry Neuro: unresponsive  Vital Signs: BP 129/72 (BP Location: Right Arm)   Pulse 77   Temp 97.8 F (36.6 C) (Oral)   Resp 16   Ht '5\' 8"'$  (1.727 m)   Wt 66 kg (145 lb 8.1 oz)   SpO2 93%   BMI 22.12 kg/m  SpO2: SpO2: 93 % O2 Device: O2 Device: Nasal Cannula O2 Flow Rate: O2 Flow Rate (L/min): 2 L/min  Intake/output summary:   Intake/Output Summary (Last 24 hours) at 09/04/16 2139 Last data filed at 09/04/16 1658  Gross per 24  hour  Intake            523.5 ml  Output                0 ml  Net            523.5 ml   LBM: Last BM Date: 09/02/16 Baseline Weight: Weight: 66 kg (145 lb 8.1 oz) Most recent weight: Weight: 66 kg (145 lb 8.1 oz)       Palliative Assessment/Data:    Flowsheet Rows   Flowsheet Row Most Recent Value  Intake Tab  Referral Department  Hospitalist  Unit at Time of Referral  Intermediate Care Unit  Palliative Care Primary Diagnosis  Cancer  Date Notified  09/02/16  Palliative Care Type  New Palliative care  Reason for referral  End of Life Care Assistance  Date of Admission  08/28/16  Date first seen by Palliative Care  09/02/16  # of days Palliative referral response time  0 Day(s)  # of days IP prior to Palliative referral  5  Clinical Assessment  Palliative Performance Scale Score  10%  Pain Max last 24 hours  4  Pain Min Last 24 hours  3  Dyspnea Max Last 24 Hours  5  Dyspnea Min Last 24 hours  4  Nausea Max Last 24 Hours  3  Nausea Min Last 24 Hours  2  Psychosocial & Spiritual Assessment  Palliative Care Outcomes  Patient/Family meeting held?  Yes  Who was at the meeting?  2 daughters   Palliative Care Outcomes  Clarified goals of care      Patient Active Problem List   Diagnosis Date Noted  . Klebsiella sepsis ([place]) 09/04/2016  . Acute myeloid leukemia not having achieved remission ([place])   . Non-ST elevation myocardial infarction (NSTEMI), type 2 ([place])   . Community acquired pneumonia of left lung   . HCAP (healthcare-associated pneumonia)   . Bacteremia   . Severe sepsis ([place]) 08/28/2016  . Acute encephalopathy 08/28/2016  . Fall 08/28/2016  . Hypokalemia 08/28/2016  . Lactic acidosis 08/28/2016  . Counseling regarding goals of care 06/05/2016  . Routine general medical examination at a health care facility 04/05/2016  . Medicare annual wellness visit, subsequent 04/05/2016  . Myelodysplasia (myelodysplastic syndrome) ([place]) 03/11/2016  . 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 Constipation 07/12/2015  . Anemia 07/12/2015  . Chemotherapy induced nausea and vomiting 07/12/2015  . Thrombocytopenia ([place]) 07/04/2015  . AML (acute myeloid leukemia) ([place]) 06/02/2015  . Pancytopenia ([place]) 05/23/2015  . AKI (acute kidney injury) ([place]) 05/23/2015  . HTN (hypertension) 05/23/2015  . Chronic constipation 05/23/2015  . Lumbar and sacral arthritis 05/01/2015  . Depression 04/20/2015  . Dyspnea 04/26/2014  . Bilateral carotid bruits 04/26/2014  . Expected blood loss anemia 04/28/2012  . Overweight (BMI 25.0-29.9) 04/28/2012  . Hyponatremia 04/28/2012  . S/P right TKA 04/27/2012  . 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 Found to have sepsis and neutropenic fever. Also with acute resp failure with hypoxia due to HCAP.  Now comfort care.  Recommendations/Plan:  DNR/DNI. Full comfort care  Continue current comfort regimen  Anticipatory counseling provided to family.  Family would like to pursue transition to residential hospice. Plan to [facility].   Goals of Care and Additional Recommendations:  Limitations on Scope of Treatment: Full Comfort Care  Code Status:    Code Status Orders        Start     Ordered   09/02/16 0925  Do not attempt resuscitation (DNR)  Continuous    Question Answer Comment  In the event of cardiac or respiratory ARREST Do not call a "code blue"   In the event of cardiac or respiratory ARREST Do not perform Intubation, CPR, defibrillation or ACLS   In the event of cardiac or respiratory ARREST Use medication by any route, position,  wound care, and other measures to relive pain and suffering. May use oxygen, suction and manual treatment of airway  obstruction as needed for comfort.      09/02/16 0930    Code Status History    Date Active Date Inactive Code Status Order ID Comments User Context   08/28/2016  9:45 PM 09/02/2016  9:30 AM DNR [id number]  Lily Kocher, MD ED   05/23/2015  6:07 PM 05/26/2015  7:17 PM Full Code [id number]  Samella Parr, NP Inpatient   04/27/2012  4:12 PM 04/30/2012  5:57 PM Full Code [id number]  Diamantina Monks, RN Inpatient   09/13/2011  5:06 PM 09/15/2011  7:15 PM Full Code [id number]  Rande Lawman, RN Inpatient    Advance Directive Documentation   Flowsheet Row Most Recent Value  Type of Advance Directive  Out of facility DNR (pink MOST or yellow form), Healthcare Power of [place], Living will [copy of yellow dnr at bedside]  Pre-existing out of facility DNR order (yellow form or pink MOST form)  No data  "MOST" Form in Place?  No data       Prognosis:   Hours - Days  Discharge Planning:  Hospice facility  Care plan was discussed with son and daughter.  Thank you for allowing the Palliative Medicine Team to assist in the care of this patient.   Total Time 20 Prolonged Time Billed No      Greater than 50%  of this time was spent counseling and coordinating care related to the above assessment and plan.  Micheline Rough, MD  Please contact Palliative Medicine Team phone at [phone] for questions and concerns.

## 2016-09-04 NOTE — Progress Notes (Signed)
Went in patient's room to assess to see if she was wet and patient's daughter stated that she had rethreaded and redressed the patient's IV as she was a Marine scientist.  IVF disconnected.  Will continue to monitor patient.

## 2016-09-04 NOTE — Discharge Summary (Signed)
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 Found to have sepsis and neutropenic fever. Also with acute resp failure with hypoxia due to HCAP.    Palliative medicine consulted.  They spoke with family after which focus of care was changed to that focused on full comfort.  Pt remained stable.  After discussion with family, they are agreeable for residential hospice  Discharge Diagnoses:   Sepsis secondary to HCAP and Klebsiella bacteremia --given neg MRSA PCR and available culture info, vancomycin was discontinued --Continue Neutropenic precautions (neutrophils continue to be 0.0) --Blood cultures grew Klebsiella Oxytoca, Following sensitivity abx's narrowed to rocephin and as per discussed with family no further antibiotic to be provided now.   --Urine streptococcal antigen neg --repeat lactic acid 15.4 >>10.7 >>6.5>>4.6 (last one checked) --Flu screen neg --overall sepsis features/numbers improved; but patient with overall worsening clinical status and very poor prognosis (now essentially non-responsive and no tolerating PO's). --will remains comfort feeding --Initially anticipated hospital death; but pt remained stable clinically for numerous days.  As a result, family agreeable for transfer to residential hospice  Acute hypoxic respiratory failure secondary to sepsis, HCAP --off BiPAP now for 72 hours; so far well tolerated --will follow resp function status and focus on comfort/symptomatic  management  --Lasix 20mg  IV one time given after aggressive IVF's in ED; patient with signs of vascular congestion on x-ray and crackles on exam. No further lasix given elevated lactic acid, no further acute signs of vascular congestion and in the setting of sepsis. --will change IVF's rate to 20cc/hr (intended to maintain IV open and facilitate medication therapy. --after further discussion with family; no further antibiotics or interventions. Full comfort care now.  Pancytopenia secondary to history of AML,now transfusion dependent --Neutropenic precautions kept during hopsitalization (ANC zero on admission and continue to be zero) --received IV antibiotics broad spectrum for 6 days; now narrow to rocephin after discussing with family, will like to complete abx therapy (4 more doses pending); if able tolerated otherwise. --after 1 unit of platelets platelets went up to 28K; and back to 6 now. No further blood transfusion decided with family. Also unable to tolerate PO's. So will stop Amicar. --Hgb 11.2, received 2 units of PRBC's during this hospitalization  --STAT head CT and CT A/P without acute abnormality and no signs of bleeding or acute abnormality. --will focus now on full comfort care only.  Hypokalemia --repleted for last time on 3/12 --now will focus on comfort care and no further labs with be draw  --avoid nephrotoxic agents --after [place] meeting, will focus on full comfort care now --no further blood draws --pre-renal azotemia from dehydration as main cause; low perfusion with sepsis contributing as well.  Elevated troponin/non sustained SVT --was discussed with cardiology service, recommendations to follow troponin trend and to check 2-D echo; now with results, recommending to comfort care only. --patient with most likely type II NSTEMI secondary to severe sepsis. Profound anemia contributing. --also worsening renal function on admission contributing to abnormal  troponin --no CP ever expressed --2-D echo with decrease EF (new from last echo) and with wall motion  abnormalities --patient very frail and not amenable for cardiac intervention.  Severe protein calorie malnutrition  --after discussing with family will focus on comfort care now.   Discharge Instructions  Discharge Instructions    Diet - low sodium heart healthy    Complete by:  As directed    Increase activity slowly    Complete by:  As directed      Allergies as of 09/04/2016      Reactions   Cozaar [losartan] Other (See Comments)   Cause pt to lose her taste for foods   Codeine Itching, Other (See Comments)   Makes feel crazy  PT STATES SHE ALSO CAN NOT TAKE THE SYNTHETIC CODEINES   Hydrocodone Itching   Tolerates with benadryl   Oxycodone Itching   Tetanus Toxoids Swelling, Rash      Medication List    STOP taking these medications   acetaminophen 500 MG tablet Commonly known as:  TYLENOL   aminocaproic acid 500 MG tablet Commonly known as:  AMICAR   fluconazole 200 MG tablet Commonly known as:  DIFLUCAN   magic mouthwash w/lidocaine Soln   Melatonin 5 MG Tabs   pantoprazole 40 MG tablet Commonly known as:  PROTONIX   traMADol 50 MG tablet Commonly known as:  ULTRAM   valACYclovir 1000 MG tablet Commonly known as:  VALTREX   Venlafaxine HCl 150 MG Tb24     TAKE these medications   glycopyrrolate 0.2 MG/ML injection Commonly known as:  ROBINUL Inject 1 mL (0.2 mg total) into the skin every 4 (four) hours as needed (excessive secretions).   lidocaine 5 % Commonly known as:  LIDODERM Place 1 patch onto the skin daily. Remove & Discard patch within 12 hours or as directed by MD   LORazepam 2 MG/ML concentrated solution Commonly known as:  ATIVAN Take 0.5 mLs (1 mg total) by mouth every 4 (four) hours as needed for anxiety.   morphine CONCENTRATE 10 MG/0.5ML Soln concentrated solution Take 0.2 mLs (4 mg total) by mouth every 2 (two) hours as  needed for severe pain or shortness of breath.       Allergies  Allergen Reactions  . Cozaar [Losartan] Other (See Comments)    Cause pt to lose her taste for foods  . Codeine Itching and Other (See Comments)    Makes feel crazy  PT STATES SHE ALSO CAN NOT TAKE THE SYNTHETIC CODEINES  . Hydrocodone Itching    Tolerates with benadryl  . Oxycodone Itching  . Tetanus Toxoids Swelling and Rash    Consultations: Med Onc--[facility] Palliative medicine  Procedures/Studies: Ct Abdomen Pelvis Wo Contrast  Result Date: 08/28/2016 CLINICAL DATA:  Altered mental status with fever. Daughter noticed blood in the patient's briefs. EXAM: CT ABDOMEN AND PELVIS WITHOUT CONTRAST TECHNIQUE: Multidetector CT imaging of the abdomen and pelvis was performed following the standard protocol without IV contrast. COMPARISON:  04/17/2010 pelvic CT FINDINGS: Lower chest: Borderline cardiomegaly without pericardial effusion. Large hiatal hernia. Patchy airspace opacities at each lung base. Pneumonia is not excluded. Tiny nodular densities noted in the left lower lobe measuring up to 5 mm. No pleural effusion or pneumothorax. Hepatobiliary: No focal liver abnormality is seen. No gallstones, gallbladder wall thickening, or biliary dilatation. Pancreas: No pancreatic mass or ductal dilatation. Due to motion artifacts, assessment for pancreatic inflammation is compromised. Spleen: Normal in size without focal abnormality. Adrenals/Urinary Tract: No adrenal nodule. Mild left adrenal gland thickening. No nephrolithiasis or renal mass. No hydroureteronephrosis. Distended bladder without focal  mural abnormality or calculus. Stomach/Bowel: Normal small bowel rotation. No bowel obstruction. Moderate colonic stool burden. There is left-sided colonic diverticulosis. The appendix is not visualized. No definite cecal inflammation however. Vascular/Lymphatic: Aorto bi-iliac atherosclerosis without aneurysm. No lymphadenopathy.  Reproductive: Uterus and bilateral adnexa are unremarkable. Other: No abdominal wall hernia or abnormality. No abdominopelvic ascites. Musculoskeletal: Chronic insufficiency fracture of the right sacral ala is not well visualized on current exam. Osteoarthritis of the sacroiliac joints. Lower lumbar facet arthropathy. Dextroscoliosis of the thoracolumbar junction. No acute osseous appearing abnormality. IMPRESSION: 1. Large hiatal hernia. 2. Patchy airspace opacities within both visualized lower lobes. Pneumonia is not entirely excluded. Tiny 5 mm or less nodular densities in the medial left lower lobe adjacent atelectatic lung may be postinfectious or postinflammatory in etiology. Pulmonary nodules are not entirely excluded. No follow-up needed if patient is low-risk (and has no known or suspected primary neoplasm). Non-contrast chest CT can be considered in 12 months if patient is high-risk. This recommendation follows the consensus statement: Guidelines for Management of Incidental Pulmonary Nodules Detected on CT Images: From the Fleischner Society 2017; Radiology 2017; 284:228-243. 3. Colonic diverticulosis without acute diverticulitis. 4. Aorto bi-iliac atherosclerosis without aneurysm. Electronically Signed   By: Ashley Royalty M.D.   On: 08/28/2016 22:51   Dg Chest 2 View  Result Date: 08/28/2016 CLINICAL DATA:  81 y/o  F; altered mental status and fever. EXAM: CHEST  2 VIEW COMPARISON:  06/01/2014 chest radiograph. FINDINGS: Stable cardiac silhouette given projection and technique. And aortic atherosclerosis with calcification. Left shoulder reverse arthroplasty partially visualized. No acute osseous abnormality. Left lower lobe consolidation and streaky airspace opacities in left mid lung zone. IMPRESSION: Left lower lobe consolidation likely represents pneumonia. These results will be called to the ordering clinician or representative by the Radiologist Assistant, and communication documented in the PACS  or zVision Dashboard. Electronically Signed   By: Kristine Garbe M.D.   On: 08/28/2016 18:01   Ct Head Wo Contrast  Result Date: 08/28/2016 CLINICAL DATA:  Altered mental status.  Nonverbal. EXAM: CT HEAD WITHOUT CONTRAST TECHNIQUE: Contiguous axial images were obtained from the base of the skull through the vertex without intravenous contrast. COMPARISON:  None. FINDINGS: Study is slightly limited by patient motion. BRAIN: There is sulcal and ventricular prominence consistent with superficial and central atrophy. No intraparenchymal hemorrhage, mass effect nor midline shift. Periventricular and subcortical white matter hypodensities consistent with chronic small vessel ischemic disease are identified. No acute large vascular territory infarcts. No abnormal extra-axial fluid collections. Basal cisterns are not effaced and midline. VASCULAR: Mild-to-moderate calcific atherosclerosis of the carotid siphons. SKULL: No skull fracture. No significant scalp soft tissue swelling. SINUSES/ORBITS: The mastoid air-cells are clear. The included paranasal sinuses are well-aerated.The included ocular globes and orbital contents are non-suspicious. OTHER: None. IMPRESSION: Chronic appearing small vessel ischemic disease.  Cerebral atrophy. Electronically Signed   By: Ashley Royalty M.D.   On: 08/28/2016 22:40        Discharge Exam: Vitals:   09/03/16 0900 09/04/16 1016  BP: (!) 112/53 129/72  Pulse: 76 77  Resp: 20 16  Temp:  97.8 F (36.6 C)   Vitals:   09/02/16 1200 09/02/16 1445 09/03/16 0900 09/04/16 1016  BP: (!) 149/75 (!) 149/97 (!) 112/53 129/72  Pulse: 82 72 76 77  Resp: 20 18 20 16   Temp:   97.6 F (36.4 C) 97.8 F (36.6 C)  TempSrc:   Oral Oral  SpO2: 94% 96% (!) 86% 93%  Weight:      Height:        General: Pt is alert,  not in acute distress Cardiovascular: RRR, S1/S2 +, no rubs, no gallops Respiratory: bilateral crackles, no wheeze Abdominal: Soft, NT, ND, bowel sounds  + Extremities: trace edema, no cyanosis   The results of significant diagnostics from this hospitalization (including imaging, microbiology, ancillary and laboratory) are listed below for reference.    Significant Diagnostic Studies: Ct Abdomen Pelvis Wo Contrast  Result Date: 08/28/2016 CLINICAL DATA:  Altered mental status with fever. Daughter noticed blood in the patient's briefs. EXAM: CT ABDOMEN AND PELVIS WITHOUT CONTRAST TECHNIQUE: Multidetector CT imaging of the abdomen and pelvis was performed following the standard protocol without IV contrast. COMPARISON:  04/17/2010 pelvic CT FINDINGS: Lower chest: Borderline cardiomegaly without pericardial effusion. Large hiatal hernia. Patchy airspace opacities at each lung base. Pneumonia is not excluded. Tiny nodular densities noted in the left lower lobe measuring up to 5 mm. No pleural effusion or pneumothorax. Hepatobiliary: No focal liver abnormality is seen. No gallstones, gallbladder wall thickening, or biliary dilatation. Pancreas: No pancreatic mass or ductal dilatation. Due to motion artifacts, assessment for pancreatic inflammation is compromised. Spleen: Normal in size without focal abnormality. Adrenals/Urinary Tract: No adrenal nodule. Mild left adrenal gland thickening. No nephrolithiasis or renal mass. No hydroureteronephrosis. Distended bladder without focal mural abnormality or calculus. Stomach/Bowel: Normal small bowel rotation. No bowel obstruction. Moderate colonic stool burden. There is left-sided colonic diverticulosis. The appendix is not visualized. No definite cecal inflammation however. Vascular/Lymphatic: Aorto bi-iliac atherosclerosis without aneurysm. No lymphadenopathy. Reproductive: Uterus and bilateral adnexa are unremarkable. Other: No abdominal wall hernia or abnormality. No abdominopelvic ascites. Musculoskeletal: Chronic insufficiency fracture of the right sacral ala is not well visualized on current exam. Osteoarthritis  of the sacroiliac joints. Lower lumbar facet arthropathy. Dextroscoliosis of the thoracolumbar junction. No acute osseous appearing abnormality. IMPRESSION: 1. Large hiatal hernia. 2. Patchy airspace opacities within both visualized lower lobes. Pneumonia is not entirely excluded. Tiny 5 mm or less nodular densities in the medial left lower lobe adjacent atelectatic lung may be postinfectious or postinflammatory in etiology. Pulmonary nodules are not entirely excluded. No follow-up needed if patient is low-risk (and has no known or suspected primary neoplasm). Non-contrast chest CT can be considered in 12 months if patient is high-risk. This recommendation follows the consensus statement: Guidelines for Management of Incidental Pulmonary Nodules Detected on CT Images: From the Fleischner Society 2017; Radiology 2017; 284:228-243. 3. Colonic diverticulosis without acute diverticulitis. 4. Aorto bi-iliac atherosclerosis without aneurysm. Electronically Signed   By: Ashley Royalty M.D.   On: 08/28/2016 22:51   Dg Chest 2 View  Result Date: 08/28/2016 CLINICAL DATA:  81 y/o  F; altered mental status and fever. EXAM: CHEST  2 VIEW COMPARISON:  06/01/2014 chest radiograph. FINDINGS: Stable cardiac silhouette given projection and technique. And aortic atherosclerosis with calcification. Left shoulder reverse arthroplasty partially visualized. No acute osseous abnormality. Left lower lobe consolidation and streaky airspace opacities in left mid lung zone. IMPRESSION: Left lower lobe consolidation likely represents pneumonia. These results will be called to the ordering clinician or representative by the Radiologist Assistant, and communication documented in the PACS or zVision Dashboard. Electronically Signed   By: Kristine Garbe M.D.   On: 08/28/2016 18:01   Ct Head Wo Contrast  Result Date: 08/28/2016 CLINICAL DATA:  Altered mental status.  Nonverbal. EXAM: CT HEAD WITHOUT CONTRAST TECHNIQUE: Contiguous axial  images were obtained from the base of the skull  through the vertex without intravenous contrast. COMPARISON:  None. FINDINGS: Study is slightly limited by patient motion. BRAIN: There is sulcal and ventricular prominence consistent with superficial and central atrophy. No intraparenchymal hemorrhage, mass effect nor midline shift. Periventricular and subcortical white matter hypodensities consistent with chronic small vessel ischemic disease are identified. No acute large vascular territory infarcts. No abnormal extra-axial fluid collections. Basal cisterns are not effaced and midline. VASCULAR: Mild-to-moderate calcific atherosclerosis of the carotid siphons. SKULL: No skull fracture. No significant scalp soft tissue swelling. SINUSES/ORBITS: The mastoid air-cells are clear. The included paranasal sinuses are well-aerated.The included ocular globes and orbital contents are non-suspicious. OTHER: None. IMPRESSION: Chronic appearing small vessel ischemic disease.  Cerebral atrophy. Electronically Signed   By: Ashley Royalty M.D.   On: 08/28/2016 22:40     Microbiology: Recent Results (from the past 240 hour(s))  Blood Culture (routine x 2)     Status: Abnormal   Collection Time: 08/28/16  5:30 PM  Result Value Ref Range Status   Specimen Description BLOOD LEFT WRIST  Final   Special Requests BOTTLES DRAWN AEROBIC ONLY 5CC  Final   Culture  Setup Time   Final    GRAM NEGATIVE RODS AEROBIC BOTTLE ONLY CRITICAL VALUE NOTED.  VALUE IS CONSISTENT WITH PREVIOUSLY REPORTED AND CALLED VALUE.    Culture (A)  Final    KLEBSIELLA OXYTOCA SUSCEPTIBILITIES PERFORMED ON PREVIOUS CULTURE WITHIN THE LAST 5 DAYS. Performed at [facility], [place] [address], [place], [place] [address]    Report Status 08/31/2016 FINAL  Final  Blood Culture (routine x 2)     Status: Abnormal   Collection Time: 08/28/16  5:39 PM  Result Value Ref Range Status   Specimen Description BLOOD RIGHT WRIST  Final   Special Requests  BOTTLES DRAWN AEROBIC AND ANAEROBIC 5CC  Final   Culture  Setup Time   Final    GRAM NEGATIVE RODS IN BOTH AEROBIC AND ANAEROBIC BOTTLES CRITICAL RESULT CALLED TO, READ BACK BY AND VERIFIED WITH: J. Scherrie November Pharm.D. 11:15 08/29/16 ([email or web address]) Performed at [facility], [place] [address] [place], [place] [address]    Culture KLEBSIELLA OXYTOCA (A)  Final   Report Status 08/31/2016 FINAL  Final   Organism ID, Bacteria KLEBSIELLA OXYTOCA  Final      Susceptibility   Klebsiella oxytoca - MIC*    AMPICILLIN >=32 RESISTANT Resistant     CEFAZOLIN 8 SENSITIVE Sensitive     CEFEPIME <=1 SENSITIVE Sensitive     CEFTAZIDIME <=1 SENSITIVE Sensitive     CEFTRIAXONE <=1 SENSITIVE Sensitive     CIPROFLOXACIN <=0.25 SENSITIVE Sensitive     GENTAMICIN <=1 SENSITIVE Sensitive     IMIPENEM <=0.25 SENSITIVE Sensitive     TRIMETH/SULFA <=20 SENSITIVE Sensitive     AMPICILLIN/SULBACTAM 16 INTERMEDIATE Intermediate     PIP/TAZO <=4 SENSITIVE Sensitive     Extended ESBL NEGATIVE Sensitive     * KLEBSIELLA OXYTOCA  Blood Culture ID Panel (Reflexed)     Status: Abnormal   Collection Time: 08/28/16  5:39 PM  Result Value Ref Range Status   Enterococcus species NOT DETECTED NOT DETECTED Final   Listeria monocytogenes NOT DETECTED NOT DETECTED Final   Staphylococcus species NOT DETECTED NOT DETECTED Final   Staphylococcus aureus NOT DETECTED NOT DETECTED Final   Streptococcus species NOT DETECTED NOT DETECTED Final   Streptococcus agalactiae NOT DETECTED NOT DETECTED Final   Streptococcus pneumoniae NOT DETECTED NOT DETECTED Final   Streptococcus pyogenes NOT  DETECTED NOT DETECTED Final   Acinetobacter baumannii NOT DETECTED NOT DETECTED Final   Enterobacteriaceae species DETECTED (A) NOT DETECTED Final    Comment: Enterobacteriaceae represent a large family of gram-negative bacteria, not a single organism. CRITICAL RESULT CALLED TO, READ BACK BY AND VERIFIED WITH: J. Scherrie November Pharm.D. 11:15 08/29/16  ([email or web address])    Enterobacter cloacae complex NOT DETECTED NOT DETECTED Final   Escherichia coli NOT DETECTED NOT DETECTED Final   Klebsiella oxytoca DETECTED (A) NOT DETECTED Final    Comment: CRITICAL RESULT CALLED TO, READ BACK BY AND VERIFIED WITH: J. Scherrie November Pharm.D. 11:15 08/29/16 ([email or web address])    Klebsiella pneumoniae NOT DETECTED NOT DETECTED Final   Proteus species NOT DETECTED NOT DETECTED Final   Serratia marcescens NOT DETECTED NOT DETECTED Final   Carbapenem resistance NOT DETECTED NOT DETECTED Final   Haemophilus influenzae NOT DETECTED NOT DETECTED Final   Neisseria meningitidis NOT DETECTED NOT DETECTED Final   Pseudomonas aeruginosa NOT DETECTED NOT DETECTED Final   Candida albicans NOT DETECTED NOT DETECTED Final   Candida glabrata NOT DETECTED NOT DETECTED Final   Candida krusei NOT DETECTED NOT DETECTED Final   Candida parapsilosis NOT DETECTED NOT DETECTED Final   Candida tropicalis NOT DETECTED NOT DETECTED Final    Comment: Performed at [facility], [place] [address], [place], [place] [address]  Culture, Urine     Status: None   Collection Time: 08/28/16 10:33 PM  Result Value Ref Range Status   Specimen Description URINE, CLEAN CATCH  Final   Special Requests NONE  Final   Culture   Final    NO GROWTH Performed at [facility], [place] [address], [place], [place] [address]    Report Status 08/30/2016 FINAL  Final  MRSA PCR Screening     Status: None   Collection Time: 08/29/16 12:17 PM  Result Value Ref Range Status   MRSA by PCR NEGATIVE NEGATIVE Final    Comment:        The GeneXpert MRSA Assay (FDA approved for NASAL specimens only), is one component of a comprehensive MRSA colonization surveillance program. It is not intended to diagnose MRSA infection nor to guide or monitor treatment for MRSA infections.      Labs: Basic Metabolic Panel:  Recent Labs Lab 08/30/16 0801 08/30/16 2319 08/31/16 0346 09/01/16 0331 09/02/16 0400  NA 139  139 137 142 145  K 3.7 3.1* 3.4* 3.3* 2.9*  CL 108 110 110 113* 116*  CO2 14* 17* 16* 16* 18*  GLUCOSE 109* 148* 122* 156* 152*  BUN 47* 56* 55* 58* 54*  CREATININE 1.83* 1.77* 1.65* 1.49* 1.25*  CALCIUM 8.1* 8.1* 8.0* 8.9 8.8*  MG  --  1.6* 2.1  --   --    Liver Function Tests:  Recent Labs Lab 08/28/16 1745 08/28/16 2233  AST 30 37  ALT 19 19  ALKPHOS 122 99  BILITOT 1.2 1.3*  PROT 7.0 6.5  ALBUMIN 2.6* 2.1*   No results for input(s): LIPASE, AMYLASE in the last 168 hours. No results for input(s): AMMONIA in the last 168 hours. CBC:  Recent Labs Lab 08/28/16 1745 08/29/16 0410 08/30/16 0350 08/31/16 0346 09/01/16 0331 09/02/16 0400  WBC 4.7 4.2 6.9 1.7* 3.3* 3.6*  NEUTROABS 0.0* 0.0*  --  0.0*  --   --   HGB 9.6* 8.0* 7.7* 6.9* 10.9* 11.2*  HCT 27.5* 22.6* 22.4* 19.4* 30.5* 31.0*  MCV 89.0 89.7 92.9 88.2 86.2 86.4  PLT 15* 10*  6* 28* 18* 6*   Cardiac Enzymes:  Recent Labs Lab 08/30/16 2319 08/31/16 0346 08/31/16 0713  TROPONINI 9.19* 6.89* 4.70*   BNP: Invalid input(s): POCBNP CBG:  Recent Labs Lab 08/30/16 0453 08/30/16 0509 08/30/16 0618 08/30/16 0746  GLUCAP 39* 89 80 79    Time coordinating discharge:  Greater than 30 minutes  Signed:  Jonnie Kubly, DO [facility] Pager: [phone] 09/04/2016, 1:44 PM

## 2016-09-05 DIAGNOSIS — A414 Sepsis due to anaerobes: Secondary | ICD-10-CM

## 2016-09-05 MED ORDER — MORPHINE SULFATE (PF) 4 MG/ML IV SOLN
4.0000 mg | INTRAVENOUS | Status: AC
  Administered 2016-09-05: 4 mg via SUBCUTANEOUS

## 2016-09-05 NOTE — Progress Notes (Signed)
Pt is ready to dc to [facility] this am. Met with family at bedside. Family is in agreement with this plan. [facility] transport is required. Medical necessity form completed. D/C Summary sent to facility for review. Scripts included in d/c packet. # for report provided to nsg.  Werner Lean LCSW [phone]

## 2016-09-05 NOTE — Progress Notes (Signed)
Per MD, pt to dc to [facility] today. No CM needs communicated. Marney Doctor RN,BSN,NCM [phone]

## 2016-09-05 NOTE — Discharge Summary (Signed)
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 Found to have sepsis and neutropenic fever. Also with acute resp failure with hypoxia due to HCAP.    Palliative medicine consulted.  They spoke with family after which focus of care was changed to that focused on full comfort.  Pt remained stable.  After discussion with family, they are agreeable for residential hospice  Discharge Diagnoses:   Sepsis secondary to HCAP and Klebsiella bacteremia --given neg MRSA PCR and available culture info, vancomycin was discontinued --Continue Neutropenic precautions (neutrophils continue to be 0.0) --Blood cultures grew Klebsiella Oxytoca, Following sensitivity abx's narrowed to rocephin and as per discussed with family no further antibiotic to be provided now.   --Urine streptococcal antigen neg --repeat lactic acid 15.4 >>10.7 >>6.5>>4.6 (last one checked) --Flu screen neg --overall sepsis features/numbers improved; but patient with overall worsening clinical status and very poor prognosis (now essentially non-responsive and no tolerating PO's). --will remains comfort feeding --Initially anticipated hospital death; but pt remained stable clinically for numerous days.  As a result, family agreeable for transfer to residential hospice  Acute hypoxic respiratory failure secondary to sepsis, HCAP --off BiPAP now for 72 hours; so far well tolerated --will follow resp function status and focus on comfort/symptomatic  management  --Lasix 20mg  IV one time given after aggressive IVF's in ED; patient with signs of vascular congestion on x-ray and crackles on exam. No further lasix given elevated lactic acid, no further acute signs of vascular congestion and in the setting of sepsis. --will change IVF's rate to 20cc/hr (intended to maintain IV open and facilitate medication therapy. --after further discussion with family; no further antibiotics or interventions. Full comfort care now.  Pancytopenia secondary to history of AML,now transfusion dependent --Neutropenic precautions kept during hopsitalization (ANC zero on admission and continue to be zero) --received IV antibiotics broad spectrum for 6 days; now narrow to rocephin after discussing with family, will like to complete abx therapy (4 more doses pending); if able tolerated otherwise. --after 1 unit of platelets platelets went up to 28K; and back to 6 now. No further blood transfusion decided with family. Also unable to tolerate PO's. So will stop Amicar. --Hgb 11.2, received 2 units of PRBC's during this hospitalization  --STAT head CT and CT A/P without acute abnormality and no signs of bleeding or acute abnormality. --will focus now on full comfort care only.  Hypokalemia --repleted for last time on 3/12 --now will focus on comfort care and no further labs with be draw  --avoid nephrotoxic agents --after [place] meeting, will focus on full comfort care now --no further blood draws --pre-renal azotemia from dehydration as main cause; low perfusion with sepsis contributing as well.  Elevated troponin/non sustained SVT --was discussed with cardiology service, recommendations to follow troponin trend and to check 2-D echo; now with results, recommending to comfort care only. --patient with most likely type II NSTEMI secondary to severe sepsis. Profound anemia contributing. --also worsening renal function on admission contributing to abnormal  troponin --no CP ever expressed --2-D echo with decrease EF (new from last echo) and with wall motion  abnormalities --patient very frail and not amenable for cardiac intervention.  Severe protein calorie malnutrition  --after discussing with family will focus on comfort care now.   Discharge Instructions  Discharge Instructions    Diet - low sodium heart healthy    Complete by:  As directed    Increase activity slowly    Complete by:  As directed      Allergies as of 09/05/2016      Reactions   Cozaar [losartan] Other (See Comments)   Cause pt to lose her taste for foods   Codeine Itching, Other (See Comments)   Makes feel crazy  PT STATES SHE ALSO CAN NOT TAKE THE SYNTHETIC CODEINES   Hydrocodone Itching   Tolerates with benadryl   Oxycodone Itching   Tetanus Toxoids Swelling, Rash      Medication List    STOP taking these medications   acetaminophen 500 MG tablet Commonly known as:  TYLENOL   aminocaproic acid 500 MG tablet Commonly known as:  AMICAR   fluconazole 200 MG tablet Commonly known as:  DIFLUCAN   magic mouthwash w/lidocaine Soln   Melatonin 5 MG Tabs   pantoprazole 40 MG tablet Commonly known as:  PROTONIX   traMADol 50 MG tablet Commonly known as:  ULTRAM   valACYclovir 1000 MG tablet Commonly known as:  VALTREX   Venlafaxine HCl 150 MG Tb24     TAKE these medications   glycopyrrolate 0.2 MG/ML injection Commonly known as:  ROBINUL Inject 1 mL (0.2 mg total) into the skin every 4 (four) hours as needed (excessive secretions).   lidocaine 5 % Commonly known as:  LIDODERM Place 1 patch onto the skin daily. Remove & Discard patch within 12 hours or as directed by MD   LORazepam 2 MG/ML concentrated solution Commonly known as:  ATIVAN Take 0.5 mLs (1 mg total) by mouth every 4 (four) hours as needed for anxiety.   morphine CONCENTRATE 10 MG/0.5ML Soln concentrated solution Take 0.2 mLs (4 mg total) by mouth every 2 (two) hours as  needed for severe pain or shortness of breath.       Allergies  Allergen Reactions  . Cozaar [Losartan] Other (See Comments)    Cause pt to lose her taste for foods  . Codeine Itching and Other (See Comments)    Makes feel crazy  PT STATES SHE ALSO CAN NOT TAKE THE SYNTHETIC CODEINES  . Hydrocodone Itching    Tolerates with benadryl  . Oxycodone Itching  . Tetanus Toxoids Swelling and Rash    Consultations: Med Onc--[place] Palliative medicine  Procedures/Studies: Ct Abdomen Pelvis Wo Contrast  Result Date: 08/28/2016 CLINICAL DATA:  Altered mental status with fever. Daughter noticed blood in the patient's briefs. EXAM: CT ABDOMEN AND PELVIS WITHOUT CONTRAST TECHNIQUE: Multidetector CT imaging of the abdomen and pelvis was performed following the standard protocol without IV contrast. COMPARISON:  04/17/2010 pelvic CT FINDINGS: Lower chest: Borderline cardiomegaly without pericardial effusion. Large hiatal hernia. Patchy airspace opacities at each lung base. Pneumonia is not excluded. Tiny nodular densities noted in the left lower lobe measuring up to 5 mm. No pleural effusion or pneumothorax. Hepatobiliary: No focal liver abnormality is seen. No gallstones, gallbladder wall thickening, or biliary dilatation. Pancreas: No pancreatic mass or ductal dilatation. Due to motion artifacts, assessment for pancreatic inflammation is compromised. Spleen: Normal in size without focal abnormality. Adrenals/Urinary Tract: No adrenal nodule. Mild left adrenal gland thickening. No nephrolithiasis or renal mass. No hydroureteronephrosis. Distended bladder without focal  mural abnormality or calculus. Stomach/Bowel: Normal small bowel rotation. No bowel obstruction. Moderate colonic stool burden. There is left-sided colonic diverticulosis. The appendix is not visualized. No definite cecal inflammation however. Vascular/Lymphatic: Aorto bi-iliac atherosclerosis without aneurysm. No lymphadenopathy.  Reproductive: Uterus and bilateral adnexa are unremarkable. Other: No abdominal wall hernia or abnormality. No abdominopelvic ascites. Musculoskeletal: Chronic insufficiency fracture of the right sacral ala is not well visualized on current exam. Osteoarthritis of the sacroiliac joints. Lower lumbar facet arthropathy. Dextroscoliosis of the thoracolumbar junction. No acute osseous appearing abnormality. IMPRESSION: 1. Large hiatal hernia. 2. Patchy airspace opacities within both visualized lower lobes. Pneumonia is not entirely excluded. Tiny 5 mm or less nodular densities in the medial left lower lobe adjacent atelectatic lung may be postinfectious or postinflammatory in etiology. Pulmonary nodules are not entirely excluded. No follow-up needed if patient is low-risk (and has no known or suspected primary neoplasm). Non-contrast chest CT can be considered in 12 months if patient is high-risk. This recommendation follows the consensus statement: Guidelines for Management of Incidental Pulmonary Nodules Detected on CT Images: From the Fleischner Society 2017; Radiology 2017; 284:228-243. 3. Colonic diverticulosis without acute diverticulitis. 4. Aorto bi-iliac atherosclerosis without aneurysm. Electronically Signed   By: Ashley Royalty M.D.   On: 08/28/2016 22:51   Dg Chest 2 View  Result Date: 08/28/2016 CLINICAL DATA:  81 y/o  F; altered mental status and fever. EXAM: CHEST  2 VIEW COMPARISON:  06/01/2014 chest radiograph. FINDINGS: Stable cardiac silhouette given projection and technique. And aortic atherosclerosis with calcification. Left shoulder reverse arthroplasty partially visualized. No acute osseous abnormality. Left lower lobe consolidation and streaky airspace opacities in left mid lung zone. IMPRESSION: Left lower lobe consolidation likely represents pneumonia. These results will be called to the ordering clinician or representative by the Radiologist Assistant, and communication documented in the PACS  or zVision Dashboard. Electronically Signed   By: Kristine Garbe M.D.   On: 08/28/2016 18:01   Ct Head Wo Contrast  Result Date: 08/28/2016 CLINICAL DATA:  Altered mental status.  Nonverbal. EXAM: CT HEAD WITHOUT CONTRAST TECHNIQUE: Contiguous axial images were obtained from the base of the skull through the vertex without intravenous contrast. COMPARISON:  None. FINDINGS: Study is slightly limited by patient motion. BRAIN: There is sulcal and ventricular prominence consistent with superficial and central atrophy. No intraparenchymal hemorrhage, mass effect nor midline shift. Periventricular and subcortical white matter hypodensities consistent with chronic small vessel ischemic disease are identified. No acute large vascular territory infarcts. No abnormal extra-axial fluid collections. Basal cisterns are not effaced and midline. VASCULAR: Mild-to-moderate calcific atherosclerosis of the carotid siphons. SKULL: No skull fracture. No significant scalp soft tissue swelling. SINUSES/ORBITS: The mastoid air-cells are clear. The included paranasal sinuses are well-aerated.The included ocular globes and orbital contents are non-suspicious. OTHER: None. IMPRESSION: Chronic appearing small vessel ischemic disease.  Cerebral atrophy. Electronically Signed   By: Ashley Royalty M.D.   On: 08/28/2016 22:40        Discharge Exam: Vitals:   09/04/16 1016 09/05/16 0552  BP: 129/72 (!) 110/51  Pulse: 77 69  Resp: 16 (!) 28  Temp: 97.8 F (36.6 C) 97.4 F (36.3 C)   Vitals:   09/02/16 1445 09/03/16 0900 09/04/16 1016 09/05/16 0552  BP: (!) 149/97 (!) 112/53 129/72 (!) 110/51  Pulse: 72 76 77 69  Resp: 18 20 16  (!) 28  Temp:  97.6 F (36.4 C) 97.8 F (36.6 C) 97.4 F (36.3 C)  TempSrc:  Oral Oral Axillary  SpO2: 96% (!) 86% 93% (!) 84%  Weight:      Height:        General: Pt is alert,  not in acute distress Cardiovascular: RRR, S1/S2 +, no rubs, no gallops Respiratory: bilateral crackles,  no wheeze Abdominal: Soft, NT, ND, bowel sounds + Extremities: trace edema, no cyanosis   The results of significant diagnostics from this hospitalization (including imaging, microbiology, ancillary and laboratory) are listed below for reference.    Significant Diagnostic Studies: Ct Abdomen Pelvis Wo Contrast  Result Date: 08/28/2016 CLINICAL DATA:  Altered mental status with fever. Daughter noticed blood in the patient's briefs. EXAM: CT ABDOMEN AND PELVIS WITHOUT CONTRAST TECHNIQUE: Multidetector CT imaging of the abdomen and pelvis was performed following the standard protocol without IV contrast. COMPARISON:  04/17/2010 pelvic CT FINDINGS: Lower chest: Borderline cardiomegaly without pericardial effusion. Large hiatal hernia. Patchy airspace opacities at each lung base. Pneumonia is not excluded. Tiny nodular densities noted in the left lower lobe measuring up to 5 mm. No pleural effusion or pneumothorax. Hepatobiliary: No focal liver abnormality is seen. No gallstones, gallbladder wall thickening, or biliary dilatation. Pancreas: No pancreatic mass or ductal dilatation. Due to motion artifacts, assessment for pancreatic inflammation is compromised. Spleen: Normal in size without focal abnormality. Adrenals/Urinary Tract: No adrenal nodule. Mild left adrenal gland thickening. No nephrolithiasis or renal mass. No hydroureteronephrosis. Distended bladder without focal mural abnormality or calculus. Stomach/Bowel: Normal small bowel rotation. No bowel obstruction. Moderate colonic stool burden. There is left-sided colonic diverticulosis. The appendix is not visualized. No definite cecal inflammation however. Vascular/Lymphatic: Aorto bi-iliac atherosclerosis without aneurysm. No lymphadenopathy. Reproductive: Uterus and bilateral adnexa are unremarkable. Other: No abdominal wall hernia or abnormality. No abdominopelvic ascites. Musculoskeletal: Chronic insufficiency fracture of the right sacral ala is not  well visualized on current exam. Osteoarthritis of the sacroiliac joints. Lower lumbar facet arthropathy. Dextroscoliosis of the thoracolumbar junction. No acute osseous appearing abnormality. IMPRESSION: 1. Large hiatal hernia. 2. Patchy airspace opacities within both visualized lower lobes. Pneumonia is not entirely excluded. Tiny 5 mm or less nodular densities in the medial left lower lobe adjacent atelectatic lung may be postinfectious or postinflammatory in etiology. Pulmonary nodules are not entirely excluded. No follow-up needed if patient is low-risk (and has no known or suspected primary neoplasm). Non-contrast chest CT can be considered in 12 months if patient is high-risk. This recommendation follows the consensus statement: Guidelines for Management of Incidental Pulmonary Nodules Detected on CT Images: From the Fleischner Society 2017; Radiology 2017; 284:228-243. 3. Colonic diverticulosis without acute diverticulitis. 4. Aorto bi-iliac atherosclerosis without aneurysm. Electronically Signed   By: Ashley Royalty M.D.   On: 08/28/2016 22:51   Dg Chest 2 View  Result Date: 08/28/2016 CLINICAL DATA:  81 y/o  F; altered mental status and fever. EXAM: CHEST  2 VIEW COMPARISON:  06/01/2014 chest radiograph. FINDINGS: Stable cardiac silhouette given projection and technique. And aortic atherosclerosis with calcification. Left shoulder reverse arthroplasty partially visualized. No acute osseous abnormality. Left lower lobe consolidation and streaky airspace opacities in left mid lung zone. IMPRESSION: Left lower lobe consolidation likely represents pneumonia. These results will be called to the ordering clinician or representative by the Radiologist Assistant, and communication documented in the PACS or zVision Dashboard. Electronically Signed   By: Kristine Garbe M.D.   On: 08/28/2016 18:01   Ct Head Wo Contrast  Result Date: 08/28/2016 CLINICAL DATA:  Altered mental status.  Nonverbal. EXAM: CT  HEAD WITHOUT CONTRAST TECHNIQUE: Contiguous axial images  were obtained from the base of the skull through the vertex without intravenous contrast. COMPARISON:  None. FINDINGS: Study is slightly limited by patient motion. BRAIN: There is sulcal and ventricular prominence consistent with superficial and central atrophy. No intraparenchymal hemorrhage, mass effect nor midline shift. Periventricular and subcortical white matter hypodensities consistent with chronic small vessel ischemic disease are identified. No acute large vascular territory infarcts. No abnormal extra-axial fluid collections. Basal cisterns are not effaced and midline. VASCULAR: Mild-to-moderate calcific atherosclerosis of the carotid siphons. SKULL: No skull fracture. No significant scalp soft tissue swelling. SINUSES/ORBITS: The mastoid air-cells are clear. The included paranasal sinuses are well-aerated.The included ocular globes and orbital contents are non-suspicious. OTHER: None. IMPRESSION: Chronic appearing small vessel ischemic disease.  Cerebral atrophy. Electronically Signed   By: Ashley Royalty M.D.   On: 08/28/2016 22:40     Microbiology: Recent Results (from the past 240 hour(s))  Blood Culture (routine x 2)     Status: Abnormal   Collection Time: 08/28/16  5:30 PM  Result Value Ref Range Status   Specimen Description BLOOD LEFT WRIST  Final   Special Requests BOTTLES DRAWN AEROBIC ONLY 5CC  Final   Culture  Setup Time   Final    GRAM NEGATIVE RODS AEROBIC BOTTLE ONLY CRITICAL VALUE NOTED.  VALUE IS CONSISTENT WITH PREVIOUSLY REPORTED AND CALLED VALUE.    Culture (A)  Final    KLEBSIELLA OXYTOCA SUSCEPTIBILITIES PERFORMED ON PREVIOUS CULTURE WITHIN THE LAST 5 DAYS. Performed at [facility], [place] [address], [place], [place] [address]    Report Status 08/31/2016 FINAL  Final  Blood Culture (routine x 2)     Status: Abnormal   Collection Time: 08/28/16  5:39 PM  Result Value Ref Range Status   Specimen  Description BLOOD RIGHT WRIST  Final   Special Requests BOTTLES DRAWN AEROBIC AND ANAEROBIC 5CC  Final   Culture  Setup Time   Final    GRAM NEGATIVE RODS IN BOTH AEROBIC AND ANAEROBIC BOTTLES CRITICAL RESULT CALLED TO, READ BACK BY AND VERIFIED WITH: J. Scherrie November Pharm.D. 11:15 08/29/16 ([email or web address]) Performed at [facility], [place] [address] [place],  [address]    Culture KLEBSIELLA OXYTOCA (A)  Final   Report Status 08/31/2016 FINAL  Final   Organism ID, Bacteria KLEBSIELLA OXYTOCA  Final      Susceptibility   Klebsiella oxytoca - MIC*    AMPICILLIN >=32 RESISTANT Resistant     CEFAZOLIN 8 SENSITIVE Sensitive     CEFEPIME <=1 SENSITIVE Sensitive     CEFTAZIDIME <=1 SENSITIVE Sensitive     CEFTRIAXONE <=1 SENSITIVE Sensitive     CIPROFLOXACIN <=0.25 SENSITIVE Sensitive     GENTAMICIN <=1 SENSITIVE Sensitive     IMIPENEM <=0.25 SENSITIVE Sensitive     TRIMETH/SULFA <=20 SENSITIVE Sensitive     AMPICILLIN/SULBACTAM 16 INTERMEDIATE Intermediate     PIP/TAZO <=4 SENSITIVE Sensitive     Extended ESBL NEGATIVE Sensitive     * KLEBSIELLA OXYTOCA  Blood Culture ID Panel (Reflexed)     Status: Abnormal   Collection Time: 08/28/16  5:39 PM  Result Value Ref Range Status   Enterococcus species NOT DETECTED NOT DETECTED Final   Listeria monocytogenes NOT DETECTED NOT DETECTED Final   Staphylococcus species NOT DETECTED NOT DETECTED Final   Staphylococcus aureus NOT DETECTED NOT DETECTED Final   Streptococcus species NOT DETECTED NOT DETECTED Final   Streptococcus agalactiae NOT DETECTED NOT DETECTED Final   Streptococcus pneumoniae NOT DETECTED  NOT DETECTED Final   Streptococcus pyogenes NOT DETECTED NOT DETECTED Final   Acinetobacter baumannii NOT DETECTED NOT DETECTED Final   Enterobacteriaceae species DETECTED (A) NOT DETECTED Final    Comment: Enterobacteriaceae represent a large family of gram-negative bacteria, not a single organism. CRITICAL RESULT CALLED TO, READ BACK BY AND  VERIFIED WITH: J. Scherrie November Pharm.D. 11:15 08/29/16 ([email or web address])    Enterobacter cloacae complex NOT DETECTED NOT DETECTED Final   Escherichia coli NOT DETECTED NOT DETECTED Final   Klebsiella oxytoca DETECTED (A) NOT DETECTED Final    Comment: CRITICAL RESULT CALLED TO, READ BACK BY AND VERIFIED WITH: J. Scherrie November Pharm.D. 11:15 08/29/16 ([email or web address])    Klebsiella pneumoniae NOT DETECTED NOT DETECTED Final   Proteus species NOT DETECTED NOT DETECTED Final   Serratia marcescens NOT DETECTED NOT DETECTED Final   Carbapenem resistance NOT DETECTED NOT DETECTED Final   Haemophilus influenzae NOT DETECTED NOT DETECTED Final   Neisseria meningitidis NOT DETECTED NOT DETECTED Final   Pseudomonas aeruginosa NOT DETECTED NOT DETECTED Final   Candida albicans NOT DETECTED NOT DETECTED Final   Candida glabrata NOT DETECTED NOT DETECTED Final   Candida krusei NOT DETECTED NOT DETECTED Final   Candida parapsilosis NOT DETECTED NOT DETECTED Final   Candida tropicalis NOT DETECTED NOT DETECTED Final    Comment: Performed at [facility], [place] [address] [place], [place] [address]  Culture, Urine     Status: None   Collection Time: 08/28/16 10:33 PM  Result Value Ref Range Status   Specimen Description URINE, CLEAN CATCH  Final   Special Requests NONE  Final   Culture   Final    NO GROWTH Performed at [facility], [place] [address] [place],  [address]    Report Status 08/30/2016 FINAL  Final  MRSA PCR Screening     Status: None   Collection Time: 08/29/16 12:17 PM  Result Value Ref Range Status   MRSA by PCR NEGATIVE NEGATIVE Final    Comment:        The GeneXpert MRSA Assay (FDA approved for NASAL specimens only), is one component of a comprehensive MRSA colonization surveillance program. It is not intended to diagnose MRSA infection nor to guide or monitor treatment for MRSA infections.      Labs: Basic Metabolic Panel:  Recent Labs Lab 08/30/16 0801 08/30/16 2319  08/31/16 0346 09/01/16 0331 09/02/16 0400  NA 139 139 137 142 145  K 3.7 3.1* 3.4* 3.3* 2.9*  CL 108 110 110 113* 116*  CO2 14* 17* 16* 16* 18*  GLUCOSE 109* 148* 122* 156* 152*  BUN 47* 56* 55* 58* 54*  CREATININE 1.83* 1.77* 1.65* 1.49* 1.25*  CALCIUM 8.1* 8.1* 8.0* 8.9 8.8*  MG  --  1.6* 2.1  --   --    Liver Function Tests: No results for input(s): AST, ALT, ALKPHOS, BILITOT, PROT, ALBUMIN in the last 168 hours. No results for input(s): LIPASE, AMYLASE in the last 168 hours. No results for input(s): AMMONIA in the last 168 hours. CBC:  Recent Labs Lab 08/30/16 0350 08/31/16 0346 09/01/16 0331 09/02/16 0400  WBC 6.9 1.7* 3.3* 3.6*  NEUTROABS  --  0.0*  --   --   HGB 7.7* 6.9* 10.9* 11.2*  HCT 22.4* 19.4* 30.5* 31.0*  MCV 92.9 88.2 86.2 86.4  PLT 6* 28* 18* 6*   Cardiac Enzymes:  Recent Labs Lab 08/30/16 2319 08/31/16 0346 08/31/16 0713  TROPONINI 9.19* 6.89* 4.70*   BNP: Invalid  input(s): POCBNP CBG:  Recent Labs Lab 08/30/16 0453 08/30/16 0509 08/30/16 0618 08/30/16 0746  GLUCAP 39* 89 80 79    Time coordinating discharge:  Greater than 30 minutes  Signed:  Rafi Kenneth, DO [facility] Pager: [phone] 09/05/2016, 9:16 AM

## 2016-09-05 NOTE — Progress Notes (Cosign Needed)
Pyxis saying I need to waste morphine, order was for morphine 4 mgs subcutaneous , not 1 mg

## 2016-09-05 NOTE — Progress Notes (Signed)
Report called to [facility].  

## 2016-09-09 ENCOUNTER — Other Ambulatory Visit: Payer: Medicare Other

## 2016-09-09 ENCOUNTER — Ambulatory Visit: Payer: Medicare Other | Admitting: Oncology

## 2016-09-22 DEATH — deceased

## 2017-04-11 NOTE — Telephone Encounter (Signed)
No entry 

## 2018-04-23 IMAGING — CT CT ABD-PELV W/O CM
2 of 4 series · 15 of 46 positions shown, 17 images · non-contrast
Comparison: 04/17/2010 pelvic CT

CLINICAL DATA: Altered mental status with fever. Daughter noticed
blood in the patient's briefs.

EXAM:
CT ABDOMEN AND PELVIS WITHOUT CONTRAST
TECHNIQUE: Multidetector CT imaging of the abdomen and pelvis was performed
following the standard protocol without IV contrast.

[Series 2: abd/pel w/o · axial · non-contrast · 0.74mm/px · z∈[+835,+1170]mm · 12 of 77 slices shown, 14 images]
[im 5/77  soft-tissue]
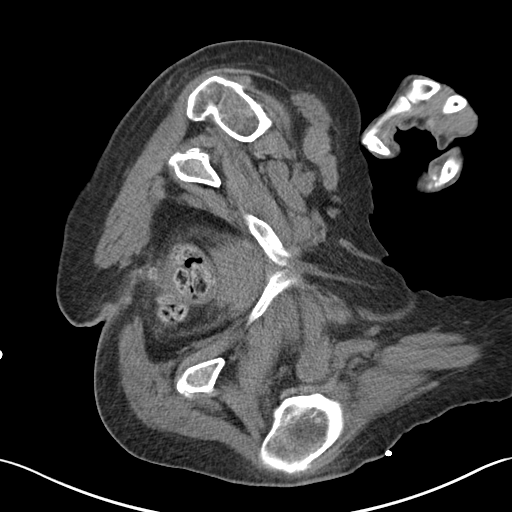
[im 5/77  bone]
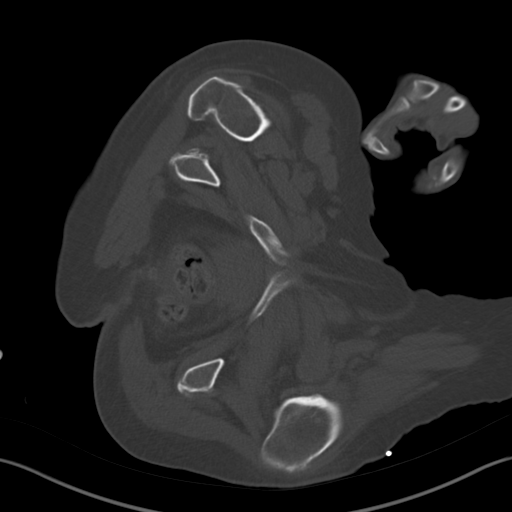
[im 10/77  soft-tissue]
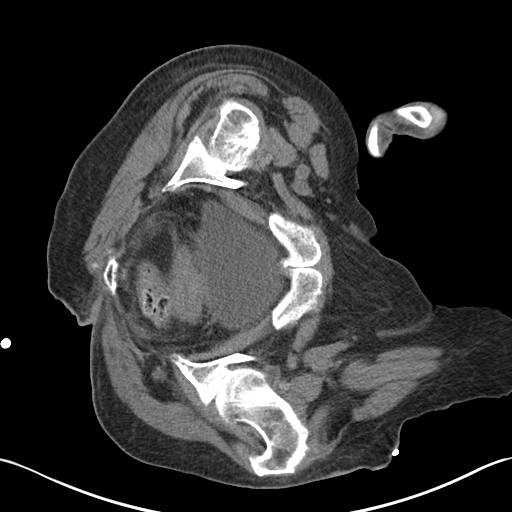
[im 20/77  soft-tissue]
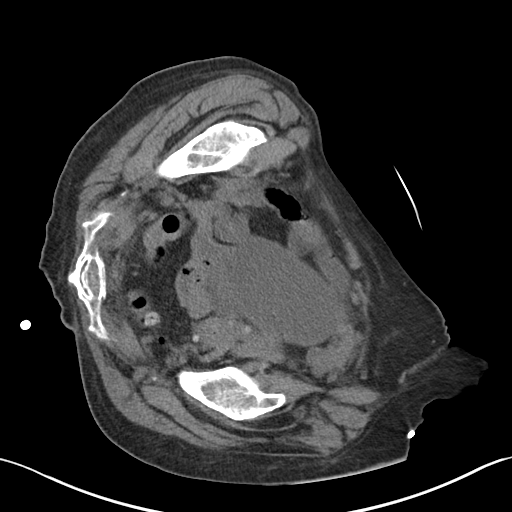
[im 24/77  soft-tissue]
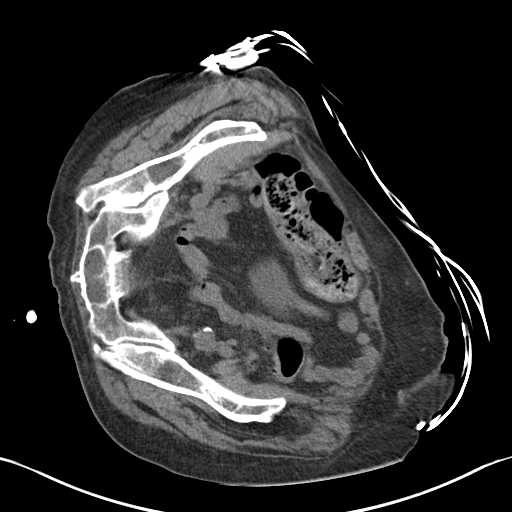
[im 29/77  soft-tissue]
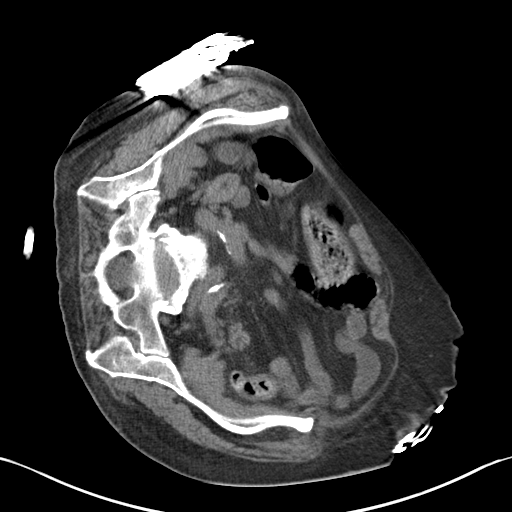
[im 34/77  soft-tissue]
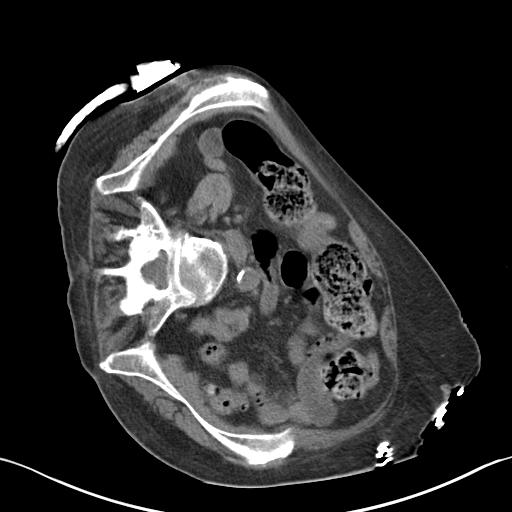
[im 43/77  soft-tissue]
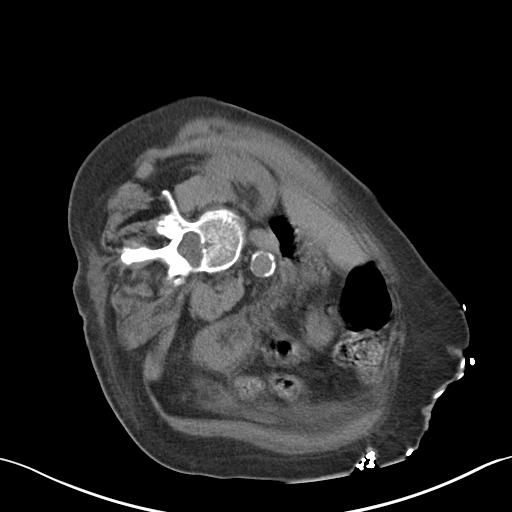
[im 48/77  soft-tissue]
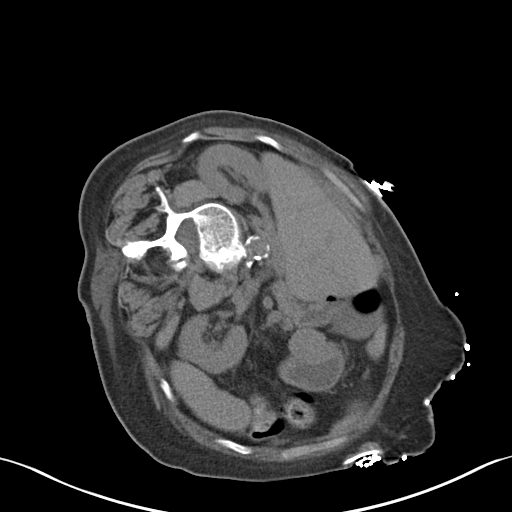
[im 53/77  soft-tissue]
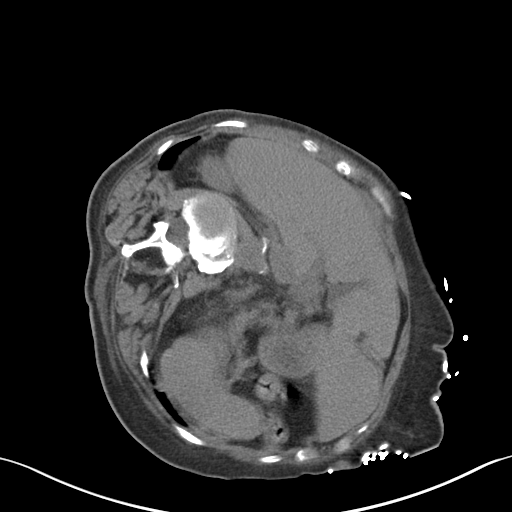
[im 53/77  bone]
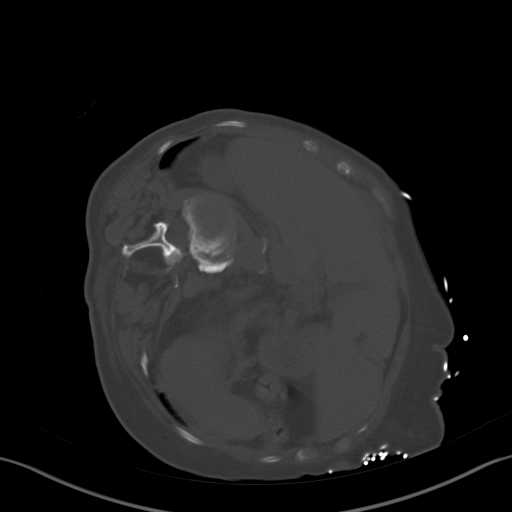
[im 58/77  soft-tissue]
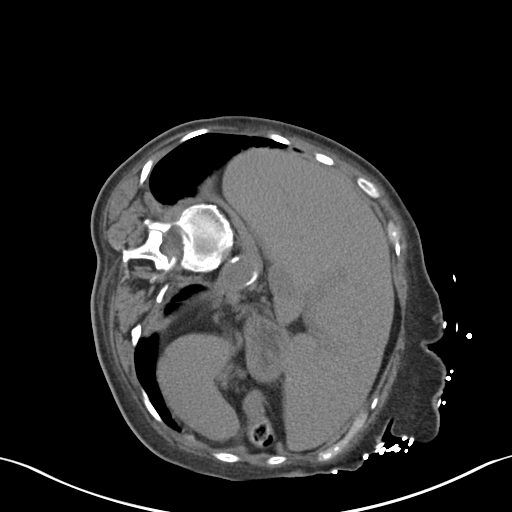
[im 67/77  soft-tissue]
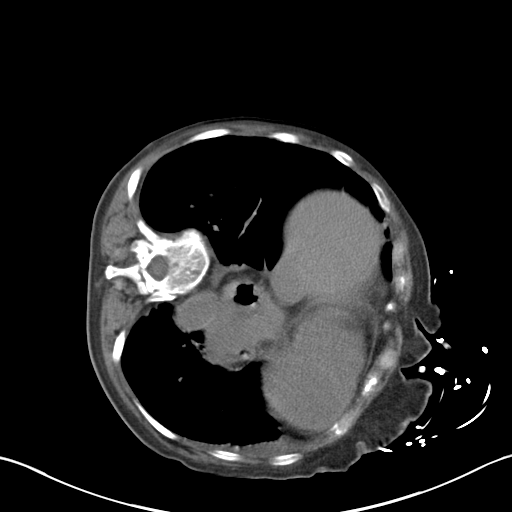
[im 72/77  soft-tissue]
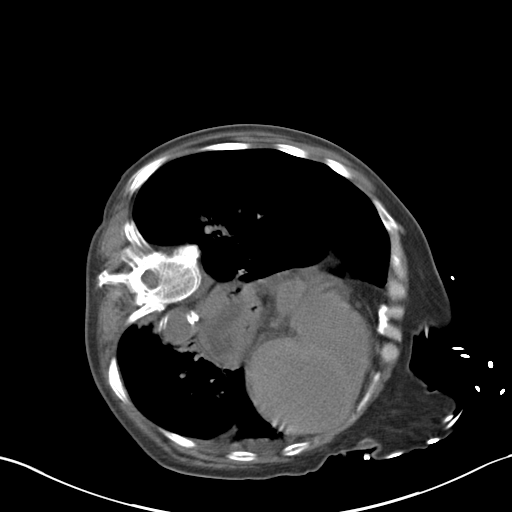

[Series 4: sagittal · coronal · 0.58mm/px · 3 of 191 slices shown]
[im 64/191  soft-tissue]
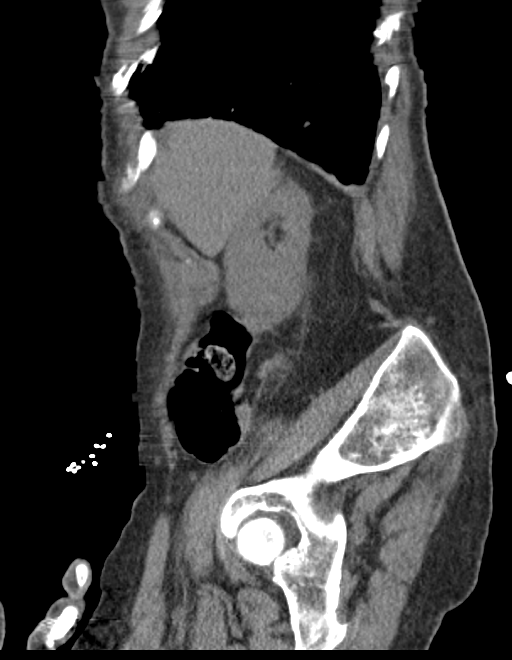
[im 85/191  soft-tissue]
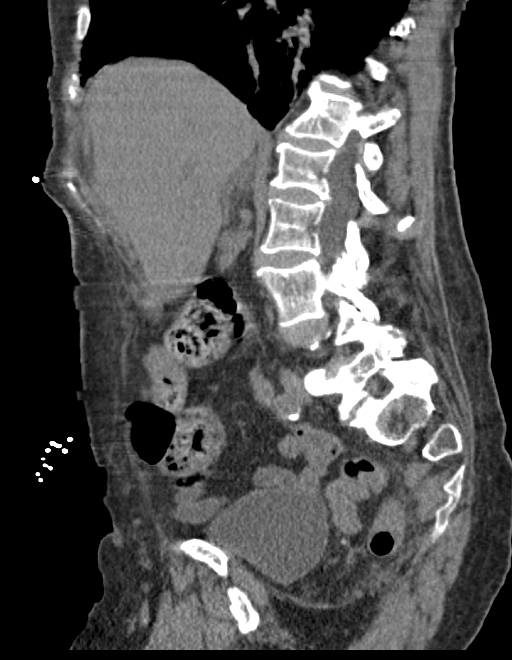
[im 106/191  soft-tissue]
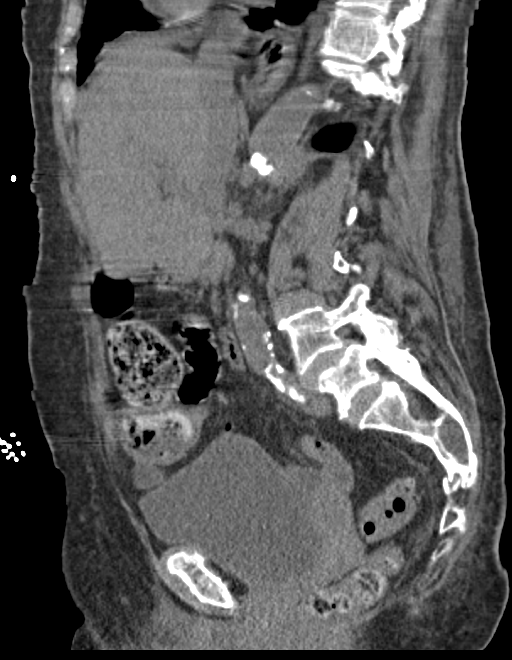

[15 of 46 positions shown; findings below may reference images not displayed]

FINDINGS: Lower chest: Borderline cardiomegaly without pericardial effusion.
Large hiatal hernia. Patchy airspace opacities at each lung base.
Pneumonia is not excluded. Tiny nodular densities noted in the left
lower lobe measuring up to 5 mm. No pleural effusion or
pneumothorax.

Hepatobiliary: No focal liver abnormality is seen. No gallstones,
gallbladder wall thickening, or biliary dilatation.

Pancreas: No pancreatic mass or ductal dilatation. Due to motion
artifacts, assessment for pancreatic inflammation is compromised.

Spleen: Normal in size without focal abnormality.

Adrenals/Urinary Tract: No adrenal nodule. Mild left adrenal gland
thickening. No nephrolithiasis or renal mass. No
hydroureteronephrosis. Distended bladder without focal mural
abnormality or calculus.

Stomach/Bowel: Normal small bowel rotation. No bowel obstruction.
Moderate colonic stool burden. There is left-sided colonic
diverticulosis. The appendix is not visualized. No definite cecal
inflammation however.

Vascular/Lymphatic: Aorto bi-iliac atherosclerosis without aneurysm.
No lymphadenopathy.

Reproductive: Uterus and bilateral adnexa are unremarkable.

Other: No abdominal wall hernia or abnormality. No abdominopelvic
ascites.

Musculoskeletal: Chronic insufficiency fracture of the right sacral
ala is not well visualized on current exam. Osteoarthritis of the
sacroiliac joints. Lower lumbar facet arthropathy. Dextroscoliosis
of the thoracolumbar junction. No acute osseous appearing
abnormality.
IMPRESSION: 1. Large hiatal hernia.
2. Patchy airspace opacities within both visualized lower lobes.
Pneumonia is not entirely excluded. Tiny 5 mm or less nodular
densities in the medial left lower lobe adjacent atelectatic lung
may be postinfectious or postinflammatory in etiology. Pulmonary
nodules are not entirely excluded. No follow-up needed if patient is
low-risk (and has no known or suspected primary neoplasm).
Non-contrast chest CT can be considered in 12 months if patient is
high-risk. This recommendation follows the consensus statement:
Guidelines for Management of Incidental Pulmonary Nodules Detected
[DATE].
3. Colonic diverticulosis without acute diverticulitis.
4. Aorto bi-iliac atherosclerosis without aneurysm.

## 2018-04-23 IMAGING — CR DG CHEST 2V
2 series · 2 of 2 positions shown · non-contrast
Comparison: 06/01/2014 chest radiograph.

CLINICAL DATA: 80 y/o  F; altered mental status and fever.

EXAM:
CHEST  2 VIEW

[w chest lat]
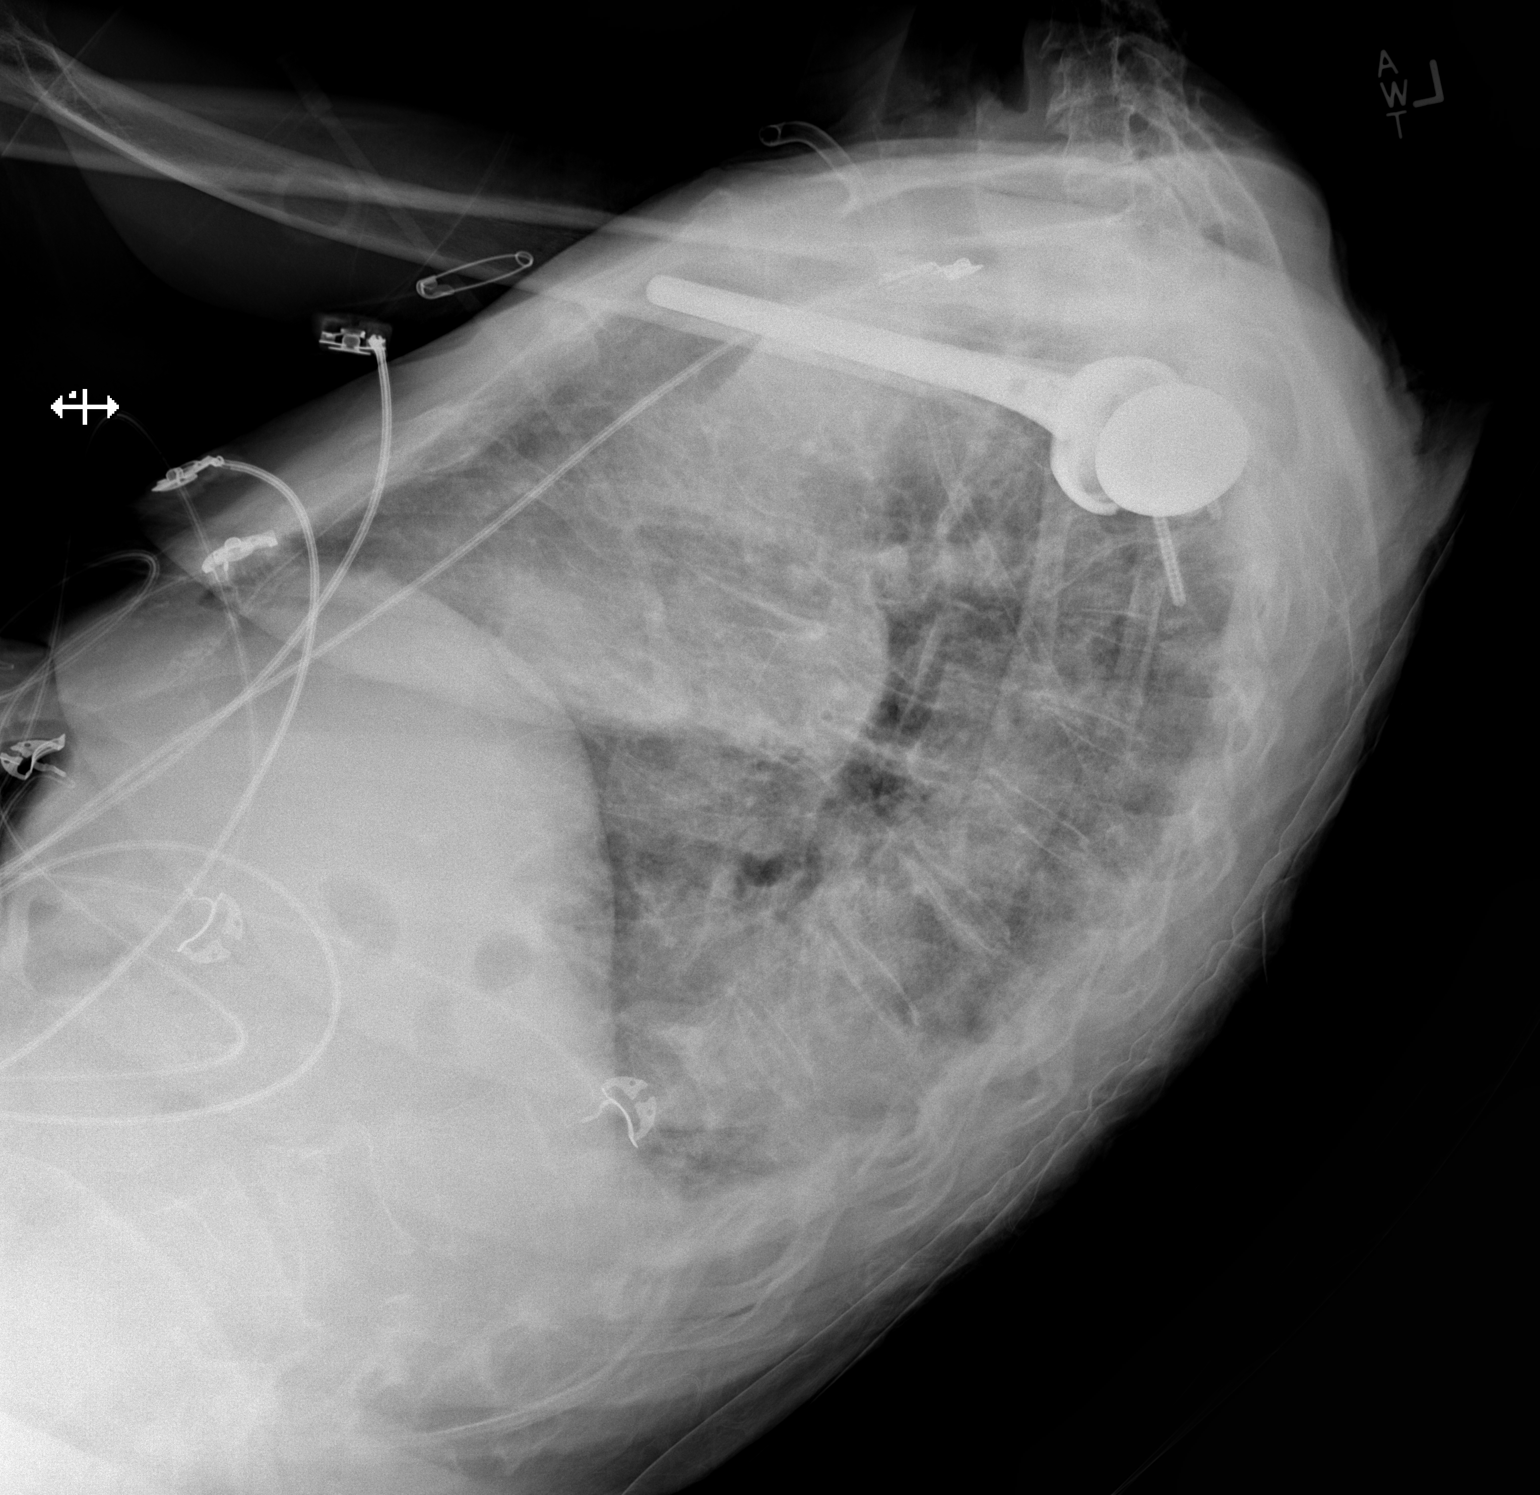

[x chest ap]
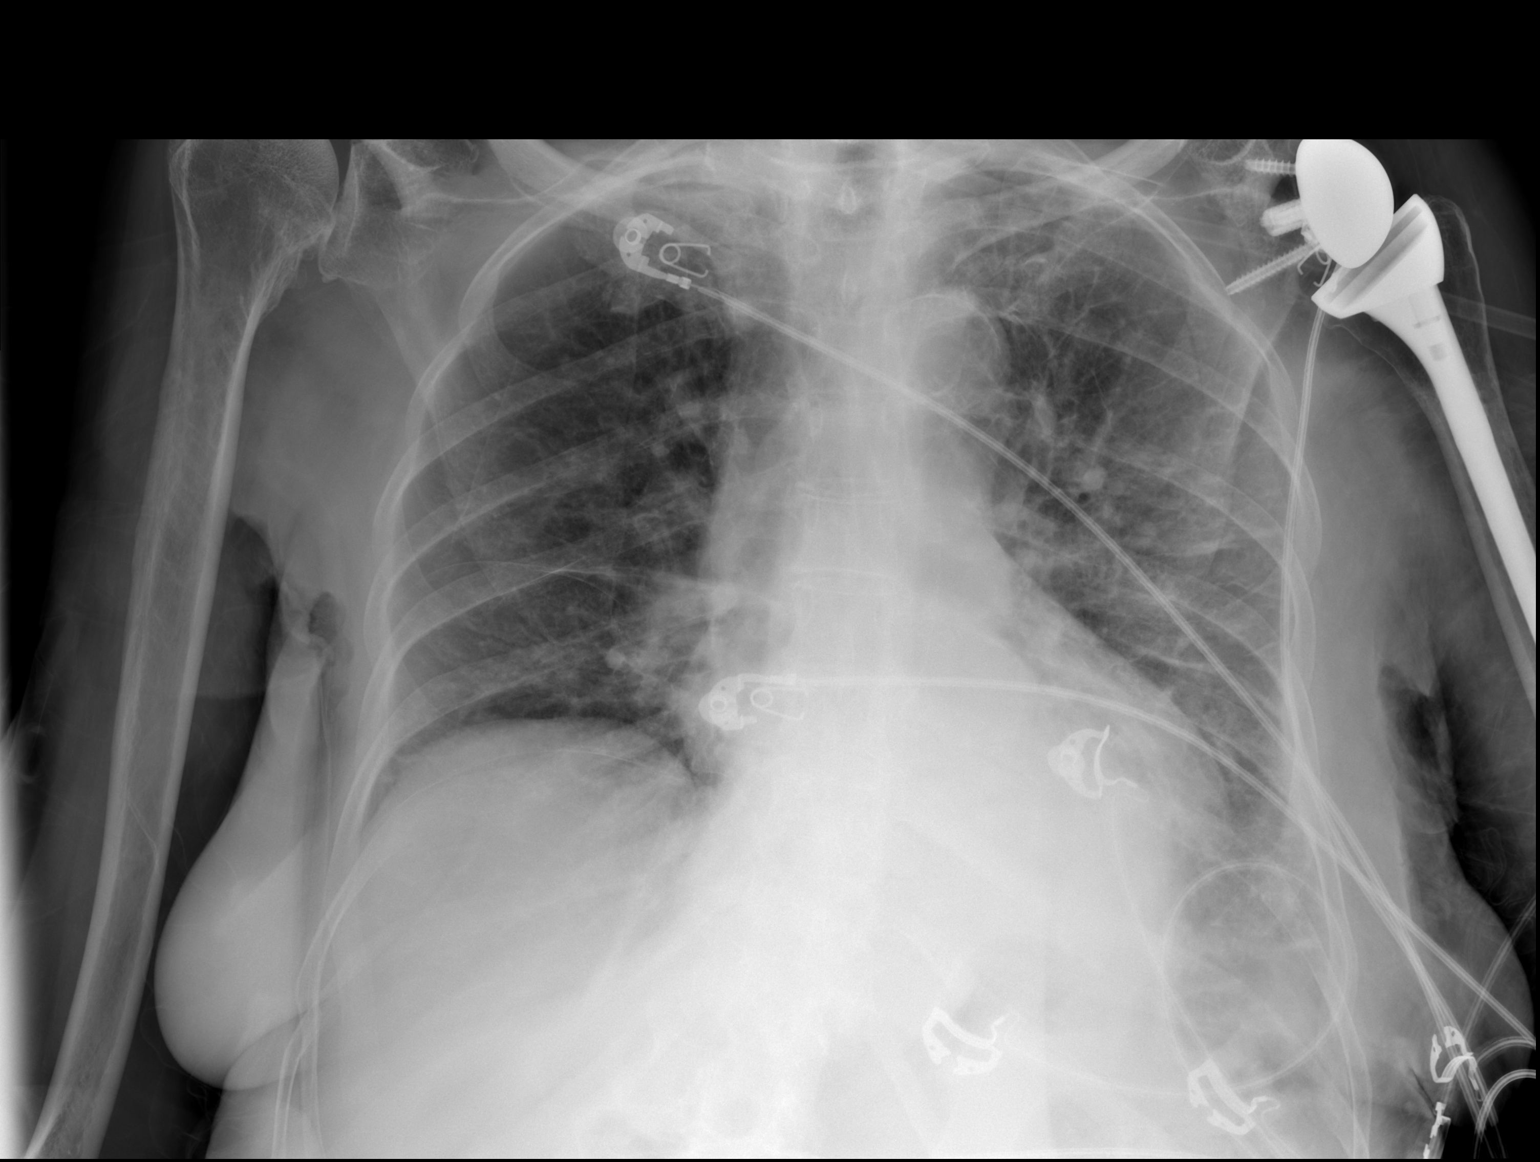

[2 of 2 positions shown; findings below may reference images not displayed]

FINDINGS: Stable cardiac silhouette given projection and technique. And aortic
atherosclerosis with calcification. Left shoulder reverse
arthroplasty partially visualized. No acute osseous abnormality.
Left lower lobe consolidation and streaky airspace opacities in left
mid lung zone.
IMPRESSION: Left lower lobe consolidation likely represents pneumonia.

These results will be called to the ordering clinician or
representative by the Radiologist Assistant, and communication
documented in the PACS or zVision Dashboard.

By: Tc Recai Oda M.D.
